# Patient Record
Sex: Male | Born: 1945 | Race: White | Hispanic: No | Marital: Married | Smoking: Former smoker
Health system: Southern US, Community
[De-identification: ages and names within clinical notes are randomized; demographics above are authoritative.]

## PROBLEM LIST (undated history)

## (undated) DIAGNOSIS — H25013 Cortical age-related cataract, bilateral: Secondary | ICD-10-CM

## (undated) DIAGNOSIS — J189 Pneumonia, unspecified organism: Secondary | ICD-10-CM

## (undated) DIAGNOSIS — M1990 Unspecified osteoarthritis, unspecified site: Secondary | ICD-10-CM

## (undated) DIAGNOSIS — J9811 Atelectasis: Secondary | ICD-10-CM

## (undated) DIAGNOSIS — E119 Type 2 diabetes mellitus without complications: Secondary | ICD-10-CM

## (undated) DIAGNOSIS — N189 Chronic kidney disease, unspecified: Secondary | ICD-10-CM

## (undated) DIAGNOSIS — E785 Hyperlipidemia, unspecified: Secondary | ICD-10-CM

## (undated) DIAGNOSIS — K5790 Diverticulosis of intestine, part unspecified, without perforation or abscess without bleeding: Secondary | ICD-10-CM

## (undated) DIAGNOSIS — B353 Tinea pedis: Secondary | ICD-10-CM

## (undated) DIAGNOSIS — N184 Chronic kidney disease, stage 4 (severe): Secondary | ICD-10-CM

## (undated) DIAGNOSIS — Z87442 Personal history of urinary calculi: Secondary | ICD-10-CM

## (undated) DIAGNOSIS — I10 Essential (primary) hypertension: Secondary | ICD-10-CM

## (undated) DIAGNOSIS — C44311 Basal cell carcinoma of skin of nose: Secondary | ICD-10-CM

## (undated) DIAGNOSIS — K219 Gastro-esophageal reflux disease without esophagitis: Secondary | ICD-10-CM

## (undated) DIAGNOSIS — L02212 Cutaneous abscess of back [any part, except buttock]: Secondary | ICD-10-CM

## (undated) HISTORY — PX: EYE SURGERY: SHX253

## (undated) HISTORY — PX: COLONOSCOPY: SHX174

## (undated) HISTORY — PX: VASECTOMY: SHX75

## (undated) HISTORY — PX: COLON SURGERY: SHX602

---

## 1968-01-31 HISTORY — PX: PILONIDAL CYST EXCISION: SHX744

## 2006-01-30 HISTORY — PX: PARTIAL COLECTOMY: SHX5273

## 2006-01-30 HISTORY — PX: APPENDECTOMY: SHX54

## 2006-06-07 ENCOUNTER — Ambulatory Visit: Payer: Self-pay | Admitting: Gastroenterology

## 2006-07-10 ENCOUNTER — Ambulatory Visit: Payer: Self-pay | Admitting: Surgery

## 2006-07-10 ENCOUNTER — Other Ambulatory Visit: Payer: Self-pay

## 2006-07-16 ENCOUNTER — Inpatient Hospital Stay: Payer: Self-pay | Admitting: Surgery

## 2007-08-26 ENCOUNTER — Ambulatory Visit: Payer: Self-pay | Admitting: Gastroenterology

## 2009-02-25 ENCOUNTER — Ambulatory Visit: Payer: Self-pay | Admitting: Gastroenterology

## 2009-09-28 ENCOUNTER — Ambulatory Visit: Payer: Self-pay | Admitting: Ophthalmology

## 2009-09-30 HISTORY — PX: CATARACT EXTRACTION: SUR2

## 2009-10-11 ENCOUNTER — Ambulatory Visit: Payer: Self-pay | Admitting: Ophthalmology

## 2010-05-01 HISTORY — PX: CATARACT EXTRACTION: SUR2

## 2010-05-09 ENCOUNTER — Ambulatory Visit: Payer: Self-pay | Admitting: Ophthalmology

## 2011-01-05 ENCOUNTER — Emergency Department: Payer: Self-pay | Admitting: Emergency Medicine

## 2011-04-17 ENCOUNTER — Ambulatory Visit: Payer: Self-pay | Admitting: Gastroenterology

## 2011-04-18 LAB — PATHOLOGY REPORT

## 2013-01-31 DIAGNOSIS — I10 Essential (primary) hypertension: Secondary | ICD-10-CM | POA: Diagnosis not present

## 2013-01-31 DIAGNOSIS — E119 Type 2 diabetes mellitus without complications: Secondary | ICD-10-CM | POA: Diagnosis not present

## 2013-02-07 DIAGNOSIS — I10 Essential (primary) hypertension: Secondary | ICD-10-CM | POA: Diagnosis not present

## 2013-02-08 ENCOUNTER — Emergency Department: Payer: Self-pay | Admitting: Emergency Medicine

## 2013-02-08 DIAGNOSIS — Z79899 Other long term (current) drug therapy: Secondary | ICD-10-CM | POA: Diagnosis not present

## 2013-02-08 DIAGNOSIS — Z7982 Long term (current) use of aspirin: Secondary | ICD-10-CM | POA: Diagnosis not present

## 2013-02-08 DIAGNOSIS — E119 Type 2 diabetes mellitus without complications: Secondary | ICD-10-CM | POA: Diagnosis not present

## 2013-02-08 DIAGNOSIS — Z888 Allergy status to other drugs, medicaments and biological substances status: Secondary | ICD-10-CM | POA: Diagnosis not present

## 2013-02-08 DIAGNOSIS — I10 Essential (primary) hypertension: Secondary | ICD-10-CM | POA: Diagnosis not present

## 2013-02-08 LAB — COMPREHENSIVE METABOLIC PANEL
ALK PHOS: 72 U/L
ANION GAP: 5 — AB (ref 7–16)
Albumin: 3.7 g/dL (ref 3.4–5.0)
BUN: 16 mg/dL (ref 7–18)
Bilirubin,Total: 0.3 mg/dL (ref 0.2–1.0)
CALCIUM: 8.9 mg/dL (ref 8.5–10.1)
CO2: 24 mmol/L (ref 21–32)
CREATININE: 1.04 mg/dL (ref 0.60–1.30)
Chloride: 107 mmol/L (ref 98–107)
Glucose: 179 mg/dL — ABNORMAL HIGH (ref 65–99)
Osmolality: 278 (ref 275–301)
Potassium: 4 mmol/L (ref 3.5–5.1)
SGOT(AST): 24 U/L (ref 15–37)
SGPT (ALT): 27 U/L (ref 12–78)
SODIUM: 136 mmol/L (ref 136–145)
Total Protein: 7.2 g/dL (ref 6.4–8.2)

## 2013-02-08 LAB — CBC WITH DIFFERENTIAL/PLATELET
Basophil #: 0 10*3/uL (ref 0.0–0.1)
Basophil %: 0.2 %
Eosinophil #: 0.2 10*3/uL (ref 0.0–0.7)
Eosinophil %: 1.8 %
HCT: 46.2 % (ref 40.0–52.0)
HGB: 16 g/dL (ref 13.0–18.0)
Lymphocyte #: 2 10*3/uL (ref 1.0–3.6)
Lymphocyte %: 18.5 %
MCH: 31.3 pg (ref 26.0–34.0)
MCHC: 34.5 g/dL (ref 32.0–36.0)
MCV: 91 fL (ref 80–100)
Monocyte #: 1 x10 3/mm (ref 0.2–1.0)
Monocyte %: 8.7 %
NEUTROS ABS: 7.7 10*3/uL — AB (ref 1.4–6.5)
NEUTROS PCT: 70.8 %
Platelet: 269 10*3/uL (ref 150–440)
RBC: 5.1 10*6/uL (ref 4.40–5.90)
RDW: 13.1 % (ref 11.5–14.5)
WBC: 10.9 10*3/uL — AB (ref 3.8–10.6)

## 2013-02-08 LAB — TROPONIN I

## 2013-02-08 LAB — CK TOTAL AND CKMB (NOT AT ARMC)
CK, TOTAL: 61 U/L (ref 35–232)
CK-MB: 0.9 ng/mL (ref 0.5–3.6)

## 2013-02-12 DIAGNOSIS — E119 Type 2 diabetes mellitus without complications: Secondary | ICD-10-CM | POA: Diagnosis not present

## 2013-02-12 DIAGNOSIS — I10 Essential (primary) hypertension: Secondary | ICD-10-CM | POA: Diagnosis not present

## 2013-02-27 DIAGNOSIS — I10 Essential (primary) hypertension: Secondary | ICD-10-CM | POA: Diagnosis not present

## 2013-02-27 DIAGNOSIS — R002 Palpitations: Secondary | ICD-10-CM | POA: Diagnosis not present

## 2013-03-26 DIAGNOSIS — E119 Type 2 diabetes mellitus without complications: Secondary | ICD-10-CM | POA: Diagnosis not present

## 2013-04-01 DIAGNOSIS — I10 Essential (primary) hypertension: Secondary | ICD-10-CM | POA: Diagnosis not present

## 2013-04-01 DIAGNOSIS — E119 Type 2 diabetes mellitus without complications: Secondary | ICD-10-CM | POA: Diagnosis not present

## 2013-05-14 DIAGNOSIS — I10 Essential (primary) hypertension: Secondary | ICD-10-CM | POA: Diagnosis not present

## 2013-05-14 DIAGNOSIS — Z0000 Encounter for general adult medical examination without abnormal findings: Secondary | ICD-10-CM | POA: Diagnosis not present

## 2013-05-14 DIAGNOSIS — E119 Type 2 diabetes mellitus without complications: Secondary | ICD-10-CM | POA: Diagnosis not present

## 2013-05-16 DIAGNOSIS — E119 Type 2 diabetes mellitus without complications: Secondary | ICD-10-CM | POA: Diagnosis not present

## 2013-05-21 DIAGNOSIS — Z0000 Encounter for general adult medical examination without abnormal findings: Secondary | ICD-10-CM | POA: Diagnosis not present

## 2013-05-21 DIAGNOSIS — I10 Essential (primary) hypertension: Secondary | ICD-10-CM | POA: Diagnosis not present

## 2013-05-21 DIAGNOSIS — R12 Heartburn: Secondary | ICD-10-CM | POA: Diagnosis not present

## 2013-05-21 DIAGNOSIS — E119 Type 2 diabetes mellitus without complications: Secondary | ICD-10-CM | POA: Diagnosis not present

## 2013-07-02 DIAGNOSIS — E119 Type 2 diabetes mellitus without complications: Secondary | ICD-10-CM | POA: Diagnosis not present

## 2013-07-09 DIAGNOSIS — R110 Nausea: Secondary | ICD-10-CM | POA: Diagnosis not present

## 2013-07-09 DIAGNOSIS — R109 Unspecified abdominal pain: Secondary | ICD-10-CM | POA: Diagnosis not present

## 2013-07-09 DIAGNOSIS — E119 Type 2 diabetes mellitus without complications: Secondary | ICD-10-CM | POA: Diagnosis not present

## 2013-08-06 DIAGNOSIS — I10 Essential (primary) hypertension: Secondary | ICD-10-CM | POA: Diagnosis not present

## 2013-08-06 DIAGNOSIS — R21 Rash and other nonspecific skin eruption: Secondary | ICD-10-CM | POA: Diagnosis not present

## 2013-08-06 DIAGNOSIS — E119 Type 2 diabetes mellitus without complications: Secondary | ICD-10-CM | POA: Diagnosis not present

## 2013-10-03 DIAGNOSIS — E119 Type 2 diabetes mellitus without complications: Secondary | ICD-10-CM | POA: Diagnosis not present

## 2013-10-10 DIAGNOSIS — IMO0001 Reserved for inherently not codable concepts without codable children: Secondary | ICD-10-CM | POA: Diagnosis not present

## 2013-12-08 DIAGNOSIS — E119 Type 2 diabetes mellitus without complications: Secondary | ICD-10-CM | POA: Diagnosis not present

## 2013-12-08 DIAGNOSIS — I10 Essential (primary) hypertension: Secondary | ICD-10-CM | POA: Diagnosis not present

## 2013-12-15 DIAGNOSIS — Z23 Encounter for immunization: Secondary | ICD-10-CM | POA: Diagnosis not present

## 2013-12-30 DIAGNOSIS — E119 Type 2 diabetes mellitus without complications: Secondary | ICD-10-CM | POA: Diagnosis not present

## 2013-12-30 DIAGNOSIS — I10 Essential (primary) hypertension: Secondary | ICD-10-CM | POA: Diagnosis not present

## 2014-01-12 DIAGNOSIS — N183 Chronic kidney disease, stage 3 (moderate): Secondary | ICD-10-CM | POA: Diagnosis not present

## 2014-01-12 DIAGNOSIS — E119 Type 2 diabetes mellitus without complications: Secondary | ICD-10-CM | POA: Diagnosis not present

## 2014-01-12 DIAGNOSIS — I10 Essential (primary) hypertension: Secondary | ICD-10-CM | POA: Diagnosis not present

## 2014-01-12 DIAGNOSIS — Z794 Long term (current) use of insulin: Secondary | ICD-10-CM | POA: Diagnosis not present

## 2014-01-12 DIAGNOSIS — E1165 Type 2 diabetes mellitus with hyperglycemia: Secondary | ICD-10-CM | POA: Diagnosis not present

## 2014-04-10 DIAGNOSIS — E1165 Type 2 diabetes mellitus with hyperglycemia: Secondary | ICD-10-CM | POA: Diagnosis not present

## 2014-04-17 DIAGNOSIS — E119 Type 2 diabetes mellitus without complications: Secondary | ICD-10-CM | POA: Diagnosis not present

## 2014-06-25 DIAGNOSIS — E119 Type 2 diabetes mellitus without complications: Secondary | ICD-10-CM | POA: Diagnosis not present

## 2014-07-10 DIAGNOSIS — E119 Type 2 diabetes mellitus without complications: Secondary | ICD-10-CM | POA: Diagnosis not present

## 2014-07-10 DIAGNOSIS — I10 Essential (primary) hypertension: Secondary | ICD-10-CM | POA: Diagnosis not present

## 2014-07-15 DIAGNOSIS — I10 Essential (primary) hypertension: Secondary | ICD-10-CM | POA: Diagnosis not present

## 2014-07-15 DIAGNOSIS — R635 Abnormal weight gain: Secondary | ICD-10-CM | POA: Diagnosis not present

## 2014-07-15 DIAGNOSIS — E119 Type 2 diabetes mellitus without complications: Secondary | ICD-10-CM | POA: Diagnosis not present

## 2014-07-15 DIAGNOSIS — Z794 Long term (current) use of insulin: Secondary | ICD-10-CM | POA: Diagnosis not present

## 2014-07-17 DIAGNOSIS — E119 Type 2 diabetes mellitus without complications: Secondary | ICD-10-CM | POA: Diagnosis not present

## 2014-07-17 DIAGNOSIS — I10 Essential (primary) hypertension: Secondary | ICD-10-CM | POA: Diagnosis not present

## 2014-07-17 DIAGNOSIS — Z0000 Encounter for general adult medical examination without abnormal findings: Secondary | ICD-10-CM | POA: Diagnosis not present

## 2014-07-17 DIAGNOSIS — E780 Pure hypercholesterolemia: Secondary | ICD-10-CM | POA: Diagnosis not present

## 2014-07-17 DIAGNOSIS — Z23 Encounter for immunization: Secondary | ICD-10-CM | POA: Diagnosis not present

## 2014-07-17 DIAGNOSIS — N183 Chronic kidney disease, stage 3 (moderate): Secondary | ICD-10-CM | POA: Diagnosis not present

## 2014-10-08 DIAGNOSIS — H43812 Vitreous degeneration, left eye: Secondary | ICD-10-CM | POA: Diagnosis not present

## 2014-11-12 DIAGNOSIS — E119 Type 2 diabetes mellitus without complications: Secondary | ICD-10-CM | POA: Diagnosis not present

## 2014-11-20 DIAGNOSIS — E119 Type 2 diabetes mellitus without complications: Secondary | ICD-10-CM | POA: Diagnosis not present

## 2014-11-20 DIAGNOSIS — Z794 Long term (current) use of insulin: Secondary | ICD-10-CM | POA: Diagnosis not present

## 2014-11-20 DIAGNOSIS — Z23 Encounter for immunization: Secondary | ICD-10-CM | POA: Diagnosis not present

## 2014-11-20 DIAGNOSIS — I10 Essential (primary) hypertension: Secondary | ICD-10-CM | POA: Diagnosis not present

## 2014-12-11 DIAGNOSIS — H43812 Vitreous degeneration, left eye: Secondary | ICD-10-CM | POA: Diagnosis not present

## 2015-01-01 DIAGNOSIS — D1801 Hemangioma of skin and subcutaneous tissue: Secondary | ICD-10-CM | POA: Diagnosis not present

## 2015-01-01 DIAGNOSIS — Q828 Other specified congenital malformations of skin: Secondary | ICD-10-CM | POA: Diagnosis not present

## 2015-01-01 DIAGNOSIS — L821 Other seborrheic keratosis: Secondary | ICD-10-CM | POA: Diagnosis not present

## 2015-01-01 DIAGNOSIS — D485 Neoplasm of uncertain behavior of skin: Secondary | ICD-10-CM | POA: Diagnosis not present

## 2015-01-01 DIAGNOSIS — L570 Actinic keratosis: Secondary | ICD-10-CM | POA: Diagnosis not present

## 2015-01-08 DIAGNOSIS — I10 Essential (primary) hypertension: Secondary | ICD-10-CM | POA: Diagnosis not present

## 2015-01-08 DIAGNOSIS — E119 Type 2 diabetes mellitus without complications: Secondary | ICD-10-CM | POA: Diagnosis not present

## 2015-01-08 DIAGNOSIS — N183 Chronic kidney disease, stage 3 (moderate): Secondary | ICD-10-CM | POA: Diagnosis not present

## 2015-01-15 DIAGNOSIS — M7071 Other bursitis of hip, right hip: Secondary | ICD-10-CM | POA: Diagnosis not present

## 2015-01-15 DIAGNOSIS — E1122 Type 2 diabetes mellitus with diabetic chronic kidney disease: Secondary | ICD-10-CM | POA: Diagnosis not present

## 2015-01-15 DIAGNOSIS — E7800 Pure hypercholesterolemia, unspecified: Secondary | ICD-10-CM | POA: Diagnosis not present

## 2015-01-15 DIAGNOSIS — N183 Chronic kidney disease, stage 3 (moderate): Secondary | ICD-10-CM | POA: Diagnosis not present

## 2015-01-15 DIAGNOSIS — I10 Essential (primary) hypertension: Secondary | ICD-10-CM | POA: Diagnosis not present

## 2015-01-15 DIAGNOSIS — Z794 Long term (current) use of insulin: Secondary | ICD-10-CM | POA: Diagnosis not present

## 2015-01-27 DIAGNOSIS — M25552 Pain in left hip: Secondary | ICD-10-CM | POA: Diagnosis not present

## 2015-01-27 DIAGNOSIS — M25512 Pain in left shoulder: Secondary | ICD-10-CM | POA: Diagnosis not present

## 2015-02-12 DIAGNOSIS — N183 Chronic kidney disease, stage 3 (moderate): Secondary | ICD-10-CM | POA: Diagnosis not present

## 2015-02-12 DIAGNOSIS — R809 Proteinuria, unspecified: Secondary | ICD-10-CM | POA: Diagnosis not present

## 2015-02-12 DIAGNOSIS — I10 Essential (primary) hypertension: Secondary | ICD-10-CM | POA: Diagnosis not present

## 2015-02-12 DIAGNOSIS — E1122 Type 2 diabetes mellitus with diabetic chronic kidney disease: Secondary | ICD-10-CM | POA: Diagnosis not present

## 2015-02-24 DIAGNOSIS — N183 Chronic kidney disease, stage 3 (moderate): Secondary | ICD-10-CM | POA: Diagnosis not present

## 2015-03-19 DIAGNOSIS — E119 Type 2 diabetes mellitus without complications: Secondary | ICD-10-CM | POA: Diagnosis not present

## 2015-03-19 DIAGNOSIS — Z794 Long term (current) use of insulin: Secondary | ICD-10-CM | POA: Diagnosis not present

## 2015-03-19 DIAGNOSIS — I10 Essential (primary) hypertension: Secondary | ICD-10-CM | POA: Diagnosis not present

## 2015-03-19 DIAGNOSIS — R809 Proteinuria, unspecified: Secondary | ICD-10-CM | POA: Diagnosis not present

## 2015-03-19 DIAGNOSIS — N183 Chronic kidney disease, stage 3 (moderate): Secondary | ICD-10-CM | POA: Diagnosis not present

## 2015-03-19 DIAGNOSIS — E1122 Type 2 diabetes mellitus with diabetic chronic kidney disease: Secondary | ICD-10-CM | POA: Diagnosis not present

## 2015-03-26 DIAGNOSIS — Z794 Long term (current) use of insulin: Secondary | ICD-10-CM | POA: Diagnosis not present

## 2015-03-26 DIAGNOSIS — E1122 Type 2 diabetes mellitus with diabetic chronic kidney disease: Secondary | ICD-10-CM | POA: Diagnosis not present

## 2015-03-26 DIAGNOSIS — I10 Essential (primary) hypertension: Secondary | ICD-10-CM | POA: Diagnosis not present

## 2015-03-26 DIAGNOSIS — N183 Chronic kidney disease, stage 3 (moderate): Secondary | ICD-10-CM | POA: Diagnosis not present

## 2015-04-02 DIAGNOSIS — N183 Chronic kidney disease, stage 3 (moderate): Secondary | ICD-10-CM | POA: Diagnosis not present

## 2015-04-12 DIAGNOSIS — L565 Disseminated superficial actinic porokeratosis (DSAP): Secondary | ICD-10-CM | POA: Diagnosis not present

## 2015-07-06 DIAGNOSIS — I10 Essential (primary) hypertension: Secondary | ICD-10-CM | POA: Diagnosis not present

## 2015-07-06 DIAGNOSIS — N183 Chronic kidney disease, stage 3 (moderate): Secondary | ICD-10-CM | POA: Diagnosis not present

## 2015-07-06 DIAGNOSIS — K529 Noninfective gastroenteritis and colitis, unspecified: Secondary | ICD-10-CM | POA: Diagnosis not present

## 2015-07-06 DIAGNOSIS — M7061 Trochanteric bursitis, right hip: Secondary | ICD-10-CM | POA: Diagnosis not present

## 2015-07-06 DIAGNOSIS — M1611 Unilateral primary osteoarthritis, right hip: Secondary | ICD-10-CM | POA: Diagnosis not present

## 2015-07-06 DIAGNOSIS — R252 Cramp and spasm: Secondary | ICD-10-CM | POA: Diagnosis not present

## 2015-07-09 DIAGNOSIS — E1122 Type 2 diabetes mellitus with diabetic chronic kidney disease: Secondary | ICD-10-CM | POA: Diagnosis not present

## 2015-07-09 DIAGNOSIS — E559 Vitamin D deficiency, unspecified: Secondary | ICD-10-CM | POA: Diagnosis not present

## 2015-07-09 DIAGNOSIS — R809 Proteinuria, unspecified: Secondary | ICD-10-CM | POA: Diagnosis not present

## 2015-07-09 DIAGNOSIS — N183 Chronic kidney disease, stage 3 (moderate): Secondary | ICD-10-CM | POA: Diagnosis not present

## 2015-07-16 DIAGNOSIS — E7800 Pure hypercholesterolemia, unspecified: Secondary | ICD-10-CM | POA: Diagnosis not present

## 2015-07-16 DIAGNOSIS — E1122 Type 2 diabetes mellitus with diabetic chronic kidney disease: Secondary | ICD-10-CM | POA: Diagnosis not present

## 2015-07-16 DIAGNOSIS — N183 Chronic kidney disease, stage 3 (moderate): Secondary | ICD-10-CM | POA: Diagnosis not present

## 2015-07-16 DIAGNOSIS — Z794 Long term (current) use of insulin: Secondary | ICD-10-CM | POA: Diagnosis not present

## 2015-07-16 DIAGNOSIS — I10 Essential (primary) hypertension: Secondary | ICD-10-CM | POA: Diagnosis not present

## 2015-07-23 DIAGNOSIS — Z794 Long term (current) use of insulin: Secondary | ICD-10-CM | POA: Diagnosis not present

## 2015-07-23 DIAGNOSIS — Z0000 Encounter for general adult medical examination without abnormal findings: Secondary | ICD-10-CM | POA: Diagnosis not present

## 2015-07-23 DIAGNOSIS — N183 Chronic kidney disease, stage 3 (moderate): Secondary | ICD-10-CM | POA: Diagnosis not present

## 2015-07-23 DIAGNOSIS — M7061 Trochanteric bursitis, right hip: Secondary | ICD-10-CM | POA: Diagnosis not present

## 2015-07-23 DIAGNOSIS — Q828 Other specified congenital malformations of skin: Secondary | ICD-10-CM | POA: Insufficient documentation

## 2015-07-23 DIAGNOSIS — I10 Essential (primary) hypertension: Secondary | ICD-10-CM | POA: Diagnosis not present

## 2015-07-23 DIAGNOSIS — Z1159 Encounter for screening for other viral diseases: Secondary | ICD-10-CM | POA: Diagnosis not present

## 2015-07-23 DIAGNOSIS — K529 Noninfective gastroenteritis and colitis, unspecified: Secondary | ICD-10-CM | POA: Diagnosis not present

## 2015-07-23 DIAGNOSIS — E1122 Type 2 diabetes mellitus with diabetic chronic kidney disease: Secondary | ICD-10-CM | POA: Diagnosis not present

## 2015-07-23 DIAGNOSIS — E7800 Pure hypercholesterolemia, unspecified: Secondary | ICD-10-CM | POA: Diagnosis not present

## 2015-09-17 DIAGNOSIS — E1122 Type 2 diabetes mellitus with diabetic chronic kidney disease: Secondary | ICD-10-CM | POA: Diagnosis not present

## 2015-09-17 DIAGNOSIS — N183 Chronic kidney disease, stage 3 (moderate): Secondary | ICD-10-CM | POA: Diagnosis not present

## 2015-09-17 DIAGNOSIS — Z794 Long term (current) use of insulin: Secondary | ICD-10-CM | POA: Diagnosis not present

## 2015-10-01 DIAGNOSIS — Z794 Long term (current) use of insulin: Secondary | ICD-10-CM | POA: Diagnosis not present

## 2015-10-01 DIAGNOSIS — E1122 Type 2 diabetes mellitus with diabetic chronic kidney disease: Secondary | ICD-10-CM | POA: Diagnosis not present

## 2015-10-01 DIAGNOSIS — I10 Essential (primary) hypertension: Secondary | ICD-10-CM | POA: Diagnosis not present

## 2015-10-01 DIAGNOSIS — N183 Chronic kidney disease, stage 3 (moderate): Secondary | ICD-10-CM | POA: Diagnosis not present

## 2015-10-13 DIAGNOSIS — Q828 Other specified congenital malformations of skin: Secondary | ICD-10-CM | POA: Diagnosis not present

## 2015-10-13 DIAGNOSIS — D485 Neoplasm of uncertain behavior of skin: Secondary | ICD-10-CM | POA: Diagnosis not present

## 2015-10-13 DIAGNOSIS — L565 Disseminated superficial actinic porokeratosis (DSAP): Secondary | ICD-10-CM | POA: Diagnosis not present

## 2015-10-22 DIAGNOSIS — E1122 Type 2 diabetes mellitus with diabetic chronic kidney disease: Secondary | ICD-10-CM | POA: Diagnosis not present

## 2015-10-22 DIAGNOSIS — N183 Chronic kidney disease, stage 3 (moderate): Secondary | ICD-10-CM | POA: Diagnosis not present

## 2015-10-22 DIAGNOSIS — I10 Essential (primary) hypertension: Secondary | ICD-10-CM | POA: Diagnosis not present

## 2015-10-22 DIAGNOSIS — E559 Vitamin D deficiency, unspecified: Secondary | ICD-10-CM | POA: Diagnosis not present

## 2016-01-21 DIAGNOSIS — Z794 Long term (current) use of insulin: Secondary | ICD-10-CM | POA: Diagnosis not present

## 2016-01-21 DIAGNOSIS — E1122 Type 2 diabetes mellitus with diabetic chronic kidney disease: Secondary | ICD-10-CM | POA: Diagnosis not present

## 2016-01-21 DIAGNOSIS — Z1159 Encounter for screening for other viral diseases: Secondary | ICD-10-CM | POA: Diagnosis not present

## 2016-01-21 DIAGNOSIS — N183 Chronic kidney disease, stage 3 (moderate): Secondary | ICD-10-CM | POA: Diagnosis not present

## 2016-01-21 DIAGNOSIS — I10 Essential (primary) hypertension: Secondary | ICD-10-CM | POA: Diagnosis not present

## 2016-01-28 DIAGNOSIS — Z794 Long term (current) use of insulin: Secondary | ICD-10-CM | POA: Diagnosis not present

## 2016-01-28 DIAGNOSIS — E1122 Type 2 diabetes mellitus with diabetic chronic kidney disease: Secondary | ICD-10-CM | POA: Diagnosis not present

## 2016-01-28 DIAGNOSIS — N183 Chronic kidney disease, stage 3 (moderate): Secondary | ICD-10-CM | POA: Diagnosis not present

## 2016-01-28 DIAGNOSIS — I10 Essential (primary) hypertension: Secondary | ICD-10-CM | POA: Diagnosis not present

## 2016-01-28 DIAGNOSIS — E7800 Pure hypercholesterolemia, unspecified: Secondary | ICD-10-CM | POA: Diagnosis not present

## 2016-01-28 DIAGNOSIS — L723 Sebaceous cyst: Secondary | ICD-10-CM | POA: Diagnosis not present

## 2016-02-24 DIAGNOSIS — I10 Essential (primary) hypertension: Secondary | ICD-10-CM | POA: Diagnosis not present

## 2016-02-24 DIAGNOSIS — E559 Vitamin D deficiency, unspecified: Secondary | ICD-10-CM | POA: Diagnosis not present

## 2016-02-24 DIAGNOSIS — E1122 Type 2 diabetes mellitus with diabetic chronic kidney disease: Secondary | ICD-10-CM | POA: Diagnosis not present

## 2016-02-24 DIAGNOSIS — N183 Chronic kidney disease, stage 3 (moderate): Secondary | ICD-10-CM | POA: Diagnosis not present

## 2016-03-24 DIAGNOSIS — N183 Chronic kidney disease, stage 3 (moderate): Secondary | ICD-10-CM | POA: Diagnosis not present

## 2016-03-24 DIAGNOSIS — E1122 Type 2 diabetes mellitus with diabetic chronic kidney disease: Secondary | ICD-10-CM | POA: Diagnosis not present

## 2016-03-24 DIAGNOSIS — Z794 Long term (current) use of insulin: Secondary | ICD-10-CM | POA: Diagnosis not present

## 2016-03-31 DIAGNOSIS — I10 Essential (primary) hypertension: Secondary | ICD-10-CM | POA: Diagnosis not present

## 2016-03-31 DIAGNOSIS — E1122 Type 2 diabetes mellitus with diabetic chronic kidney disease: Secondary | ICD-10-CM | POA: Diagnosis not present

## 2016-03-31 DIAGNOSIS — N183 Chronic kidney disease, stage 3 (moderate): Secondary | ICD-10-CM | POA: Diagnosis not present

## 2016-03-31 DIAGNOSIS — E1142 Type 2 diabetes mellitus with diabetic polyneuropathy: Secondary | ICD-10-CM | POA: Diagnosis not present

## 2016-03-31 DIAGNOSIS — Z794 Long term (current) use of insulin: Secondary | ICD-10-CM | POA: Diagnosis not present

## 2016-04-12 DIAGNOSIS — B353 Tinea pedis: Secondary | ICD-10-CM | POA: Diagnosis not present

## 2016-04-12 DIAGNOSIS — L565 Disseminated superficial actinic porokeratosis (DSAP): Secondary | ICD-10-CM | POA: Diagnosis not present

## 2016-04-12 DIAGNOSIS — B351 Tinea unguium: Secondary | ICD-10-CM | POA: Diagnosis not present

## 2016-04-12 DIAGNOSIS — D485 Neoplasm of uncertain behavior of skin: Secondary | ICD-10-CM | POA: Diagnosis not present

## 2016-04-12 DIAGNOSIS — L723 Sebaceous cyst: Secondary | ICD-10-CM | POA: Diagnosis not present

## 2016-04-12 DIAGNOSIS — D0461 Carcinoma in situ of skin of right upper limb, including shoulder: Secondary | ICD-10-CM | POA: Diagnosis not present

## 2016-04-12 DIAGNOSIS — L219 Seborrheic dermatitis, unspecified: Secondary | ICD-10-CM | POA: Diagnosis not present

## 2016-04-12 DIAGNOSIS — L570 Actinic keratosis: Secondary | ICD-10-CM | POA: Diagnosis not present

## 2016-04-21 DIAGNOSIS — D0461 Carcinoma in situ of skin of right upper limb, including shoulder: Secondary | ICD-10-CM | POA: Diagnosis not present

## 2016-05-15 DIAGNOSIS — Z86010 Personal history of colonic polyps: Secondary | ICD-10-CM | POA: Diagnosis not present

## 2016-05-15 DIAGNOSIS — R197 Diarrhea, unspecified: Secondary | ICD-10-CM | POA: Diagnosis not present

## 2016-06-09 DIAGNOSIS — H35372 Puckering of macula, left eye: Secondary | ICD-10-CM | POA: Diagnosis not present

## 2016-06-21 DIAGNOSIS — J0100 Acute maxillary sinusitis, unspecified: Secondary | ICD-10-CM | POA: Diagnosis not present

## 2016-06-21 DIAGNOSIS — N183 Chronic kidney disease, stage 3 (moderate): Secondary | ICD-10-CM | POA: Diagnosis not present

## 2016-06-21 DIAGNOSIS — M7061 Trochanteric bursitis, right hip: Secondary | ICD-10-CM | POA: Diagnosis not present

## 2016-06-23 DIAGNOSIS — I10 Essential (primary) hypertension: Secondary | ICD-10-CM | POA: Diagnosis not present

## 2016-06-23 DIAGNOSIS — E1122 Type 2 diabetes mellitus with diabetic chronic kidney disease: Secondary | ICD-10-CM | POA: Diagnosis not present

## 2016-06-23 DIAGNOSIS — E559 Vitamin D deficiency, unspecified: Secondary | ICD-10-CM | POA: Diagnosis not present

## 2016-06-23 DIAGNOSIS — N183 Chronic kidney disease, stage 3 (moderate): Secondary | ICD-10-CM | POA: Diagnosis not present

## 2016-07-21 DIAGNOSIS — N183 Chronic kidney disease, stage 3 (moderate): Secondary | ICD-10-CM | POA: Diagnosis not present

## 2016-07-21 DIAGNOSIS — E7800 Pure hypercholesterolemia, unspecified: Secondary | ICD-10-CM | POA: Diagnosis not present

## 2016-07-31 DIAGNOSIS — Z794 Long term (current) use of insulin: Secondary | ICD-10-CM | POA: Diagnosis not present

## 2016-07-31 DIAGNOSIS — M79671 Pain in right foot: Secondary | ICD-10-CM | POA: Diagnosis not present

## 2016-07-31 DIAGNOSIS — E1122 Type 2 diabetes mellitus with diabetic chronic kidney disease: Secondary | ICD-10-CM | POA: Diagnosis not present

## 2016-07-31 DIAGNOSIS — Z0000 Encounter for general adult medical examination without abnormal findings: Secondary | ICD-10-CM | POA: Diagnosis not present

## 2016-07-31 DIAGNOSIS — N183 Chronic kidney disease, stage 3 (moderate): Secondary | ICD-10-CM | POA: Diagnosis not present

## 2016-07-31 DIAGNOSIS — I10 Essential (primary) hypertension: Secondary | ICD-10-CM | POA: Diagnosis not present

## 2016-07-31 DIAGNOSIS — I739 Peripheral vascular disease, unspecified: Secondary | ICD-10-CM | POA: Diagnosis not present

## 2016-07-31 DIAGNOSIS — E7800 Pure hypercholesterolemia, unspecified: Secondary | ICD-10-CM | POA: Diagnosis not present

## 2016-07-31 DIAGNOSIS — M79672 Pain in left foot: Secondary | ICD-10-CM | POA: Diagnosis not present

## 2016-09-15 ENCOUNTER — Encounter: Payer: Self-pay | Admitting: *Deleted

## 2016-09-18 ENCOUNTER — Ambulatory Visit
Admission: RE | Admit: 2016-09-18 | Discharge: 2016-09-18 | Disposition: A | Discharge status: Home / Self Care | Payer: Medicare Other | Source: Ambulatory Visit | Attending: Gastroenterology | Admitting: Gastroenterology

## 2016-09-18 ENCOUNTER — Encounter: Payer: Self-pay | Admitting: *Deleted

## 2016-09-18 ENCOUNTER — Encounter
Admission: RE | Disposition: A | Discharge status: Home / Self Care | Payer: Self-pay | Source: Ambulatory Visit | Attending: Gastroenterology

## 2016-09-18 ENCOUNTER — Ambulatory Visit: Payer: Medicare Other | Admitting: Anesthesiology

## 2016-09-18 DIAGNOSIS — K219 Gastro-esophageal reflux disease without esophagitis: Secondary | ICD-10-CM | POA: Diagnosis not present

## 2016-09-18 DIAGNOSIS — Z980 Intestinal bypass and anastomosis status: Secondary | ICD-10-CM | POA: Insufficient documentation

## 2016-09-18 DIAGNOSIS — K648 Other hemorrhoids: Secondary | ICD-10-CM | POA: Diagnosis not present

## 2016-09-18 DIAGNOSIS — K529 Noninfective gastroenteritis and colitis, unspecified: Secondary | ICD-10-CM | POA: Diagnosis not present

## 2016-09-18 DIAGNOSIS — B353 Tinea pedis: Secondary | ICD-10-CM | POA: Insufficient documentation

## 2016-09-18 DIAGNOSIS — Z87442 Personal history of urinary calculi: Secondary | ICD-10-CM | POA: Diagnosis not present

## 2016-09-18 DIAGNOSIS — E785 Hyperlipidemia, unspecified: Secondary | ICD-10-CM | POA: Diagnosis not present

## 2016-09-18 DIAGNOSIS — Z794 Long term (current) use of insulin: Secondary | ICD-10-CM | POA: Diagnosis not present

## 2016-09-18 DIAGNOSIS — K5730 Diverticulosis of large intestine without perforation or abscess without bleeding: Secondary | ICD-10-CM | POA: Diagnosis not present

## 2016-09-18 DIAGNOSIS — Z87891 Personal history of nicotine dependence: Secondary | ICD-10-CM | POA: Insufficient documentation

## 2016-09-18 DIAGNOSIS — K641 Second degree hemorrhoids: Secondary | ICD-10-CM | POA: Insufficient documentation

## 2016-09-18 DIAGNOSIS — E119 Type 2 diabetes mellitus without complications: Secondary | ICD-10-CM | POA: Insufficient documentation

## 2016-09-18 DIAGNOSIS — Z86010 Personal history of colonic polyps: Secondary | ICD-10-CM | POA: Diagnosis not present

## 2016-09-18 DIAGNOSIS — I10 Essential (primary) hypertension: Secondary | ICD-10-CM | POA: Insufficient documentation

## 2016-09-18 DIAGNOSIS — R197 Diarrhea, unspecified: Secondary | ICD-10-CM | POA: Diagnosis not present

## 2016-09-18 HISTORY — DX: Hyperlipidemia, unspecified: E78.5

## 2016-09-18 HISTORY — DX: Chronic kidney disease, unspecified: N18.9

## 2016-09-18 HISTORY — PX: COLONOSCOPY WITH PROPOFOL: SHX5780

## 2016-09-18 HISTORY — DX: Essential (primary) hypertension: I10

## 2016-09-18 HISTORY — DX: Tinea pedis: B35.3

## 2016-09-18 HISTORY — DX: Type 2 diabetes mellitus without complications: E11.9

## 2016-09-18 LAB — GLUCOSE, CAPILLARY: Glucose-Capillary: 192 mg/dL — ABNORMAL HIGH (ref 65–99)

## 2016-09-18 SURGERY — COLONOSCOPY WITH PROPOFOL
Anesthesia: General

## 2016-09-18 MED ORDER — SODIUM CHLORIDE 0.9 % IV SOLN
INTRAVENOUS | Status: DC
  Administered 2016-09-18: 14:00:00 via INTRAVENOUS

## 2016-09-18 MED ORDER — PHENYLEPHRINE HCL 10 MG/ML IJ SOLN
INTRAMUSCULAR | Status: DC | PRN
  Administered 2016-09-18: 100 ug via INTRAVENOUS

## 2016-09-18 MED ORDER — PROPOFOL 10 MG/ML IV BOLUS
INTRAVENOUS | Status: DC | PRN
  Administered 2016-09-18: 60 mg via INTRAVENOUS

## 2016-09-18 MED ORDER — PROPOFOL 500 MG/50ML IV EMUL
INTRAVENOUS | Status: DC | PRN
  Administered 2016-09-18: 150 ug/kg/min via INTRAVENOUS

## 2016-09-18 MED ORDER — PROPOFOL 500 MG/50ML IV EMUL
INTRAVENOUS | Status: AC
  Filled 2016-09-18: qty 50

## 2016-09-18 MED ORDER — SODIUM CHLORIDE 0.9 % IV SOLN: INTRAVENOUS | Status: DC

## 2016-09-18 NOTE — Anesthesia Preprocedure Evaluation (Signed)
Anesthesia Evaluation  Patient identified by MRN, date of birth, ID band Patient awake    Reviewed: Allergy & Precautions, H&P , NPO status , Patient's Chart, lab work & pertinent test results, reviewed documented beta blocker date and time   History of Anesthesia Complications Negative for: history of anesthetic complications  Airway Mallampati: I  TM Distance: >3 FB Neck ROM: full    Dental  (+) Dental Advidsory Given, Teeth Intact, Missing   Pulmonary neg pulmonary ROS, former smoker,           Cardiovascular Exercise Tolerance: Good hypertension, (-) angina(-) CAD, (-) Past MI, (-) Cardiac Stents and (-) CABG (-) dysrhythmias (-) Valvular Problems/Murmurs     Neuro/Psych negative neurological ROS  negative psych ROS   GI/Hepatic Neg liver ROS, GERD  ,  Endo/Other  diabetes, Well Controlled, Insulin Dependent, Oral Hypoglycemic Agents  Renal/GU Renal disease (kidney stones)  negative genitourinary   Musculoskeletal   Abdominal   Peds  Hematology negative hematology ROS (+)   Anesthesia Other Findings Past Medical History: No date: Chronic kidney disease     Comment:  kidney stones No date: Diabetes mellitus without complication (HCC) No date: Hyperlipidemia No date: Hypertension No date: Tinea pedis   Reproductive/Obstetrics negative OB ROS                             Anesthesia Physical Anesthesia Plan  ASA: III  Anesthesia Plan: General   Post-op Pain Management:    Induction: Intravenous  PONV Risk Score and Plan: 2 and Propofol infusion  Airway Management Planned: Natural Airway and Nasal Cannula  Additional Equipment:   Intra-op Plan:   Post-operative Plan:   Informed Consent: I have reviewed the patients History and Physical, chart, labs and discussed the procedure including the risks, benefits and alternatives for the proposed anesthesia with the patient or  authorized representative who has indicated his/her understanding and acceptance.   Dental Advisory Given  Plan Discussed with: Anesthesiologist, CRNA and Surgeon  Anesthesia Plan Comments:         Anesthesia Quick Evaluation

## 2016-09-18 NOTE — H&P (Signed)
Outpatient short stay form Pre-procedure 09/18/2016 2:11 PM Lollie Sails MD  Primary Physician: Dr. Adrian Prows  Reason for visit:  Colonoscopy  History of present illness:  Patient is a 71 year old male with a personal history of adenomatous colon polyps. He had a large polyp that could not be resected endoscopically removed by a right hemicolectomy several years ago. He continues to have some issues with loose stool although he does take some Questran. He tolerated his prep well. He takes no aspirin or blood thinning agents.    Current Facility-Administered Medications:  .  0.9 %  sodium chloride infusion, , Intravenous, Continuous, Lollie Sails, MD, Last Rate: 20 mL/hr at 09/18/16 1400 .  0.9 %  sodium chloride infusion, , Intravenous, Continuous, Lollie Sails, MD  Prescriptions Prior to Admission  Medication Sig Dispense Refill Last Dose  . amLODipine (NORVASC) 2.5 MG tablet Take 2.5 mg by mouth daily.   09/18/2016 at 0530  . Cholecalciferol 2000 units CAPS Take 2,000 Units by mouth daily.   Past Week at Unknown time  . cholestyramine (QUESTRAN) 4 GM/DOSE powder Take by mouth 2 (two) times daily with a meal.   Past Week at Unknown time  . glipiZIDE (GLUCOTROL XL) 10 MG 24 hr tablet Take 10 mg by mouth at bedtime.   Past Week at Unknown time  . hydrochlorothiazide (HYDRODIURIL) 12.5 MG tablet Take 12.5 mg by mouth daily.   09/18/2016 at 0530  . insulin aspart (NOVOLOG) 100 UNIT/ML injection Inject 26 Units into the skin AC breakfast. take 26 units before breakfast and 22 units before supper   09/17/2016 at Unknown time  . lisinopril (PRINIVIL,ZESTRIL) 40 MG tablet Take 40 mg by mouth daily. Take 20 mg twice a day   09/18/2016 at 0530  . metFORMIN (GLUCOPHAGE-XR) 500 MG 24 hr tablet Take 1,000 mg by mouth 2 (two) times daily.   Past Week at Unknown time  . metoprolol succinate (TOPROL-XL) 100 MG 24 hr tablet Take 100 mg by mouth daily. Take with or immediately following a  meal.   09/18/2016 at 0530  . pantoprazole (PROTONIX) 40 MG tablet Take 40 mg by mouth daily.   Past Week at Unknown time     Allergies  Allergen Reactions  . Demerol [Meperidine]      Past Medical History:  Diagnosis Date  . Chronic kidney disease    kidney stones  . Diabetes mellitus without complication ([place])   . Hyperlipidemia   . Hypertension   . Tinea pedis     Review of systems:      Physical Exam    Heart and lungs: Regular rate and rhythm without rub or gallop, lungs are bilaterally clear.    HEENT: Normocephalic atraumatic eyes are anicteric    Other:     Pertinant exam for procedure: Soft nontender nondistended bowel sounds positive normoactive.    Planned proceedures: Colonoscopy and indicated procedures. I have discussed the risks benefits and complications of procedures to include not limited to bleeding, infection, perforation and the risk of sedation and the patient wishes to proceed.    Lollie Sails, MD Gastroenterology 09/18/2016  2:11 PM

## 2016-09-18 NOTE — Op Note (Addendum)
[facility] Gastroenterology Patient Name: Thomas Sampson Procedure Date: 09/18/2016 2:10 PM MRN: [id number] Account #: [id number] Date of Birth: [birth year] Admit Type: Outpatient Age: 71 Room: [facility] ENDO ROOM 1 Gender: Male Note Status: Supervisor Override Procedure:            Colonoscopy Indications:          Chronic diarrhea, Personal history of colonic polyps Providers:            Lollie Sails, MD Referring MD:         Adrian Prows (Referring MD) Medicines:            Monitored Anesthesia Care Complications:        No immediate complications. Procedure:            Pre-Anesthesia Assessment:                       - ASA Grade Assessment: II - A patient with mild                        systemic disease.                       After obtaining informed consent, the colonoscope was                        passed under direct vision. Throughout the procedure,                        the patient's blood pressure, pulse, and oxygen                        saturations were monitored continuously. The Olympus                        PCF-H180AL colonoscope ( S#: [id number] ) was introduced                        through the anus and advanced to the the ileocolonic                        anastomosis. The colonoscopy was performed without                        difficulty. The patient tolerated the procedure well.                        The quality of the bowel preparation was fair. Findings:      There was evidence of a prior functional end-to-end ileo-colonic       anastomosis in the proximal transverse colon. This was patent and was       characterized by healthy appearing mucosa.      A few small-mouthed diverticula were found in the sigmoid colon and       distal descending colon.      Non-bleeding internal hemorrhoids were found during retroflexion and       during anoscopy. The hemorrhoids were medium-sized and Grade II       (internal hemorrhoids that prolapse  but reduce spontaneously).      The exam was otherwise without abnormality.      The digital rectal exam was normal.  Biopsies for histology were taken with a cold forceps from the right       colon and left colon for evaluation of microscopic colitis. Impression:           - Preparation of the colon was fair.                       - Patent functional end-to-end ileo-colonic                        anastomosis, characterized by healthy appearing mucosa.                       - Diverticulosis in the sigmoid colon and in the distal                        descending colon.                       - Non-bleeding internal hemorrhoids.                       - The examination was otherwise normal. Recommendation:       - Discharge patient to home.                       - Use Analpram HC Cream 2.5%: Apply externally TID for                        10 days. Procedure Code(s):    --- Professional ---                       [phone], Colonoscopy, flexible; with biopsy, single or                        multiple Diagnosis Code(s):    --- Professional ---                       K64.1, Second degree hemorrhoids                       Z98.0, Intestinal bypass and anastomosis status                       K52.9, Noninfective gastroenteritis and colitis,                        unspecified                       Z86.010, Personal history of colonic polyps                       K57.30, Diverticulosis of large intestine without                        perforation or abscess without bleeding CPT copyright 2016 American Medical Association. All rights reserved. The codes documented in this report are preliminary and upon coder review may  be revised to meet current compliance requirements. Lollie Sails, MD 09/18/2016 2:44:53 PM This report has been signed electronically. Number of Addenda: 0 Note Initiated On: 09/18/2016 2:10 PM Scope Withdrawal Time: 0 hours 13 minutes 1  second  Total Procedure Duration: 0  hours 16 minutes 57 seconds       [facility]

## 2016-09-18 NOTE — Transfer of Care (Signed)
Immediate Anesthesia Transfer of Care Note  Patient: Thomas Sampson  Procedure(s) Performed: Procedure(s): COLONOSCOPY WITH PROPOFOL (N/A)  Patient Location: PACU  Anesthesia Type:General  Level of Consciousness: sedated  Airway & Oxygen Therapy: Patient Spontanous Breathing and Patient connected to nasal cannula oxygen  Post-op Assessment: Report given to RN and Post -op Vital signs reviewed and stable  Post vital signs: Reviewed and stable  Last Vitals:  Vitals:   09/18/16 1339 09/18/16 1443  BP: (!) 146/99 90/61  Pulse: 77 63  Resp: 18 12  Temp: 37.6 C (!) 36.2 C  SpO2: 96% 97%    Last Pain:  Vitals:   09/18/16 1443  TempSrc: Tympanic  PainSc: Asleep         Complications: No apparent anesthesia complications

## 2016-09-18 NOTE — Anesthesia Post-op Follow-up Note (Signed)
Anesthesia QCDR form completed.        

## 2016-09-19 ENCOUNTER — Encounter: Payer: Self-pay | Admitting: Gastroenterology

## 2016-09-19 NOTE — Anesthesia Postprocedure Evaluation (Signed)
Anesthesia Post Note  Patient: Thomas Sampson  Procedure(s) Performed: Procedure(s) (LRB): COLONOSCOPY WITH PROPOFOL (N/A)  Patient location during evaluation: Endoscopy Anesthesia Type: General Level of consciousness: awake and alert Pain management: pain level controlled Vital Signs Assessment: post-procedure vital signs reviewed and stable Respiratory status: spontaneous breathing, nonlabored ventilation, respiratory function stable and patient connected to nasal cannula oxygen Cardiovascular status: blood pressure returned to baseline and stable Postop Assessment: no signs of nausea or vomiting Anesthetic complications: no     Last Vitals:  Vitals:   09/18/16 1503 09/18/16 1513  BP: 125/80 131/74  Pulse: 65 (!) 59  Resp: 16 (!) 23  Temp:    SpO2: 97% 96%    Last Pain:  Vitals:   09/19/16 0739  TempSrc:   PainSc: 0-No pain                 Martha Clan

## 2016-09-21 LAB — SURGICAL PATHOLOGY

## 2016-09-26 DIAGNOSIS — N183 Chronic kidney disease, stage 3 (moderate): Secondary | ICD-10-CM | POA: Diagnosis not present

## 2016-09-26 DIAGNOSIS — Z794 Long term (current) use of insulin: Secondary | ICD-10-CM | POA: Diagnosis not present

## 2016-09-26 DIAGNOSIS — E1122 Type 2 diabetes mellitus with diabetic chronic kidney disease: Secondary | ICD-10-CM | POA: Diagnosis not present

## 2016-10-03 DIAGNOSIS — E1142 Type 2 diabetes mellitus with diabetic polyneuropathy: Secondary | ICD-10-CM | POA: Diagnosis not present

## 2016-10-03 DIAGNOSIS — Z794 Long term (current) use of insulin: Secondary | ICD-10-CM | POA: Diagnosis not present

## 2016-10-03 DIAGNOSIS — N183 Chronic kidney disease, stage 3 (moderate): Secondary | ICD-10-CM | POA: Diagnosis not present

## 2016-10-03 DIAGNOSIS — E1122 Type 2 diabetes mellitus with diabetic chronic kidney disease: Secondary | ICD-10-CM | POA: Diagnosis not present

## 2016-10-03 DIAGNOSIS — E119 Type 2 diabetes mellitus without complications: Secondary | ICD-10-CM | POA: Diagnosis not present

## 2016-10-19 DIAGNOSIS — N183 Chronic kidney disease, stage 3 (moderate): Secondary | ICD-10-CM | POA: Diagnosis not present

## 2016-10-19 DIAGNOSIS — I10 Essential (primary) hypertension: Secondary | ICD-10-CM | POA: Diagnosis not present

## 2016-10-19 DIAGNOSIS — E1122 Type 2 diabetes mellitus with diabetic chronic kidney disease: Secondary | ICD-10-CM | POA: Diagnosis not present

## 2016-10-19 DIAGNOSIS — E559 Vitamin D deficiency, unspecified: Secondary | ICD-10-CM | POA: Diagnosis not present

## 2016-10-23 DIAGNOSIS — M47816 Spondylosis without myelopathy or radiculopathy, lumbar region: Secondary | ICD-10-CM | POA: Diagnosis not present

## 2016-10-23 DIAGNOSIS — M545 Low back pain: Secondary | ICD-10-CM | POA: Diagnosis not present

## 2016-10-30 DIAGNOSIS — H26492 Other secondary cataract, left eye: Secondary | ICD-10-CM | POA: Diagnosis not present

## 2016-11-06 DIAGNOSIS — R109 Unspecified abdominal pain: Secondary | ICD-10-CM | POA: Diagnosis not present

## 2016-11-08 DIAGNOSIS — R109 Unspecified abdominal pain: Secondary | ICD-10-CM | POA: Diagnosis not present

## 2016-11-08 DIAGNOSIS — Z23 Encounter for immunization: Secondary | ICD-10-CM | POA: Diagnosis not present

## 2017-01-10 DIAGNOSIS — L723 Sebaceous cyst: Secondary | ICD-10-CM | POA: Diagnosis not present

## 2017-01-10 DIAGNOSIS — D1801 Hemangioma of skin and subcutaneous tissue: Secondary | ICD-10-CM | POA: Diagnosis not present

## 2017-01-10 DIAGNOSIS — Z85828 Personal history of other malignant neoplasm of skin: Secondary | ICD-10-CM | POA: Diagnosis not present

## 2017-01-10 DIAGNOSIS — L821 Other seborrheic keratosis: Secondary | ICD-10-CM | POA: Diagnosis not present

## 2017-01-10 DIAGNOSIS — L565 Disseminated superficial actinic porokeratosis (DSAP): Secondary | ICD-10-CM | POA: Diagnosis not present

## 2017-01-26 DIAGNOSIS — N183 Chronic kidney disease, stage 3 (moderate): Secondary | ICD-10-CM | POA: Diagnosis not present

## 2017-01-26 DIAGNOSIS — E7800 Pure hypercholesterolemia, unspecified: Secondary | ICD-10-CM | POA: Diagnosis not present

## 2017-02-02 DIAGNOSIS — E7800 Pure hypercholesterolemia, unspecified: Secondary | ICD-10-CM | POA: Diagnosis not present

## 2017-02-02 DIAGNOSIS — I10 Essential (primary) hypertension: Secondary | ICD-10-CM | POA: Diagnosis not present

## 2017-02-02 DIAGNOSIS — E1122 Type 2 diabetes mellitus with diabetic chronic kidney disease: Secondary | ICD-10-CM | POA: Diagnosis not present

## 2017-02-02 DIAGNOSIS — N183 Chronic kidney disease, stage 3 (moderate): Secondary | ICD-10-CM | POA: Diagnosis not present

## 2017-02-02 DIAGNOSIS — Z794 Long term (current) use of insulin: Secondary | ICD-10-CM | POA: Diagnosis not present

## 2017-03-01 DIAGNOSIS — N183 Chronic kidney disease, stage 3 (moderate): Secondary | ICD-10-CM | POA: Diagnosis not present

## 2017-03-01 DIAGNOSIS — E1122 Type 2 diabetes mellitus with diabetic chronic kidney disease: Secondary | ICD-10-CM | POA: Diagnosis not present

## 2017-03-01 DIAGNOSIS — I10 Essential (primary) hypertension: Secondary | ICD-10-CM | POA: Diagnosis not present

## 2017-03-01 DIAGNOSIS — E559 Vitamin D deficiency, unspecified: Secondary | ICD-10-CM | POA: Diagnosis not present

## 2017-03-15 DIAGNOSIS — J069 Acute upper respiratory infection, unspecified: Secondary | ICD-10-CM | POA: Diagnosis not present

## 2017-03-15 DIAGNOSIS — Z794 Long term (current) use of insulin: Secondary | ICD-10-CM | POA: Diagnosis not present

## 2017-03-15 DIAGNOSIS — N183 Chronic kidney disease, stage 3 (moderate): Secondary | ICD-10-CM | POA: Diagnosis not present

## 2017-03-15 DIAGNOSIS — E1122 Type 2 diabetes mellitus with diabetic chronic kidney disease: Secondary | ICD-10-CM | POA: Diagnosis not present

## 2017-04-10 DIAGNOSIS — E1122 Type 2 diabetes mellitus with diabetic chronic kidney disease: Secondary | ICD-10-CM | POA: Diagnosis not present

## 2017-04-10 DIAGNOSIS — N183 Chronic kidney disease, stage 3 (moderate): Secondary | ICD-10-CM | POA: Diagnosis not present

## 2017-04-10 DIAGNOSIS — Z794 Long term (current) use of insulin: Secondary | ICD-10-CM | POA: Diagnosis not present

## 2017-04-17 DIAGNOSIS — Z794 Long term (current) use of insulin: Secondary | ICD-10-CM | POA: Diagnosis not present

## 2017-04-17 DIAGNOSIS — E1122 Type 2 diabetes mellitus with diabetic chronic kidney disease: Secondary | ICD-10-CM | POA: Diagnosis not present

## 2017-04-17 DIAGNOSIS — E1142 Type 2 diabetes mellitus with diabetic polyneuropathy: Secondary | ICD-10-CM | POA: Diagnosis not present

## 2017-04-17 DIAGNOSIS — N183 Chronic kidney disease, stage 3 (moderate): Secondary | ICD-10-CM | POA: Diagnosis not present

## 2017-04-17 DIAGNOSIS — I10 Essential (primary) hypertension: Secondary | ICD-10-CM | POA: Diagnosis not present

## 2017-05-14 DIAGNOSIS — E119 Type 2 diabetes mellitus without complications: Secondary | ICD-10-CM | POA: Diagnosis not present

## 2017-05-21 DIAGNOSIS — M25511 Pain in right shoulder: Secondary | ICD-10-CM | POA: Diagnosis not present

## 2017-05-21 DIAGNOSIS — M7061 Trochanteric bursitis, right hip: Secondary | ICD-10-CM | POA: Diagnosis not present

## 2017-07-06 DIAGNOSIS — I10 Essential (primary) hypertension: Secondary | ICD-10-CM | POA: Diagnosis not present

## 2017-07-06 DIAGNOSIS — E559 Vitamin D deficiency, unspecified: Secondary | ICD-10-CM | POA: Diagnosis not present

## 2017-07-06 DIAGNOSIS — E1122 Type 2 diabetes mellitus with diabetic chronic kidney disease: Secondary | ICD-10-CM | POA: Diagnosis not present

## 2017-07-06 DIAGNOSIS — N183 Chronic kidney disease, stage 3 (moderate): Secondary | ICD-10-CM | POA: Diagnosis not present

## 2017-07-13 DIAGNOSIS — N183 Chronic kidney disease, stage 3 (moderate): Secondary | ICD-10-CM | POA: Diagnosis not present

## 2017-07-13 DIAGNOSIS — Z794 Long term (current) use of insulin: Secondary | ICD-10-CM | POA: Diagnosis not present

## 2017-07-13 DIAGNOSIS — E1122 Type 2 diabetes mellitus with diabetic chronic kidney disease: Secondary | ICD-10-CM | POA: Diagnosis not present

## 2017-07-20 DIAGNOSIS — I10 Essential (primary) hypertension: Secondary | ICD-10-CM | POA: Diagnosis not present

## 2017-07-20 DIAGNOSIS — N183 Chronic kidney disease, stage 3 (moderate): Secondary | ICD-10-CM | POA: Diagnosis not present

## 2017-07-20 DIAGNOSIS — Z794 Long term (current) use of insulin: Secondary | ICD-10-CM | POA: Diagnosis not present

## 2017-07-20 DIAGNOSIS — E1142 Type 2 diabetes mellitus with diabetic polyneuropathy: Secondary | ICD-10-CM | POA: Diagnosis not present

## 2017-07-20 DIAGNOSIS — E1122 Type 2 diabetes mellitus with diabetic chronic kidney disease: Secondary | ICD-10-CM | POA: Diagnosis not present

## 2017-08-03 DIAGNOSIS — G629 Polyneuropathy, unspecified: Secondary | ICD-10-CM | POA: Diagnosis not present

## 2017-08-03 DIAGNOSIS — M25511 Pain in right shoulder: Secondary | ICD-10-CM | POA: Diagnosis not present

## 2017-08-03 DIAGNOSIS — N183 Chronic kidney disease, stage 3 (moderate): Secondary | ICD-10-CM | POA: Diagnosis not present

## 2017-08-03 DIAGNOSIS — Z794 Long term (current) use of insulin: Secondary | ICD-10-CM | POA: Diagnosis not present

## 2017-08-03 DIAGNOSIS — E7800 Pure hypercholesterolemia, unspecified: Secondary | ICD-10-CM | POA: Diagnosis not present

## 2017-08-03 DIAGNOSIS — I10 Essential (primary) hypertension: Secondary | ICD-10-CM | POA: Diagnosis not present

## 2017-08-03 DIAGNOSIS — Z0000 Encounter for general adult medical examination without abnormal findings: Secondary | ICD-10-CM | POA: Diagnosis not present

## 2017-08-03 DIAGNOSIS — E1122 Type 2 diabetes mellitus with diabetic chronic kidney disease: Secondary | ICD-10-CM | POA: Diagnosis not present

## 2017-08-13 DIAGNOSIS — D1801 Hemangioma of skin and subcutaneous tissue: Secondary | ICD-10-CM | POA: Diagnosis not present

## 2017-08-13 DIAGNOSIS — L565 Disseminated superficial actinic porokeratosis (DSAP): Secondary | ICD-10-CM | POA: Diagnosis not present

## 2017-08-13 DIAGNOSIS — L723 Sebaceous cyst: Secondary | ICD-10-CM | POA: Diagnosis not present

## 2017-08-13 DIAGNOSIS — B351 Tinea unguium: Secondary | ICD-10-CM | POA: Diagnosis not present

## 2017-08-13 DIAGNOSIS — L814 Other melanin hyperpigmentation: Secondary | ICD-10-CM | POA: Diagnosis not present

## 2017-08-13 DIAGNOSIS — I788 Other diseases of capillaries: Secondary | ICD-10-CM | POA: Diagnosis not present

## 2017-08-13 DIAGNOSIS — B353 Tinea pedis: Secondary | ICD-10-CM | POA: Diagnosis not present

## 2017-08-29 DIAGNOSIS — I739 Peripheral vascular disease, unspecified: Secondary | ICD-10-CM | POA: Diagnosis not present

## 2017-09-21 ENCOUNTER — Encounter

## 2017-09-21 ENCOUNTER — Encounter (INDEPENDENT_AMBULATORY_CARE_PROVIDER_SITE_OTHER): Payer: Self-pay | Admitting: Vascular Surgery

## 2017-09-21 ENCOUNTER — Ambulatory Visit (INDEPENDENT_AMBULATORY_CARE_PROVIDER_SITE_OTHER): Payer: Medicare Other | Admitting: Vascular Surgery

## 2017-09-21 VITALS — BP 135/61 | HR 65 | Resp 16 | Ht 69.0 in | Wt 212.0 lb

## 2017-09-21 DIAGNOSIS — I739 Peripheral vascular disease, unspecified: Secondary | ICD-10-CM | POA: Diagnosis not present

## 2017-09-21 DIAGNOSIS — N183 Chronic kidney disease, stage 3 unspecified: Secondary | ICD-10-CM | POA: Insufficient documentation

## 2017-09-21 DIAGNOSIS — E119 Type 2 diabetes mellitus without complications: Secondary | ICD-10-CM | POA: Diagnosis not present

## 2017-09-21 DIAGNOSIS — I10 Essential (primary) hypertension: Secondary | ICD-10-CM | POA: Insufficient documentation

## 2017-09-21 DIAGNOSIS — E1129 Type 2 diabetes mellitus with other diabetic kidney complication: Secondary | ICD-10-CM | POA: Insufficient documentation

## 2017-09-21 NOTE — Patient Instructions (Signed)

## 2017-09-21 NOTE — Assessment & Plan Note (Signed)
Patient describes claudication symptoms of the right leg.  The fact that it has gotten better with rest and is not pain every time with walking would lean against arterial claudication as would his hip bursitis being a causative factor for neurogenic claudication, but he certainly has multiple atherosclerotic risk factors.  I would recommend noninvasive studies including arterial duplex and ABIs for full evaluation of his arterial circulation.  These will be done at his convenience in the near future.  I will see him back following the studies.

## 2017-09-21 NOTE — Progress Notes (Signed)
Patient ID: Thomas Sampson, male   DOB: 03-10-1945, 72 y.o.   MRN: [id number]  Chief Complaint  Patient presents with  . New Patient (Initial Visit)    for claudication    HPI Thomas Sampson is a 72 y.o. male.  I am asked to see the patient by DR. Johnston for evaluation of right leg claudication.  The patient reports this has become very bothersome to him over several weeks time the summer.  He does report since he retired from work about 3 weeks ago the pain is gotten significantly better.  He has had trouble with hip bursitis on that right side.  No left leg symptoms.  He says he can now walk unencumbered for much better distances than he could a few weeks ago.  No ulceration or infection.  No ischemic rest pain.  He was also getting calf cramps at rest but have gotten better with Gatorade drinking.   Past Medical History:  Diagnosis Date  . Chronic kidney disease    kidney stones  . Diabetes mellitus without complication ([place])   . Hyperlipidemia   . Hypertension   . Tinea pedis     Past Surgical History:  Procedure Laterality Date  . COLON SURGERY    . COLONOSCOPY WITH PROPOFOL N/A 09/18/2016   Procedure: COLONOSCOPY WITH PROPOFOL;  Surgeon: Lollie Sails, MD;  Location[facility] ENDOSCOPY;  Service: Endoscopy;  Laterality: N/A;  . EYE SURGERY     cataract extraction    Family History  Problem Relation Age of Onset  . Cancer Mother   . Heart attack Father   . Heart attack Sister   . Heart disease Sister   . Diabetes Brother      Social History Social History   Tobacco Use  . Smoking status: Former Research scientist (life sciences)  . Smokeless tobacco: Never Used  Substance Use Topics  . Alcohol use: No  . Drug use: No     Allergies  Allergen Reactions  . Demerol [Meperidine]     Current Outpatient Medications  Medication Sig Dispense Refill  . amLODipine (NORVASC) 2.5 MG tablet Take 2.5 mg by mouth daily.    Marland Kitchen aspirin EC 81 MG tablet Take by mouth.    . Cholecalciferol  2000 units CAPS Take 2,000 Units by mouth daily.    . cholestyramine (QUESTRAN) 4 GM/DOSE powder Take by mouth 2 (two) times daily with a meal.    . gabapentin (NEURONTIN) 100 MG capsule Take by mouth.    Marland Kitchen glipiZIDE (GLUCOTROL XL) 10 MG 24 hr tablet Take 10 mg by mouth at bedtime.    . hydrochlorothiazide (HYDRODIURIL) 12.5 MG tablet Take 12.5 mg by mouth daily.    . insulin aspart protamine - aspart (NOVOLOG 70/30 MIX) (70-30) 100 UNIT/ML FlexPen Inject 24 units subcutaneously before breakfast and 28 units before supper    . lisinopril (PRINIVIL,ZESTRIL) 40 MG tablet Take 40 mg by mouth daily. Take 20 mg twice a day    . metFORMIN (GLUCOPHAGE-XR) 500 MG 24 hr tablet Take 1,000 mg by mouth 2 (two) times daily.    . metoprolol succinate (TOPROL-XL) 100 MG 24 hr tablet Take 100 mg by mouth daily. Take with or immediately following a meal.    . pantoprazole (PROTONIX) 40 MG tablet Take 40 mg by mouth daily.    . insulin aspart (NOVOLOG) 100 UNIT/ML injection Inject 26 Units into the skin AC breakfast. take 26 units before breakfast and 22 units before supper  No current facility-administered medications for this visit.       REVIEW OF SYSTEMS (Negative unless checked)  Constitutional: [] Weight loss  [] Fever  [] Chills Cardiac: [] Chest pain   [] Chest pressure   [] Palpitations   [] Shortness of breath when laying flat   [] Shortness of breath at rest   [] Shortness of breath with exertion. Vascular:  [x] Pain in legs with walking   [] Pain in legs at rest   [] Pain in legs when laying flat   [x] Claudication   [] Pain in feet when walking  [] Pain in feet at rest  [] Pain in feet when laying flat   [] History of DVT   [] Phlebitis   [] Swelling in legs   [] Varicose veins   [] Non-healing ulcers Pulmonary:   [] Uses home oxygen   [] Productive cough   [] Hemoptysis   [] Wheeze  [] COPD   [] Asthma Neurologic:  [] Dizziness  [] Blackouts   [] Seizures   [] History of stroke   [] History of TIA  [] Aphasia   [] Temporary  blindness   [] Dysphagia   [] Weakness or numbness in arms   [] Weakness or numbness in legs Musculoskeletal:  [x] Arthritis   [] Joint swelling   [] Joint pain   [] Low back pain Hematologic:  [] Easy bruising  [] Easy bleeding   [] Hypercoagulable state   [] Anemic  [] Hepatitis Gastrointestinal:  [] Blood in stool   [] Vomiting blood  [] Gastroesophageal reflux/heartburn   [] Abdominal pain Genitourinary:  [x] Chronic kidney disease   [] Difficult urination  [] Frequent urination  [] Burning with urination   [] Hematuria Skin:  [] Rashes   [] Ulcers   [] Wounds Psychological:  [] History of anxiety   []  History of major depression.    Physical Exam BP 135/61 (BP Location: Right Arm)   Pulse 65   Resp 16   Ht 5\' 9"  (1.753 m)   Wt 212 lb (96.2 kg)   BMI 31.31 kg/m  Gen:  WD/WN, NAD Head: [place]/AT, No temporalis wasting. Ear/Nose/Throat: Hearing grossly intact, nares w/o erythema or drainage, oropharynx w/o Erythema/Exudate Eyes: Conjunctiva clear, sclera non-icteric  Neck: trachea midline.  Pulmonary:  Good air movement, respirations not labored, no use of accessory muscles Cardiac: RRR, no JVD Vascular:  Vessel Right Left  Radial Palpable Palpable                          PT  1+ palpable  1+ palpable  DP Palpable Palpable   Gastrointestinal: soft, non-tender/non-distended. Musculoskeletal: M/S 5/5 throughout.  Extremities without ischemic changes.  No deformity or atrophy.  No edema. Neurologic: Sensation grossly intact in extremities.  Symmetrical.  Speech is fluent. Motor exam as listed above. Psychiatric: Judgment intact, Mood & affect appropriate for pt's clinical situation. Dermatologic: No rashes or ulcers noted.  No cellulitis or open wounds.    Radiology No results found.  Labs No results found for this or any previous visit (from the past 2160 hour(s)).  Assessment/Plan:  Diabetes mellitus without complication (HCC) blood glucose control important in reducing the progression of  atherosclerotic disease. Also, involved in wound healing. On appropriate medications.   Hypertension blood pressure control important in reducing the progression of atherosclerotic disease. On appropriate oral medications.   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  The fact that it has gotten better with rest and is not pain every time with walking would lean against arterial  claudication as would his hip bursitis being a causative factor for neurogenic claudication, but he certainly has multiple atherosclerotic risk factors.  I would recommend noninvasive studies including arterial duplex and ABIs for full evaluation of his arterial circulation.  These will be done at his convenience in the near future.  I will see him back following the studies.      Leotis Pain 09/21/2017, 10:51 AM   This note was created with Dragon medical transcription system.  Any errors from dictation are unintentional.

## 2017-09-21 NOTE — Assessment & Plan Note (Signed)
blood pressure control important in reducing the progression of atherosclerotic disease. On appropriate oral medications.  

## 2017-09-21 NOTE — Assessment & Plan Note (Signed)
We will try to avoid any contrast and perform noninvasive studies unless he develops debilitating symptoms

## 2017-09-21 NOTE — Assessment & Plan Note (Signed)
blood glucose control important in reducing the progression of atherosclerotic disease. Also, involved in wound healing. On appropriate medications.  

## 2017-10-17 ENCOUNTER — Encounter (INDEPENDENT_AMBULATORY_CARE_PROVIDER_SITE_OTHER): Payer: Self-pay | Admitting: Vascular Surgery

## 2017-10-17 ENCOUNTER — Ambulatory Visit (INDEPENDENT_AMBULATORY_CARE_PROVIDER_SITE_OTHER): Payer: Medicare Other

## 2017-10-17 ENCOUNTER — Ambulatory Visit (INDEPENDENT_AMBULATORY_CARE_PROVIDER_SITE_OTHER): Payer: Medicare Other | Admitting: Vascular Surgery

## 2017-10-17 VITALS — BP 136/83 | HR 67 | Resp 16 | Ht 69.0 in | Wt 210.0 lb

## 2017-10-17 DIAGNOSIS — I739 Peripheral vascular disease, unspecified: Secondary | ICD-10-CM

## 2017-10-17 DIAGNOSIS — I10 Essential (primary) hypertension: Secondary | ICD-10-CM

## 2017-10-17 DIAGNOSIS — E119 Type 2 diabetes mellitus without complications: Secondary | ICD-10-CM | POA: Diagnosis not present

## 2017-10-17 NOTE — Progress Notes (Signed)
Subjective:    Patient ID: Thomas Sampson, male    DOB: 1945-03-21, 72 y.o.   MRN: [id number] Chief Complaint  Patient presents with  . Follow-up    pt conv abi,right le arterial ultrasound    Patient was last seen on September 21, 2017 for evaluation of bilateral lower extremity claudication especially to the right leg.  The patient presents today to review the vascular studies.  The patient's symptoms remain stable and occur on a intermittent basis.  No rest pain or ulcer formation to the bilateral legs.  The patient underwent a bilateral ABI which was notable for right: 1.23, triphasic tibial waveforms. Left: 1.21, triphasic tibial waveforms.  No evidence of significant bilateral lower extremity arterial disease.  The bilateral toe brachial indices were normal.  Right lower extremity arterial duplex was notable for triphasic waveforms distally. Patient denies any fever, nausea or vomiting.  Review of Systems  Constitutional: Negative.   HENT: Negative.   Eyes: Negative.   Respiratory: Negative.   Cardiovascular:       Claudication  Gastrointestinal: Negative.   Endocrine: Negative.   Genitourinary: Negative.   Musculoskeletal: Negative.   Skin: Negative.   Allergic/Immunologic: Negative.   Neurological: Negative.   Hematological: Negative.   Psychiatric/Behavioral: Negative.       Objective:   Physical Exam  Constitutional: He is oriented to person, place, and time. He appears well-developed and well-nourished. No distress.  HENT:  Head: Normocephalic and atraumatic.  Right Ear: External ear normal.  Left Ear: External ear normal.  Eyes: Pupils are equal, round, and reactive to light. Conjunctivae and EOM are normal.  Neck: Normal range of motion.  Cardiovascular: Normal rate, regular rhythm, normal heart sounds and intact distal pulses.  Pulses:      Radial pulses are 2+ on the right side, and 2+ on the left side.       Dorsalis pedis pulses are 1+ on the right side, and  1+ on the left side.       Posterior tibial pulses are 1+ on the right side, and 1+ on the left side.  Pulmonary/Chest: Effort normal and breath sounds normal.  Musculoskeletal: Normal range of motion. He exhibits no edema.  Neurological: He is alert and oriented to person, place, and time.  Skin: Skin is warm and dry. He is not diaphoretic.  Psychiatric: He has a normal mood and affect. His behavior is normal. Judgment and thought content normal.  Vitals reviewed.  BP 136/83 (BP Location: Left Arm)   Pulse 67   Resp 16   Ht 5\' 9"  (1.753 m)   Wt 210 lb (95.3 kg)   BMI 31.01 kg/m   Past Medical History:  Diagnosis Date  . Chronic kidney disease    kidney stones  . Diabetes mellitus without complication ([place])   . Hyperlipidemia   . Hypertension   . Tinea pedis    Social History   Socioeconomic History  . Marital status: Married    Spouse name: Not on file  . Number of children: Not on file  . Years of education: Not on file  . Highest education level: Not on file  Occupational History  . Not on file  Social Needs  . Financial resource strain: Not on file  . Food insecurity:    Worry: Not on file    Inability: Not on file  . Transportation needs:    Medical: Not on file    Non-medical: Not on file  Tobacco  Use  . Smoking status: Former Smoker  . Smokeless tobacco: Never Used  Substance and Sexual Activity  . Alcohol use: No  . Drug use: No  . Sexual activity: Not on file  Lifestyle  . Physical activity:    Days per week: Not on file    Minutes per session: Not on file  . Stress: Not on file  Relationships  . Social connections:    Talks on phone: Not on file    Gets together: Not on file    Attends religious service: Not on file    Active member of club or organization: Not on file    Attends meetings of clubs or organizations: Not on file    Relationship status: Not on file  . Intimate partner violence:    Fear of current or ex partner: Not on file     Emotionally abused: Not on file    Physically abused: Not on file    Forced sexual activity: Not on file  Other Topics Concern  . Not on file  Social History Narrative  . Not on file   Past Surgical History:  Procedure Laterality Date  . COLON SURGERY    . COLONOSCOPY WITH PROPOFOL N/A 09/18/2016   Procedure: COLONOSCOPY WITH PROPOFOL;  Surgeon: Lollie Sails, MD;  Location: [facility] ENDOSCOPY;  Service: Endoscopy;  Laterality: N/A;  . EYE SURGERY     cataract extraction   Family History  Problem Relation Age of Onset  . Cancer Mother   . Heart attack Father   . Heart attack Sister   . Heart disease Sister   . Diabetes Brother    Allergies  Allergen Reactions  . Demerol [Meperidine]       Assessment & Plan:  Patient was last seen on September 21, 2017 for evaluation of bilateral lower extremity claudication especially to the right leg.  The patient presents today to review the vascular studies.  The patient's symptoms remain stable and occur on a intermittent basis.  No rest pain or ulcer formation to the bilateral legs.  The patient underwent a bilateral ABI which was notable for right: 1.23, triphasic tibial waveforms. Left: 1.21, triphasic tibial waveforms.  No evidence of significant bilateral lower extremity arterial disease.  The bilateral toe brachial indices were normal.  Right lower extremity arterial duplex was notable for triphasic waveforms distally. Patient denies any fever, nausea or vomiting.  1. Claudication ([place]) - Stable I do not feel the patient's claudication-like symptoms to his lower extremity are arterial insufficiency. The patient's ABI and right lower extremity arterial duplex were normal and had triphasic waveforms. Encourage the patient to follow-up with his primary care physician to possibly rule out osteoarthritis of the native joint disease. Patient seen with his wife. Patient and wife expressed their understanding I have discussed with the patient at  length the risk factors for and pathogenesis of atherosclerotic disease and encouraged a healthy diet, regular exercise regimen and blood pressure / glucose control.  The patient was encouraged to call the office in the interim if he experiences any claudication like symptoms, rest pain or ulcers to his feet / toes.  2. Diabetes mellitus without complication ([place]) - Stable Encouraged good control as its slows the progression of atherosclerotic disease  3. Essential hypertension - Stable Encouraged good control as its slows the progression of atherosclerotic disease  Current Outpatient Medications on File Prior to Visit  Medication Sig Dispense Refill  . amLODipine (NORVASC) 2.5 MG tablet Take 2.5  mg by mouth daily.    Marland Kitchen aspirin EC 81 MG tablet Take by mouth.    . Cholecalciferol 2000 units CAPS Take 2,000 Units by mouth daily.    . cholestyramine (QUESTRAN) 4 GM/DOSE powder Take by mouth 2 (two) times daily with a meal.    . gabapentin (NEURONTIN) 100 MG capsule Take by mouth.    Marland Kitchen glipiZIDE (GLUCOTROL XL) 10 MG 24 hr tablet Take 10 mg by mouth at bedtime.    . hydrochlorothiazide (HYDRODIURIL) 12.5 MG tablet Take 12.5 mg by mouth daily.    . insulin aspart (NOVOLOG) 100 UNIT/ML injection Inject 26 Units into the skin AC breakfast. take 26 units before breakfast and 22 units before supper    . insulin aspart protamine - aspart (NOVOLOG 70/30 MIX) (70-30) 100 UNIT/ML FlexPen Inject 24 units subcutaneously before breakfast and 28 units before supper    . lisinopril (PRINIVIL,ZESTRIL) 40 MG tablet Take 40 mg by mouth daily. Take 20 mg twice a day    . metFORMIN (GLUCOPHAGE-XR) 500 MG 24 hr tablet Take 1,000 mg by mouth 2 (two) times daily.    . metoprolol succinate (TOPROL-XL) 100 MG 24 hr tablet Take 100 mg by mouth daily. Take with or immediately following a meal.    . pantoprazole (PROTONIX) 40 MG tablet Take 40 mg by mouth daily.     No current facility-administered medications on file  prior to visit.    There are no Patient Instructions on file for this visit. No follow-ups on file.  Delesha Pohlman A Mallory Schaad, PA-C

## 2017-10-26 DIAGNOSIS — N183 Chronic kidney disease, stage 3 (moderate): Secondary | ICD-10-CM | POA: Diagnosis not present

## 2017-10-26 DIAGNOSIS — E1122 Type 2 diabetes mellitus with diabetic chronic kidney disease: Secondary | ICD-10-CM | POA: Diagnosis not present

## 2017-10-26 DIAGNOSIS — I10 Essential (primary) hypertension: Secondary | ICD-10-CM | POA: Diagnosis not present

## 2017-10-26 DIAGNOSIS — E559 Vitamin D deficiency, unspecified: Secondary | ICD-10-CM | POA: Diagnosis not present

## 2017-10-31 DIAGNOSIS — M545 Low back pain: Secondary | ICD-10-CM | POA: Diagnosis not present

## 2017-10-31 DIAGNOSIS — M47817 Spondylosis without myelopathy or radiculopathy, lumbosacral region: Secondary | ICD-10-CM | POA: Diagnosis not present

## 2017-11-09 DIAGNOSIS — Z1211 Encounter for screening for malignant neoplasm of colon: Secondary | ICD-10-CM | POA: Diagnosis not present

## 2017-11-09 DIAGNOSIS — K921 Melena: Secondary | ICD-10-CM | POA: Diagnosis not present

## 2017-11-14 ENCOUNTER — Ambulatory Visit (INDEPENDENT_AMBULATORY_CARE_PROVIDER_SITE_OTHER): Payer: Medicare Other | Admitting: Vascular Surgery

## 2017-11-14 ENCOUNTER — Encounter (INDEPENDENT_AMBULATORY_CARE_PROVIDER_SITE_OTHER): Payer: Medicare Other

## 2018-01-14 DIAGNOSIS — Z794 Long term (current) use of insulin: Secondary | ICD-10-CM | POA: Diagnosis not present

## 2018-01-14 DIAGNOSIS — E1122 Type 2 diabetes mellitus with diabetic chronic kidney disease: Secondary | ICD-10-CM | POA: Diagnosis not present

## 2018-01-14 DIAGNOSIS — N183 Chronic kidney disease, stage 3 (moderate): Secondary | ICD-10-CM | POA: Diagnosis not present

## 2018-01-16 DIAGNOSIS — L565 Disseminated superficial actinic porokeratosis (DSAP): Secondary | ICD-10-CM | POA: Diagnosis not present

## 2018-01-21 DIAGNOSIS — Z794 Long term (current) use of insulin: Secondary | ICD-10-CM | POA: Diagnosis not present

## 2018-01-21 DIAGNOSIS — N183 Chronic kidney disease, stage 3 (moderate): Secondary | ICD-10-CM | POA: Diagnosis not present

## 2018-01-21 DIAGNOSIS — I10 Essential (primary) hypertension: Secondary | ICD-10-CM | POA: Diagnosis not present

## 2018-01-21 DIAGNOSIS — E1142 Type 2 diabetes mellitus with diabetic polyneuropathy: Secondary | ICD-10-CM | POA: Diagnosis not present

## 2018-01-21 DIAGNOSIS — E1122 Type 2 diabetes mellitus with diabetic chronic kidney disease: Secondary | ICD-10-CM | POA: Diagnosis not present

## 2018-10-15 ENCOUNTER — Other Ambulatory Visit: Payer: Self-pay

## 2018-10-15 ENCOUNTER — Encounter (INDEPENDENT_AMBULATORY_CARE_PROVIDER_SITE_OTHER): Payer: Self-pay

## 2018-10-15 ENCOUNTER — Encounter (INDEPENDENT_AMBULATORY_CARE_PROVIDER_SITE_OTHER): Payer: Self-pay | Admitting: Vascular Surgery

## 2018-10-15 ENCOUNTER — Ambulatory Visit (INDEPENDENT_AMBULATORY_CARE_PROVIDER_SITE_OTHER): Payer: Medicare Other | Admitting: Vascular Surgery

## 2018-10-15 VITALS — BP 126/80 | HR 62 | Resp 16 | Ht 69.0 in | Wt 215.0 lb

## 2018-10-15 DIAGNOSIS — I739 Peripheral vascular disease, unspecified: Secondary | ICD-10-CM

## 2018-10-15 DIAGNOSIS — E119 Type 2 diabetes mellitus without complications: Secondary | ICD-10-CM | POA: Diagnosis not present

## 2018-10-15 DIAGNOSIS — N183 Chronic kidney disease, stage 3 unspecified: Secondary | ICD-10-CM

## 2018-10-15 DIAGNOSIS — E785 Hyperlipidemia, unspecified: Secondary | ICD-10-CM | POA: Insufficient documentation

## 2018-10-15 DIAGNOSIS — I10 Essential (primary) hypertension: Secondary | ICD-10-CM

## 2018-10-15 NOTE — Assessment & Plan Note (Signed)
It is encouraging that his symptoms are gotten better with some medication changes, but I think reassessment with ABIs is certainly a reasonable option to ensure that significant peripheral arterial disease has not developed in the interim.  We will see him back with noninvasive studies in the near future.

## 2018-10-15 NOTE — Progress Notes (Signed)
MRN : [id number]  Thomas Sampson is a 73 y.o. (01-04-1946) male who presents with chief complaint of  Chief Complaint  Patient presents with  . Follow-up    est patient for PAD  .  History of Present Illness: Patient returns today in follow up of his leg pain to reassess for PAD.  He was checked last year and at that time his arterial perfusion was intact.  Over the past several months, he has been having debilitating cramping in his calves with activity.  The right leg is the more severely affected of the 2 lobes.  He has no pain at rest.  He denies any ulceration or infection.  He was started on alpha lipoic acid by his primary care physician and that has markedly improved his leg pain symptoms.  Current Outpatient Medications  Medication Sig Dispense Refill  . Alpha-Lipoic Acid 600 MG TABS Take by mouth.    Marland Kitchen amLODipine (NORVASC) 2.5 MG tablet Take 2.5 mg by mouth daily.    Marland Kitchen aspirin EC 81 MG tablet Take by mouth.    . Cholecalciferol 2000 units CAPS Take 2,000 Units by mouth daily.    . cholestyramine (QUESTRAN) 4 GM/DOSE powder Take by mouth 2 (two) times daily with a meal.    . cyanocobalamin (,VITAMIN B-12,) 1000 MCG/ML injection Inject 15ml IM once very 4 weeks then once a month for 4 months.    . cyanocobalamin 1000 MCG tablet Take 1,000 mcg by mouth daily.    Marland Kitchen gabapentin (NEURONTIN) 300 MG capsule Take by mouth 3 (three) times daily.     Marland Kitchen glipiZIDE (GLUCOTROL XL) 10 MG 24 hr tablet Take 10 mg by mouth at bedtime.    Marland Kitchen glucose blood (ONETOUCH ULTRA) test strip Use 2 (two) times daily Dx E11.9    . hydrochlorothiazide (HYDRODIURIL) 12.5 MG tablet Take 12.5 mg by mouth daily.    . insulin aspart protamine - aspart (NOVOLOG 70/30 MIX) (70-30) 100 UNIT/ML FlexPen Inject 24 units subcutaneously before breakfast and 28 units before supper    . Insulin Pen Needle (BD PEN NEEDLE NANO U/F) 32G X 4 MM MISC USE WITH INJECTIONS 2 TIMES DAILY    . lisinopril (PRINIVIL,ZESTRIL) 40 MG  tablet Take 40 mg by mouth daily. Take 20 mg twice a day    . metFORMIN (GLUCOPHAGE-XR) 500 MG 24 hr tablet Take 1,000 mg by mouth 2 (two) times daily.    . metoprolol succinate (TOPROL-XL) 100 MG 24 hr tablet Take 100 mg by mouth daily. Take with or immediately following a meal.    . pantoprazole (PROTONIX) 40 MG tablet Take 40 mg by mouth daily.    . insulin aspart (NOVOLOG) 100 UNIT/ML injection Inject 26 Units into the skin AC breakfast. take 26 units before breakfast and 22 units before supper     No current facility-administered medications for this visit.     Past Medical History:  Diagnosis Date  . Chronic kidney disease    kidney stones  . Diabetes mellitus without complication ([place])   . Hyperlipidemia   . Hypertension   . Tinea pedis     Past Surgical History:  Procedure Laterality Date  . COLON SURGERY    . COLONOSCOPY WITH PROPOFOL N/A 09/18/2016   Procedure: COLONOSCOPY WITH PROPOFOL;  Surgeon: Lollie Sails, MD;  Location: [facility] ENDOSCOPY;  Service: Endoscopy;  Laterality: N/A;  . EYE SURGERY     cataract extraction    Social History Social History  Tobacco Use  . Smoking status: Former Research scientist (life sciences)  . Smokeless tobacco: Never Used  Substance Use Topics  . Alcohol use: No  . Drug use: No     Family History Family History  Problem Relation Age of Onset  . Cancer Mother   . Heart attack Father   . Heart attack Sister   . Heart disease Sister   . Diabetes Brother     Allergies  Allergen Reactions  . Demerol [Meperidine]     REVIEW OF SYSTEMS (Negative unless checked)  Constitutional: [] ?Weight loss  [] ?Fever  [] ?Chills Cardiac: [] ?Chest pain   [] ?Chest pressure   [] ?Palpitations   [] ?Shortness of breath when laying flat   [] ?Shortness of breath at rest   [] ?Shortness of breath with exertion. Vascular:  [x] ?Pain in legs with walking   [] ?Pain in legs at rest   [] ?Pain in legs when laying flat   [x] ?Claudication   [] ?Pain in feet when walking   [] ?Pain in feet at rest  [] ?Pain in feet when laying flat   [] ?History of DVT   [] ?Phlebitis   [] ?Swelling in legs   [] ?Varicose veins   [] ?Non-healing ulcers Pulmonary:   [] ?Uses home oxygen   [] ?Productive cough   [] ?Hemoptysis   [] ?Wheeze  [] ?COPD   [] ?Asthma Neurologic:  [] ?Dizziness  [] ?Blackouts   [] ?Seizures   [] ?History of stroke   [] ?History of TIA  [] ?Aphasia   [] ?Temporary blindness   [] ?Dysphagia   [] ?Weakness or numbness in arms   [] ?Weakness or numbness in legs Musculoskeletal:  [x] ?Arthritis   [] ?Joint swelling   [] ?Joint pain   [] ?Low back pain Hematologic:  [] ?Easy bruising  [] ?Easy bleeding   [] ?Hypercoagulable state   [] ?Anemic  [] ?Hepatitis Gastrointestinal:  [] ?Blood in stool   [] ?Vomiting blood  [] ?Gastroesophageal reflux/heartburn   [] ?Abdominal pain Genitourinary:  [x] ?Chronic kidney disease   [] ?Difficult urination  [] ?Frequent urination  [] ?Burning with urination   [] ?Hematuria Skin:  [] ?Rashes   [] ?Ulcers   [] ?Wounds Psychological:  [] ?History of anxiety   [] ? History of major depression.  Physical Examination  BP 126/80 (BP Location: Left Arm)   Pulse 62   Resp 16   Ht 5\' 9"  (1.753 m)   Wt 215 lb (97.5 kg)   BMI 31.75 kg/m  Gen:  WD/WN, NAD Head: [place]/AT, No temporalis wasting. Ear/Nose/Throat: Hearing grossly intact, nares w/o erythema or drainage Eyes: Conjunctiva clear. Sclera non-icteric Neck: Supple.  Trachea midline Pulmonary:  Good air movement, no use of accessory muscles.  Cardiac: RRR, no JVD Vascular:  Vessel Right Left  Radial Palpable Palpable                          PT  1+ palpable Palpable  DP Palpable  1+ palpable   Gastrointestinal: soft, non-tender/non-distended. No guarding/reflex.  Musculoskeletal: M/S 5/5 throughout.  No deformity or atrophy.  No significant lower extremity edema. Neurologic: Sensation grossly intact in extremities.  Symmetrical.  Speech is fluent.  Psychiatric: Judgment intact, Mood & affect appropriate for  pt's clinical situation. Dermatologic: No rashes or ulcers noted.  No cellulitis or open wounds.       Labs No results found for this or any previous visit (from the past 2160 hour(s)).  Radiology No results found.  Assessment/Plan Diabetes mellitus without complication (HCC) blood glucose control important in reducing the progression of atherosclerotic disease. Also, involved in wound healing. On appropriate medications.   Hypertension blood pressure control important in reducing the progression of atherosclerotic disease.  On appropriate oral medications.   CKD (chronic kidney disease) stage 3, GFR 30-59 ml/min (HCC) We will try to avoid any contrast and perform noninvasive studies unless he develops debilitating symptoms  Claudication [facility]) It is encouraging that his symptoms are gotten better with some medication changes, but I think reassessment with ABIs is certainly a reasonable option to ensure that significant peripheral arterial disease has not developed in the interim.  We will see him back with noninvasive studies in the near future.    Leotis Pain, MD  10/15/2018 4:35 PM    This note was created with Dragon medical transcription system.  Any errors from dictation are purely unintentional

## 2018-10-15 NOTE — Patient Instructions (Signed)
Peripheral Vascular Disease  Peripheral vascular disease (PVD) is a disease of the blood vessels that are not part of your heart and brain. A simple term for PVD is poor circulation. In most cases, PVD narrows the blood vessels that carry blood from your heart to the rest of your body. This can reduce the supply of blood to your arms, legs, and internal organs, like your stomach or kidneys. However, PVD most often affects a person's lower legs and feet. Without treatment, PVD tends to get worse. PVD can also lead to acute ischemic limb. This is when an arm or leg suddenly cannot get enough blood. This is a medical emergency. Follow these instructions at home: Lifestyle  Do not use any products that contain nicotine or tobacco, such as cigarettes and e-cigarettes. If you need help quitting, ask your doctor.  Lose weight if you are overweight. Or, stay at a healthy weight as told by your doctor.  Eat a diet that is low in fat and cholesterol. If you need help, ask your doctor.  Exercise regularly. Ask your doctor for activities that are right for you. General instructions  Take over-the-counter and prescription medicines only as told by your doctor.  Take good care of your feet: ? Wear comfortable shoes that fit well. ? Check your feet often for any cuts or sores.  Keep all follow-up visits as told by your doctor This is important. Contact a doctor if:  You have cramps in your legs when you walk.  You have leg pain when you are at rest.  You have coldness in a leg or foot.  Your skin changes.  You are unable to get or have an erection (erectile dysfunction).  You have cuts or sores on your feet that do not heal. Get help right away if:  Your arm or leg turns cold, numb, and blue.  Your arms or legs become red, warm, swollen, painful, or numb.  You have chest pain.  You have trouble breathing.  You suddenly have weakness in your face, arm, or leg.  You become very  confused or you cannot speak.  You suddenly have a very bad headache.  You suddenly cannot see. Summary  Peripheral vascular disease (PVD) is a disease of the blood vessels.  A simple term for PVD is poor circulation. Without treatment, PVD tends to get worse.  Treatment may include exercise, low fat and low cholesterol diet, and quitting smoking. This information is not intended to replace advice given to you by your health care provider. Make sure you discuss any questions you have with your health care provider. Document Released: 04/12/2009 Document Revised: 12/29/2016 Document Reviewed: 02/24/2016 Elsevier Patient Education  2020 [employer]  

## 2018-10-25 ENCOUNTER — Ambulatory Visit (INDEPENDENT_AMBULATORY_CARE_PROVIDER_SITE_OTHER): Payer: Medicare Other | Admitting: Vascular Surgery

## 2018-10-29 ENCOUNTER — Ambulatory Visit (INDEPENDENT_AMBULATORY_CARE_PROVIDER_SITE_OTHER): Payer: Medicare Other | Admitting: Vascular Surgery

## 2018-11-11 ENCOUNTER — Other Ambulatory Visit (INDEPENDENT_AMBULATORY_CARE_PROVIDER_SITE_OTHER): Payer: Self-pay | Admitting: Vascular Surgery

## 2018-11-11 DIAGNOSIS — I739 Peripheral vascular disease, unspecified: Secondary | ICD-10-CM

## 2018-11-12 ENCOUNTER — Other Ambulatory Visit: Payer: Self-pay

## 2018-11-12 ENCOUNTER — Ambulatory Visit (INDEPENDENT_AMBULATORY_CARE_PROVIDER_SITE_OTHER): Payer: Medicare Other

## 2018-11-12 ENCOUNTER — Ambulatory Visit (INDEPENDENT_AMBULATORY_CARE_PROVIDER_SITE_OTHER): Payer: Medicare Other | Admitting: Vascular Surgery

## 2018-11-12 DIAGNOSIS — I739 Peripheral vascular disease, unspecified: Secondary | ICD-10-CM

## 2018-11-26 ENCOUNTER — Encounter (INDEPENDENT_AMBULATORY_CARE_PROVIDER_SITE_OTHER): Payer: Self-pay | Admitting: Vascular Surgery

## 2018-11-26 ENCOUNTER — Other Ambulatory Visit: Payer: Self-pay

## 2018-11-26 ENCOUNTER — Ambulatory Visit (INDEPENDENT_AMBULATORY_CARE_PROVIDER_SITE_OTHER): Payer: Medicare Other | Admitting: Vascular Surgery

## 2018-11-26 VITALS — BP 145/79 | HR 61 | Resp 16 | Ht 69.0 in | Wt 216.0 lb

## 2018-11-26 DIAGNOSIS — N183 Chronic kidney disease, stage 3 unspecified: Secondary | ICD-10-CM | POA: Diagnosis not present

## 2018-11-26 DIAGNOSIS — I10 Essential (primary) hypertension: Secondary | ICD-10-CM

## 2018-11-26 DIAGNOSIS — E785 Hyperlipidemia, unspecified: Secondary | ICD-10-CM | POA: Diagnosis not present

## 2018-11-26 DIAGNOSIS — E119 Type 2 diabetes mellitus without complications: Secondary | ICD-10-CM

## 2018-11-26 DIAGNOSIS — I739 Peripheral vascular disease, unspecified: Secondary | ICD-10-CM

## 2018-11-26 NOTE — Assessment & Plan Note (Signed)
His noninvasive studies today demonstrate normal triphasic waveforms and normal ABIs of greater than 1.1 bilaterally with no evidence of arterial insufficiency.  His symptoms are likely neuropathic in nature.  No role for invasive vascular evaluation at this time.  Return to clinic as needed.

## 2018-11-26 NOTE — Progress Notes (Signed)
MRN : [id number]  Thomas Sampson is a 73 y.o. (02-17-1945) male who presents with chief complaint of  Chief Complaint  Patient presents with   Follow-up     ultrasound  .  History of Present Illness: Patient returns today in follow up of his leg pain after non-invasive studies earlier this month.  His pain remains intermittent and not necessarily related activities.  There is no clear inciting event or causative factor for his pain.  His noninvasive studies today demonstrate normal triphasic waveforms and normal ABIs of greater than 1.1 bilaterally with no evidence of arterial insufficiency.  Current Outpatient Medications  Medication Sig Dispense Refill   Alpha-Lipoic Acid 600 MG TABS Take by mouth.     amLODipine (NORVASC) 2.5 MG tablet Take 2.5 mg by mouth daily.     aspirin EC 81 MG tablet Take by mouth.     Cholecalciferol 2000 units CAPS Take 2,000 Units by mouth daily.     cholestyramine (QUESTRAN) 4 GM/DOSE powder Take by mouth 2 (two) times daily with a meal.     cyanocobalamin (,VITAMIN B-12,) 1000 MCG/ML injection Inject 73ml IM once very 4 weeks then once a month for 4 months.     cyanocobalamin 1000 MCG tablet Take 1,000 mcg by mouth daily.     glipiZIDE (GLUCOTROL XL) 10 MG 24 hr tablet Take 10 mg by mouth at bedtime.     glucose blood (ONETOUCH ULTRA) test strip Use 2 (two) times daily Dx E11.9     hydrochlorothiazide (HYDRODIURIL) 12.5 MG tablet Take 12.5 mg by mouth daily.     insulin aspart (NOVOLOG) 100 UNIT/ML injection Inject 26 Units into the skin AC breakfast. take 26 units before breakfast and 22 units before supper     insulin aspart protamine - aspart (NOVOLOG 70/30 MIX) (70-30) 100 UNIT/ML FlexPen Inject 24 units subcutaneously before breakfast and 28 units before supper     Insulin Pen Needle (BD PEN NEEDLE NANO U/F) 32G X 4 MM MISC USE WITH INJECTIONS 2 TIMES DAILY     lisinopril (PRINIVIL,ZESTRIL) 40 MG tablet Take 40 mg by mouth daily. Take  20 mg twice a day     metFORMIN (GLUCOPHAGE-XR) 500 MG 24 hr tablet Take 1,000 mg by mouth 2 (two) times daily.     metoprolol succinate (TOPROL-XL) 100 MG 24 hr tablet Take 100 mg by mouth daily. Take with or immediately following a meal.     pantoprazole (PROTONIX) 40 MG tablet Take 40 mg by mouth daily.     gabapentin (NEURONTIN) 300 MG capsule Take by mouth 3 (three) times daily.      No current facility-administered medications for this visit.     Past Medical History:  Diagnosis Date   Chronic kidney disease    kidney stones   Diabetes mellitus without complication ([place])    Hyperlipidemia    Hypertension    Tinea pedis     Past Surgical History:  Procedure Laterality Date   COLON SURGERY     COLONOSCOPY WITH PROPOFOL N/A 09/18/2016   Procedure: COLONOSCOPY WITH PROPOFOL;  Surgeon: Lollie Sails, MD;  Location: [facility] ENDOSCOPY;  Service: Endoscopy;  Laterality: N/A;   EYE SURGERY     cataract extraction    Social History       Tobacco Use   Smoking status: Former Smoker   Smokeless tobacco: Never Used  Substance Use Topics   Alcohol use: No   Drug use: No  Family History      Family History  Problem Relation Age of Onset   Cancer Mother    Heart attack Father    Heart attack Sister    Heart disease Sister    Diabetes Brother         Allergies  Allergen Reactions   Demerol [Meperidine]     REVIEW OF SYSTEMS(Negative unless checked)  Constitutional: [] ??Weight loss[] ??Fever[] ??Chills Cardiac:[] ??Chest pain[] ??Chest pressure[] ??Palpitations [] ??Shortness of breath when laying flat [] ??Shortness of breath at rest [] ??Shortness of breath with exertion. Vascular: [x] ??Pain in legs with walking[] ??Pain in legsat rest[] ??Pain in legs when laying flat [x] ??Claudication [] ??Pain in feet when walking [] ??Pain in feet at rest [] ??Pain in feet when laying flat [] ??History of DVT  [] ??Phlebitis [] ??Swelling in legs [] ??Varicose veins [] ??Non-healing ulcers Pulmonary: [] ??Uses home oxygen [] ??Productive cough[] ??Hemoptysis [] ??Wheeze [] ??COPD [] ??Asthma Neurologic: [] ??Dizziness [] ??Blackouts [] ??Seizures [] ??History of stroke [] ??History of TIA[] ??Aphasia [] ??Temporary blindness[] ??Dysphagia [] ??Weaknessor numbness in arms [] ??Weakness or numbnessin legs Musculoskeletal: [x] ??Arthritis [] ??Joint swelling [] ??Joint pain [] ??Low back pain Hematologic:[] ??Easy bruising[] ??Easy bleeding [] ??Hypercoagulable state [] ??Anemic [] ??Hepatitis Gastrointestinal:[] ??Blood in stool[] ??Vomiting blood[] ??Gastroesophageal reflux/heartburn[] ??Abdominal pain Genitourinary: [x] ??Chronic kidney disease [] ??Difficulturination [] ??Frequenturination [] ??Burning with urination[] ??Hematuria Skin: [] ??Rashes [] ??Ulcers [] ??Wounds Psychological: [] ??History of anxiety[] ??History of major depression.    Physical Examination  BP (!) 145/79 (BP Location: Left Arm)    Pulse 61    Resp 16    Ht 5\' 9"  (1.753 m)    Wt 216 lb (98 kg)    BMI 31.90 kg/m  Gen:  WD/WN, NAD Head: [place]/AT, No temporalis wasting. Ear/Nose/Throat: Hearing grossly intact, nares w/o erythema or drainage Eyes: Conjunctiva clear. Sclera non-icteric Neck: Supple.  Trachea midline Pulmonary:  Good air movement, no use of accessory muscles.  Cardiac: RRR, no JVD Vascular:  Vessel Right Left  Radial Palpable Palpable                          PT Palpable Palpable  DP Palpable Palpable   Gastrointestinal: soft, non-tender/non-distended. No guarding/reflex.  Musculoskeletal: M/S 5/5 throughout.  No deformity or atrophy.  No significant lower extremity edema. Neurologic: Sensation grossly intact in extremities.  Symmetrical.  Speech is fluent.  Psychiatric: Judgment intact, Mood & affect appropriate for pt's clinical situation. Dermatologic:  No rashes or ulcers noted.  No cellulitis or open wounds.       Labs No results found for this or any previous visit (from the past 2160 hour(s)).  Radiology Burnard Bunting With/wo Tbi  Result Date: 11/12/2018 LOWER EXTREMITY DOPPLER STUDY Indications: Claudication, and rest pain.  Comparison Study: 10/17/2017 Performing Technologist: Charlane Ferretti RT (R)(VS)  Examination Guidelines: A complete evaluation includes at minimum, Doppler waveform signals and systolic blood pressure reading at the level of bilateral brachial, anterior tibial, and posterior tibial arteries, when vessel segments are accessible. Bilateral testing is considered an integral part of a complete examination. Photoelectric Plethysmograph (PPG) waveforms and toe systolic pressure readings are included as required and additional duplex testing as needed. Limited examinations for reoccurring indications may be performed as noted.  ABI Findings: +---------+------------------+-----+---------+--------+  Right     Rt Pressure (mmHg) Index Waveform  Comment   +---------+------------------+-----+---------+--------+  Brachial  149                                          +---------+------------------+-----+---------+--------+  ATA       180  1.21  triphasic           +---------+------------------+-----+---------+--------+  PTA       186                1.25  triphasic           +---------+------------------+-----+---------+--------+  Great Toe 138                0.93  Normal              +---------+------------------+-----+---------+--------+ +---------+------------------+-----+---------+-------+  Left      Lt Pressure (mmHg) Index Waveform  Comment  +---------+------------------+-----+---------+-------+  Brachial  145                                         +---------+------------------+-----+---------+-------+  ATA       166                1.11  triphasic          +---------+------------------+-----+---------+-------+  PTA       180                 1.21  triphasic          +---------+------------------+-----+---------+-------+  Great Toe 148                0.99  Normal             +---------+------------------+-----+---------+-------+ +-------+-----------+-----------+------------+------------+  ABI/TBI Today's ABI Today's TBI Previous ABI Previous TBI  +-------+-----------+-----------+------------+------------+  Right   1.25        .93         1.23         .83           +-------+-----------+-----------+------------+------------+  Left    1.21        .99         1.21         .89           +-------+-----------+-----------+------------+------------+ Bilateral ABIs appear essentially unchanged compared to prior study on 10/17/2017. Bilateral TBIs appear essentially unchanged compared to prior study on 10/17/2017.  Summary: Right: Resting right ankle-brachial index is within normal range. No evidence of significant right lower extremity arterial disease. The right toe-brachial index is normal. Left: Resting left ankle-brachial index is within normal range. No evidence of significant left lower extremity arterial disease. The left toe-brachial index is normal.  *See table(s) above for measurements and observations.  Electronically signed by Leotis Pain MD on 11/12/2018 at 3:31:53 PM.   Final     Assessment/Plan Diabetes mellitus without complication ([place]) blood glucose control important in reducing the progression of atherosclerotic disease. Also, involved in wound healing. On appropriate medications.   Hypertension blood pressure control important in reducing the progression of atherosclerotic disease. On appropriate oral medications.   CKD (chronic kidney disease) stage 3, GFR 30-59 ml/min ([facility]) We will try to avoid any contrast and perform noninvasive studies unless he develops debilitating symptoms  Claudication ([place])  His noninvasive studies today demonstrate normal triphasic waveforms and normal ABIs of greater than 1.1 bilaterally  with no evidence of arterial insufficiency.  His symptoms are likely neuropathic in nature.  No role for invasive vascular evaluation at this time.  Return to clinic as needed.    Leotis Pain, MD  11/26/2018 1:41 PM    This note was created with Dragon medical transcription  system.  Any errors from dictation are purely unintentional

## 2020-05-10 ENCOUNTER — Inpatient Hospital Stay (HOSPITAL_COMMUNITY)
Admission: EM | Admit: 2020-05-10 | Discharge: 2020-05-14 | DRG: 023 | Disposition: A | Discharge status: Rehabilitation Facility | Payer: Medicare Other | Attending: Neurology | Admitting: Neurology

## 2020-05-10 ENCOUNTER — Emergency Department (HOSPITAL_COMMUNITY): Payer: Medicare Other | Admitting: Anesthesiology

## 2020-05-10 ENCOUNTER — Observation Stay: Payer: Medicare Other

## 2020-05-10 ENCOUNTER — Inpatient Hospital Stay (HOSPITAL_COMMUNITY): Payer: Medicare Other

## 2020-05-10 ENCOUNTER — Encounter: Payer: Self-pay | Admitting: Emergency Medicine

## 2020-05-10 ENCOUNTER — Observation Stay
Admission: EM | Admit: 2020-05-10 | Discharge: 2020-05-10 | Disposition: A | Discharge status: Home / Self Care | Payer: Medicare Other | Attending: Internal Medicine | Admitting: Internal Medicine

## 2020-05-10 ENCOUNTER — Emergency Department (HOSPITAL_COMMUNITY): Payer: Medicare Other

## 2020-05-10 ENCOUNTER — Inpatient Hospital Stay: Admit: 2020-05-10 | Payer: Medicare Other

## 2020-05-10 ENCOUNTER — Emergency Department: Payer: Medicare Other

## 2020-05-10 ENCOUNTER — Encounter (HOSPITAL_COMMUNITY)
Admission: EM | Disposition: A | Discharge status: Rehabilitation Facility | Payer: Self-pay | Source: Home / Self Care | Attending: Neurology

## 2020-05-10 ENCOUNTER — Other Ambulatory Visit: Payer: Self-pay

## 2020-05-10 DIAGNOSIS — I69354 Hemiplegia and hemiparesis following cerebral infarction affecting left non-dominant side: Secondary | ICD-10-CM | POA: Diagnosis present

## 2020-05-10 DIAGNOSIS — G936 Cerebral edema: Secondary | ICD-10-CM | POA: Diagnosis not present

## 2020-05-10 DIAGNOSIS — E1142 Type 2 diabetes mellitus with diabetic polyneuropathy: Secondary | ICD-10-CM | POA: Diagnosis present

## 2020-05-10 DIAGNOSIS — Z87891 Personal history of nicotine dependence: Secondary | ICD-10-CM | POA: Insufficient documentation

## 2020-05-10 DIAGNOSIS — I69322 Dysarthria following cerebral infarction: Secondary | ICD-10-CM | POA: Diagnosis not present

## 2020-05-10 DIAGNOSIS — H5340 Unspecified visual field defects: Secondary | ICD-10-CM | POA: Diagnosis present

## 2020-05-10 DIAGNOSIS — Z7984 Long term (current) use of oral hypoglycemic drugs: Secondary | ICD-10-CM | POA: Diagnosis not present

## 2020-05-10 DIAGNOSIS — I129 Hypertensive chronic kidney disease with stage 1 through stage 4 chronic kidney disease, or unspecified chronic kidney disease: Secondary | ICD-10-CM | POA: Insufficient documentation

## 2020-05-10 DIAGNOSIS — I69391 Dysphagia following cerebral infarction: Secondary | ICD-10-CM | POA: Diagnosis not present

## 2020-05-10 DIAGNOSIS — R531 Weakness: Secondary | ICD-10-CM | POA: Diagnosis present

## 2020-05-10 DIAGNOSIS — G459 Transient cerebral ischemic attack, unspecified: Secondary | ICD-10-CM

## 2020-05-10 DIAGNOSIS — Z8249 Family history of ischemic heart disease and other diseases of the circulatory system: Secondary | ICD-10-CM

## 2020-05-10 DIAGNOSIS — Z79899 Other long term (current) drug therapy: Secondary | ICD-10-CM | POA: Diagnosis not present

## 2020-05-10 DIAGNOSIS — Z20822 Contact with and (suspected) exposure to covid-19: Secondary | ICD-10-CM | POA: Diagnosis present

## 2020-05-10 DIAGNOSIS — K219 Gastro-esophageal reflux disease without esophagitis: Secondary | ICD-10-CM | POA: Diagnosis present

## 2020-05-10 DIAGNOSIS — L538 Other specified erythematous conditions: Secondary | ICD-10-CM | POA: Diagnosis not present

## 2020-05-10 DIAGNOSIS — R270 Ataxia, unspecified: Secondary | ICD-10-CM | POA: Diagnosis present

## 2020-05-10 DIAGNOSIS — I639 Cerebral infarction, unspecified: Principal | ICD-10-CM | POA: Diagnosis present

## 2020-05-10 DIAGNOSIS — E785 Hyperlipidemia, unspecified: Secondary | ICD-10-CM | POA: Diagnosis present

## 2020-05-10 DIAGNOSIS — I63511 Cerebral infarction due to unspecified occlusion or stenosis of right middle cerebral artery: Principal | ICD-10-CM | POA: Diagnosis present

## 2020-05-10 DIAGNOSIS — M7989 Other specified soft tissue disorders: Secondary | ICD-10-CM | POA: Diagnosis not present

## 2020-05-10 DIAGNOSIS — G8194 Hemiplegia, unspecified affecting left nondominant side: Secondary | ICD-10-CM | POA: Diagnosis present

## 2020-05-10 DIAGNOSIS — E1151 Type 2 diabetes mellitus with diabetic peripheral angiopathy without gangrene: Secondary | ICD-10-CM | POA: Diagnosis present

## 2020-05-10 DIAGNOSIS — E1165 Type 2 diabetes mellitus with hyperglycemia: Secondary | ICD-10-CM | POA: Diagnosis present

## 2020-05-10 DIAGNOSIS — E1122 Type 2 diabetes mellitus with diabetic chronic kidney disease: Secondary | ICD-10-CM | POA: Diagnosis present

## 2020-05-10 DIAGNOSIS — R739 Hyperglycemia, unspecified: Secondary | ICD-10-CM | POA: Diagnosis not present

## 2020-05-10 DIAGNOSIS — N184 Chronic kidney disease, stage 4 (severe): Secondary | ICD-10-CM | POA: Diagnosis present

## 2020-05-10 DIAGNOSIS — J9611 Chronic respiratory failure with hypoxia: Secondary | ICD-10-CM

## 2020-05-10 DIAGNOSIS — J9811 Atelectasis: Secondary | ICD-10-CM | POA: Diagnosis not present

## 2020-05-10 DIAGNOSIS — Z833 Family history of diabetes mellitus: Secondary | ICD-10-CM | POA: Diagnosis not present

## 2020-05-10 DIAGNOSIS — Z794 Long term (current) use of insulin: Secondary | ICD-10-CM | POA: Insufficient documentation

## 2020-05-10 DIAGNOSIS — I609 Nontraumatic subarachnoid hemorrhage, unspecified: Secondary | ICD-10-CM | POA: Diagnosis not present

## 2020-05-10 DIAGNOSIS — E669 Obesity, unspecified: Secondary | ICD-10-CM | POA: Diagnosis present

## 2020-05-10 DIAGNOSIS — T17908A Unspecified foreign body in respiratory tract, part unspecified causing other injury, initial encounter: Secondary | ICD-10-CM

## 2020-05-10 DIAGNOSIS — Z9889 Other specified postprocedural states: Secondary | ICD-10-CM | POA: Diagnosis not present

## 2020-05-10 DIAGNOSIS — E1169 Type 2 diabetes mellitus with other specified complication: Secondary | ICD-10-CM | POA: Diagnosis not present

## 2020-05-10 DIAGNOSIS — E7800 Pure hypercholesterolemia, unspecified: Secondary | ICD-10-CM | POA: Diagnosis not present

## 2020-05-10 DIAGNOSIS — R1310 Dysphagia, unspecified: Secondary | ICD-10-CM | POA: Diagnosis present

## 2020-05-10 DIAGNOSIS — I63411 Cerebral infarction due to embolism of right middle cerebral artery: Secondary | ICD-10-CM | POA: Diagnosis not present

## 2020-05-10 DIAGNOSIS — K912 Postsurgical malabsorption, not elsewhere classified: Secondary | ICD-10-CM | POA: Diagnosis present

## 2020-05-10 DIAGNOSIS — N179 Acute kidney failure, unspecified: Secondary | ICD-10-CM | POA: Diagnosis present

## 2020-05-10 DIAGNOSIS — R29710 NIHSS score 10: Secondary | ICD-10-CM | POA: Diagnosis present

## 2020-05-10 DIAGNOSIS — G8929 Other chronic pain: Secondary | ICD-10-CM | POA: Diagnosis present

## 2020-05-10 DIAGNOSIS — I6601 Occlusion and stenosis of right middle cerebral artery: Secondary | ICD-10-CM | POA: Diagnosis present

## 2020-05-10 DIAGNOSIS — Z6832 Body mass index (BMI) 32.0-32.9, adult: Secondary | ICD-10-CM

## 2020-05-10 DIAGNOSIS — E876 Hypokalemia: Secondary | ICD-10-CM | POA: Diagnosis not present

## 2020-05-10 DIAGNOSIS — Z7982 Long term (current) use of aspirin: Secondary | ICD-10-CM | POA: Diagnosis not present

## 2020-05-10 DIAGNOSIS — Z978 Presence of other specified devices: Secondary | ICD-10-CM

## 2020-05-10 DIAGNOSIS — Z885 Allergy status to narcotic agent status: Secondary | ICD-10-CM | POA: Diagnosis not present

## 2020-05-10 DIAGNOSIS — I63311 Cerebral infarction due to thrombosis of right middle cerebral artery: Secondary | ICD-10-CM | POA: Diagnosis not present

## 2020-05-10 DIAGNOSIS — J9601 Acute respiratory failure with hypoxia: Secondary | ICD-10-CM | POA: Diagnosis not present

## 2020-05-10 DIAGNOSIS — I6389 Other cerebral infarction: Secondary | ICD-10-CM | POA: Diagnosis not present

## 2020-05-10 DIAGNOSIS — W19XXXA Unspecified fall, initial encounter: Secondary | ICD-10-CM | POA: Diagnosis present

## 2020-05-10 DIAGNOSIS — I10 Essential (primary) hypertension: Secondary | ICD-10-CM | POA: Diagnosis present

## 2020-05-10 DIAGNOSIS — F482 Pseudobulbar affect: Secondary | ICD-10-CM | POA: Diagnosis present

## 2020-05-10 DIAGNOSIS — R4701 Aphasia: Secondary | ICD-10-CM | POA: Diagnosis present

## 2020-05-10 DIAGNOSIS — E1129 Type 2 diabetes mellitus with other diabetic kidney complication: Secondary | ICD-10-CM | POA: Diagnosis present

## 2020-05-10 DIAGNOSIS — M940 Chondrocostal junction syndrome [Tietze]: Secondary | ICD-10-CM | POA: Diagnosis not present

## 2020-05-10 DIAGNOSIS — G4700 Insomnia, unspecified: Secondary | ICD-10-CM | POA: Diagnosis not present

## 2020-05-10 DIAGNOSIS — R471 Dysarthria and anarthria: Secondary | ICD-10-CM | POA: Diagnosis present

## 2020-05-10 DIAGNOSIS — Z4659 Encounter for fitting and adjustment of other gastrointestinal appliance and device: Secondary | ICD-10-CM

## 2020-05-10 DIAGNOSIS — M19011 Primary osteoarthritis, right shoulder: Secondary | ICD-10-CM | POA: Diagnosis present

## 2020-05-10 DIAGNOSIS — B370 Candidal stomatitis: Secondary | ICD-10-CM | POA: Diagnosis present

## 2020-05-10 DIAGNOSIS — I952 Hypotension due to drugs: Secondary | ICD-10-CM | POA: Diagnosis not present

## 2020-05-10 HISTORY — PX: IR CT HEAD LTD: IMG2386

## 2020-05-10 HISTORY — DX: Cerebral infarction due to unspecified occlusion or stenosis of right middle cerebral artery: I63.511

## 2020-05-10 HISTORY — PX: IR PERCUTANEOUS ART THROMBECTOMY/INFUSION INTRACRANIAL INC DIAG ANGIO: IMG6087

## 2020-05-10 HISTORY — PX: RADIOLOGY WITH ANESTHESIA: SHX6223

## 2020-05-10 LAB — CBC
HCT: 42.6 % (ref 39.0–52.0)
Hemoglobin: 14.6 g/dL (ref 13.0–17.0)
MCH: 31.6 pg (ref 26.0–34.0)
MCHC: 34.3 g/dL (ref 30.0–36.0)
MCV: 92.2 fL (ref 80.0–100.0)
Platelets: 270 10*3/uL (ref 150–400)
RBC: 4.62 MIL/uL (ref 4.22–5.81)
RDW: 13 % (ref 11.5–15.5)
WBC: 9 10*3/uL (ref 4.0–10.5)
nRBC: 0 % (ref 0.0–0.2)

## 2020-05-10 LAB — DIFFERENTIAL
Abs Immature Granulocytes: 0.14 10*3/uL — ABNORMAL HIGH (ref 0.00–0.07)
Basophils Absolute: 0.1 10*3/uL (ref 0.0–0.1)
Basophils Relative: 1 %
Eosinophils Absolute: 0.2 10*3/uL (ref 0.0–0.5)
Eosinophils Relative: 2 %
Immature Granulocytes: 2 %
Lymphocytes Relative: 17 %
Lymphs Abs: 1.5 10*3/uL (ref 0.7–4.0)
Monocytes Absolute: 0.7 10*3/uL (ref 0.1–1.0)
Monocytes Relative: 8 %
Neutro Abs: 6.5 10*3/uL (ref 1.7–7.7)
Neutrophils Relative %: 70 %

## 2020-05-10 LAB — POCT I-STAT 7, (LYTES, BLD GAS, ICA,H+H)
Acid-base deficit: 2 mmol/L (ref 0.0–2.0)
Bicarbonate: 24.4 mmol/L (ref 20.0–28.0)
Calcium, Ion: 1.21 mmol/L (ref 1.15–1.40)
HCT: 40 % (ref 39.0–52.0)
Hemoglobin: 13.6 g/dL (ref 13.0–17.0)
O2 Saturation: 93 %
Patient temperature: 98.6
Potassium: 4 mmol/L (ref 3.5–5.1)
Sodium: 140 mmol/L (ref 135–145)
TCO2: 26 mmol/L (ref 22–32)
pCO2 arterial: 48.4 mmHg — ABNORMAL HIGH (ref 32.0–48.0)
pH, Arterial: 7.31 — ABNORMAL LOW (ref 7.350–7.450)
pO2, Arterial: 75 mmHg — ABNORMAL LOW (ref 83.0–108.0)

## 2020-05-10 LAB — LIPID PANEL
Cholesterol: 181 mg/dL (ref 0–200)
HDL: 34 mg/dL — ABNORMAL LOW (ref >40)
LDL Cholesterol: 99 mg/dL (ref 0–99)
Total CHOL/HDL Ratio: 5.3 RATIO
Triglycerides: 238 mg/dL — ABNORMAL HIGH (ref <150)
VLDL: 48 mg/dL — ABNORMAL HIGH (ref 0–40)

## 2020-05-10 LAB — HEMOGLOBIN A1C
Hgb A1c MFr Bld: 8.1 % — ABNORMAL HIGH (ref 4.8–5.6)
Hgb A1c MFr Bld: 7.8 % — ABNORMAL HIGH (ref 4.8–5.6)
Mean Plasma Glucose: 185.77 mg/dL
Mean Plasma Glucose: 177.16 mg/dL

## 2020-05-10 LAB — GLUCOSE, CAPILLARY
Glucose-Capillary: 291 mg/dL — ABNORMAL HIGH (ref 70–99)
Glucose-Capillary: 292 mg/dL — ABNORMAL HIGH (ref 70–99)
Glucose-Capillary: 386 mg/dL — ABNORMAL HIGH (ref 70–99)

## 2020-05-10 LAB — COMPREHENSIVE METABOLIC PANEL
ALT: 16 U/L (ref 0–44)
AST: 20 U/L (ref 15–41)
Albumin: 4.1 g/dL (ref 3.5–5.0)
Alkaline Phosphatase: 45 U/L (ref 38–126)
Anion gap: 10 (ref 5–15)
BUN: 31 mg/dL — ABNORMAL HIGH (ref 8–23)
CO2: 26 mmol/L (ref 22–32)
Calcium: 9.7 mg/dL (ref 8.9–10.3)
Chloride: 103 mmol/L (ref 98–111)
Creatinine, Ser: 2.26 mg/dL — ABNORMAL HIGH (ref 0.61–1.24)
GFR, Estimated: 30 mL/min — ABNORMAL LOW (ref >60)
Glucose, Bld: 207 mg/dL — ABNORMAL HIGH (ref 70–99)
Potassium: 3.7 mmol/L (ref 3.5–5.1)
Sodium: 139 mmol/L (ref 135–145)
Total Bilirubin: 0.8 mg/dL (ref 0.3–1.2)
Total Protein: 7.2 g/dL (ref 6.5–8.1)

## 2020-05-10 LAB — PROTIME-INR
INR: 1 (ref 0.8–1.2)
Prothrombin Time: 12.3 seconds (ref 11.4–15.2)

## 2020-05-10 LAB — RESP PANEL BY RT-PCR (FLU A&B, COVID) ARPGX2
Influenza A by PCR: NEGATIVE
Influenza B by PCR: NEGATIVE
SARS Coronavirus 2 by RT PCR: NEGATIVE

## 2020-05-10 LAB — APTT: aPTT: 24 seconds (ref 24–36)

## 2020-05-10 IMAGING — MR MR MRA HEAD W/O CM
1 series · 19 of 48 positions shown · non-contrast
Comparison: CT head [DATE]

CLINICAL DATA: TIA. Dysarthria and slurred speech. Mildly weak on
the left.

EXAM:
MRI HEAD WITHOUT CONTRAST
MRA HEAD WITHOUT CONTRAST
TECHNIQUE: Multiplanar, multiecho pulse sequences of the brain and surrounding
structures were obtained without intravenous contrast. Angiographic
images of the head were obtained using MRA technique without
contrast.

[Series 5: TOF · axial · 0.5mm · 0.41mm/px · z∈[-91,+5]mm · 19 of 205 slices shown]
[im 1/205]
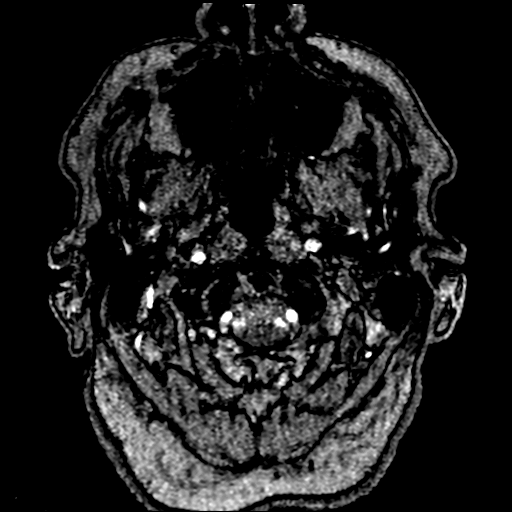
[im 5/205]
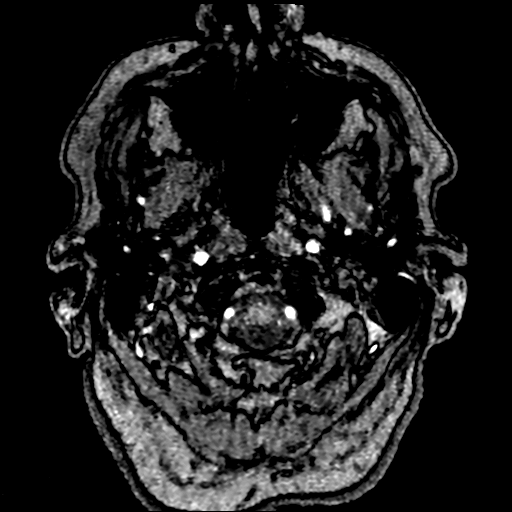
[im 9/205]
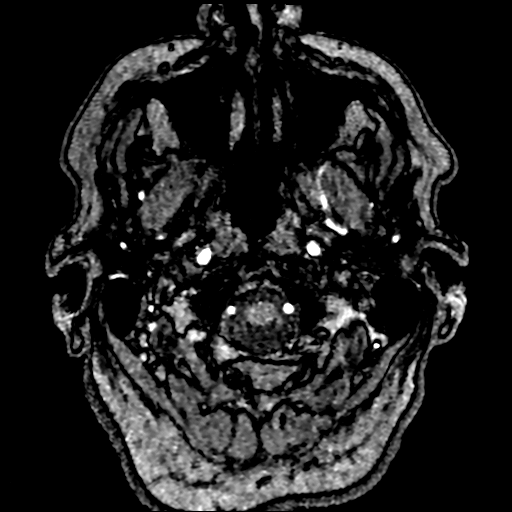
[im 14/205]
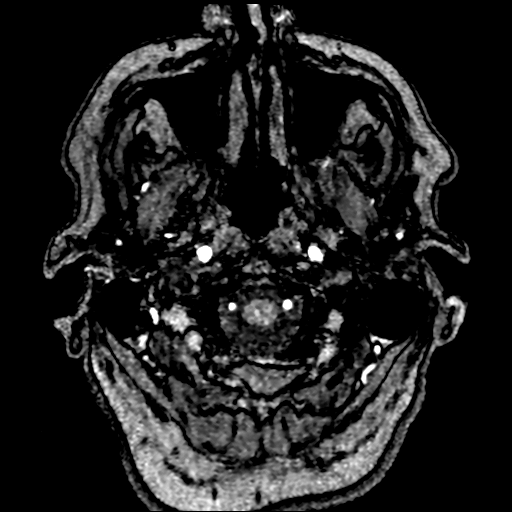
[im 18/205]
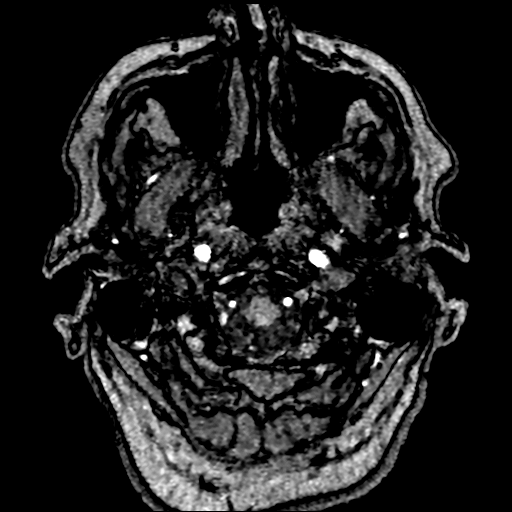
[im 22/205]
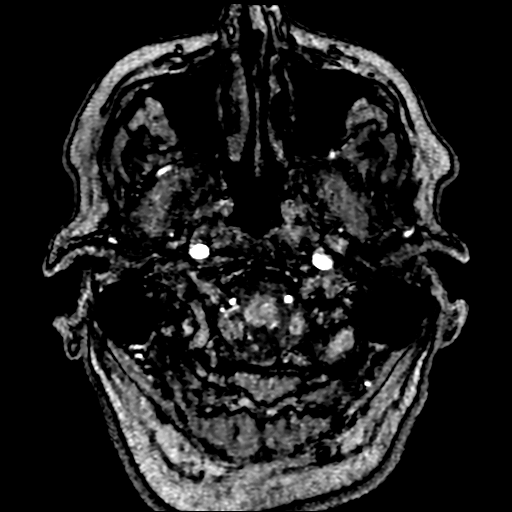
[im 27/205]
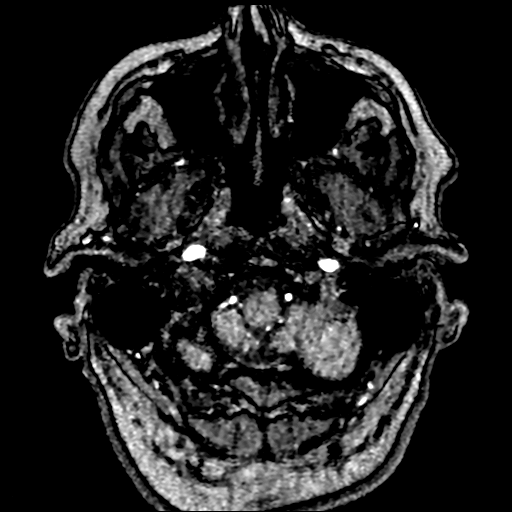
[im 31/205]
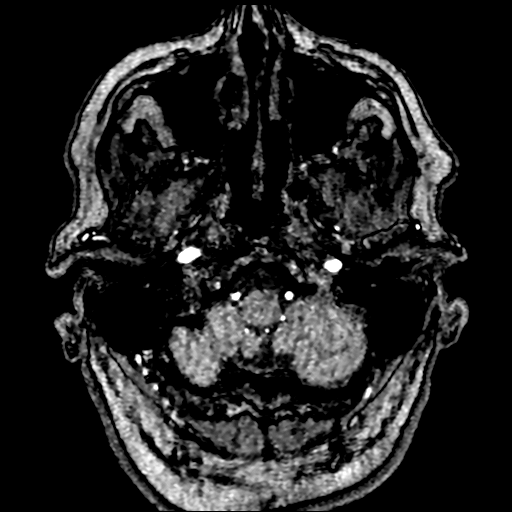
[im 35/205]
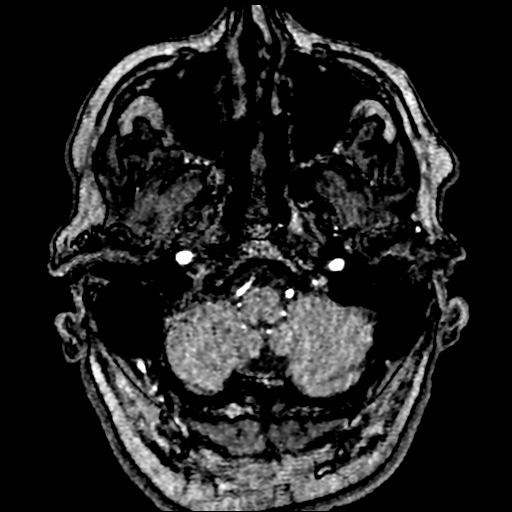
[im 40/205]
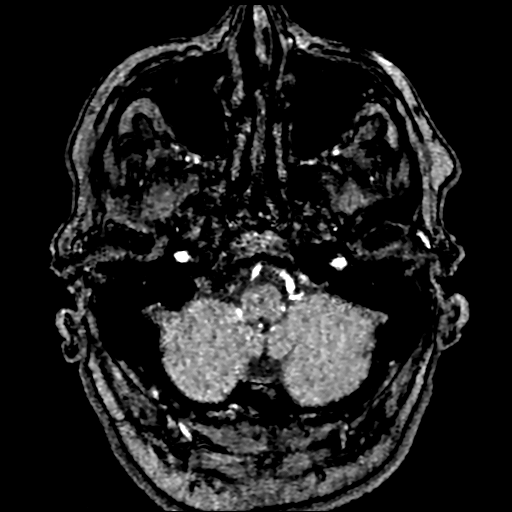
[im 44/205]
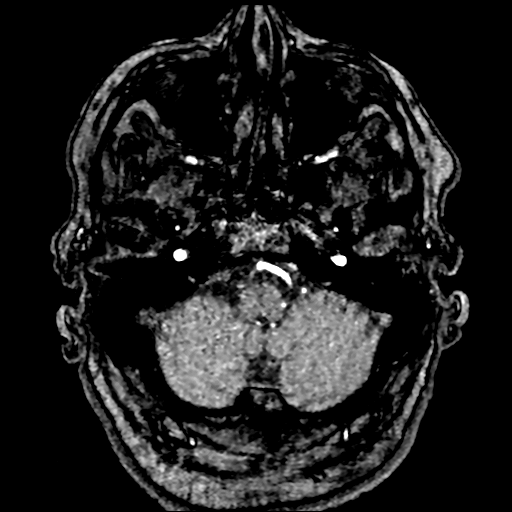
[im 66/205]
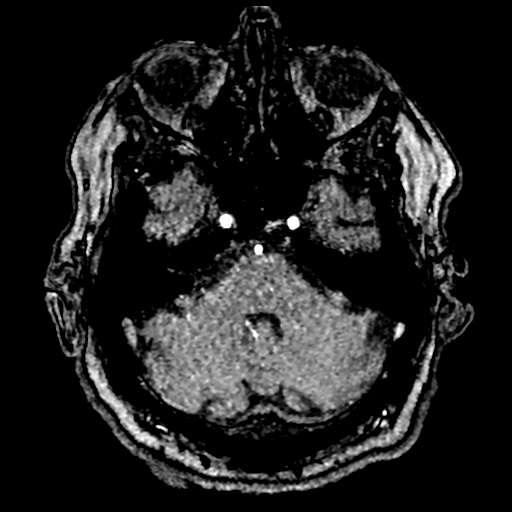
[im 92/205]
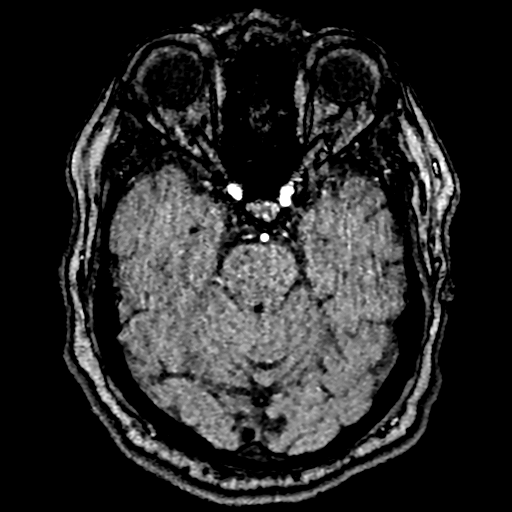
[im 105/205]
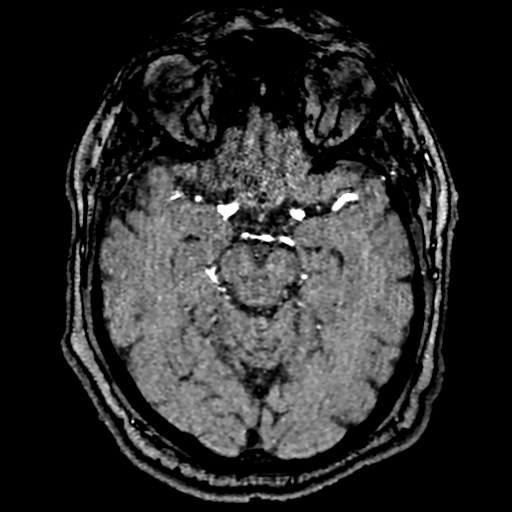
[im 118/205]
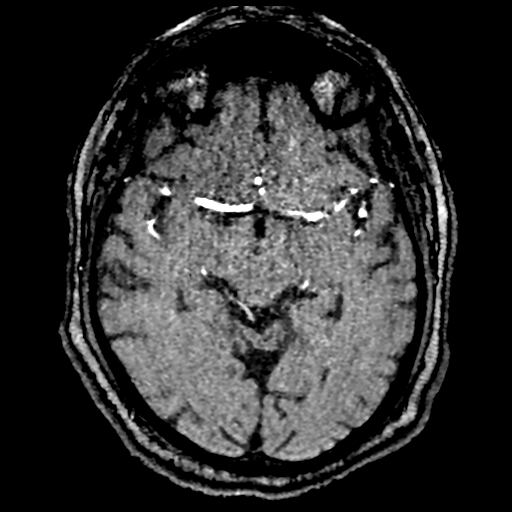
[im 144/205]
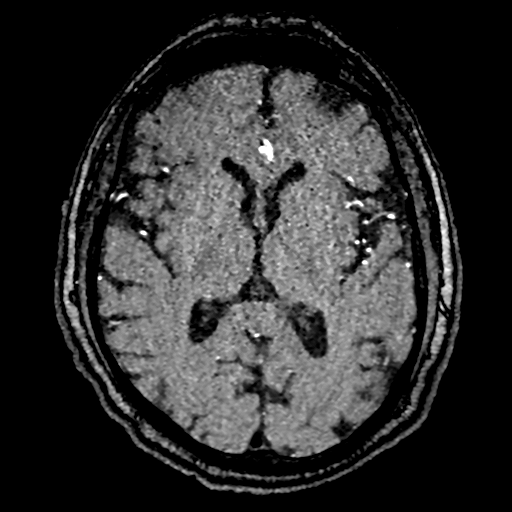
[im 170/205]
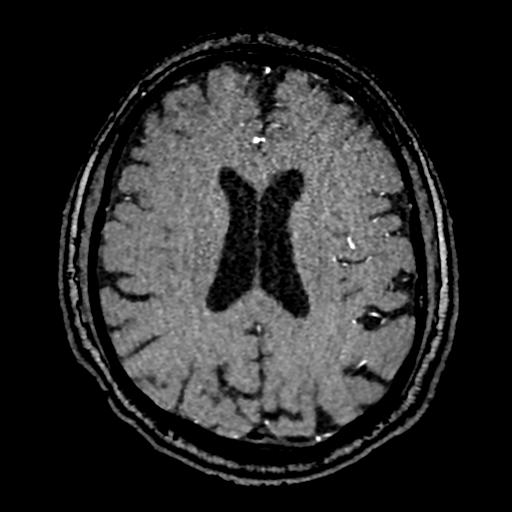
[im 174/205]
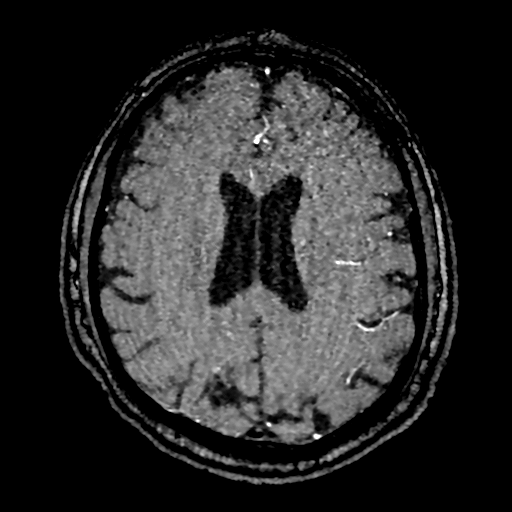
[im 196/205]
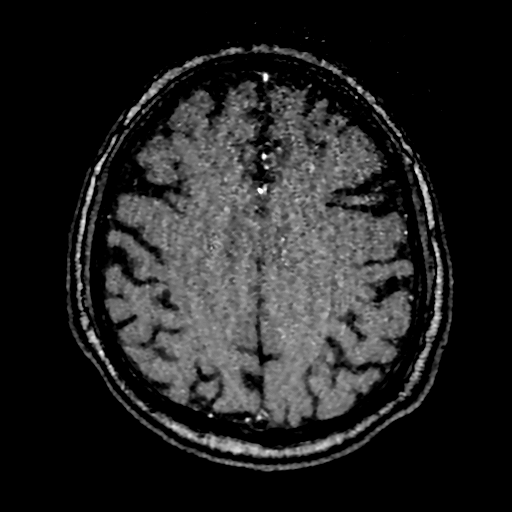

[19 of 48 positions shown; findings below may reference images not displayed]

FINDINGS: MRI HEAD FINDINGS

Brain: Acute infarct in the right insular cortex and right parietal
operculum. There is FLAIR hyperintensity in the right MCA branches
in this area due to slow flow or thrombosis. There is susceptibility
in the right sylvian fissure compatible with thrombus in the right
MCA branch.

Mild atrophy. Mild chronic microvascular ischemic change in the
white matter. Small chronic infarct in the right cerebellum.
Negative for mass lesion.

Vascular: Normal arterial flow voids at the base of brain.
Susceptibility in the right sylvian fissure with FLAIR
hyperintensity in the right MCA vessels compatible with slow flow or
occlusion

Skull and upper cervical spine: Negative

Sinuses/Orbits: Mild mucosal edema paranasal sinuses. Bilateral
cataract extraction

Other: None

MRA HEAD FINDINGS

Internal carotid artery widely patent bilaterally without stenosis.
Anterior cerebral arteries widely patent with mild irregularity.
Left middle cerebral artery widely patent with mild irregularity.

Right M1 segment patent. Occlusion of the superior division of the
right M3 vessel. Inferior division right M2 widely patent.

Posterior circulation normal without significant stenosis. Fetal
origin right posterior cerebral artery.
IMPRESSION: 1. Acute infarct right insula and parietal operculum.
2. Mild chronic microvascular ischemic change in the white matter
3. Occlusion of the right M3 branch superior division

## 2020-05-10 IMAGING — DX DG CHEST 1V PORT
1 series · 1 of 1 positions shown · non-contrast
Comparison: None.

CLINICAL DATA: Intubated, stroke

EXAM:
PORTABLE CHEST 1 VIEW

[chest ap]
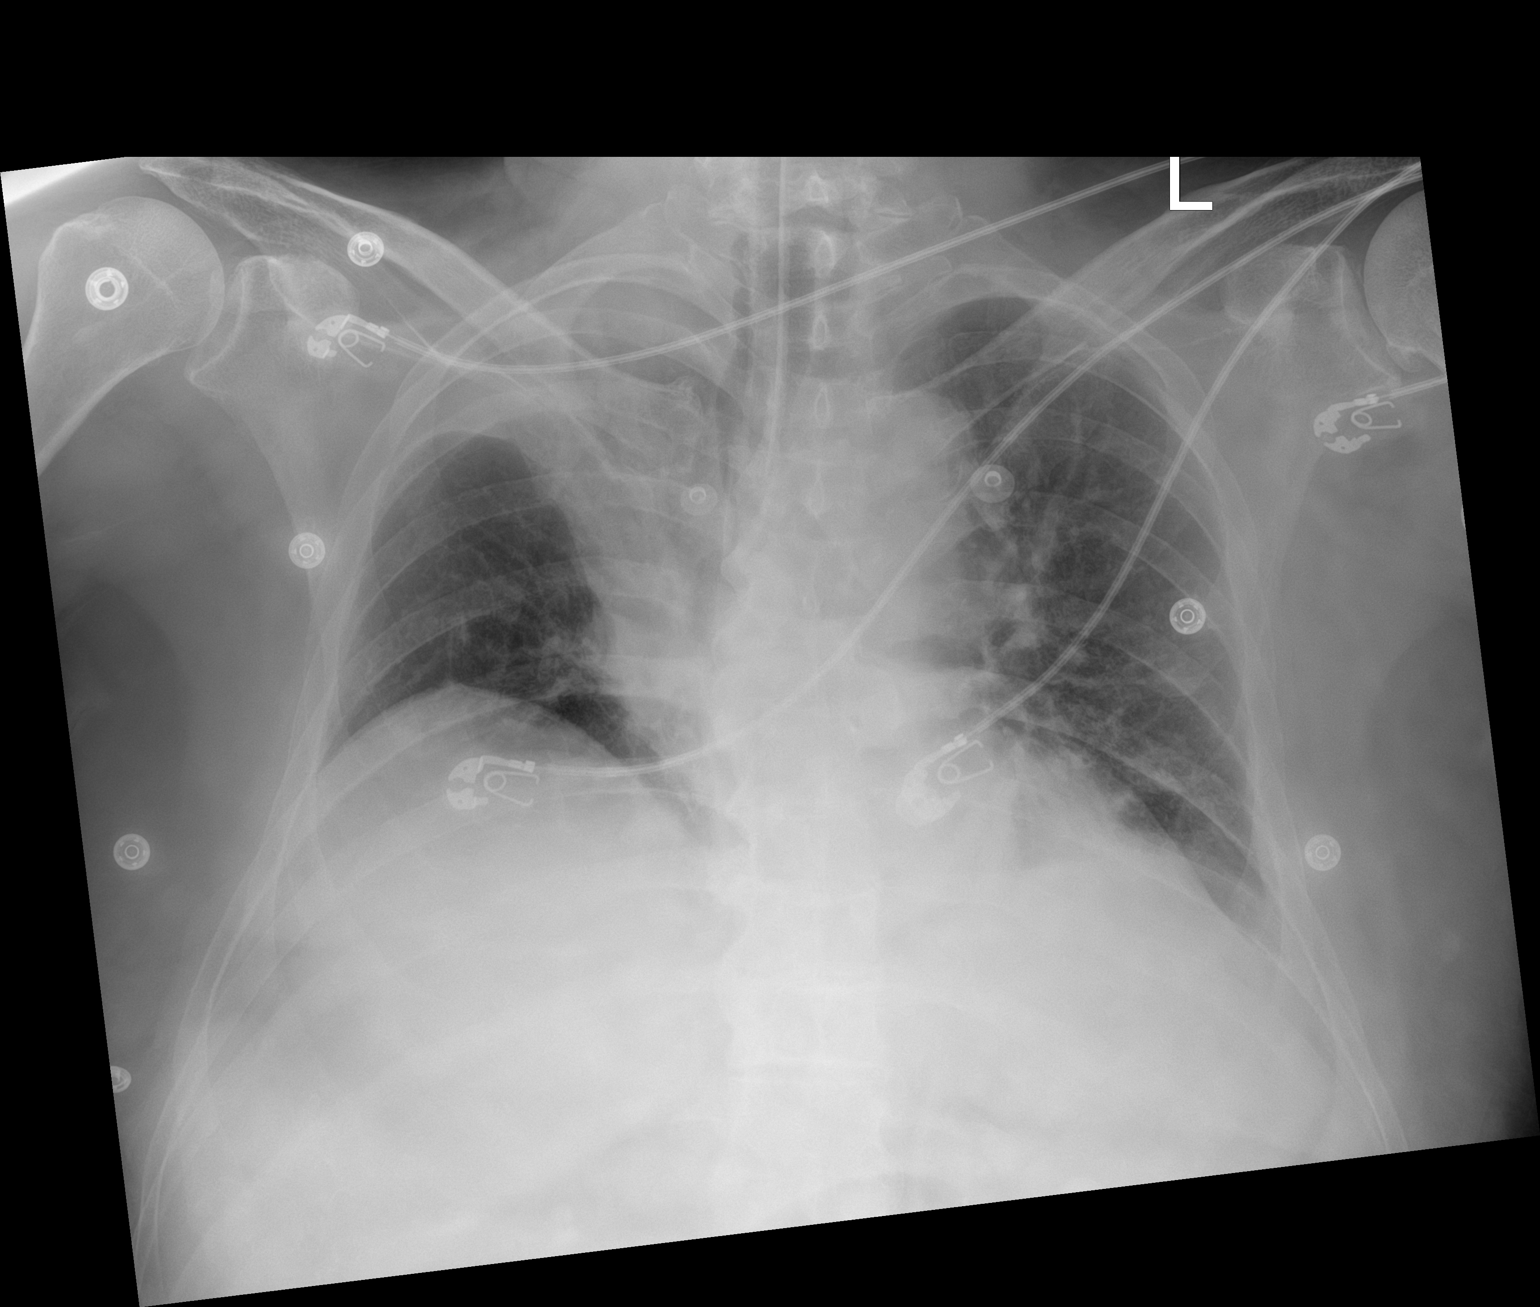

[1 of 1 positions shown; findings below may reference images not displayed]

FINDINGS: Single frontal view of the chest demonstrates endotracheal tube
overlying tracheal air column, tip approximately 1.5 cm above
carina. There is dense right upper lobe consolidation with volume
loss consistent with atelectasis. This could be due to mucous
plugging. Hypoventilatory changes are seen at the left lung base. No
effusion or pneumothorax. No acute bony abnormalities.
IMPRESSION: 1. Endotracheal tube approximately 1.5 cm above carina.
2. Dense right upper lobe consolidation and volume loss consistent
with atelectasis. This could be due to mucous plugging.
3. Hypoventilatory changes at the left lung base.

These results were called by telephone at the time of interpretation
on [DATE] at [DATE] to provider DR FRANSISCO , who verbally
acknowledged these results.

## 2020-05-10 IMAGING — DX DG CHEST 1V PORT
1 series · 1 of 1 positions shown · non-contrast
Comparison: Earlier this day.

CLINICAL DATA: Endotracheal tube present.

EXAM:
PORTABLE CHEST 1 VIEW

[chest]
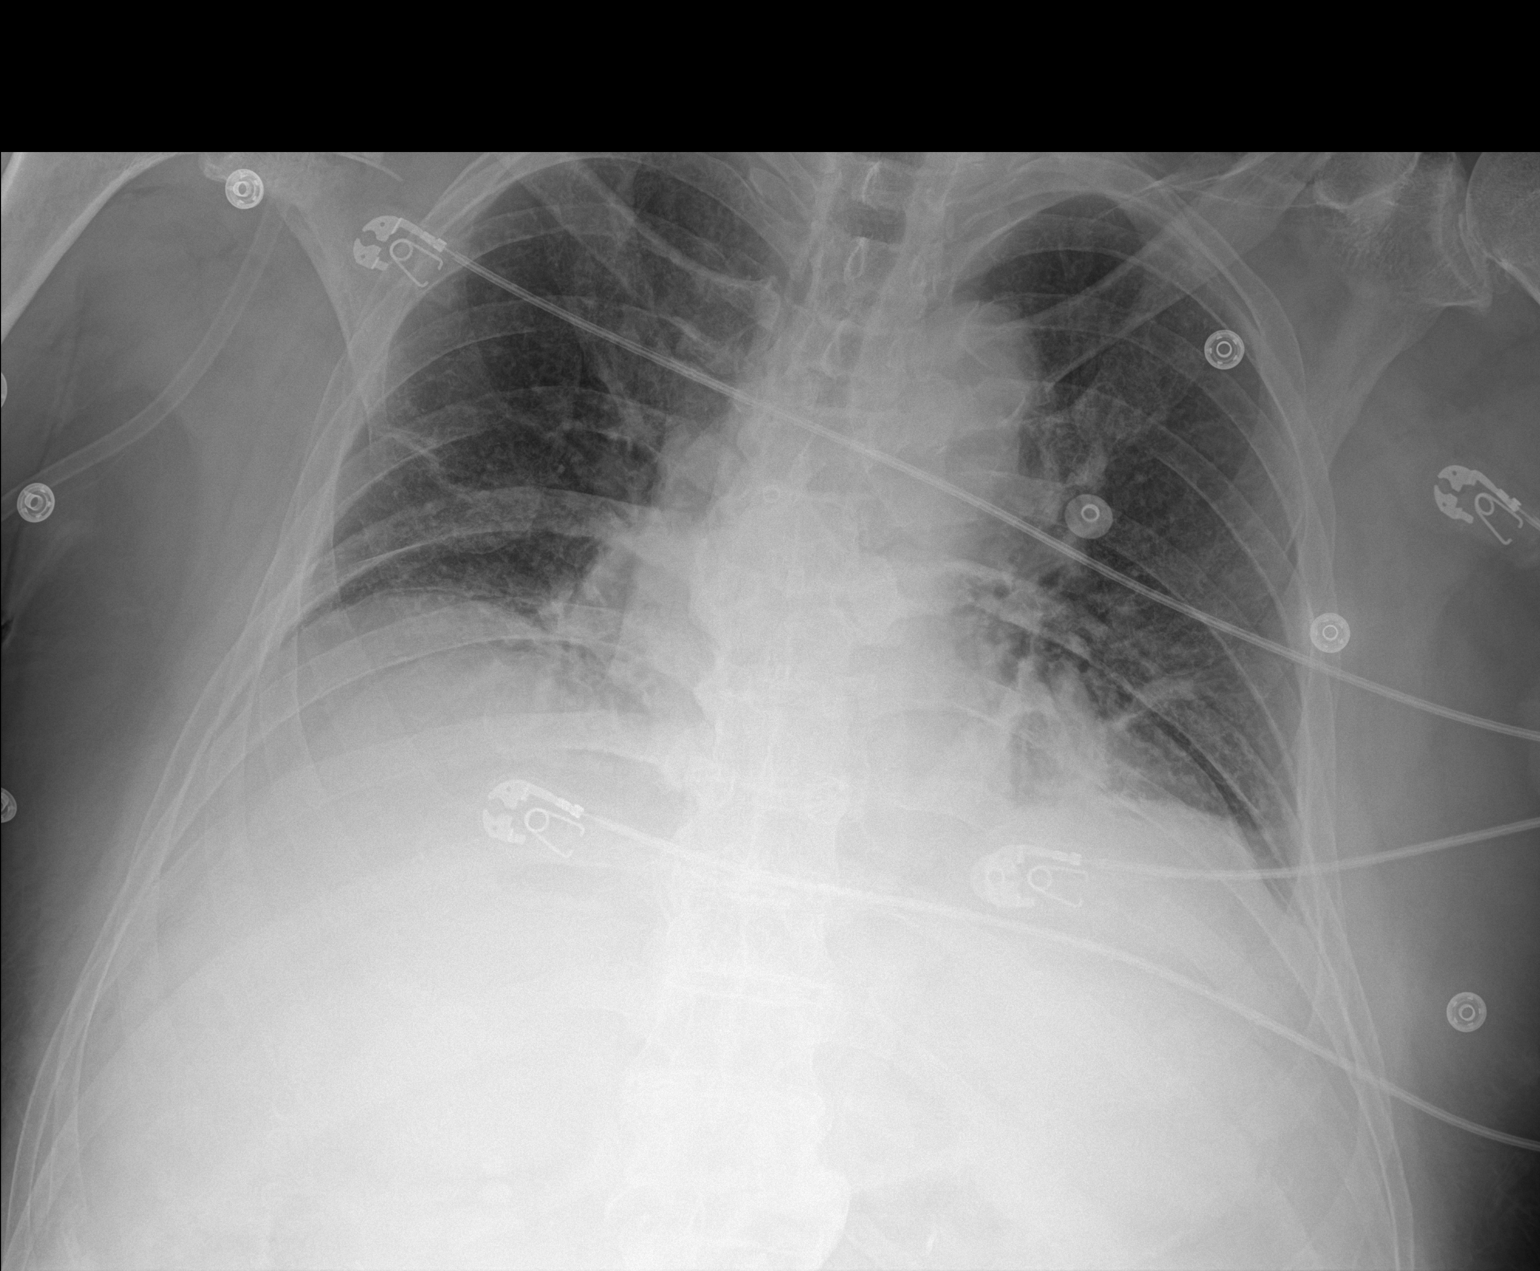

[1 of 1 positions shown; findings below may reference images not displayed]

FINDINGS: The endotracheal tube is been retracted, the tip is now 9.7 cm above
the carina. Re-expansion of the right upper lobe in the interim. Low
lung volumes with patchy bibasilar opacities, left greater than
right. No visualized pneumothorax. Stable heart size and mediastinal
contours.
IMPRESSION: 1. Endotracheal tube retracted, tip now 9.7 cm above the carina.
Advancement of 5-6 cm would place the endotracheal tube tip at the
level of the clavicular heads.
2. Re-expansion of the right upper lobe.
3. Low lung volumes with patchy bibasilar opacities, left greater
than right, likely atelectasis.

## 2020-05-10 IMAGING — XA IR PERCUTANEOUS ART THORMBECTOMY/INFUSION INTRACRANIAL INCLUDE D
1 series · 11 of 24 positions shown · IV contrast (IODINE)
Comparison: CT angiogram of head and neck [DATE].

INDICATION: New onset of right gaze deviation and left sided weakness arm
greater than leg. CT angiogram revealing occlusion of the right
middle cerebral artery superior division.

EXAM:
1. EMERGENT LARGE VESSEL OCCLUSION THROMBOLYSIS (anterior
CIRCULATION)
TECHNIQUE: Following a full explanation of the procedure along with the
potential associated complications, an informed witnessed consent
was obtained. The risks of intracranial hemorrhage of 10%, worsening
neurological deficit, ventilator dependency, death and inability to
revascularize were all reviewed in detail with the patient's spouse.

[Series 300: ir percutaneous art thrombectomy/infusio · 11 of 207 slices shown]
[im 9/207]
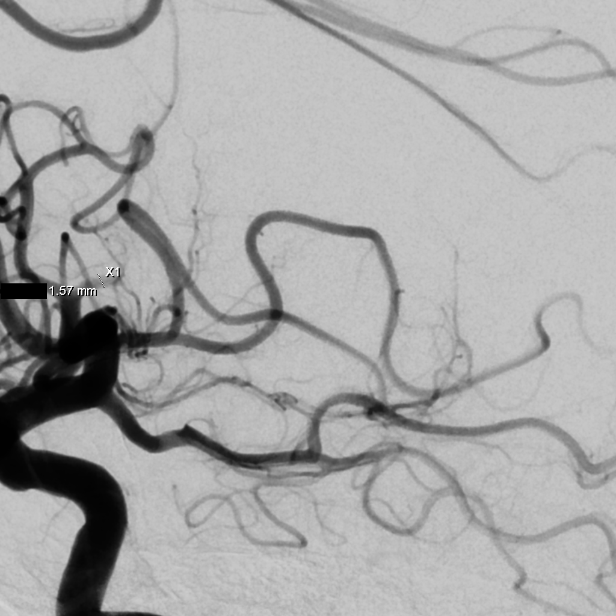
[im 27/207]
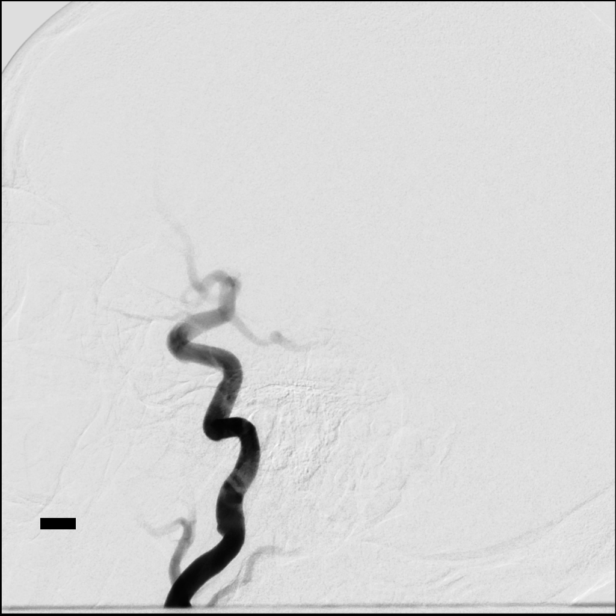
[im 45/207]
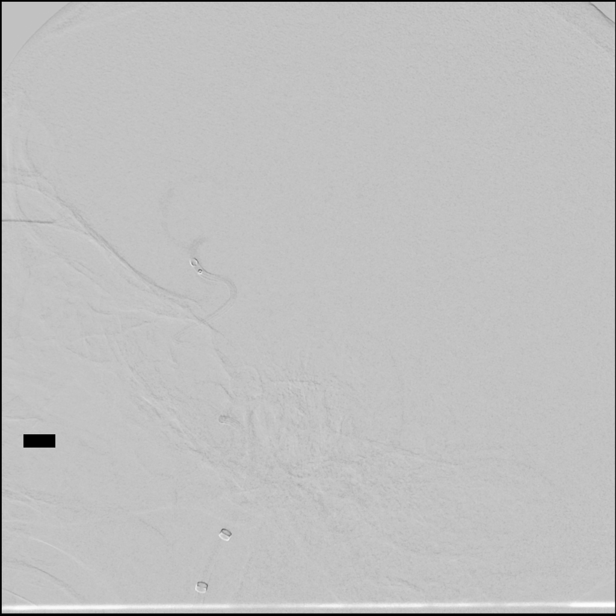
[im 63/207]
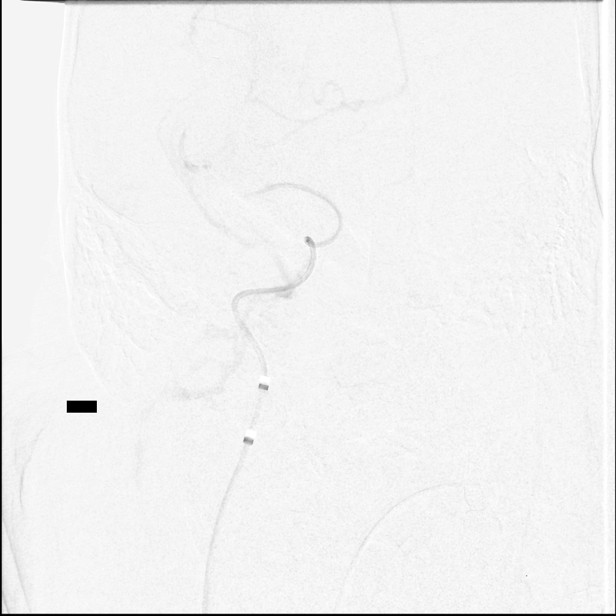
[im 81/207]
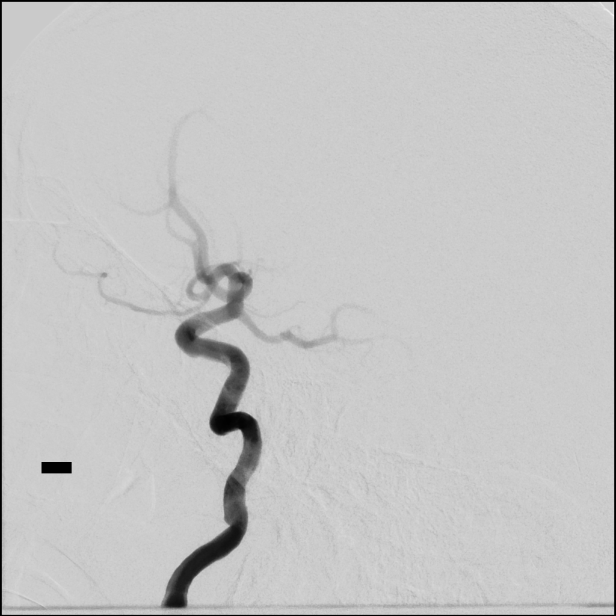
[im 108/207]
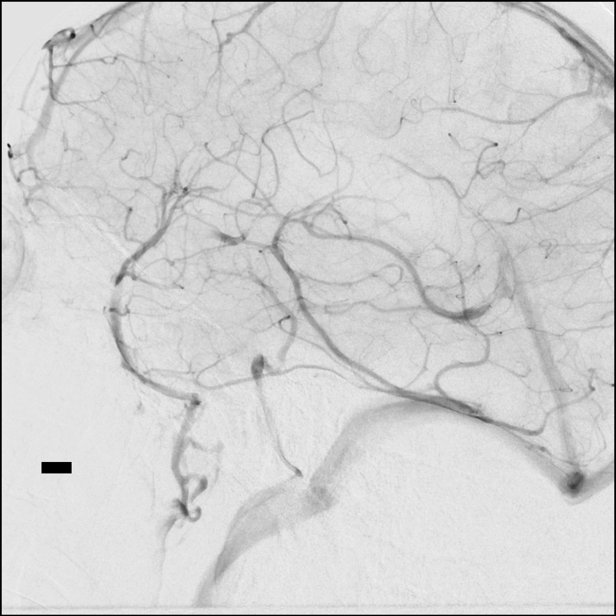
[im 126/207]
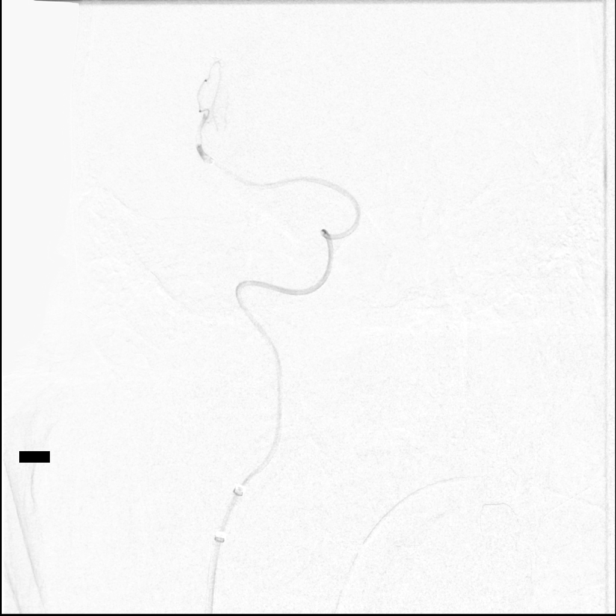
[im 144/207]
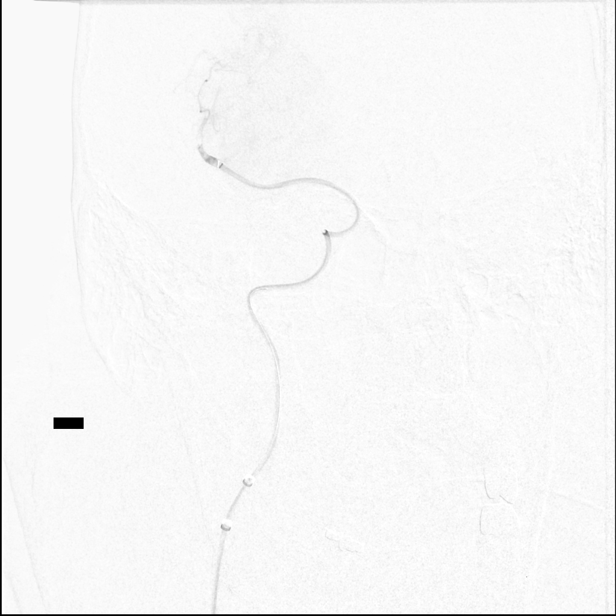
[im 162/207]
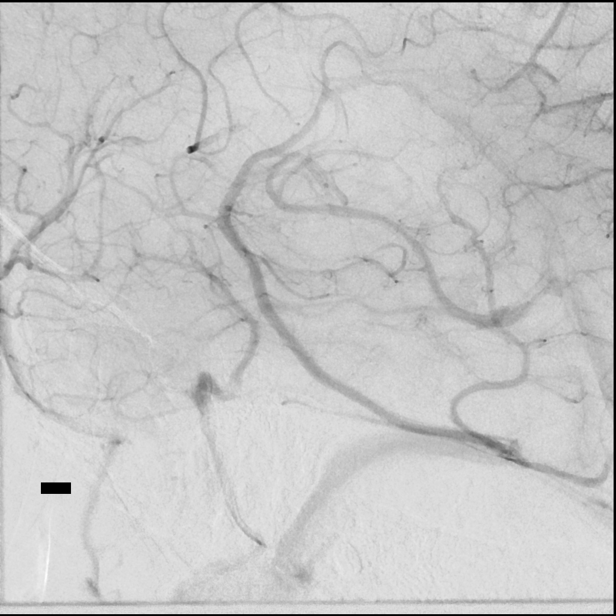
[im 180/207]
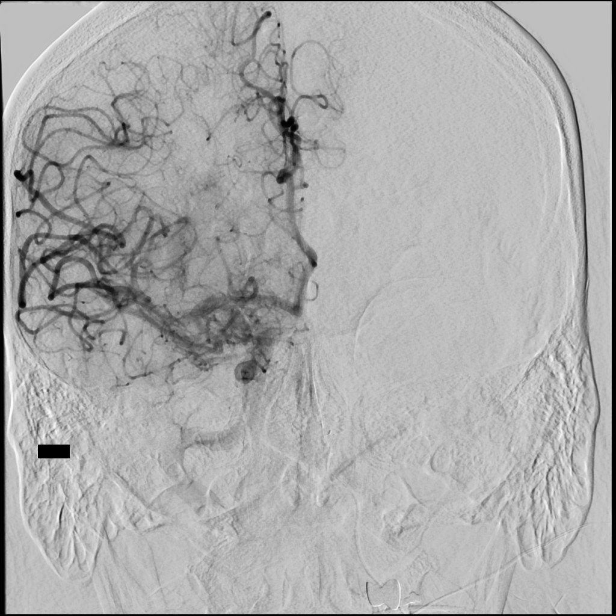
[im 198/207]
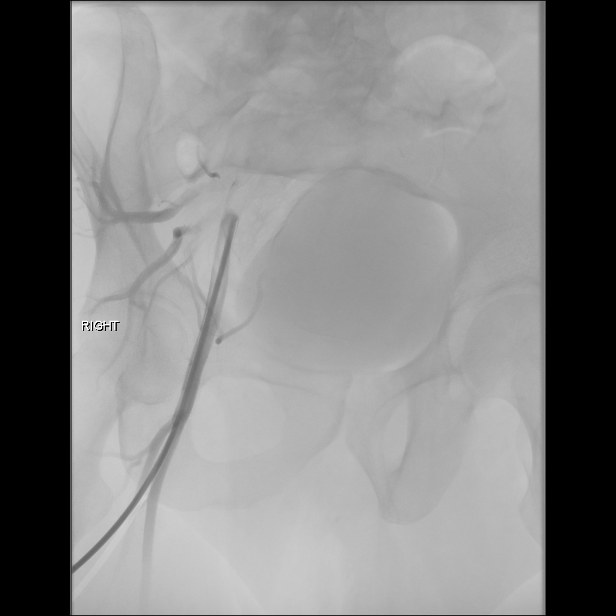

[11 of 24 positions shown; findings below may reference images not displayed]

MEDICATIONS:
Ancef 2 g IV antibiotic was administered within 1 hour of the
procedure.

ANESTHESIA/SEDATION:
General anesthesia

CONTRAST:  Isovue 300 approximately 100 mL.

FLUOROSCOPY TIME:  Fluoroscopy Time: 79 minutes 24 seconds ([0G]
mGy).

COMPLICATIONS:
None immediate.
The patient was then put under general anesthesia by the [REDACTED] at [HOSPITAL].

The right groin was prepped and draped in the usual sterile fashion.
Thereafter using modified Seldinger technique, transfemoral access
into the right common femoral artery was obtained without
difficulty. Over a 0.035 inch guidewire an 8 French Pinnacle 25 cm
sheath was inserted. Through this, and also over a 0.035 inch
guidewire a combination of a 5.5 [facility]IVERS 2 catheter inside of
the 087 balloon guide catheter was advanced over a 0.035 inch
Roadrunner guidewire and positioned without difficulty into the
right common carotid artery. The guidewire was removed. Good
aspiration was obtained from the hub of the balloon guide catheter.
FINDINGS: A gentle control arteriogram performed through the balloon guide
catheter demonstrated no evidence of spasms, dissections or of
intraluminal filling defects.

The right common carotid arteriogram demonstrates wide patency of
the right external carotid artery and its major branches.

The right internal carotid artery at the bulb to the cranial skull
base demonstrates wide patency.

The petrous, the cavernous and supraclinoid segments are widely
patent.

The right middle cerebral artery and the right anterior cerebral
artery are seen to opacify into the capillary and venous phases.

Also demonstrated is the presence of occlusion of the superior
division of the right middle cerebral artery in the M2 segment.

Right posterior communicating artery is seen opacifying the right
posterior cerebral artery distribution.

PROCEDURE:
Through the 087 balloon guide catheter in the right common carotid
artery, a combination of an 055 136 Zoom aspiration catheter with an
035 Zoom aspiration catheter was advanced over a 0.014 inch standard
Synchro micro guidewire with a J configuration to the right M1
segment.

Using a torque device, access was obtained to just proximal to the
occluded superior division M2 segment.

Access to the distal M3 M4 inferior division branch was obtained
just distal to the occluded site.

However, advancement of the 035 Zoom aspiration catheter was met
with significant resistance with change in configuration distally.
The micro guidewire, and the 035 Zoom aspiration catheter were
removed. The 055 Zoom aspiration was advanced to be just distal to
the occluded site of the superior division.

Constant aspiration was then performed via a Penumbra aspiration
device for approximately 2 minutes. Thereafter, the 055 Zoom
aspiration was retrieved and removed. A control arteriogram
performed through the balloon guide in the distal right ICA
continued to demonstrate no change in the occluded superior division
prominent branch.

No clot was noted in the aspirate or within the 055 aspiration
catheter.

Another pass was then made again using the 055 aspiration catheter
with a 162 021 microcatheter over a 0.014 inch standard Synchro
micro guidewire.

This combination was again advanced as described earlier and
positioned at the site of the occlusion. The micro guidewire was
replaced with an 016 Fathom micro guidewire. This advanced easily
through the occluded superior division occluded branch into the M4
region. This was then followed by the microcatheter. The guidewire
was removed. Good aspiration obtained from the hub of the
microcatheter. A gentle control arteriogram performed through the
microcatheter demonstrated safe position of the tip of the
microcatheter which was then connected to continuous heparinized
saline infusion.

A 3 mm x 40 mm Solitaire X retrieval device was then advanced to the
distal end of the microcatheter and deployed in the usual manner
without difficulty. The microcatheter was retrieved more proximally
into the right internal carotid artery.

The 055 Zoom aspiration was then advanced abutting against the
occluded superior division M2 segment.

With proximal flow arrest in the right internal carotid artery, and
continuous aspiration with a 20 mL syringe at the hub of the balloon
guide catheter, and constant aspiration applied at the hub of the
055 Zoom aspiration catheter for approximately 2-1/2 minutes, the
combination of the retrieval device, the microcatheter and the Zoom
055 aspiration catheter were retrieved and removed. Thick sticky
clot was seen entangled within the retrieval device, and also in the
hub of the Tuohy Borst.

A control arteriogram performed following reversal of flow arrest in
the right internal carotid artery demonstrated now complete
revascularization of 2 of the 3 main branches emanating from the
superior division.

However, there continued be a distal M3 occlusion of an anterior
perisylvian branch leading on up to the hyper perfused subcortical
frontal perisylvian region seen on the CT perfusion mapping studies.

A TICI 2C revascularization was achieved.

200 mcg of nitroglycerin was then infused through the balloon guide
catheter in the right internal carotid artery to obviate mild to
moderate spasm at the proximal superior division of the right MCA.

A final control arteriogram performed through the balloon guide in
the right common carotid artery continued to demonstrate patency of
the extracranial, and intracranial right ICA, a TICI 2C
revascularization of the right middle cerebral artery distribution.
The right anterior cerebral artery and right posterior communicating
artery demonstrate wide patency unchanged.

The balloon guide was removed. An 8 French Angio-Seal closure device
was applied for hemostasis at the right groin puncture site. Distal
pulses were Dopplerable in both feet unchanged.

A CT of the brain demonstrated focal area of hyperattenuation in the
putamen on the right side associated with moderate hyperattenuation
in the posterior right perisylvian subarachnoid space. No evidence
of mass or midline shift was noted.

The patient was left intubated for airway protection.

He was then transferred to the neuro ICU for post embolization
management.
IMPRESSION: Status post endovascular near complete revascularization of occluded
superior division of the right middle cerebral artery with 1 pass
with contact aspiration, and 1 pass with contact aspiration with a 3
mm x 40 mm Solitaire retrieval device achieving a TICI 2C
revascularization.

PLAN:
Follow-up with the referring [facility].

## 2020-05-10 IMAGING — MR MR HEAD W/O CM
11 series · 48 of 48 positions shown · non-contrast
Comparison: CT head [DATE]

CLINICAL DATA: TIA. Dysarthria and slurred speech. Mildly weak on
the left.

EXAM:
MRI HEAD WITHOUT CONTRAST
MRA HEAD WITHOUT CONTRAST
TECHNIQUE: Multiplanar, multiecho pulse sequences of the brain and surrounding
structures were obtained without intravenous contrast. Angiographic
images of the head were obtained using MRA technique without
contrast.

[Series 5: ax dwi_tracew · axial · 3.0mm · 0.65mm/px · z∈[-100,+52]mm · 5 of 48 slices shown]
[im 1/48]
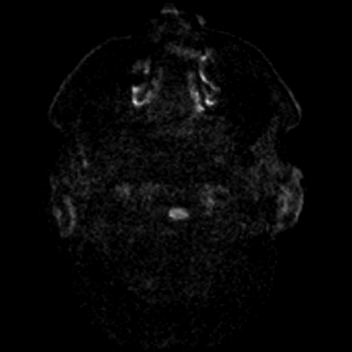
[im 12/48]
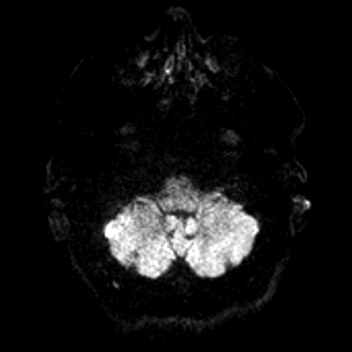
[im 24/48]
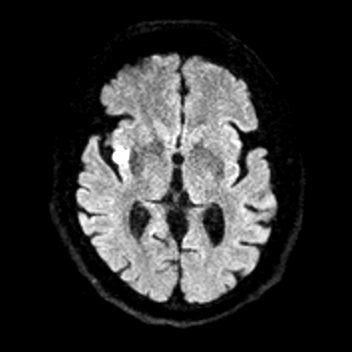
[im 36/48]
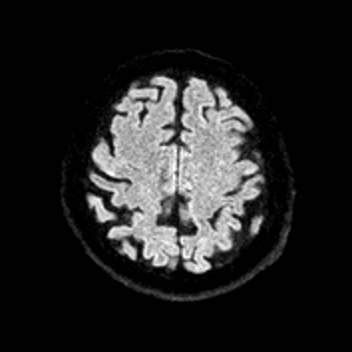
[im 48/48]
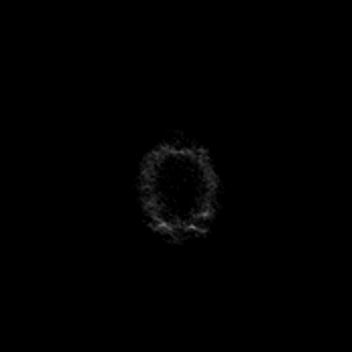

[Series 6: ax dwi_adc · axial · 3.0mm · 0.65mm/px · z∈[-100,+52]mm · 5 of 48 slices shown]
[im 1/48]
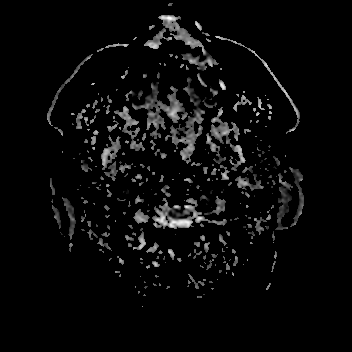
[im 12/48]
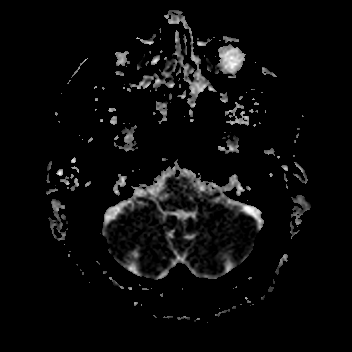
[im 24/48]
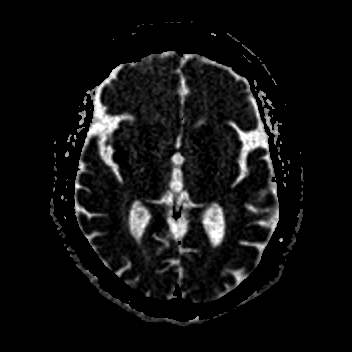
[im 36/48]
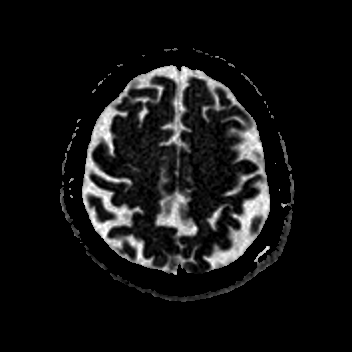
[im 48/48]
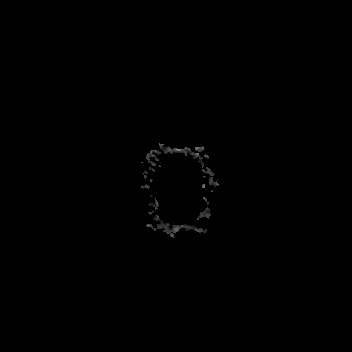

[Series 7: cor dwi_tracew · coronal · 5.0mm · 0.68mm/px · 3 of 40 slices shown]
[im 1/40]
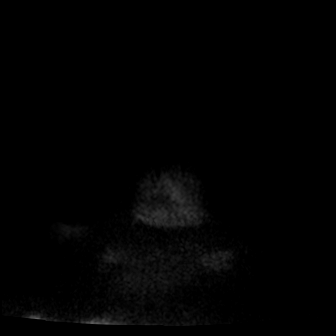
[im 20/40]
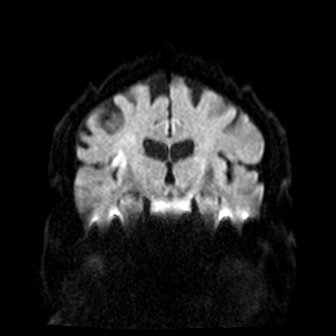
[im 40/40]
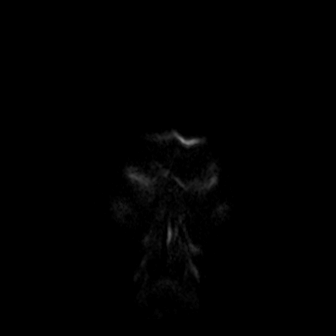

[Series 8: cor dwi_adc · coronal · 5.0mm · 0.68mm/px · 3 of 40 slices shown]
[im 1/40]
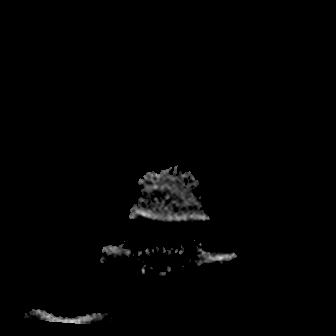
[im 20/40]
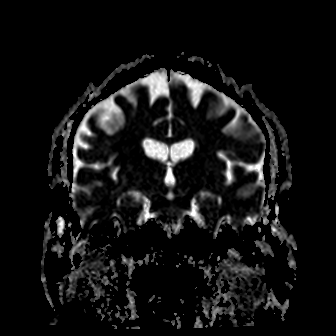
[im 40/40]
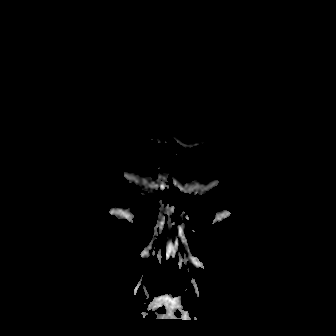

[Series 9: T1 · sagittal · 5.0mm · 0.62mm/px · 2 of 23 slices shown (1 of 2)]
[im 1/23]
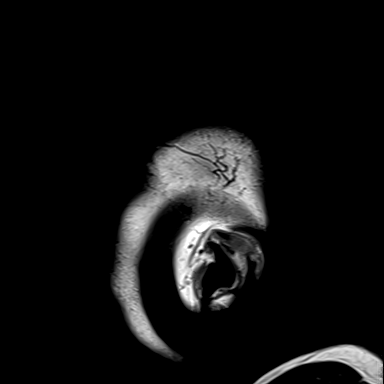
[im 23/23]
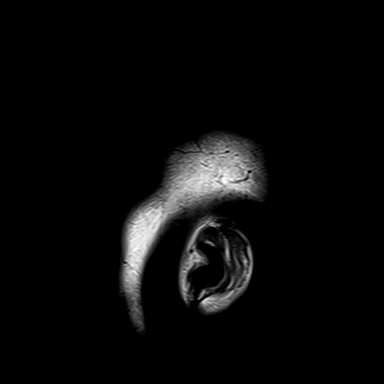

[Series 10: T2 · axial · 5.0mm · 0.53mm/px · z∈[-93,+48]mm · 2 of 25 slices shown (1 of 2)]
[im 1/25]
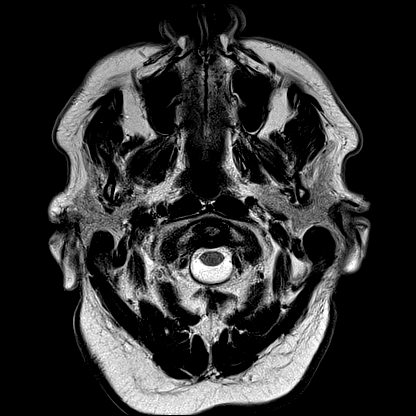
[im 25/25]
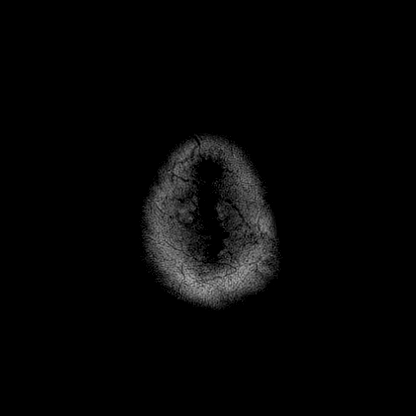

[Series 12: pha_images · axial · 3.0mm · 0.90mm/px · z∈[-95,+55]mm · 4 of 52 slices shown]
[im 1/52]
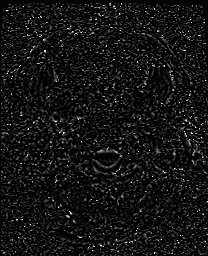
[im 18/52]
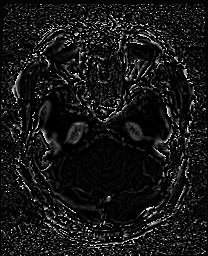
[im 35/52]
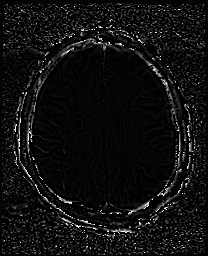
[im 52/52]
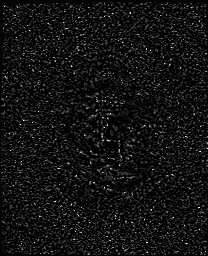

[Series 13: swi_images · axial · 3.0mm · 0.90mm/px · z∈[-95,+55]mm · 4 of 52 slices shown]
[im 1/52]
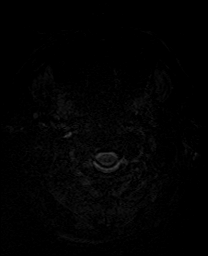
[im 18/52]
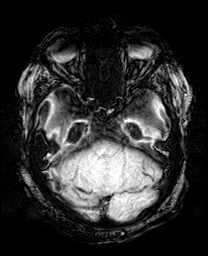
[im 35/52]
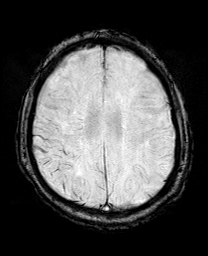
[im 52/52]
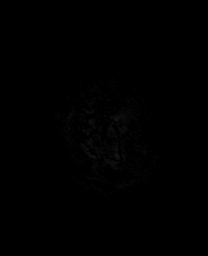

[Series 15: FLAIR · axial · 3.0mm · 0.53mm/px · z∈[-99,+60]mm · 5 of 55 slices shown]
[im 1/55]
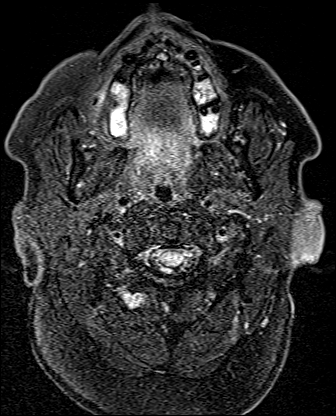
[im 14/55]
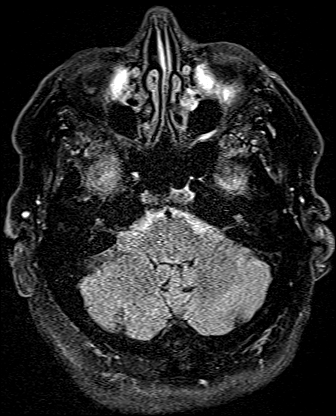
[im 28/55]
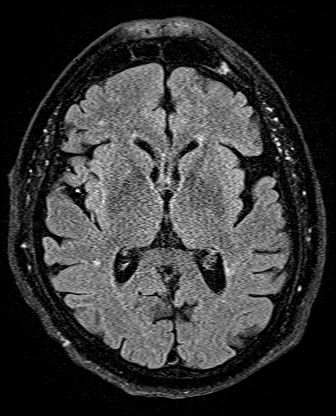
[im 41/55]
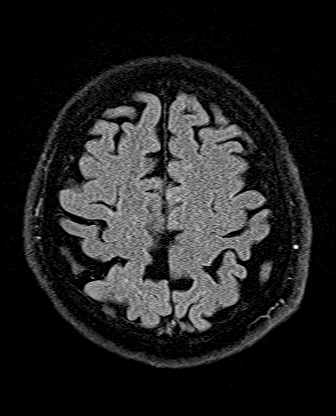
[im 55/55]
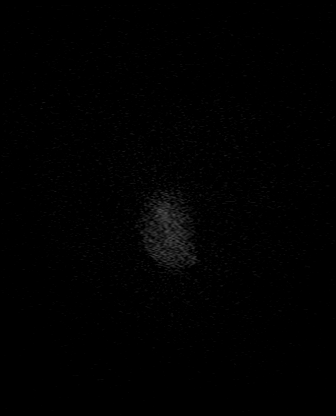

[Series 16: T1 · axial · 1.0mm · 0.98mm/px · z∈[-92,+64]mm · 13 of 160 slices shown (2 of 2)]
[im 1/160]
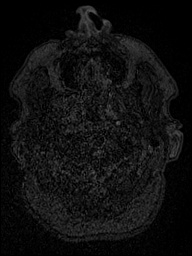
[im 14/160]
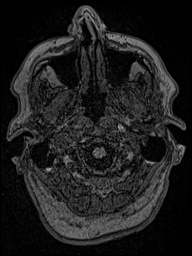
[im 27/160]
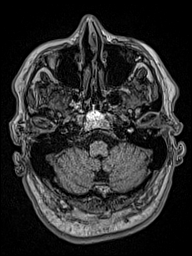
[im 40/160]
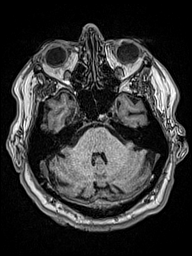
[im 54/160]
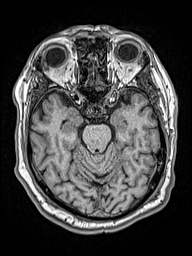
[im 67/160]
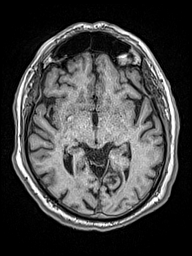
[im 80/160]
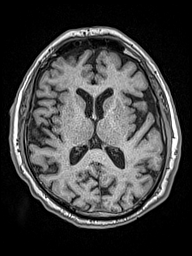
[im 93/160]
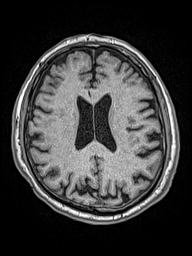
[im 107/160]
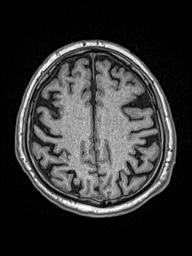
[im 120/160]
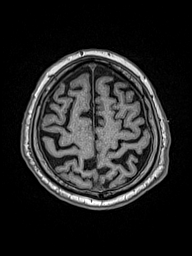
[im 133/160]
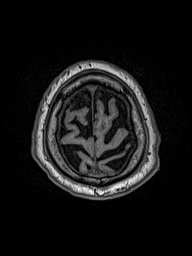
[im 146/160]
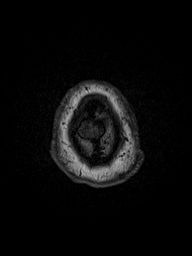
[im 160/160]
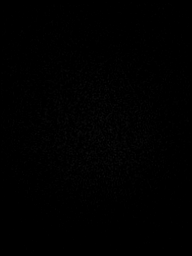

[Series 17: T2 · coronal · 5.0mm · 0.57mm/px · 2 of 29 slices shown (2 of 2)]
[im 1/29]
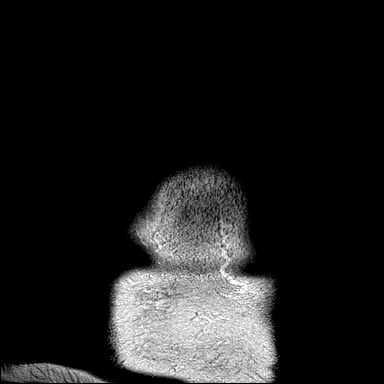
[im 29/29]
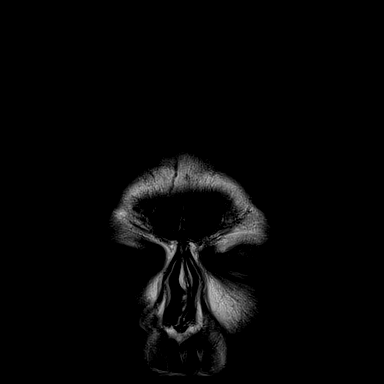

[48 of 48 positions shown; findings below may reference images not displayed]

FINDINGS: MRI HEAD FINDINGS

Brain: Acute infarct in the right insular cortex and right parietal
operculum. There is FLAIR hyperintensity in the right MCA branches
in this area due to slow flow or thrombosis. There is susceptibility
in the right sylvian fissure compatible with thrombus in the right
MCA branch.

Mild atrophy. Mild chronic microvascular ischemic change in the
white matter. Small chronic infarct in the right cerebellum.
Negative for mass lesion.

Vascular: Normal arterial flow voids at the base of brain.
Susceptibility in the right sylvian fissure with FLAIR
hyperintensity in the right MCA vessels compatible with slow flow or
occlusion

Skull and upper cervical spine: Negative

Sinuses/Orbits: Mild mucosal edema paranasal sinuses. Bilateral
cataract extraction

Other: None

MRA HEAD FINDINGS

Internal carotid artery widely patent bilaterally without stenosis.
Anterior cerebral arteries widely patent with mild irregularity.
Left middle cerebral artery widely patent with mild irregularity.

Right M1 segment patent. Occlusion of the superior division of the
right M3 vessel. Inferior division right M2 widely patent.

Posterior circulation normal without significant stenosis. Fetal
origin right posterior cerebral artery.
IMPRESSION: 1. Acute infarct right insula and parietal operculum.
2. Mild chronic microvascular ischemic change in the white matter
3. Occlusion of the right M3 branch superior division

## 2020-05-10 IMAGING — CT CT HEAD W/O CM
3 series · 15 of 47 positions shown, 18 images · non-contrast
Comparison: None.

CLINICAL DATA: Discoordination and slurred speech with fall

EXAM:
CT HEAD WITHOUT CONTRAST
TECHNIQUE: Contiguous axial images were obtained from the base of the skull
through the vertex without intravenous contrast.

[Series 2: head wo · axial · 0.46mm/px · z∈[-161,-36]mm · 9 of 30 slices shown, 12 images]
[im 3/30  brain]
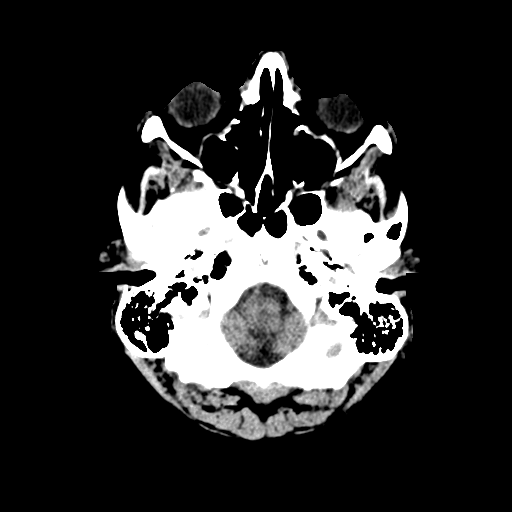
[im 3/30  bone]
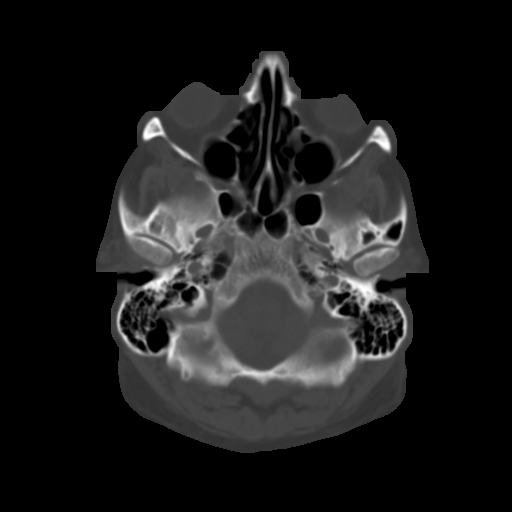
[im 6/30  brain]
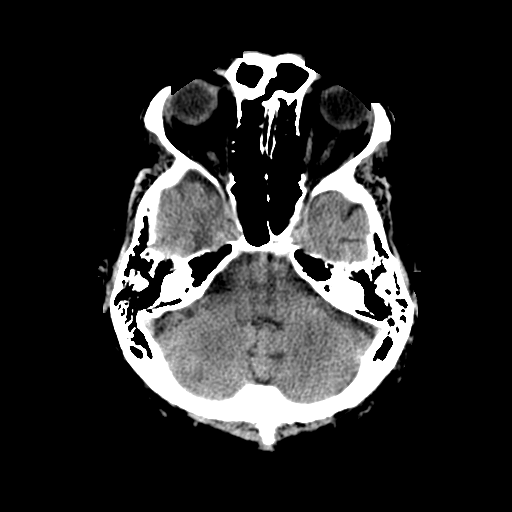
[im 9/30  brain]
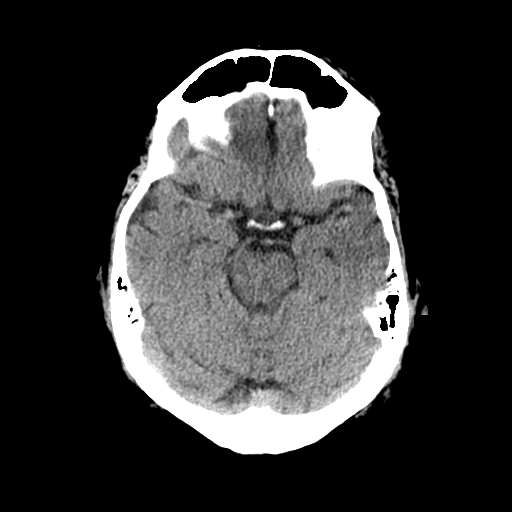
[im 12/30  brain]
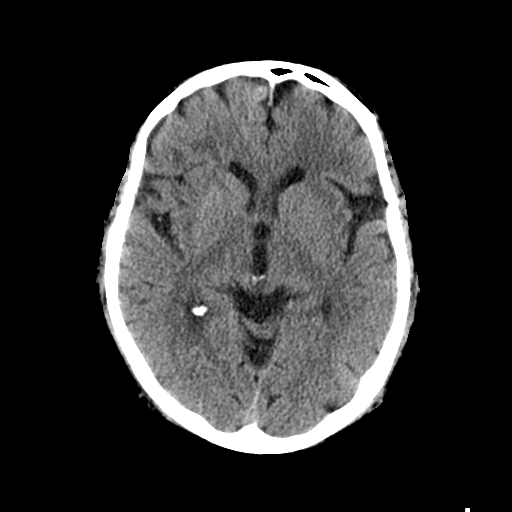
[im 16/30  brain]
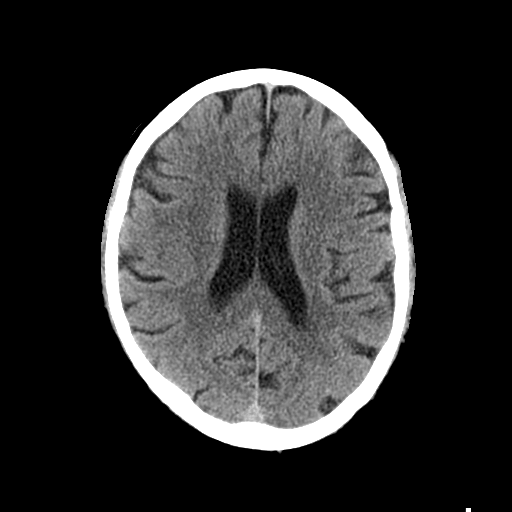
[im 16/30  bone]
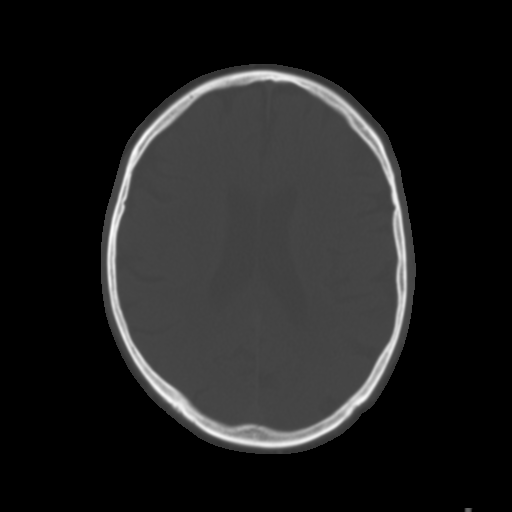
[im 19/30  brain]
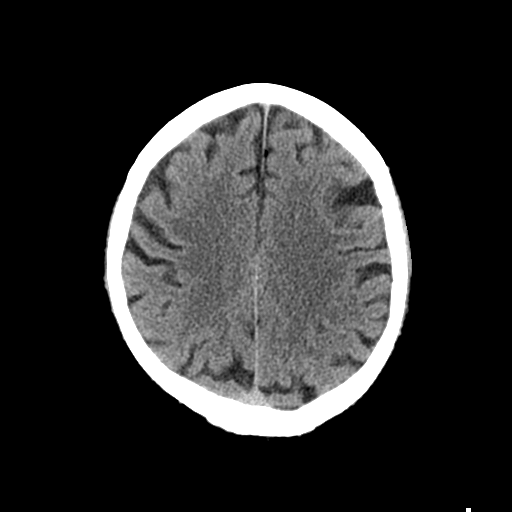
[im 22/30  brain]
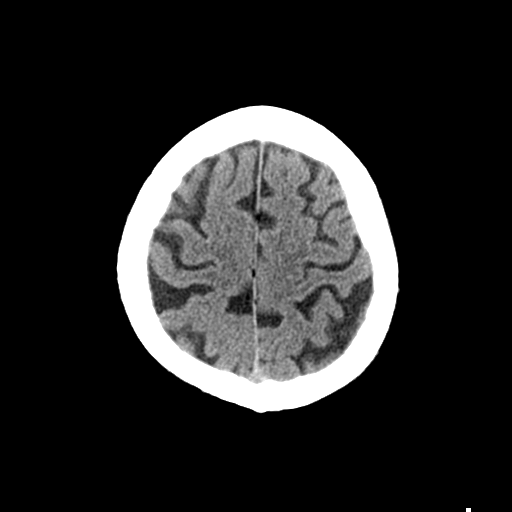
[im 25/30  brain]
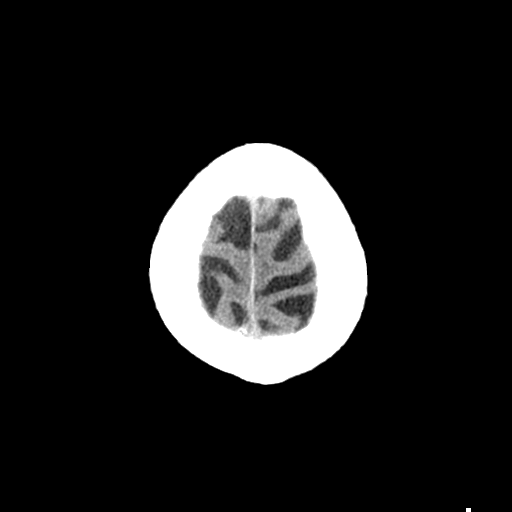
[im 28/30  brain]
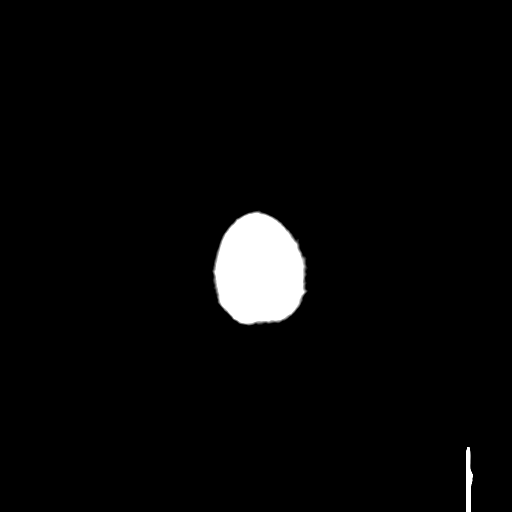
[im 28/30  bone]
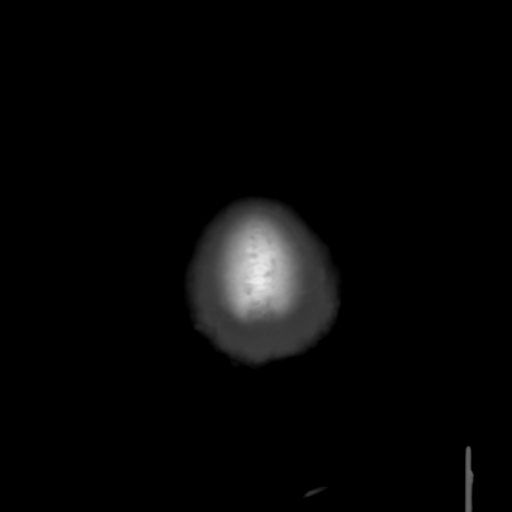

[Series 4: coronal soft tissue · coronal · 0.29mm/px · 3 of 68 slices shown]
[im 23/68  brain]
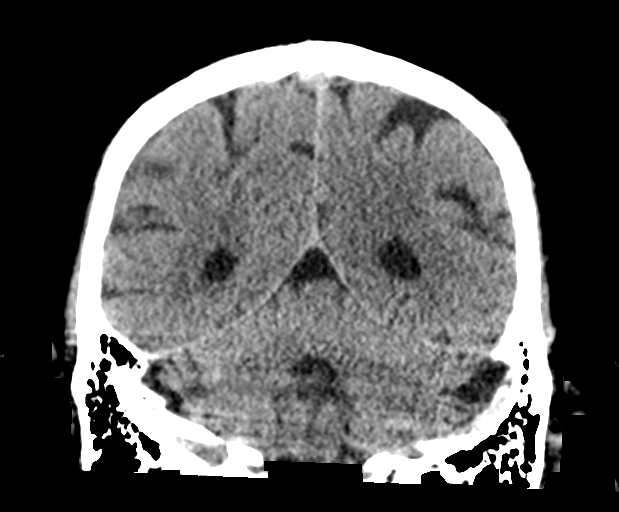
[im 30/68  brain]
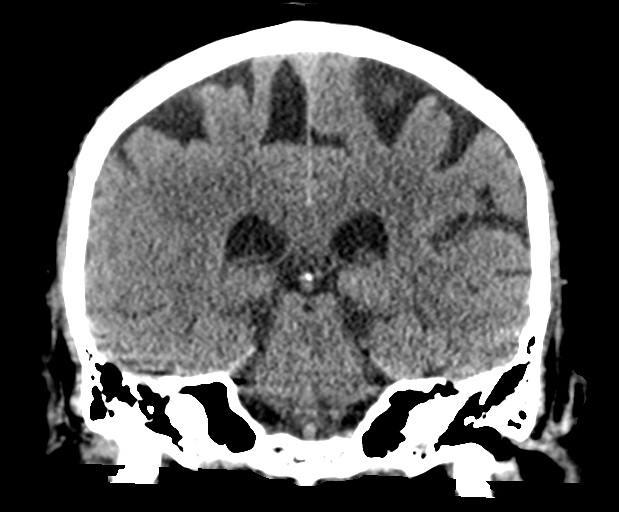
[im 38/68  brain]
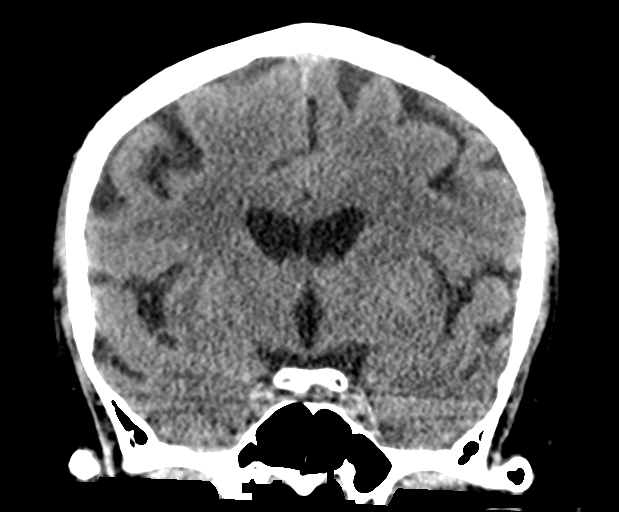

[Series 5: sagittal soft tissue · sagittal · 0.29mm/px · 3 of 61 slices shown]
[im 21/61  brain]
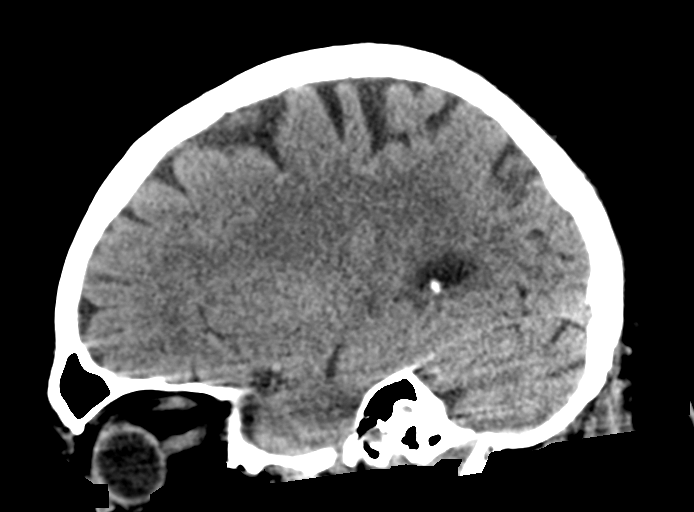
[im 31/61  brain]
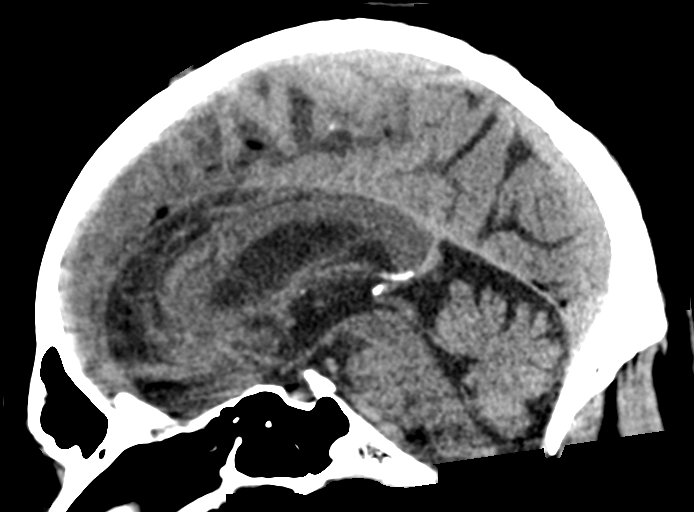
[im 41/61  brain]
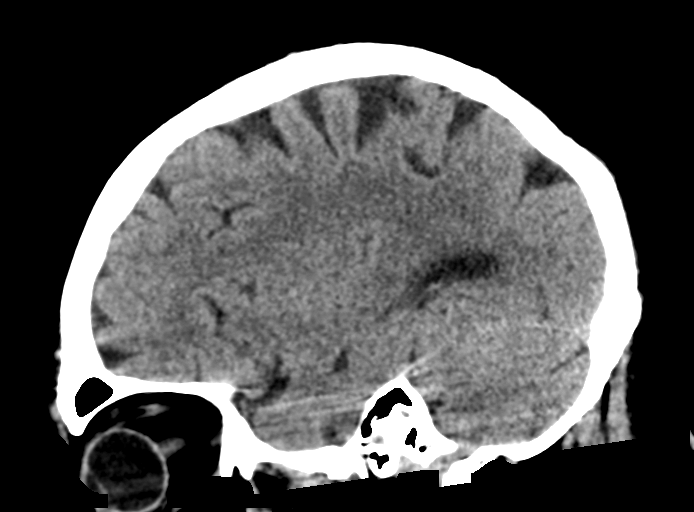

[15 of 47 positions shown; findings below may reference images not displayed]

FINDINGS: Brain: There is mild diffuse atrophy. There is no appreciable
intracranial mass, hemorrhage, extra-axial fluid collection, or
midline shift. The brain parenchyma appears unremarkable. No acute
infarct is demonstrable on this study.

Vascular: There is no hyperdense vessel. There is slight
calcification in the carotid siphon regions bilaterally.

Skull: Bony calvarium appears intact.

Sinuses/Orbits: There is mucosal thickening in the medial superior
left maxillary antrum. There is slight mucosal thickening in several
ethmoid air cells. Orbits appear symmetric bilaterally.

Other: Visualized mastoid air cells clear.
IMPRESSION: Mild atrophy. Normal appearing brain parenchyma. No acute infarct
demonstrated by CT. No mass or hemorrhage.

Slight arterial vascular calcification noted. Areas of paranasal
sinus mucosal thickening noted.

## 2020-05-10 SURGERY — IR WITH ANESTHESIA
Anesthesia: General

## 2020-05-10 MED ORDER — CALCITRIOL 0.25 MCG PO CAPS: 0.2500 ug | ORAL_CAPSULE | Freq: Every day | ORAL | Status: DC

## 2020-05-10 MED ORDER — SODIUM CHLORIDE 0.9 % IV SOLN: INTRAVENOUS | Status: DC

## 2020-05-10 MED ORDER — TIROFIBAN HCL IN NACL 5-0.9 MG/100ML-% IV SOLN
INTRAVENOUS | Status: AC
  Filled 2020-05-10: qty 100

## 2020-05-10 MED ORDER — ASPIRIN 300 MG RE SUPP: 300.0000 mg | Freq: Once | RECTAL | Status: AC

## 2020-05-10 MED ORDER — ACETAMINOPHEN 325 MG PO TABS: 650.0000 mg | ORAL_TABLET | ORAL | Status: DC | PRN

## 2020-05-10 MED ORDER — CEFAZOLIN SODIUM-DEXTROSE 2-3 GM-%(50ML) IV SOLR
INTRAVENOUS | Status: DC | PRN
  Administered 2020-05-10: 2 g via INTRAVENOUS

## 2020-05-10 MED ORDER — PANTOPRAZOLE SODIUM 40 MG PO TBEC: 40.0000 mg | DELAYED_RELEASE_TABLET | Freq: Every day | ORAL | Status: DC

## 2020-05-10 MED ORDER — INSULIN ASPART 100 UNIT/ML ~~LOC~~ SOLN: 0.0000 [IU] | Freq: Every day | SUBCUTANEOUS | Status: DC

## 2020-05-10 MED ORDER — ROCURONIUM BROMIDE 10 MG/ML (PF) SYRINGE
PREFILLED_SYRINGE | INTRAVENOUS | Status: DC | PRN
  Administered 2020-05-10: 50 mg via INTRAVENOUS
  Administered 2020-05-10: 30 mg via INTRAVENOUS
  Administered 2020-05-10: 50 mg via INTRAVENOUS
  Administered 2020-05-10: 30 mg via INTRAVENOUS

## 2020-05-10 MED ORDER — ORAL CARE MOUTH RINSE
15.0000 mL | OROMUCOSAL | Status: DC
  Administered 2020-05-10 – 2020-05-13 (×21): 15 mL via OROMUCOSAL

## 2020-05-10 MED ORDER — CLEVIDIPINE BUTYRATE 0.5 MG/ML IV EMUL
0.0000 mg/h | INTRAVENOUS | Status: AC
  Administered 2020-05-10: 4 mg/h via INTRAVENOUS
  Administered 2020-05-11: 2 mg/h via INTRAVENOUS
  Filled 2020-05-10 (×3): qty 50

## 2020-05-10 MED ORDER — STROKE: EARLY STAGES OF RECOVERY BOOK
Freq: Once | Status: DC
  Filled 2020-05-10: qty 1

## 2020-05-10 MED ORDER — ENOXAPARIN SODIUM 30 MG/0.3ML ~~LOC~~ SOLN: 30.0000 mg | SUBCUTANEOUS | Status: DC

## 2020-05-10 MED ORDER — IOHEXOL 300 MG/ML  SOLN
150.0000 mL | Freq: Once | INTRAMUSCULAR | Status: AC | PRN
  Administered 2020-05-10: 100 mL via INTRA_ARTERIAL

## 2020-05-10 MED ORDER — ENOXAPARIN SODIUM 40 MG/0.4ML ~~LOC~~ SOLN: 40.0000 mg | SUBCUTANEOUS | Status: DC

## 2020-05-10 MED ORDER — SODIUM CHLORIDE 0.9 % IV SOLN: 250.0000 mL | INTRAVENOUS | Status: DC

## 2020-05-10 MED ORDER — IOHEXOL 350 MG/ML SOLN
100.0000 mL | Freq: Once | INTRAVENOUS | Status: AC | PRN
  Administered 2020-05-10: 100 mL via INTRAVENOUS

## 2020-05-10 MED ORDER — SODIUM CHLORIDE 0.9 % IV BOLUS
1000.0000 mL | Freq: Once | INTRAVENOUS | Status: AC
  Administered 2020-05-10: 14:00:00 1000 mL via INTRAVENOUS

## 2020-05-10 MED ORDER — ATORVASTATIN CALCIUM 20 MG PO TABS: 40.0000 mg | ORAL_TABLET | Freq: Every day | ORAL | Status: DC

## 2020-05-10 MED ORDER — SODIUM CHLORIDE 3 % IN NEBU
4.0000 mL | INHALATION_SOLUTION | Freq: Two times a day (BID) | RESPIRATORY_TRACT | Status: AC
  Administered 2020-05-10 – 2020-05-13 (×6): 4 mL via RESPIRATORY_TRACT
  Filled 2020-05-10 (×6): qty 4

## 2020-05-10 MED ORDER — INSULIN ASPART 100 UNIT/ML ~~LOC~~ SOLN
0.0000 [IU] | SUBCUTANEOUS | Status: DC
  Administered 2020-05-10: 8 [IU] via SUBCUTANEOUS
  Administered 2020-05-10: 15 [IU] via SUBCUTANEOUS
  Administered 2020-05-11 (×2): 11 [IU] via SUBCUTANEOUS
  Administered 2020-05-11: 8 [IU] via SUBCUTANEOUS
  Administered 2020-05-11: 5 [IU] via SUBCUTANEOUS
  Administered 2020-05-11: 15 [IU] via SUBCUTANEOUS
  Administered 2020-05-12: 5 [IU] via SUBCUTANEOUS
  Administered 2020-05-12: 3 [IU] via SUBCUTANEOUS
  Administered 2020-05-12: 5 [IU] via SUBCUTANEOUS

## 2020-05-10 MED ORDER — ACETAMINOPHEN 160 MG/5ML PO SOLN
650.0000 mg | ORAL | Status: DC | PRN
  Administered 2020-05-12: 650 mg
  Filled 2020-05-10: qty 20.3

## 2020-05-10 MED ORDER — TICAGRELOR 90 MG PO TABS
ORAL_TABLET | ORAL | Status: AC
  Filled 2020-05-10: qty 2

## 2020-05-10 MED ORDER — SODIUM CHLORIDE 0.9 % IV SOLN: INTRAVENOUS | Status: DC | PRN

## 2020-05-10 MED ORDER — EPTIFIBATIDE 20 MG/10ML IV SOLN
INTRAVENOUS | Status: AC
  Filled 2020-05-10: qty 10

## 2020-05-10 MED ORDER — ACETAMINOPHEN 650 MG RE SUPP: 650.0000 mg | RECTAL | Status: DC | PRN

## 2020-05-10 MED ORDER — ACETAMINOPHEN 160 MG/5ML PO SOLN
650.0000 mg | ORAL | Status: DC | PRN
  Filled 2020-05-10: qty 20.3

## 2020-05-10 MED ORDER — HYDRALAZINE HCL 20 MG/ML IJ SOLN: 5.0000 mg | INTRAMUSCULAR | Status: DC | PRN

## 2020-05-10 MED ORDER — PROPOFOL 10 MG/ML IV BOLUS
INTRAVENOUS | Status: DC | PRN
  Administered 2020-05-10: 100 mg via INTRAVENOUS
  Administered 2020-05-10 (×2): 10 mg via INTRAVENOUS

## 2020-05-10 MED ORDER — ONDANSETRON HCL 4 MG/2ML IJ SOLN
4.0000 mg | Freq: Three times a day (TID) | INTRAMUSCULAR | Status: DC | PRN
  Filled 2020-05-10: qty 2

## 2020-05-10 MED ORDER — ASPIRIN 325 MG PO TABS
325.0000 mg | ORAL_TABLET | Freq: Every day | ORAL | Status: DC
  Filled 2020-05-10: qty 1

## 2020-05-10 MED ORDER — FENTANYL 2500MCG IN NS 250ML (10MCG/ML) PREMIX INFUSION
0.0000 ug/h | INTRAVENOUS | Status: DC
Start: 2020-05-10 — End: 2020-05-11
  Administered 2020-05-10: 25 ug/h via INTRAVENOUS
  Filled 2020-05-10: qty 250

## 2020-05-10 MED ORDER — VITAMIN D3 25 MCG (1000 UNIT) PO TABS
2000.0000 [IU] | ORAL_TABLET | Freq: Every day | ORAL | Status: DC
  Filled 2020-05-10: qty 2

## 2020-05-10 MED ORDER — ALPHA-LIPOIC ACID 600 MG PO TABS: 600.0000 mg | ORAL_TABLET | Freq: Every day | ORAL | Status: DC

## 2020-05-10 MED ORDER — CHLORHEXIDINE GLUCONATE 0.12% ORAL RINSE (MEDLINE KIT)
15.0000 mL | Freq: Two times a day (BID) | OROMUCOSAL | Status: DC
  Administered 2020-05-10 – 2020-05-14 (×8): 15 mL via OROMUCOSAL

## 2020-05-10 MED ORDER — INSULIN ASPART PROT & ASPART (70-30 MIX) 100 UNIT/ML ~~LOC~~ SUSP
45.0000 [IU] | Freq: Every day | SUBCUTANEOUS | Status: DC
  Filled 2020-05-10: qty 10

## 2020-05-10 MED ORDER — ASPIRIN 300 MG RE SUPP
300.0000 mg | Freq: Every day | RECTAL | Status: DC
  Filled 2020-05-10: qty 1

## 2020-05-10 MED ORDER — CANGRELOR TETRASODIUM 50 MG IV SOLR
INTRAVENOUS | Status: AC
  Filled 2020-05-10: qty 50

## 2020-05-10 MED ORDER — IOHEXOL 240 MG/ML SOLN
INTRAMUSCULAR | Status: AC
  Filled 2020-05-10: qty 200

## 2020-05-10 MED ORDER — SENNOSIDES-DOCUSATE SODIUM 8.6-50 MG PO TABS: 1.0000 | ORAL_TABLET | Freq: Every evening | ORAL | Status: DC | PRN

## 2020-05-10 MED ORDER — INSULIN ASPART 100 UNIT/ML ~~LOC~~ SOLN: 0.0000 [IU] | Freq: Three times a day (TID) | SUBCUTANEOUS | Status: DC

## 2020-05-10 MED ORDER — STROKE: EARLY STAGES OF RECOVERY BOOK: Freq: Once | Status: AC

## 2020-05-10 MED ORDER — PHENYLEPHRINE HCL-NACL 10-0.9 MG/250ML-% IV SOLN: 25.0000 ug/min | INTRAVENOUS | Status: DC

## 2020-05-10 MED ORDER — PREGABALIN 75 MG PO CAPS: 75.0000 mg | ORAL_CAPSULE | Freq: Two times a day (BID) | ORAL | Status: DC

## 2020-05-10 MED ORDER — PHENYLEPHRINE HCL-NACL 10-0.9 MG/250ML-% IV SOLN
INTRAVENOUS | Status: DC | PRN
  Administered 2020-05-10: 100 ug/min via INTRAVENOUS

## 2020-05-10 MED ORDER — SUCCINYLCHOLINE CHLORIDE 20 MG/ML IJ SOLN
INTRAMUSCULAR | Status: DC | PRN
  Administered 2020-05-10: 100 mg via INTRAVENOUS

## 2020-05-10 MED ORDER — FENTANYL CITRATE (PF) 100 MCG/2ML IJ SOLN: 25.0000 ug | INTRAMUSCULAR | Status: DC | PRN

## 2020-05-10 MED ORDER — NITROGLYCERIN 1 MG/10 ML FOR IR/CATH LAB
INTRA_ARTERIAL | Status: AC | PRN
  Administered 2020-05-10: 25 ug via INTRA_ARTERIAL

## 2020-05-10 MED ORDER — ACETAMINOPHEN 160 MG/5ML PO SOLN: 650.0000 mg | ORAL | Status: DC | PRN

## 2020-05-10 MED ORDER — LIDOCAINE 2% (20 MG/ML) 5 ML SYRINGE
INTRAMUSCULAR | Status: DC | PRN
  Administered 2020-05-10: 60 mg via INTRAVENOUS

## 2020-05-10 MED ORDER — PROPOFOL 500 MG/50ML IV EMUL
INTRAVENOUS | Status: DC | PRN
  Administered 2020-05-10: 50 ug/kg/min via INTRAVENOUS

## 2020-05-10 MED ORDER — HYDRALAZINE HCL 20 MG/ML IJ SOLN: 10.0000 mg | INTRAMUSCULAR | Status: DC | PRN

## 2020-05-10 MED ORDER — DOCUSATE SODIUM 50 MG/5ML PO LIQD
100.0000 mg | Freq: Two times a day (BID) | ORAL | Status: DC
  Administered 2020-05-11 – 2020-05-12 (×3): 100 mg
  Filled 2020-05-10 (×3): qty 10

## 2020-05-10 MED ORDER — FENTANYL CITRATE (PF) 250 MCG/5ML IJ SOLN
INTRAMUSCULAR | Status: DC | PRN
  Administered 2020-05-10: 100 ug via INTRAVENOUS

## 2020-05-10 MED ORDER — CLOPIDOGREL BISULFATE 300 MG PO TABS
ORAL_TABLET | ORAL | Status: AC
  Filled 2020-05-10: qty 1

## 2020-05-10 MED ORDER — VITAMIN B-12 1000 MCG PO TABS: 1000.0000 ug | ORAL_TABLET | Freq: Every day | ORAL | Status: DC

## 2020-05-10 MED ORDER — POLYETHYLENE GLYCOL 3350 17 G PO PACK
17.0000 g | PACK | Freq: Every day | ORAL | Status: DC
  Administered 2020-05-11 – 2020-05-12 (×2): 17 g
  Filled 2020-05-10 (×2): qty 1

## 2020-05-10 MED ORDER — ASPIRIN 81 MG PO CHEW: 81.0000 mg | CHEWABLE_TABLET | Freq: Every day | ORAL | Status: DC

## 2020-05-10 MED ORDER — FENTANYL CITRATE (PF) 100 MCG/2ML IJ SOLN
INTRAMUSCULAR | Status: AC
  Filled 2020-05-10: qty 2

## 2020-05-10 MED ORDER — VERAPAMIL HCL 2.5 MG/ML IV SOLN
INTRAVENOUS | Status: AC
  Filled 2020-05-10: qty 2

## 2020-05-10 MED ORDER — ACETAMINOPHEN 325 MG PO TABS
650.0000 mg | ORAL_TABLET | ORAL | Status: DC | PRN
  Administered 2020-05-14 (×2): 650 mg via ORAL
  Filled 2020-05-10 (×2): qty 2

## 2020-05-10 MED ORDER — PANTOPRAZOLE SODIUM 40 MG IV SOLR
40.0000 mg | Freq: Every day | INTRAVENOUS | Status: DC
  Administered 2020-05-11 – 2020-05-12 (×2): 40 mg via INTRAVENOUS
  Filled 2020-05-10 (×2): qty 40

## 2020-05-10 MED ORDER — INSULIN ASPART PROT & ASPART (70-30 MIX) 100 UNIT/ML PEN: 30.0000 [IU] | PEN_INJECTOR | Freq: Two times a day (BID) | SUBCUTANEOUS | Status: DC

## 2020-05-10 MED ORDER — FENTANYL CITRATE (PF) 100 MCG/2ML IJ SOLN
25.0000 ug | INTRAMUSCULAR | Status: DC | PRN
  Administered 2020-05-10 (×3): 100 ug via INTRAVENOUS
  Filled 2020-05-10 (×3): qty 2

## 2020-05-10 MED ORDER — NITROGLYCERIN 1 MG/10 ML FOR IR/CATH LAB
INTRA_ARTERIAL | Status: AC
  Filled 2020-05-10: qty 10

## 2020-05-10 MED ORDER — ONDANSETRON HCL 4 MG/2ML IJ SOLN
4.0000 mg | Freq: Once | INTRAMUSCULAR | Status: AC
  Administered 2020-05-10: 4 mg via INTRAVENOUS

## 2020-05-10 MED ORDER — INSULIN ASPART PROT & ASPART (70-30 MIX) 100 UNIT/ML ~~LOC~~ SUSP
30.0000 [IU] | Freq: Every day | SUBCUTANEOUS | Status: DC
  Filled 2020-05-10: qty 10

## 2020-05-10 MED ORDER — PROPOFOL 1000 MG/100ML IV EMUL
0.0000 ug/kg/min | INTRAVENOUS | Status: DC
  Administered 2020-05-10: 10 ug/kg/min via INTRAVENOUS

## 2020-05-10 MED ORDER — CEFAZOLIN SODIUM-DEXTROSE 2-4 GM/100ML-% IV SOLN
INTRAVENOUS | Status: AC
  Filled 2020-05-10: qty 100

## 2020-05-10 MED ORDER — ASPIRIN 300 MG RE SUPP
300.0000 mg | Freq: Every day | RECTAL | Status: DC
  Administered 2020-05-10: 15:00:00 300 mg via RECTAL
  Filled 2020-05-10: qty 1

## 2020-05-10 MED ORDER — COLESTIPOL HCL 1 G PO TABS
1.0000 g | ORAL_TABLET | Freq: Two times a day (BID) | ORAL | Status: DC
  Filled 2020-05-10: qty 1

## 2020-05-10 MED ORDER — EPHEDRINE SULFATE 50 MG/ML IJ SOLN
INTRAMUSCULAR | Status: DC | PRN
  Administered 2020-05-10: 5 mg via INTRAVENOUS
  Administered 2020-05-10 (×2): 7.5 mg via INTRAVENOUS

## 2020-05-10 MED ORDER — ASPIRIN 81 MG PO CHEW
CHEWABLE_TABLET | ORAL | Status: AC
  Filled 2020-05-10: qty 1

## 2020-05-10 NOTE — Progress Notes (Signed)
PHARMACIST - PHYSICIAN COMMUNICATION  CONCERNING:  Enoxaparin (Lovenox) for DVT Prophylaxis    RECOMMENDATION: Patient was prescribed enoxaprin 40mg  q24 hours for VTE prophylaxis.   Filed Weights   05/10/20 0904  Weight: 103 kg (227 lb)    Body mass index is 33.52 kg/m.  Estimated Creatinine Clearance: 33.9 mL/min (A) (by C-G formula based on SCr of 2.26 mg/dL (H)).   Based on [place] patient is candidate for enoxaparin 40mg  SQ every 24 hours based on BMI being >30 and Scr>30  DESCRIPTION: Pharmacy has adjusted enoxaparin dose per [facility] policy.  Patient is now receiving enoxaparin 40 mg every 24 hours    Berta Minor, PharmD Clinical Pharmacist  05/10/2020 2:35 PM

## 2020-05-10 NOTE — Progress Notes (Signed)
eLink Physician-Brief Progress Note Patient Name: [name] DOB: 12/23/1945---------------------------------------------------------------------- MRN: [id number]   Date of Service  05/10/2020  HPI/Events of Note  Radiologist called and indicated that patient's ET tube tip is at the carina and patient has right upper lobe atelectasis.  eICU Interventions  RT instructed to withdraw ETT 3 cm and CXR ordered post-withdrawal.        Jakayden Cancio U Shantae Vantol 05/10/2020, 7:30 PM

## 2020-05-10 NOTE — Code Documentation (Signed)
Stroke Response Nurse Documentation Code Documentation  Thomas Sampson is a 75 y.o. male arriving to [place] ED via [facility] on 05/10/2020 with past medical hx of Diabetes CKD, HTN, HLP. Code IR  was activated by [facility]. Patient from was LKW at 2200 last night when he went to bed. This morning, he woke up and fell after he woke up with some left sided weakness, slurred speech. EMS was called and by the time patient arrived to [facility] ED, his NIHSS 0 with mild left hand weakness. Patient was admitted and continued to worsen throughout the morning. Neurology called and reactivated a Code Stroke. CTA/CTP completed at [facility] and noted to have large vessel occlusion per MD Lorrin Goodell.   Stroke team at the bedside on patient arrival. NIHSS 10, see documentation for details and code stroke times. Patient with right gaze preference , left hemianopia, right facial droop, right leg weakness, right limb ataxia, right decreased sensation, dysarthria  and Sensory  neglect on exam. Patient brought directly to IR Suite. Transferred to table.   Report given to Marylyn Ishihara, Therapist, sports.   Kathrin Greathouse  Stroke Response RN

## 2020-05-10 NOTE — Progress Notes (Addendum)
Brief Neuro Update:  Notified of a change in neuro exam via secure chat by primary team and RN(message sent to me at 1400 on 05/10/20). On my immediate bedside evaluation, patient now has some R gaze preference with L facial droop, dysarthria, ataxia. NIHSS of 7.   NIHSS components Score: Comment  1a Level of Conscious 0[x]  1[]  2[]  3[]      1b LOC Questions 0[x]  1[]  2[]       1c LOC Commands 0[x]  1[]  2[]       2 Best Gaze 0[]  1[x]  2[]       3 Visual 0[x]  1[]  2[]  3[]      4 Facial Palsy 0[]  1[]  2[x]  3[]      5a Motor Arm - left 0[x]  1[]  2[]  3[]  4[]  UN[]  Incoordinated  5b Motor Arm - Right 0[x]  1[]  2[]  3[]  4[]  UN[]    6a Motor Leg - Left 0[x]  1[]  2[]  3[]  4[]  UN[]    6b Motor Leg - Right 0[x]  1[]  2[]  3[]  4[]  UN[]    7 Limb Ataxia 0[]  1[]  2[x]  3[]  UN[]     8 Sensory 0[]  1[x]  2[]  UN[]      9 Best Language 0[x]  1[]  2[]  3[]      10 Dysarthria 0[]  1[x]  2[]  UN[]      11 Extinct. 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   Aspirin 300mg  suppository, fluid bolus given and head of bed flat.  Further imaging with CT angio with relatively large R MCA M3 superior division branch occlusion concerning for an embolus. CT Perfusion with 28ml penumbra, core of 29 and mismatch ratio of 2.1. Althou the emboli is distal and present in M3, given the relatively large caliber of the vessel, decision was made to offer thrombectomy.  Dr. Estanislado Pandy discussed details of procedure along with its risks and benefits with patient's wife, son and his brother over the phone. I was present throughout the conversation. All questions were answered to family's satisfaction. Family opted to go for thrombectomy. Patient transferred to [facility] for evaluation for thrombectomy.  This patient is critically ill and at significant risk of neurological worsening, death and care requires constant monitoring of vital signs, hemodynamics,respiratory and cardiac  monitoring, neurological assessment, discussion with family, other specialists and medical decision making of high complexity. I spent 55 minutes of neurocritical care time  in the care of  this patient. This was time spent independent of any time provided by nurse practitioner or PA.  Donnetta Simpers [facility] Pager Number [phone] 05/10/2020  3:57 PM   [place] Pager Number [phone]

## 2020-05-10 NOTE — Significant Event (Signed)
Rapid Response Event Note   Reason for Call :    Initial Focused Assessment:       Interventions:    Plan of Care:  I met Dr. Lorrin Goodell with bedside RN in CT- Per Dr. Tama Headings- pt not a candidate for TPA- pt has a clot that needs to be taken out- he spoke to the family- and the patient is to transfer by carelink to [facility] for procedure.   Event Summary:   MD Notified: Dr. Lorrin Goodell Call Time: Arrival ASTM:[id number] End Time:1500  Penne Lash, RN

## 2020-05-10 NOTE — Anesthesia Procedure Notes (Signed)
Procedure Name: Intubation Date/Time: 05/10/2020 4:13 PM Performed by: Mariea Clonts, CRNA Pre-anesthesia Checklist: Patient identified, Emergency Drugs available, Suction available and Patient being monitored Patient Re-evaluated:Patient Re-evaluated prior to induction Oxygen Delivery Method: Circle System Utilized Preoxygenation: Pre-oxygenation with 100% oxygen Induction Type: IV induction, Cricoid Pressure applied and Rapid sequence Laryngoscope Size: Miller and 2 Grade View: Grade I Tube type: Oral Tube size: 7.5 mm Number of attempts: 1 Airway Equipment and Method: Stylet and Oral airway Placement Confirmation: ETT inserted through vocal cords under direct vision,  positive ETCO2 and breath sounds checked- equal and bilateral Tube secured with: Tape Dental Injury: Teeth and Oropharynx as per pre-operative assessment

## 2020-05-10 NOTE — Progress Notes (Signed)
Report called to [facility] at [facility], EMS here to transport to [place]

## 2020-05-10 NOTE — ED Notes (Signed)
Pt presents to ED via POV with wife from home with c/o of an "episode of him not being right". Wife states pt woke up this morning and heard a "thud" and states she found the pt on the side of the bed, pt denies hitting his head but states hitting his R shoulder but denies pain in this area t this time. Wife states pt was reaching for things and not being able to grasp them and also had some slurred speech. Wife denies facial droop during this episode, wife states this all started at 10 when pt woke up. Pt went to bed normal last night. Pt denies taking a blood thinner or having any injury or trauma to his head recently. Pt and wife are currently stating all of these symptoms have since resolved. Pt has equal grips and is speaking in full sentences, no slurred speech noted. Pt is A&Ox4. Pt has equal grips and BLE strengths. NAD noted.

## 2020-05-10 NOTE — Progress Notes (Signed)
CODE STROKE- PHARMACY COMMUNICATION   Time CODE STROKE called/page received:1434  Time response to CODE STROKE was made (in person): 1438  Time Stroke Kit retrieved from [place] (only if needed): N/a; non-tPA candidate. + pulled ASA 364m PR + pulled Zofran IV prn nausea at CT  Name of Provider/Nurse contacted: OMinette BrineRN stroke coordinator  Past Medical History:  Diagnosis Date  . Chronic kidney disease    kidney stones  . Diabetes mellitus without complication ([place]   . Hyperlipidemia   . Hypertension   . Tinea pedis    Prior to Admission medications   Medication Sig Start Date End Date Taking? Authorizing Provider  Alpha-Lipoic Acid 600 MG TABS Take by mouth.   Yes [provider]  amLODipine (NORVASC) 2.5 MG tablet Take 2.5 mg by mouth daily.   Yes [provider]  aspirin EC 81 MG tablet Take by mouth.   Yes [provider]  calcitRIOL (ROCALTROL) 0.25 MCG capsule Take 0.25 mcg by mouth daily. 04/26/20  Yes [provider]  Cholecalciferol 2000 units CAPS Take 2,000 Units by mouth daily.   Yes [provider]  colestipol (COLESTID) 1 g tablet Take 1 g by mouth 2 (two) times daily. 04/08/20  Yes [provider]  cyanocobalamin 1000 MCG tablet Take 1,000 mcg by mouth daily.   Yes [provider]  glipiZIDE (GLUCOTROL XL) 10 MG 24 hr tablet Take 10 mg by mouth at bedtime.   Yes [provider]  hydrochlorothiazide (HYDRODIURIL) 12.5 MG tablet Take 12.5 mg by mouth daily.   Yes [provider]  lisinopril (PRINIVIL,ZESTRIL) 40 MG tablet Take 40 mg by mouth daily. Take 20 mg twice a day   Yes [provider]  metoprolol succinate (TOPROL-XL) 100 MG 24 hr tablet Take 100 mg by mouth daily. Take with or immediately following a meal.   Yes [provider]  pantoprazole (PROTONIX) 40 MG tablet Take 40 mg by mouth daily.   Yes [provider]  pregabalin (LYRICA) 75 MG capsule Take  75 mg by mouth 2 (two) times daily. 05/05/20  Yes [provider]  cholestyramine (Lucrezia Starch 4 GM/DOSE powder Take by mouth 2 (two) times daily with a meal. Patient not taking: No sig reported    [provider]  cyanocobalamin (,VITAMIN B-12,) 1000 MCG/ML injection Inject 128mIM once very 4 weeks then once a month for 4 months. Patient not taking: No sig reported 09/27/18   [provider]  gabapentin (NEURONTIN) 300 MG capsule Take by mouth 3 (three) times daily.  08/03/17 10/15/18  [provider]  glucose blood (ONETOUCH ULTRA) test strip Use 2 (two) times daily Dx E11.9 07/26/18   [provider]  insulin aspart (NOVOLOG) 100 UNIT/ML injection Inject 26 Units into the skin AC breakfast. take 26 units before breakfast and 22 units before supper Patient not taking: No sig reported    [provider]  insulin aspart protamine - aspart (NOVOLOG 70/30 MIX) (70-30) 100 UNIT/ML FlexPen 42-52 Units. 42 units qam and 52 qhs 05/01/17   [provider]  Insulin Pen Needle (BD PEN NEEDLE NANO U/F) 32G X 4 MM MISC USE WITH INJECTIONS 2 TIMES DAILY 07/26/18   [provider]  metFORMIN (GLUCOPHAGE-XR) 500 MG 24 hr tablet Take 1,000 mg by mouth 2 (two) times daily. Patient not taking: No sig reported    [provider]    BrLorna DibblePharmD Clinical Pharmacist  05/10/2020  3:04 PM

## 2020-05-10 NOTE — Consult Note (Signed)
NEUROLOGY CONSULTATION NOTE   Date of service: May 10, 2020 Patient Name: Thomas Sampson MRN:  [id number] DOB:  26-Jul-1945 Reason for consult: "Stroke" _ _ _   _ __   _ __ _ _  __ __   _ __   __ _  History of Present Illness  Thomas Sampson is a 75 y.o. male with PMH significant for DM2 with neuropathy, hx of CKD, HTN, HLD who woke up this AM and leg slipped and was wedged between the bed and the wall. Reports that his left elbow gave out when he attempted to pivot to get out of the bed. Wife noted that his face looked weird and his speech was slurring so brought him to the ED. Symptoms resolving in the ED with resolution of dysarthria and facial droop. NIHSS of 0 but on full strength testing he is mildly weak on the left.  No prior history of strokes, no family hx of strokes, takes aspirin 81mg  every other day. Did not take it this AM. Does not smoke, no EtOH use.  NIHSS: 0 MRS: 0 LKW: 2200 on 05/09/20. TPA: no, outside window. Thrombectomy: Symptoms too mild and low concern for an LVO.   ROS   Constitutional Denies weight loss, fever and chills.   HEENT Denies changes in vision and hearing.   Respiratory Denies SOB and cough.   CV Denies palpitations and CP   GI Denies abdominal pain, nausea, vomiting and diarrhea.   GU Denies dysuria and urinary frequency.   MSK Denies myalgia and joint pain.   Skin Denies rash and pruritus.   Neurological Denies headache and syncope.   Psychiatric Denies recent changes in mood. Denies anxiety and depression.    Past History   Past Medical History:  Diagnosis Date  . Chronic kidney disease    kidney stones  . Diabetes mellitus without complication ([place])   . Hyperlipidemia   . Hypertension   . Tinea pedis    Past Surgical History:  Procedure Laterality Date  . COLON SURGERY    . COLONOSCOPY WITH PROPOFOL N/A 09/18/2016   Procedure: COLONOSCOPY WITH PROPOFOL;  Surgeon: Lollie Sails, MD;  Location: [facility] ENDOSCOPY;  Service:  Endoscopy;  Laterality: N/A;  . EYE SURGERY     cataract extraction   Family History  Problem Relation Age of Onset  . Cancer Mother   . Heart attack Father   . Heart attack Sister   . Heart disease Sister   . Diabetes Brother    Social History   Socioeconomic History  . Marital status: Married    Spouse name: Not on file  . Number of children: Not on file  . Years of education: Not on file  . Highest education level: Not on file  Occupational History  . Not on file  Tobacco Use  . Smoking status: Former Research scientist (life sciences)  . Smokeless tobacco: Never Used  Vaping Use  . Vaping Use: Never used  Substance and Sexual Activity  . Alcohol use: No  . Drug use: No  . Sexual activity: Not on file  Other Topics Concern  . Not on file  Social History Narrative  . Not on file   Social Determinants of Health   Financial Resource Strain: Not on file  Food Insecurity: Not on file  Transportation Needs: Not on file  Physical Activity: Not on file  Stress: Not on file  Social Connections: Not on file   Allergies  Allergen Reactions  .  Demerol [Meperidine]     Medications  (Not in a hospital admission)    Vitals   Vitals:   05/10/20 0903 05/10/20 0904 05/10/20 1000 05/10/20 1100  BP: (!) 165/83  (!) 141/100 (!) 150/90  Pulse: 84  81 (!) 110  Resp: 20  17 (!) 28  Temp: 98.2 F (36.8 C)     TempSrc: Oral     SpO2: 93%  96% 98%  Weight:  103 kg    Height:  5\' 9"  (1.753 m)       Body mass index is 33.52 kg/m.  Physical Exam   General: Laying comfortably in bed; in no acute distress.  HENT: Normal oropharynx and mucosa. Normal external appearance of ears and nose.  Neck: Supple, no pain or tenderness  CV: No JVD. No peripheral edema.  Pulmonary: Symmetric Chest rise. Normal respiratory effort.  Abdomen: Soft to touch, non-tender.  Ext: No cyanosis, edema, or deformity  Skin: No rash. Normal palpation of skin.   Musculoskeletal: Normal digits and nails by inspection. No  clubbing.   Neurologic Examination  Mental status/Cognition: Alert, oriented to self, place, month and year, good attention.  Speech/language: Fluent, comprehension intact, object naming intact, repetition intact.  Cranial nerves:   CN II Pupils equal and reactive to light, no VF deficits    CN III,IV,VI EOM intact, no gaze preference or deviation, no nystagmus    CN V normal sensation in V1, V2, and V3 segments bilaterally    CN VII no asymmetry, no nasolabial fold flattening.   CN VIII normal hearing to speech   CN IX & X normal palatal elevation, no uvular deviation    CN XI 5/5 head turn and 5/5 shoulder shrug bilaterally    CN XII midline tongue protrusion    Motor:  Muscle bulk: normal, tone normal, pronator drift yes in LUE tremor none Mvmt Root Nerve  Muscle Right Left Comments  SA C5/6 Ax Deltoid 5 4+   EF C5/6 Mc Biceps 5 5   EE C6/7/8 Rad Triceps 5 5   WF C6/7 Med FCR 5 5   WE C7/8 PIN ECU 5 5   F Ab C8/T1 U ADM/FDI 5 5   HF L1/2/3 Fem Illopsoas 5 4+   KE L2/3/4 Fem Quad 5 5   DF L4/5 D Peron Tib Ant 5 5   PF S1/2 Tibial Grc/Sol 5 5    Reflexes:  Right Left Comments  Pectoralis      Biceps (C5/6) 2 2   Brachioradialis (C5/6) 2 2    Triceps (C6/7) 2 2    Patellar (L3/4) 1 1    Achilles (S1)      Hoffman      Plantar     Jaw jerk    Sensation:  Light touch intact   Pin prick    Temperature    Vibration   Proprioception    Coordination/Complex Motor:  - Finger to Nose Tremulous with ?mild LUE ataxia. - Heel to shin intact BL - Rapid alternating movement are slowed in LUE - Gait: Deferred.  Labs   CBC:  Recent Labs  Lab 05/10/20 0940  WBC 9.0  NEUTROABS 6.5  HGB 14.6  HCT 42.6  MCV 92.2  PLT 951    Basic Metabolic Panel:  Lab Results  Component Value Date   NA 139 05/10/2020   K 3.7 05/10/2020   CO2 26 05/10/2020   GLUCOSE 207 (H) 05/10/2020   BUN 31 (H) 05/10/2020  CREATININE 2.26 (H) 05/10/2020   CALCIUM 9.7 05/10/2020   GFRNONAA  30 (L) 05/10/2020   GFRAA >60 02/08/2013   Lipid Panel: No results found for: LDLCALC HgbA1c: No results found for: HGBA1C Urine Drug Screen: No results found for: LABOPIA, COCAINSCRNUR, LABBENZ, AMPHETMU, THCU, LABBARB  Alcohol Level No results found for: [place]  CT Head without contrast: CTH was negative for a large hypodensity concerning for a large territory infarct or hyperdensity concerning for an ICH.  MRA head without contrast and US Carotid duplex: Pending.  MRI Brain  Pending  Impression   Thomas Sampson is a 75 y.o. male with PMH significant for DM2 with neuropathy, hx of CKD, HTN, HLD who presents with an episode of L sided weakness, facial droop and dysarthria with resolved facial droop and dysarthria, very mild L arm and leg weakness.  Symptoms are concerning for a minor ischemic stroke vs TIA.  Recommendations  Plan:  - Frequent Neuro checks per stroke unit protocol - Recommend brain imaging with MRI Brain without contrast - Recommend Vascular imaging with MRA Angio Head without contrast and US Carotid doppler - Recommend obtaining TTE - Recommend obtaining Lipid panel with LDL - Please start statin if LDL > 70 - Recommend HbA1c - Antithrombotic - aspirin 81mg  daily. - Recommend DVT ppx - SBP goal - permissive hypertension first 24 h < 220/110. Held home meds.  - Recommend Telemetry monitoring for arrythmia - Recommend bedside swallow screen prior to PO intake. - Stroke education booklet - Recommend PT/OT/SLP consult  ______________________________________________________________________   Thank you for the opportunity to take part in the care of this patient. If you have any further questions, please contact the neurology consultation attending.  Signed,  [place] Pager Number [phone] _ _ _   _ __   _ __ _ _  __ __   _ __   __ _

## 2020-05-10 NOTE — Anesthesia Preprocedure Evaluation (Addendum)
Anesthesia Evaluation  Patient identified by MRN, date of birth, ID band Patient awake    Reviewed: Allergy & Precautions, H&P , NPO status , Patient's Chart, lab work & pertinent test results  Airway Mallampati: II  TM Distance: >3 FB Neck ROM: Full    Dental no notable dental hx. (+) Teeth Intact, Dental Advisory Given   Pulmonary neg pulmonary ROS, former smoker,    Pulmonary exam normal breath sounds clear to auscultation       Cardiovascular hypertension, Pt. on medications  Rhythm:Regular Rate:Normal     Neuro/Psych CVA, Residual Symptoms negative psych ROS   GI/Hepatic Neg liver ROS, GERD  Medicated,  Endo/Other  diabetes, Oral Hypoglycemic Agents, Insulin Dependent  Renal/GU Renal disease  negative genitourinary   Musculoskeletal negative musculoskeletal ROS (+)   Abdominal (+) + obese,   Peds  Hematology negative hematology ROS (+) HLD   Anesthesia Other Findings CODE STROKE  Reproductive/Obstetrics negative OB ROS                            Anesthesia Physical Anesthesia Plan  ASA: III and emergent  Anesthesia Plan: General   Post-op Pain Management:    Induction: Intravenous and Rapid sequence  PONV Risk Score and Plan: 2 and Ondansetron, Dexamethasone and Treatment may vary due to age or medical condition  Airway Management Planned: Oral ETT  Additional Equipment:   Intra-op Plan:   Post-operative Plan: Extubation in OR and Possible Post-op intubation/ventilation  Informed Consent: I have reviewed the patients History and Physical, chart, labs and discussed the procedure including the risks, benefits and alternatives for the proposed anesthesia with the patient or authorized representative who has indicated his/her understanding and acceptance.     Dental advisory given  Plan Discussed with: CRNA  Anesthesia Plan Comments:       Anesthesia Quick  Evaluation

## 2020-05-10 NOTE — ED Notes (Signed)
Informed RN bed assigned [id number]

## 2020-05-10 NOTE — Code Documentation (Signed)
Code stroke activated in 45, Pt met in CT with bedside RN, ICU Charge RN, and Dr. Lorrin Goodell, CTA/CTP preformed, While in CT pt became diaphoretic and nauseas, IV zofran given, Code stroke IR activated through [facility], family at bedside informed, [facility] called for transport, no [facility] truck available, pt transferred to [facility] for IR @ 1536

## 2020-05-10 NOTE — H&P (Signed)
History and Physical    Thomas Sampson LPF:[id number] DOB: 02-07-1945 DOA: 05/10/2020  Referring MD/NP/PA:   PCP: Baxter Hire, MD   Patient coming from:  The patient is coming from home.  At baseline, pt is independent for most of ADL.        Chief Complaint: Left-sided weakness, slurry speech  HPI: [name] is a 75 y.o. male with medical history significant of hypertension, hyperlipidemia, diabetes mellitus, GERD, CKD-4, kidney stone, claudication, who presents with left-sided weakness, slurred speech.  Patient states that his symptoms started at about 7:30 AM.  Initially patient has slurred speech, left-sided weakness, unsteady gait, left facial droop.  Patient was last known normal at about 11 PM yesterday.  This left-sided weakness was mainly left hand initially.  his symptoms has worsened in the emergency room, particularly the left-sided weakness and numbness in ED.  Patient denies chest pain, cough, shortness breath,, fever and chills.  Patient states that he has intermittent chronic mild diarrhea, which has not changed.  Currently no nausea vomiting, abdominal pain.  No symptoms of UTI.  ED Course: pt was found to have WBC 9.0, INR 1.0, PTT 24, pending COVID-19 PCR, renal function close to baseline, temperature normal, blood pressure 141/100, heart rate 81, RR 20, oxygen saturation 96% on room air.  CT head negative for acute intracranial abnormalities. Pt is placed on MedSurg bed for observation.  Neurologist, Dr.Khaliqdina evaluated patient and recommended to transfer pt to [facility] for urgent thrombectomy by IR, Dr. Estanislado Pandy. Dr. Lorrin Goodell has arranged further treatment in hospital by Dr. Leonie Man  MRI of brain and MRA of head showed: 1. 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 2. Mild chronic microvascular ischemic change in the white matter 3. 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 MSK: No muscle spasm, no deformity, no limitation of range of movement in spin Heme: No easy bruising.  Travel history: No recent long distant travel.  Allergy:  Allergies  Allergen Reactions  . Demerol [Meperidine]     Past Medical History:  Diagnosis Date  . Chronic kidney disease    kidney stones  . Diabetes mellitus without complication ([place])   . Hyperlipidemia   . Hypertension   . Tinea pedis     Past Surgical History:  Procedure Laterality Date  . COLON SURGERY    . COLONOSCOPY WITH PROPOFOL N/A 09/18/2016   Procedure: COLONOSCOPY WITH PROPOFOL;  Surgeon: Lollie Sails, MD;  Location[facility] ENDOSCOPY;  Service: Endoscopy;  Laterality: N/A;  . EYE SURGERY     cataract extraction    Social History:  reports that he has quit smoking. His smoking use included cigarettes. He quit after 40.00 years of use. He has never used smokeless tobacco. He reports that he does not drink alcohol and does not use drugs.  Family History:  Family History  Problem Relation Age of Onset  . Cancer Mother   . Heart attack Father   . Heart attack Sister   . Heart disease Sister   .  Diabetes Brother      Prior to Admission medications   Medication Sig Start Date End Date Taking? Authorizing Provider  Alpha-Lipoic Acid 600 MG TABS Take by mouth.    [provider]  amLODipine (NORVASC) 2.5 MG tablet Take 2.5 mg by mouth daily.    [provider]  aspirin EC 81 MG tablet Take by mouth.    [provider]   Cholecalciferol 2000 units CAPS Take 2,000 Units by mouth daily.    [provider]  cholestyramine Lucrezia Starch) 4 GM/DOSE powder Take by mouth 2 (two) times daily with a meal.    [provider]  cyanocobalamin (,VITAMIN B-12,) 1000 MCG/ML injection Inject 34ml IM once very 4 weeks then once a month for 4 months. 09/27/18   [provider]  cyanocobalamin 1000 MCG tablet Take 1,000 mcg by mouth daily.    [provider]  gabapentin (NEURONTIN) 300 MG capsule Take by mouth 3 (three) times daily.  08/03/17 10/15/18  [provider]  glipiZIDE (GLUCOTROL XL) 10 MG 24 hr tablet Take 10 mg by mouth at bedtime.    [provider]  glucose blood (ONETOUCH ULTRA) test strip Use 2 (two) times daily Dx E11.9 07/26/18   [provider]  hydrochlorothiazide (HYDRODIURIL) 12.5 MG tablet Take 12.5 mg by mouth daily.    [provider]  insulin aspart (NOVOLOG) 100 UNIT/ML injection Inject 26 Units into the skin AC breakfast. take 26 units before breakfast and 22 units before supper    [provider]  insulin aspart protamine - aspart (NOVOLOG 70/30 MIX) (70-30) 100 UNIT/ML FlexPen Inject 24 units subcutaneously before breakfast and 28 units before supper 05/01/17   [provider]  Insulin Pen Needle (BD PEN NEEDLE NANO U/F) 32G X 4 MM MISC USE WITH INJECTIONS 2 TIMES DAILY 07/26/18   [provider]  lisinopril (PRINIVIL,ZESTRIL) 40 MG tablet Take 40 mg by mouth daily. Take 20 mg twice a day    [provider]  metFORMIN (GLUCOPHAGE-XR) 500 MG 24 hr tablet Take 1,000 mg by mouth 2 (two) times daily.    [provider]  metoprolol succinate (TOPROL-XL) 100 MG 24 hr tablet Take 100 mg by mouth daily. Take with or immediately following a meal.    [provider]  pantoprazole (PROTONIX) 40 MG tablet Take 40 mg by mouth daily.    [provider]    Physical Exam: Vitals:   05/10/20 1255  05/10/20 1345 05/10/20 1500 05/10/20 1530  BP: (!) 150/77 (!) 117/59  132/69  Pulse: 90 84  82  Resp:  18  18  Temp: 98 F (36.7 C) 98 F (36.7 C)  (!) 97.5 F (36.4 C)  TempSrc: Oral     SpO2: 96% 95%  98%  Weight:   99.9 kg   Height:       General: Not in acute distress HEENT:       Eyes: PERRL, EOMI, no scleral icterus.       ENT: No discharge from the ears and nose, no pharynx injection, no tonsillar enlargement.        Neck: No JVD, no bruit, no mass felt. Heme: No neck lymph node enlargement. Cardiac: S1/S2, RRR, No murmurs, No gallops or rubs. Respiratory: No rales, wheezing, rhonchi or rubs. GI: Soft, nondistended, nontender, no rebound pain, no organomegaly, BS present. GU: No hematuria Ext: No pitting leg edema bilaterally. 1+DP/PT pulse bilaterally. Musculoskeletal: No joint deformities, No joint redness or  warmth, no limitation of ROM in spin. Skin: No rashes.  Neuro: Alert, oriented X3, cranial nerves II-XII grossly intact except for left facial droop, left-sided weakness and numbness Psych: Patient is not psychotic, no suicidal or hemocidal ideation.  Labs on Admission: I have personally reviewed following labs and imaging studies  CBC: Recent Labs  Lab 05/10/20 0940  WBC 9.0  NEUTROABS 6.5  HGB 14.6  HCT 42.6  MCV 92.2  PLT 970   Basic Metabolic Panel: Recent Labs  Lab 05/10/20 0940  NA 139  K 3.7  CL 103  CO2 26  GLUCOSE 207*  BUN 31*  CREATININE 2.26*  CALCIUM 9.7   GFR: Estimated Creatinine Clearance: 33.4 mL/min (A) (by C-G formula based on SCr of 2.26 mg/dL (H)). Liver Function Tests: Recent Labs  Lab 05/10/20 0940  AST 20  ALT 16  ALKPHOS 45  BILITOT 0.8  PROT 7.2  ALBUMIN 4.1   No results for input(s): LIPASE, AMYLASE in the last 168 hours. No results for input(s): AMMONIA in the last 168 hours. Coagulation Profile: Recent Labs  Lab 05/10/20 0940  INR 1.0   Cardiac Enzymes: No results for input(s): CKTOTAL, CKMB,  CKMBINDEX, TROPONINI in the last 168 hours. BNP (last 3 results) No results for input(s): PROBNP in the last 8760 hours. HbA1C: No results for input(s): HGBA1C in the last 72 hours. CBG: Recent Labs  Lab 05/10/20 1432  GLUCAP 291*   Lipid Profile: Recent Labs    05/10/20 0940  CHOL 181  HDL 34*  LDLCALC 99  TRIG 238*  CHOLHDL 5.3   Thyroid Function Tests: No results for input(s): TSH, T4TOTAL, FREET4, T3FREE, THYROIDAB in the last 72 hours. Anemia Panel: No results for input(s): VITAMINB12, FOLATE, FERRITIN, TIBC, IRON, RETICCTPCT in the last 72 hours. Urine analysis: No results found for: COLORURINE, APPEARANCEUR, LABSPEC, PHURINE, GLUCOSEU, HGBUR, BILIRUBINUR, KETONESUR, PROTEINUR, UROBILINOGEN, NITRITE, LEUKOCYTESUR Sepsis Labs: @LABRCNTIP (procalcitonin:4,lacticidven:4) ) Recent Results (from the past 240 hour(s))  Resp Panel by RT-PCR (Flu A&B, Covid) Nasopharyngeal Swab     Status: None   Collection Time: 05/10/20  9:35 AM   Specimen: Nasopharyngeal Swab; Nasopharyngeal(NP) swabs in vial transport medium  Result Value Ref Range Status   SARS Coronavirus 2 by RT PCR NEGATIVE NEGATIVE Final    Comment: (NOTE) SARS-CoV-2 target nucleic acids are NOT DETECTED.  The SARS-CoV-2 RNA is generally detectable in upper respiratory specimens during the acute phase of infection. The lowest concentration of SARS-CoV-2 viral copies this assay can detect is 138 copies/mL. A negative result does not preclude SARS-Cov-2 infection and should not be used as the sole basis for treatment or other patient management decisions. A negative result may occur with  improper specimen collection/handling, submission of specimen other than nasopharyngeal swab, presence of viral mutation(s) within the areas targeted by this assay, and inadequate number of viral copies(<138 copies/mL). A negative result must be combined with clinical observations, patient history, and  epidemiological information. The expected result is Negative.  Fact Sheet for Patients:  EntrepreneurPulse.com.au  Fact Sheet for Healthcare Providers:  IncredibleEmployment.be  This test is no t yet approved or cleared by the [place] FDA and  has been authorized for detection and/or diagnosis of SARS-CoV-2 by FDA under an Emergency Use Authorization (EUA). This EUA will remain  in effect (meaning this test can be used) for the duration of the COVID-19 declaration under Section 564(b)(1) of the Act, 21 U.S.C.section 360bbb-3(b)(1), unless the authorization is terminated  or revoked sooner.  Influenza A by PCR NEGATIVE NEGATIVE Final   Influenza B by PCR NEGATIVE NEGATIVE Final    Comment: (NOTE) The Xpert Xpress SARS-CoV-2/FLU/RSV plus assay is intended as an aid in the diagnosis of influenza from Nasopharyngeal swab specimens and should not be used as a sole basis for treatment. Nasal washings and aspirates are unacceptable for Xpert Xpress SARS-CoV-2/FLU/RSV testing.  Fact Sheet for Patients: EntrepreneurPulse.com.au  Fact Sheet for Healthcare Providers: IncredibleEmployment.be  This test is not yet approved or cleared by the [place] FDA and has been authorized for detection and/or diagnosis of SARS-CoV-2 by FDA under an Emergency Use Authorization (EUA). This EUA will remain in effect (meaning this test can be used) for the duration of the COVID-19 declaration under Section 564(b)(1) of the Act, 21 U.S.C. section 360bbb-3(b)(1), unless the authorization is terminated or revoked.  Performed at [facility], [address] [place], [place] [address]      Radiological Exams on Admission: CT HEAD WO CONTRAST  Result Date: 05/10/2020 CLINICAL DATA:  Discoordination and slurred speech with fall EXAM: CT HEAD WITHOUT CONTRAST TECHNIQUE: Contiguous axial images were obtained  from the base of the skull through the vertex without intravenous contrast. COMPARISON:  None. FINDINGS: Brain: There is mild diffuse atrophy. There is no appreciable intracranial mass, hemorrhage, extra-axial fluid collection, or midline shift. The brain parenchyma appears unremarkable. No acute infarct is demonstrable on this study. Vascular: There is no hyperdense vessel. There is slight calcification in the carotid siphon regions bilaterally. Skull: Bony calvarium appears intact. Sinuses/Orbits: There is mucosal thickening in the medial superior left maxillary antrum. There is slight mucosal thickening in several ethmoid air cells. Orbits appear symmetric bilaterally. Other: Visualized mastoid air cells clear. IMPRESSION: Mild atrophy. Normal appearing brain parenchyma. No acute infarct demonstrated by CT. No mass or hemorrhage. Slight arterial vascular calcification noted. Areas of paranasal sinus mucosal thickening noted. Electronically Signed   By: Lowella Grip III M.D.   On: 05/10/2020 10:07   MR ANGIO HEAD WO CONTRAST  Result Date: 05/10/2020 CLINICAL DATA:  TIA. Dysarthria and slurred speech. Mildly weak on the left. EXAM: MRI HEAD WITHOUT CONTRAST MRA HEAD WITHOUT CONTRAST TECHNIQUE: Multiplanar, multiecho pulse sequences of the brain and surrounding structures were obtained without intravenous contrast. Angiographic images of the head were obtained using MRA technique without contrast. COMPARISON:  CT head 05/10/2020 FINDINGS: MRI HEAD FINDINGS Brain: Acute infarct in the right insular cortex and right parietal operculum. There is FLAIR hyperintensity in the right MCA branches in this area due to slow flow or thrombosis. There is susceptibility in the right sylvian fissure compatible with thrombus in the right MCA branch. Mild atrophy. Mild chronic microvascular ischemic change in the white matter. 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 Negative for mass lesion. Vascular: Normal arterial  flow voids at the base of brain. Susceptibility in the right sylvian fissure with FLAIR hyperintensity in the right MCA vessels compatible with slow flow or occlusion Skull and upper cervical spine: Negative Sinuses/Orbits: Mild mucosal edema paranasal sinuses. Bilateral cataract extraction Other: None MRA HEAD FINDINGS Internal carotid artery widely patent bilaterally without stenosis. Anterior cerebral arteries widely patent with mild irregularity. Left middle cerebral artery widely patent with mild irregularity. Right M1 segment patent. Occlusion of the superior division of the right M3 vessel. Inferior division right M2 widely patent. Posterior circulation normal without significant stenosis. Fetal origin right posterior cerebral artery. IMPRESSION: 1. 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 2. Mild chronic microvascular ischemic  change in the white matter 3. 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   On: 05/10/2020 13:52   MR BRAIN WO CONTRAST  Result Date: 05/10/2020 CLINICAL DATA:  TIA. Dysarthria and slurred speech. Mildly weak on the left. EXAM: MRI HEAD WITHOUT CONTRAST MRA HEAD WITHOUT CONTRAST TECHNIQUE: Multiplanar, multiecho pulse sequences of the brain and surrounding structures were obtained without intravenous contrast. Angiographic images of the head were obtained using MRA technique without contrast. COMPARISON:  CT head 05/10/2020 FINDINGS: MRI HEAD FINDINGS Brain: Acute infarct in the right insular cortex and right parietal operculum. There is FLAIR hyperintensity in the right MCA branches in this area due to slow flow or thrombosis. There is susceptibility in the right sylvian fissure compatible with thrombus in the right MCA branch. Mild atrophy. Mild chronic microvascular ischemic change in the white matter. 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 Negative for mass lesion. Vascular: Normal arterial flow voids at  the base of brain. Susceptibility in the right sylvian fissure with FLAIR hyperintensity in the right MCA vessels compatible with slow flow or occlusion Skull and upper cervical spine: Negative Sinuses/Orbits: Mild mucosal edema paranasal sinuses. Bilateral cataract extraction Other: None MRA HEAD FINDINGS Internal carotid artery widely patent bilaterally without stenosis. Anterior cerebral arteries widely patent with mild irregularity. Left middle cerebral artery widely patent with mild irregularity. Right M1 segment patent. Occlusion of the superior division of the right M3 vessel. Inferior division right M2 widely patent. Posterior circulation normal without significant stenosis. Fetal origin right posterior cerebral artery. IMPRESSION: 1. 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 2. Mild chronic microvascular ischemic change in the white matter 3. 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   On: 05/10/2020 13:52   CT CEREBRAL PERFUSION W CONTRAST  Addendum Date: 05/10/2020   ADDENDUM REPORT: 05/10/2020 15:20 ADDENDUM: These results were called by telephone at the time of interpretation on 05/10/2020 at 3:20 pm to provider [facility] , who verbally acknowledged these results. Electronically Signed   By: Franchot Gallo M.D.   On: 05/10/2020 15:20   Result Date: 05/10/2020 CLINICAL DATA:  Stroke. EXAM: CT ANGIOGRAPHY HEAD AND NECK CT PERFUSION BRAIN TECHNIQUE: Multidetector CT imaging of the head and neck was performed using the standard protocol during bolus administration of intravenous contrast. Multiplanar CT image reconstructions and MIPs were obtained to evaluate the vascular anatomy. Carotid stenosis measurements (when applicable) are obtained utilizing NASCET criteria, using the distal internal carotid diameter as the denominator. Multiphase CT imaging of the brain was performed following IV bolus contrast injection. Subsequent  parametric perfusion maps were calculated using RAPID software. CONTRAST:  171mL OMNIPAQUE IOHEXOL 350 MG/ML SOLN COMPARISON:  CT head 05/10/2020.  MRI and MRA head 05/10/2020 FINDINGS: CTA NECK FINDINGS Aortic arch: Standard branching. Imaged portion shows no evidence of aneurysm or dissection. No significant stenosis of the major arch vessel origins. Right carotid system: Mild atherosclerotic calcification right carotid bifurcation without significant stenosis. No irregularity or thrombus in the right carotid. Left carotid system: Mild atherosclerotic disease left carotid without stenosis or irregularity. Vertebral arteries: Both vertebral arteries widely patent. Skeleton: Disc degeneration and spurring. Foraminal narrowing due to spurring bilaterally at C5-6 and C6-7. No acute skeletal abnormality. Other neck: No mass or edema identified. Upper chest: Lung apices clear bilaterally. Review of the MIP images confirms the above findings CTA HEAD FINDINGS Anterior circulation: Cavernous carotid widely  patent bilaterally. Anterior cerebral arteries widely patent. Left middle cerebral artery widely patent. Right M1 segment widely patent. There is occlusion of a large right M3 branch superior division right MCA as noted on prior MRA. This appears acute. Posterior circulation: Normal posterior circulation. No stenosis or large vessel occlusion. Venous sinuses: Normal venous enhancement. Anatomic variants: None Review of the MIP images confirms the above findings CT Brain Perfusion Findings: ASPECTS: 10 CBF (<30%) Volume: 34mL Perfusion (Tmax>6.0s) volume: 11mL Mismatch Volume: 29mL Infarction Location:Right insula and right operculum. IMPRESSION: 1. Abnormal CT perfusion. 29 mm core infarct with additional 33 mL of surrounding penumbra in the right insula and operculum corresponding to restricted diffusion on MRI. 2. Occluded right superior branch of M3 which is a relatively large vessel compatible with an embolus. 3. No  significant carotid or vertebral artery stenosis in the neck. Electronically Signed: By: Franchot Gallo M.D. On: 05/10/2020 15:15   CT ANGIO HEAD CODE STROKE  Addendum Date: 05/10/2020   ADDENDUM REPORT: 05/10/2020 15:20 ADDENDUM: These results were called by telephone at the time of interpretation on 05/10/2020 at 3:20 pm to provider [facility] , who verbally acknowledged these results. Electronically Signed   By: Franchot Gallo M.D.   On: 05/10/2020 15:20   Result Date: 05/10/2020 CLINICAL DATA:  Stroke. EXAM: CT ANGIOGRAPHY HEAD AND NECK CT PERFUSION BRAIN TECHNIQUE: Multidetector CT imaging of the head and neck was performed using the standard protocol during bolus administration of intravenous contrast. Multiplanar CT image reconstructions and MIPs were obtained to evaluate the vascular anatomy. Carotid stenosis measurements (when applicable) are obtained utilizing NASCET criteria, using the distal internal carotid diameter as the denominator. Multiphase CT imaging of the brain was performed following IV bolus contrast injection. Subsequent parametric perfusion maps were calculated using RAPID software. CONTRAST:  157mL OMNIPAQUE IOHEXOL 350 MG/ML SOLN COMPARISON:  CT head 05/10/2020.  MRI and MRA head 05/10/2020 FINDINGS: CTA NECK FINDINGS Aortic arch: Standard branching. Imaged portion shows no evidence of aneurysm or dissection. No significant stenosis of the major arch vessel origins. Right carotid system: Mild atherosclerotic calcification right carotid bifurcation without significant stenosis. No irregularity or thrombus in the right carotid. Left carotid system: Mild atherosclerotic disease left carotid without stenosis or irregularity. Vertebral arteries: Both vertebral arteries widely patent. Skeleton: Disc degeneration and spurring. Foraminal narrowing due to spurring bilaterally at C5-6 and C6-7. No acute skeletal abnormality. Other neck: No mass or edema identified. Upper chest: Lung  apices clear bilaterally. Review of the MIP images confirms the above findings CTA HEAD FINDINGS Anterior circulation: Cavernous carotid widely patent bilaterally. Anterior cerebral arteries widely patent. Left middle cerebral artery widely patent. Right M1 segment widely patent. There is occlusion of a large right M3 branch superior division right MCA as noted on prior MRA. This appears acute. Posterior circulation: Normal posterior circulation. No stenosis or large vessel occlusion. Venous sinuses: Normal venous enhancement. Anatomic variants: None Review of the MIP images confirms the above findings CT Brain Perfusion Findings: ASPECTS: 10 CBF (<30%) Volume: 76mL Perfusion (Tmax>6.0s) volume: 59mL Mismatch Volume: 40mL Infarction Location:Right insula and right operculum. IMPRESSION: 1. Abnormal CT perfusion. 29 mm core infarct with additional 33 mL of surrounding penumbra in the right insula and operculum corresponding to restricted diffusion on MRI. 2. Occluded right superior branch of M3 which is a relatively large vessel compatible with an embolus. 3. No significant carotid or vertebral artery stenosis in the neck. Electronically Signed: By: Franchot Gallo M.D. On: 05/10/2020 15:15  CT ANGIO NECK CODE STROKE  Addendum Date: 05/10/2020   ADDENDUM REPORT: 05/10/2020 15:20 ADDENDUM: These results were called by telephone at the time of interpretation on 05/10/2020 at 3:20 pm to provider [facility] , who verbally acknowledged these results. Electronically Signed   By: Franchot Gallo M.D.   On: 05/10/2020 15:20   Result Date: 05/10/2020 CLINICAL DATA:  Stroke. EXAM: CT ANGIOGRAPHY HEAD AND NECK CT PERFUSION BRAIN TECHNIQUE: Multidetector CT imaging of the head and neck was performed using the standard protocol during bolus administration of intravenous contrast. Multiplanar CT image reconstructions and MIPs were obtained to evaluate the vascular anatomy. Carotid stenosis measurements (when applicable)  are obtained utilizing NASCET criteria, using the distal internal carotid diameter as the denominator. Multiphase CT imaging of the brain was performed following IV bolus contrast injection. Subsequent parametric perfusion maps were calculated using RAPID software. CONTRAST:  112mL OMNIPAQUE IOHEXOL 350 MG/ML SOLN COMPARISON:  CT head 05/10/2020.  MRI and MRA head 05/10/2020 FINDINGS: CTA NECK FINDINGS Aortic arch: Standard branching. Imaged portion shows no evidence of aneurysm or dissection. No significant stenosis of the major arch vessel origins. Right carotid system: Mild atherosclerotic calcification right carotid bifurcation without significant stenosis. No irregularity or thrombus in the right carotid. Left carotid system: Mild atherosclerotic disease left carotid without stenosis or irregularity. Vertebral arteries: Both vertebral arteries widely patent. Skeleton: Disc degeneration and spurring. Foraminal narrowing due to spurring bilaterally at C5-6 and C6-7. No acute skeletal abnormality. Other neck: No mass or edema identified. Upper chest: Lung apices clear bilaterally. Review of the MIP images confirms the above findings CTA HEAD FINDINGS Anterior circulation: Cavernous carotid widely patent bilaterally. Anterior cerebral arteries widely patent. Left middle cerebral artery widely patent. Right M1 segment widely patent. There is occlusion of a large right M3 branch superior division right MCA as noted on prior MRA. This appears acute. Posterior circulation: Normal posterior circulation. No stenosis or large vessel occlusion. Venous sinuses: Normal venous enhancement. Anatomic variants: None Review of the MIP images confirms the above findings CT Brain Perfusion Findings: ASPECTS: 10 CBF (<30%) Volume: 29mL Perfusion (Tmax>6.0s) volume: 58mL Mismatch Volume: 70mL Infarction Location:Right insula and right operculum. IMPRESSION: 1. Abnormal CT perfusion. 29 mm core infarct with additional 33 mL of  surrounding penumbra in the right insula and operculum corresponding to restricted diffusion on MRI. 2. Occluded right superior branch of M3 which is a relatively large vessel compatible with an embolus. 3. No significant carotid or vertebral artery stenosis in the neck. Electronically Signed: By: Franchot Gallo M.D. On: 05/10/2020 15:15     EKG:   Not done in ED, will get one.   Assessment/Plan Principal Problem:   Stroke [facility]) Active Problems:   Type II diabetes mellitus with renal manifestations (HCC)   Hypertension   Hyperlipidemia   CKD (chronic kidney disease), stage IV ([place])   Fall   GERD (gastroesophageal reflux disease)   Stroke [facility]): MRI and MRA showed acute infarct right insula and parietal operculum with occlusion of the right M3 branch superior division. Neurologist, Dr.Khaliqdina evaluated patient and recommended to transfer pt to [facility] for urgent thrombectomy by IR, Dr. Estanislado Pandy. Dr. Lorrin Goodell has arranged further treatment in [facility] hospital by Dr. Leonie Man of neurology. Pt will be in neurologist's service, not in hospitalist's service. Dr. Lorrin Goodell and I did [place] on MedSurg bed for observation in [facility] - will hold oral Bp meds to allow permissive HTN in the setting of acute stroke  -  Check carotid dopplers  - ASA and lipitor - fasting lipid panel and HbA1c  - 2D transthoracic echocardiography  - swallowing screen. If fails, will get SLP - Check UDS  - PT/OT consult  Type II diabetes mellitus with renal manifestations [facility]): Recent A1c 8.2, poorly controlled.  Patient is taking glipizide and 7/30 insulin -Sliding scale insulin -Change dose of 70/30 insulin from 42-52 units twice daily to 30-45 units twice daily  Hypertension -Hold blood pressure medications -IV hydralazine for SBP >220, DBP> 120  Hyperlipidemia -Lipitor 40 mg daily  CKD (chronic kidney disease), stage IV [facility]): Recent baseline creatinine 1.8-2.3.  His creatinine is  at 2.26, BUN 31, close to baseline. -Follow-up of by [facility]  Fall -PT/OT when able to  GERD (gastroesophageal reflux disease) -Protonix         DVT ppx: SQ Lovenox Code Status: Full code Family Communication:     Yes, patient's wife at bed side Disposition Plan:  Anticipate discharge back to previous environment Consults called:  Dr. Lorrin Goodell Admission status and Level of care: Med-Surg:     for obs  Status is: Observation  The patient remains OBS appropriate and will d/c before 2 midnights.  Dispo: The patient is from: Home              Anticipated d/c is to: transfer to [facility] hospital              Patient currently is not medically stable to d/c.   Difficult to place patient No            Date of Service 05/10/2020    [place] [facility]   If 7PM-7AM, please contact night-coverage [email or web address] 05/10/2020, 3:33 PM

## 2020-05-10 NOTE — Anesthesia Postprocedure Evaluation (Signed)
Anesthesia Post Note  Patient: Thomas Sampson  Procedure(s) Performed: IR WITH ANESTHESIA (N/A )     Patient location during evaluation: SICU Anesthesia Type: General Level of consciousness: sedated and patient remains intubated per anesthesia plan Pain management: pain level controlled Vital Signs Assessment: post-procedure vital signs reviewed and stable Respiratory status: patient remains intubated per anesthesia plan and patient on ventilator - see flowsheet for VS Cardiovascular status: stable Anesthetic complications: no   No complications documented.  Last Vitals:  Vitals:   05/10/20 2022 05/10/20 2042  BP:    Pulse:    Resp:    Temp:    SpO2: 97% 99%    Last Pain:  Vitals:   05/10/20 2000  TempSrc: Axillary                 Nolon Nations

## 2020-05-10 NOTE — Progress Notes (Signed)
Patient ID: Thomas Sampson, male   DOB: Jul 02, 1945, 75 y.o.   MRN: [id number] [address]  New onset RT gaze deviation and Lt sided neglect. CT Brain No ICH ASPECTS ?9. CTA Occluded  RT MCA sup division. CTP core of 35ml Tmax > 6s 62 ml  Endovascular treatment D/W spose and patient over the phone in the presence of neurologist. Procedure,reasons and alternatives discussed. Risk of ICH of 10 % ,worsening neuro deficit,death and inability to revascularize reviewed. Spouse expressed understanding  And provided consent for the treatment. S.Katelee Schupp MD

## 2020-05-10 NOTE — Procedures (Signed)
S/P RT common carotid arteriogram followed by revascularization of occluded sup division Rt MCA with x 1pass with contact aspiration and x 1 pass with 38mm x 30 mm solitaire FR retriever and  Contact aspiration achieving a TICI 2C revascularization. POst CT Brain Contras stain in the RT putamen and mod hyperdensity in the RT  Post perisylvian region. 19F angioseal for hemostasis at the RT groin puncture site. Distal pulses all dopplerable. Patient left intubated for airway protection. S.Khristy Kalan MD

## 2020-05-10 NOTE — H&P (Signed)
Admission H&P    Chief Complaint: code stroke HPI: Thomas Sampson a 74 y.o.malewith medical history significant ofhypertension, hyperlipidemia, diabetes mellitus, GERD, CKD-4, kidney stone, claudication, who presented to [facility] with left-sided weakness, slurred speech upon arising from sleep at 7:30 AM.  He was last known normal at 11 PM yesterday.  Patient symptoms initially improved upon arrival and he had an NIH stroke scale of 0 with only mild left-sided weakness and diminished fine motor skills..  CT scan of the head was negative for acute abnormalities.  He was seen by neurologist Dr. 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  CT perfusion scan was obtained which showed a ischemic core of 29 cc and a penumbra of 63 cc and patient was discussed with neuro interventionist Dr. Estanislado Pandy and transferred to [facility] for consideration for mechanical thrombectomy.  Patient neurological exam decline while he was in [facility] and his NIH stroke scale increased to 10 with right gaze deviation left visual field cut and mild left-sided ataxia. LSN: 11 PM on 05/09/2020 tPA Given: No is outside time window Premorbid modified Rankin scale 0 -Stroke scale on admission to [facility] 10  Past Medical History:  Diagnosis Date  . Chronic kidney disease    kidney stones  . Diabetes mellitus without complication ([place])   . Hyperlipidemia   . Hypertension   . Tinea pedis     Past Surgical History:  Procedure Laterality Date  . COLON SURGERY    . COLONOSCOPY WITH PROPOFOL N/A 09/18/2016   Procedure: COLONOSCOPY WITH PROPOFOL;  Surgeon: Lollie Sails, MD;  Location: [facility] ENDOSCOPY;  Service: Endoscopy;  Laterality: N/A;  . EYE SURGERY     cataract extraction    Family History  Problem Relation Age of  Onset  . Cancer Mother   . Heart attack Father   . Heart attack Sister   . Heart disease Sister   . Diabetes Brother    Social History:  reports that he has quit smoking. His smoking use included cigarettes. He quit after 40.00 years of use. He has never used smokeless tobacco. He reports that he does not drink alcohol and does not use drugs.  Allergies:  Allergies  Allergen Reactions  . Demerol [Meperidine]     (Not in a hospital admission)   ROS: As documented above in history of presenting illness and all other systems negative  Physical Examination: There were no vitals taken for this visit.  Mildly obese elderly Caucasian male not in distress. . Afebrile. Head is nontraumatic. Neck is supple without bruit.    Cardiac exam no murmur or gallop. Lungs are clear to auscultation. Distal pulses are well felt.  Neurologic Examination: Patient is awake alert his head is deviated to the right along with forced right gaze deviation but is able to look to the left all the way past midline.  He has moderate dysarthria but can be understood.  He follows all commands well.  Blinks to threat on the right but not on the left.  He has moderate left lower facial weakness.  Tongue midline.  Motor system exam shows subtle drift only on simultaneous testing and left LLE even when tested in isolation.  Touch pinprick sensation seems preserved bilaterally but there is left-sided sensory inattention.  He does not have left hemibody  neglect.  Deep tendon reflexes are depressed on the left.  Plantars left equal to right downgoing.  Gait not tested   NIH stroke scale 10 Premorbid modified Rankin score 0  Results for orders placed or performed during the hospital encounter of 05/10/20 (from the past 48 hour(s))  Resp Panel by RT-PCR (Flu A&B, Covid) Nasopharyngeal Swab     Status: None   Collection Time: 05/10/20  9:35 AM   Specimen: Nasopharyngeal Swab; Nasopharyngeal(NP) swabs in vial transport medium   Result Value Ref Range   SARS Coronavirus 2 by RT PCR NEGATIVE NEGATIVE    Comment: (NOTE) SARS-CoV-2 target nucleic acids are NOT DETECTED.  The SARS-CoV-2 RNA is generally detectable in upper respiratory specimens during the acute phase of infection. The lowest concentration of SARS-CoV-2 viral copies this assay can detect is 138 copies/mL. A negative result does not preclude SARS-Cov-2 infection and should not be used as the sole basis for treatment or other patient management decisions. A negative result may occur with  improper specimen collection/handling, submission of specimen other than nasopharyngeal swab, presence of viral mutation(s) within the areas targeted by this assay, and inadequate number of viral copies(<138 copies/mL). A negative result must be combined with clinical observations, patient history, and epidemiological information. The expected result is Negative.  Fact Sheet for Patients:  EntrepreneurPulse.com.au  Fact Sheet for Healthcare Providers:  IncredibleEmployment.be  This test is no t yet approved or cleared by the [place] FDA and  has been authorized for detection and/or diagnosis of SARS-CoV-2 by FDA under an Emergency Use Authorization (EUA). This EUA will remain  in effect (meaning this test can be used) for the duration of the COVID-19 declaration under Section 564(b)(1) of the Act, 21 U.S.C.section 360bbb-3(b)(1), unless the authorization is terminated  or revoked sooner.       Influenza A by PCR NEGATIVE NEGATIVE   Influenza B by PCR NEGATIVE NEGATIVE    Comment: (NOTE) The Xpert Xpress SARS-CoV-2/FLU/RSV plus assay is intended as an aid in the diagnosis of influenza from Nasopharyngeal swab specimens and should not be used as a sole basis for treatment. Nasal washings and aspirates are unacceptable for Xpert Xpress SARS-CoV-2/FLU/RSV testing.  Fact Sheet for  Patients: EntrepreneurPulse.com.au  Fact Sheet for Healthcare Providers: IncredibleEmployment.be  This test is not yet approved or cleared by the [place] FDA and has been authorized for detection and/or diagnosis of SARS-CoV-2 by FDA under an Emergency Use Authorization (EUA). This EUA will remain in effect (meaning this test can be used) for the duration of the COVID-19 declaration under Section 564(b)(1) of the Act, 21 U.S.C. section 360bbb-3(b)(1), unless the authorization is terminated or revoked.  Performed at [facility], [address] [place], [place] [address]   Protime-INR     Status: None   Collection Time: 05/10/20  9:40 AM  Result Value Ref Range   Prothrombin Time 12.3 11.4 - 15.2 seconds   INR 1.0 0.8 - 1.2    Comment: (NOTE) INR goal varies based on device and disease states. Performed at [facility], [address] [place], [place] [address]   APTT     Status: None   Collection Time: 05/10/20  9:40 AM  Result Value Ref Range   aPTT 24 24 - 36 seconds    Comment: Performed at [facility], [address] [place], [place] [address]  CBC     Status: None   Collection Time: 05/10/20  9:40 AM  Result Value  Ref Range   WBC 9.0 4.0 - 10.5 K/uL   RBC 4.62 4.22 - 5.81 MIL/uL   Hemoglobin 14.6 13.0 - 17.0 g/dL   HCT 42.6 39.0 - 52.0 %   MCV 92.2 80.0 - 100.0 fL   MCH 31.6 26.0 - 34.0 pg   MCHC 34.3 30.0 - 36.0 g/dL   RDW 13.0 11.5 - 15.5 %   Platelets 270 150 - 400 K/uL   nRBC 0.0 0.0 - 0.2 %    Comment: Performed at [facility], [address] [place], [place] [address]  Differential     Status: Abnormal   Collection Time: 05/10/20  9:40 AM  Result Value Ref Range   Neutrophils Relative % 70 %   Neutro Abs 6.5 1.7 - 7.7 K/uL   Lymphocytes Relative 17 %   Lymphs Abs 1.5 0.7 - 4.0 K/uL   Monocytes Relative 8 %   Monocytes Absolute 0.7 0.1 - 1.0 K/uL   Eosinophils  Relative 2 %   Eosinophils Absolute 0.2 0.0 - 0.5 K/uL   Basophils Relative 1 %   Basophils Absolute 0.1 0.0 - 0.1 K/uL   Immature Granulocytes 2 %   Abs Immature Granulocytes 0.14 (H) 0.00 - 0.07 K/uL    Comment: Performed at [facility], [address] [place], [place] [address]  Comprehensive metabolic panel     Status: Abnormal   Collection Time: 05/10/20  9:40 AM  Result Value Ref Range   Sodium 139 135 - 145 mmol/L   Potassium 3.7 3.5 - 5.1 mmol/L   Chloride 103 98 - 111 mmol/L   CO2 26 22 - 32 mmol/L   Glucose, Bld 207 (H) 70 - 99 mg/dL    Comment: Glucose reference range applies only to samples taken after fasting for at least 8 hours.   BUN 31 (H) 8 - 23 mg/dL   Creatinine, Ser 2.26 (H) 0.61 - 1.24 mg/dL   Calcium 9.7 8.9 - 10.3 mg/dL   Total Protein 7.2 6.5 - 8.1 g/dL   Albumin 4.1 3.5 - 5.0 g/dL   AST 20 15 - 41 U/L   ALT 16 0 - 44 U/L   Alkaline Phosphatase 45 38 - 126 U/L   Total Bilirubin 0.8 0.3 - 1.2 mg/dL   GFR, Estimated 30 (L) >60 mL/min    Comment: (NOTE) Calculated using the CKD-EPI Creatinine Equation (2021)    Anion gap 10 5 - 15    Comment: Performed at [facility], [address] [place],  [address]  Lipid panel     Status: Abnormal   Collection Time: 05/10/20  9:40 AM  Result Value Ref Range   Cholesterol 181 0 - 200 mg/dL   Triglycerides 238 (H) <150 mg/dL   HDL 34 (L) >40 mg/dL   Total CHOL/HDL Ratio 5.3 RATIO   VLDL 48 (H) 0 - 40 mg/dL   LDL Cholesterol 99 0 - 99 mg/dL    Comment:        Total Cholesterol/HDL:CHD Risk Coronary Heart Disease Risk Table                     Men   Women  1/2 Average Risk   3.4   3.3  Average Risk       5.0   4.4  2 X Average Risk   9.6   7.1  3 X Average Risk  23.4   11.0        Use  the calculated Patient Ratio above and the CHD Risk Table to determine the patient's CHD Risk.        ATP III CLASSIFICATION (LDL):  <100     mg/dL   Optimal  100-129  mg/dL   Near or Above                     Optimal  130-159  mg/dL   Borderline  160-189  mg/dL   High  >190     mg/dL   Very High Performed at [facility], [address] [place], [place] [address]   Glucose, capillary     Status: Abnormal   Collection Time: 05/10/20  2:32 PM  Result Value Ref Range   Glucose-Capillary 291 (H) 70 - 99 mg/dL    Comment: Glucose reference range applies only to samples taken after fasting for at least 8 hours.   CT HEAD WO CONTRAST  Result Date: 05/10/2020 CLINICAL DATA:  Discoordination and slurred speech with fall EXAM: CT HEAD WITHOUT CONTRAST TECHNIQUE: Contiguous axial images were obtained from the base of the skull through the vertex without intravenous contrast. COMPARISON:  None. FINDINGS: Brain: There is mild diffuse atrophy. There is no appreciable intracranial mass, hemorrhage, extra-axial fluid collection, or midline shift. The brain parenchyma appears unremarkable. No acute infarct is demonstrable on this study. Vascular: There is no hyperdense vessel. There is slight calcification in the carotid siphon regions bilaterally. Skull: Bony calvarium appears intact. Sinuses/Orbits: There is mucosal thickening in the medial superior left maxillary antrum. There is slight mucosal thickening in several ethmoid air cells. Orbits appear symmetric bilaterally. Other: Visualized mastoid air cells clear. IMPRESSION: Mild atrophy. Normal appearing brain parenchyma. No acute infarct demonstrated by CT. No mass or hemorrhage. Slight arterial vascular calcification noted. Areas of paranasal sinus mucosal thickening noted. Electronically Signed   By: Lowella Grip III M.D.   On: 05/10/2020 10:07   MR ANGIO HEAD WO CONTRAST  Result Date: 05/10/2020 CLINICAL DATA:  TIA. Dysarthria and slurred speech. Mildly weak on the left. EXAM: MRI HEAD WITHOUT CONTRAST MRA HEAD WITHOUT CONTRAST TECHNIQUE: Multiplanar, multiecho pulse sequences of the brain and surrounding structures were obtained  without intravenous contrast. Angiographic images of the head were obtained using MRA technique without contrast. COMPARISON:  CT head 05/10/2020 FINDINGS: MRI HEAD FINDINGS Brain: Acute infarct in the right insular cortex and right parietal operculum. There is FLAIR hyperintensity in the right MCA branches in this area due to slow flow or thrombosis. There is susceptibility in the right sylvian fissure compatible with thrombus in the right MCA branch. Mild atrophy. Mild chronic microvascular ischemic change in the white matter. 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 Negative for mass lesion. Vascular: Normal arterial flow voids at the base of brain. Susceptibility in the right sylvian fissure with FLAIR hyperintensity in the right MCA vessels compatible with slow flow or occlusion Skull and upper cervical spine: Negative Sinuses/Orbits: Mild mucosal edema paranasal sinuses. Bilateral cataract extraction Other: None MRA HEAD FINDINGS Internal carotid artery widely patent bilaterally without stenosis. Anterior cerebral arteries widely patent with mild irregularity. Left middle cerebral artery widely patent with mild irregularity. Right M1 segment patent. Occlusion of the superior division of the right M3 vessel. Inferior division right M2 widely patent. Posterior circulation normal without significant stenosis. Fetal origin right posterior cerebral artery. IMPRESSION: 1. 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 2. Mild chronic microvascular ischemic change in the white matter 3. Occlusion of the right  M3 branch superior division Electronically Signed   By: Franchot Gallo M.D.   On: 05/10/2020 13:52   MR BRAIN WO CONTRAST  Result Date: 05/10/2020 CLINICAL DATA:  TIA. Dysarthria and slurred speech. Mildly weak on the left. EXAM: MRI HEAD WITHOUT CONTRAST MRA HEAD WITHOUT CONTRAST TECHNIQUE: Multiplanar, multiecho pulse sequences of the brain and surrounding structures were obtained without  intravenous contrast. Angiographic images of the head were obtained using MRA technique without contrast. COMPARISON:  CT head 05/10/2020 FINDINGS: MRI HEAD FINDINGS Brain: Acute infarct in the right insular cortex and right parietal operculum. There is FLAIR hyperintensity in the right MCA branches in this area due to slow flow or thrombosis. There is susceptibility in the right sylvian fissure compatible with thrombus in the right MCA branch. Mild atrophy. Mild chronic microvascular ischemic change in the white matter. 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 Negative for mass lesion. Vascular: Normal arterial flow voids at the base of brain. Susceptibility in the right sylvian fissure with FLAIR hyperintensity in the right MCA vessels compatible with slow flow or occlusion Skull and upper cervical spine: Negative Sinuses/Orbits: Mild mucosal edema paranasal sinuses. Bilateral cataract extraction Other: None MRA HEAD FINDINGS Internal carotid artery widely patent bilaterally without stenosis. Anterior cerebral arteries widely patent with mild irregularity. Left middle cerebral artery widely patent with mild irregularity. Right M1 segment patent. Occlusion of the superior division of the right M3 vessel. Inferior division right M2 widely patent. Posterior circulation normal without significant stenosis. Fetal origin right posterior cerebral artery. IMPRESSION: 1. 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 2. Mild chronic microvascular ischemic change in the white matter 3. 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   On: 05/10/2020 13:52   CT CEREBRAL PERFUSION W CONTRAST  Addendum Date: 05/10/2020   ADDENDUM REPORT: 05/10/2020 15:20 ADDENDUM: These results were called by telephone at the time of interpretation on 05/10/2020 at 3:20 pm to provider [facility] , who verbally acknowledged these results. Electronically Signed   By: Franchot Gallo M.D.   On: 05/10/2020 15:20   Result Date: 05/10/2020 CLINICAL DATA:  Stroke. EXAM: CT ANGIOGRAPHY HEAD AND NECK CT PERFUSION BRAIN TECHNIQUE: Multidetector CT imaging of the head and neck was performed using the standard protocol during bolus administration of intravenous contrast. Multiplanar CT image reconstructions and MIPs were obtained to evaluate the vascular anatomy. Carotid stenosis measurements (when applicable) are obtained utilizing NASCET criteria, using the distal internal carotid diameter as the denominator. Multiphase CT imaging of the brain was performed following IV bolus contrast injection. Subsequent parametric perfusion maps were calculated using RAPID software. CONTRAST:  135mL OMNIPAQUE IOHEXOL 350 MG/ML SOLN COMPARISON:  CT head 05/10/2020.  MRI and MRA head 05/10/2020 FINDINGS: CTA NECK FINDINGS Aortic arch: Standard branching. Imaged portion shows no evidence of aneurysm or dissection. No significant stenosis of the major arch vessel origins. Right carotid system: Mild atherosclerotic calcification right carotid bifurcation without significant stenosis. No irregularity or thrombus in the right carotid. Left carotid system: Mild atherosclerotic disease left carotid without stenosis or irregularity. Vertebral arteries: Both vertebral arteries widely patent. Skeleton: Disc degeneration and spurring. Foraminal narrowing due to spurring bilaterally at C5-6 and C6-7. No acute skeletal abnormality. Other neck: No mass or edema identified. Upper chest: Lung apices clear bilaterally. Review of the MIP images confirms the above findings CTA HEAD FINDINGS Anterior circulation: Cavernous carotid widely patent bilaterally. Anterior cerebral arteries widely patent. Left middle cerebral  artery widely patent. Right M1 segment widely patent. There is occlusion of a large right M3 branch superior division right MCA as noted on prior MRA. This appears acute. Posterior circulation: Normal posterior  circulation. No stenosis or large vessel occlusion. Venous sinuses: Normal venous enhancement. Anatomic variants: None Review of the MIP images confirms the above findings CT Brain Perfusion Findings: ASPECTS: 10 CBF (<30%) Volume: 70mL Perfusion (Tmax>6.0s) volume: 95mL Mismatch Volume: 32mL Infarction Location:Right insula and right operculum. IMPRESSION: 1. Abnormal CT perfusion. 29 mm core infarct with additional 33 mL of surrounding penumbra in the right insula and operculum corresponding to restricted diffusion on MRI. 2. Occluded right superior branch of M3 which is a relatively large vessel compatible with an embolus. 3. No significant carotid or vertebral artery stenosis in the neck. Electronically Signed: By: Franchot Gallo M.D. On: 05/10/2020 15:15   CT ANGIO HEAD CODE STROKE  Addendum Date: 05/10/2020   ADDENDUM REPORT: 05/10/2020 15:20 ADDENDUM: These results were called by telephone at the time of interpretation on 05/10/2020 at 3:20 pm to provider [facility] , who verbally acknowledged these results. Electronically Signed   By: Franchot Gallo M.D.   On: 05/10/2020 15:20   Result Date: 05/10/2020 CLINICAL DATA:  Stroke. EXAM: CT ANGIOGRAPHY HEAD AND NECK CT PERFUSION BRAIN TECHNIQUE: Multidetector CT imaging of the head and neck was performed using the standard protocol during bolus administration of intravenous contrast. Multiplanar CT image reconstructions and MIPs were obtained to evaluate the vascular anatomy. Carotid stenosis measurements (when applicable) are obtained utilizing NASCET criteria, using the distal internal carotid diameter as the denominator. Multiphase CT imaging of the brain was performed following IV bolus contrast injection. Subsequent parametric perfusion maps were calculated using RAPID software. CONTRAST:  166mL OMNIPAQUE IOHEXOL 350 MG/ML SOLN COMPARISON:  CT head 05/10/2020.  MRI and MRA head 05/10/2020 FINDINGS: CTA NECK FINDINGS Aortic arch: Standard  branching. Imaged portion shows no evidence of aneurysm or dissection. No significant stenosis of the major arch vessel origins. Right carotid system: Mild atherosclerotic calcification right carotid bifurcation without significant stenosis. No irregularity or thrombus in the right carotid. Left carotid system: Mild atherosclerotic disease left carotid without stenosis or irregularity. Vertebral arteries: Both vertebral arteries widely patent. Skeleton: Disc degeneration and spurring. Foraminal narrowing due to spurring bilaterally at C5-6 and C6-7. No acute skeletal abnormality. Other neck: No mass or edema identified. Upper chest: Lung apices clear bilaterally. Review of the MIP images confirms the above findings CTA HEAD FINDINGS Anterior circulation: Cavernous carotid widely patent bilaterally. Anterior cerebral arteries widely patent. Left middle cerebral artery widely patent. Right M1 segment widely patent. There is occlusion of a large right M3 branch superior division right MCA as noted on prior MRA. This appears acute. Posterior circulation: Normal posterior circulation. No stenosis or large vessel occlusion. Venous sinuses: Normal venous enhancement. Anatomic variants: None Review of the MIP images confirms the above findings CT Brain Perfusion Findings: ASPECTS: 10 CBF (<30%) Volume: 83mL Perfusion (Tmax>6.0s) volume: 71mL Mismatch Volume: 80mL Infarction Location:Right insula and right operculum. IMPRESSION: 1. Abnormal CT perfusion. 29 mm core infarct with additional 33 mL of surrounding penumbra in the right insula and operculum corresponding to restricted diffusion on MRI. 2. Occluded right superior branch of M3 which is a relatively large vessel compatible with an embolus. 3. No significant carotid or vertebral artery stenosis in the neck. Electronically Signed: By: Franchot Gallo M.D. On: 05/10/2020 15:15   CT ANGIO NECK CODE STROKE  Addendum Date: 05/10/2020  ADDENDUM REPORT: 05/10/2020 15:20  ADDENDUM: These results were called by telephone at the time of interpretation on 05/10/2020 at 3:20 pm to provider [facility] , who verbally acknowledged these results. Electronically Signed   By: Franchot Gallo M.D.   On: 05/10/2020 15:20   Result Date: 05/10/2020 CLINICAL DATA:  Stroke. EXAM: CT ANGIOGRAPHY HEAD AND NECK CT PERFUSION BRAIN TECHNIQUE: Multidetector CT imaging of the head and neck was performed using the standard protocol during bolus administration of intravenous contrast. Multiplanar CT image reconstructions and MIPs were obtained to evaluate the vascular anatomy. Carotid stenosis measurements (when applicable) are obtained utilizing NASCET criteria, using the distal internal carotid diameter as the denominator. Multiphase CT imaging of the brain was performed following IV bolus contrast injection. Subsequent parametric perfusion maps were calculated using RAPID software. CONTRAST:  168mL OMNIPAQUE IOHEXOL 350 MG/ML SOLN COMPARISON:  CT head 05/10/2020.  MRI and MRA head 05/10/2020 FINDINGS: CTA NECK FINDINGS Aortic arch: Standard branching. Imaged portion shows no evidence of aneurysm or dissection. No significant stenosis of the major arch vessel origins. Right carotid system: Mild atherosclerotic calcification right carotid bifurcation without significant stenosis. No irregularity or thrombus in the right carotid. Left carotid system: Mild atherosclerotic disease left carotid without stenosis or irregularity. Vertebral arteries: Both vertebral arteries widely patent. Skeleton: Disc degeneration and spurring. Foraminal narrowing due to spurring bilaterally at C5-6 and C6-7. No acute skeletal abnormality. Other neck: No mass or edema identified. Upper chest: Lung apices clear bilaterally. Review of the MIP images confirms the above findings CTA HEAD FINDINGS Anterior circulation: Cavernous carotid widely patent bilaterally. Anterior cerebral arteries widely patent. Left middle cerebral  artery widely patent. Right M1 segment widely patent. There is occlusion of a large right M3 branch superior division right MCA as noted on prior MRA. This appears acute. Posterior circulation: Normal posterior circulation. No stenosis or large vessel occlusion. Venous sinuses: Normal venous enhancement. Anatomic variants: None Review of the MIP images confirms the above findings CT Brain Perfusion Findings: ASPECTS: 10 CBF (<30%) Volume: 67mL Perfusion (Tmax>6.0s) volume: 40mL Mismatch Volume: 43mL Infarction Location:Right insula and right operculum. IMPRESSION: 1. Abnormal CT perfusion. 29 mm core infarct with additional 33 mL of surrounding penumbra in the right insula and operculum corresponding to restricted diffusion on MRI. 2. Occluded right superior branch of M3 which is a relatively large vessel compatible with an embolus. 3. No significant carotid or vertebral artery stenosis in the neck. Electronically Signed: By: Franchot Gallo M.D. On: 05/10/2020 15:15    Assessment: 75 y.o. 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  Patient has presented outside the time window for thrombolysis.  CT perfusion scan shows a small ischemic penumbra but there appears to be of clinical radiological disparity hence patient will be taken for emergent diagnostic cerebral angiogram followed by mechanical thrombectomy if feasible lesion is identified.  Stroke Risk Factors - hyperlipidemia, hypertension and Obesity  Plan: Admit to neurological intensive care unit after interventional procedure.  Close neurological monitoring with strict control of blood pressure.  Repeat MRI scan after the procedure.  Antiplatelet therapy if no hemorrhage.  Check echocardiogram, lipid profile hemoglobin A1c. No family available at the bedside for discussion at the present time.   Discussed with Dr. Estanislado Pandy and Dr.Khaliqdina This patient is critically ill and at significant risk of neurological worsening, death and care requires constant monitoring of  vital signs, hemodynamics,respiratory and cardiac monitoring, extensive review of multiple databases, frequent neurological assessment, discussion with family, other specialists and medical decision making of high complexity.I have made any additions or clarifications directly to the above note.This critical care time does not reflect procedure time, or teaching time or supervisory time of PA/NP/Med Resident etc but could involve care discussion time.  I spent 60 minutes of neurocritical care time  in the care of  this patient.  Antony Contras, MD  05/10/2020, 5:42 PM

## 2020-05-10 NOTE — Progress Notes (Signed)
Pt transfer from ED to floor presenting symptoms of stroke,  NIHSS done, patient presented with facial droop and drooling and slurred speech, left side weakness. MRI showed clot in his right side of brain, charge nurse made aware neurologist. neurologist, Probation officer and charge nurse called code stroke , CT done and pt transfer to the [facility] for thrombectomy

## 2020-05-10 NOTE — Progress Notes (Signed)
Patient admitted from ED following acute changes requiring more frequent monitoring and escalation of care. MD called and MRI to be completed STAT. MRI called and notified. Primary RN and [facility] aware. Orma Flaming, RN

## 2020-05-10 NOTE — Sedation Documentation (Addendum)
Patient transported to [facility] ICU with CRNA. Met by floor RN upon arrival to room. Groin site assessed. Pink, warm and dry. No drainage noted from dressing. Soft to palpation, no hematoma noted. Distal pulses intact. +2 bilateral DP pulses and doppler bilateral PT pulses.

## 2020-05-10 NOTE — ED Triage Notes (Signed)
C/O being uncoordinated and slurred speech this morning.  Wife reports patient fell out of bed this morning and she then noticed patient was uncoordinated.   Went to bed last night at around 2300.  Patient is AAOx3.  Skin warm and dry.  MAE equally and strong.  No numbness.  Speech clear.  On arrival, patient appeared to be unbalanced.  Wife states speech has improved since arrival to ED, but is not normal.

## 2020-05-10 NOTE — ED Notes (Addendum)
Transport here to transport pt and while speaking to pt after he transferred with steady gait to hospital bed he started drooling and this RN noticed a R facial droop and slurred speech, Dr. Blaine Hamper made aware and per MD pt will go to floor and go to MRI from there, charge RN aware as well. Pt BP stable.

## 2020-05-10 NOTE — Progress Notes (Signed)
   05/10/20 1437  Clinical Encounter Type  Visited With Patient  Visit Type Initial;Spiritual support;Social support  Referral From Nurse  Consult/Referral To Chaplain  Stress Factors  Family Stress Factors Loss of control;Major life changes;Exhausted   Chaplain responded to Code stroke page. PT was already taken for an MRI when the Chaplain arrived. Chaplain provided presence and reflective listening for the PT"s wife. Pt's wife said she was fine for the moment but when son arrived it may be more difficult. Chaplain let Pt's wife know Chaplains are available if needed.

## 2020-05-10 NOTE — Consult Note (Addendum)
NAME:  Thomas Sampson, MRN:  [id number], DOB:  July 21, 1945, LOS: 0 ADMISSION DATE:  05/10/2020, CONSULTATION DATE: 4/11 REFERRING MD:  Leonie Man, CHIEF COMPLAINT:  Vent management for respiratory failure and critical care services    History of Present Illness:  This is a 75 year old male who presented initially to [facility] on 4/11 after waking up at 7:30 AM with new left-sided weakness and slurred speech.  He had last been seen normal at 11 PM the night prior.  His initial symptoms improved upon arrival to the emergency room with only left-sided weakness and diminished fine motor skills with a NIH scale of 0.  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  CT perfusion showed a ischemic cord 29 cm in the penumbra he was referred to [facility] for mechanical thrombectomy.  He had clinical decline while at [facility] with decline of NIH stroke scale to 10, right gaze deviation and left visual field cut and left-sided ataxia.  He arrived at [facility] later in the afternoon on 4/11 he was emergently brought to interventional radiology where he underwent revascularization of occluded superior division right MCA through mechanical thrombectomy.  He was admitted to the intensive care postoperatively on mechanical ventilation for further supportive care  Pertinent  Medical History  Hypertension, hyperlipidemia, diabetes, CKD stage IV, GERD, prior renal calculi, claudication.  Significant Hospital Events: Including procedures, antibiotic start and stop dates in addition to other pertinent events   . 4/11 admitted with acute right MCA stroke.  Initial NIH 0, decompensated in the emergency room at [facility] with NIH scale of 10.  He was not a thrombolytic candidate given unknown time of initial symptoms.  On time of transfer had right gaze deviation left visual field cut and left-sided ataxia.   Based on CT brain perfusion scan underwent successful right MCA thrombectomy post CT brain showed contrast staining in the right putamen and moderate hyperdensity in the right posterior perisylvian region.  Return to the intensive care on mechanical ventilation  Interim History / Subjective:  He just received neuromuscular blockade prior to transport so unresponsive, mildly hypotensive on propofol infusion  Objective   Height 5\' 9"  (1.753 m).        Intake/Output Summary (Last 24 hours) at 05/10/2020 1822 Last data filed at 05/10/2020 1813 Gross per 24 hour  Intake 1050 ml  Output 70 ml  Net 980 ml   There were no vitals filed for this visit.  Examination: General: Well-developed 75 year old male patient currently sedated and paralyzed with medications HENT: Normocephalic atraumatic orally intubated mucous membranes moist pupils equal reactive Lungs: Clear equal chest rise no accessory use Cardiovascular: Regular rate and rhythm no murmur rub or gallop Abdomen: Soft not tender no organomegaly bowel sounds are present Extremities: Warm and dry with brisk capillary refill arterial dressing on right groin is intact without staining Neuro: GCS currently 3 awaiting neuromuscular blockade to subside GU: Due to void  Labs/imaging that I havepersonally reviewed  (right click and "Reselect all SmartList Selections" daily)  I have reviewed see below  Resolved Hospital Problem list    Assessment & Plan:  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  Remains intubated post interventional  radiology CXR w/ RUL collapse  Plan Follow-up arterial blood gas to assess ventilator settings HT saline neb Chest PT via bed Respiratory culture  Portable chest x-ray in am  VAP bundle PAD protocol, RASS goal -2 and allow for frequent neuro checks  Drug-induced hypotension.  Suspect this is secondary to sedation Plan Holding hydrochlorothiazide, lisinopril, and Lopressor. BP management goal 120-140 (start neo)  Acute kidney injury.  Looks like baseline serum creatinine 1.04.  He has had IV dye load, currently serum creatinine 2.26 Plan Continue maintenance IV fluids we will continue saline for now Strict intake output Ensure mean arterial pressure greater than 65, need to avoid hypotension Renal dose medications A.m. chemistry  History of diabetes with what appears to be diabetic neuropathy at baseline Plan Sliding scale insulin Continue Neurontin 300 mg 3 times daily  History of hyperlipidemia Plan Consider starting statin post stroke.  Best practice (right click and "Reselect all SmartList Selections" daily)  Diet:  NPO Pain/Anxiety/Delirium protocol (if indicated): Yes (RASS goal -2) VAP protocol (if indicated): Yes DVT prophylaxis: SCD GI prophylaxis: PPI Glucose control:  SSI Yes Central venous access:  N/A Arterial line:  N/A Foley:  N/A Mobility:  bed rest  PT consulted: N/A Last date of multidisciplinary goals of care discussion [pending ] Code Status:  full code Disposition: ICU care   Labs   CBC: Recent Labs  Lab 05/10/20 0940  WBC 9.0  NEUTROABS 6.5  HGB 14.6  HCT 42.6  MCV 92.2  PLT 132    Basic Metabolic Panel: Recent Labs  Lab 05/10/20 0940  NA 139  K 3.7  CL 103  CO2 26  GLUCOSE 207*  BUN 31*  CREATININE 2.26*  CALCIUM 9.7   GFR: Estimated Creatinine Clearance: 33.4 mL/min (A) (by C-G formula based on SCr of 2.26 mg/dL (H)). Recent Labs  Lab 05/10/20 0940  WBC 9.0    Liver Function Tests: Recent Labs  Lab  05/10/20 0940  AST 20  ALT 16  ALKPHOS 45  BILITOT 0.8  PROT 7.2  ALBUMIN 4.1   No results for input(s): LIPASE, AMYLASE in the last 168 hours. No results for input(s): AMMONIA in the last 168 hours.  ABG No results found for: PHART, PCO2ART, PO2ART, HCO3, TCO2, ACIDBASEDEF, O2SAT   Coagulation Profile: Recent Labs  Lab 05/10/20 0940  INR 1.0    Cardiac Enzymes: No results for input(s): CKTOTAL, CKMB, CKMBINDEX, TROPONINI in the last 168 hours.  HbA1C: No results found for: HGBA1C  CBG: Recent Labs  Lab 05/10/20 1432  GLUCAP 291*    Review of Systems:   Not able   Past Medical History:  He,  has a past medical history of Chronic kidney disease, Diabetes mellitus without complication ([place]), Hyperlipidemia, Hypertension, and Tinea pedis.   Surgical History:   Past Surgical History:  Procedure Laterality Date  . COLON SURGERY    . COLONOSCOPY WITH PROPOFOL N/A 09/18/2016   Procedure: COLONOSCOPY WITH PROPOFOL;  Surgeon: Lollie Sails, MD;  Location: [facility] ENDOSCOPY;  Service: Endoscopy;  Laterality: N/A;  . EYE SURGERY     cataract extraction     Social History:   reports that he has quit smoking. His smoking use included cigarettes. He quit after 40.00 years of use. He has never used smokeless tobacco. He reports that he does not drink alcohol and does not  use drugs.   Family History:  His family history includes Cancer in his mother; Diabetes in his brother; Heart attack in his father and sister; Heart disease in his sister.   Allergies Allergies  Allergen Reactions  . Demerol [Meperidine]      Home Medications  Prior to Admission medications   Medication Sig Start Date End Date Taking? Authorizing Provider  Alpha-Lipoic Acid 600 MG TABS Take by mouth.    [provider]  amLODipine (NORVASC) 2.5 MG tablet Take 2.5 mg by mouth daily.    [provider]  aspirin EC 81 MG tablet Take by mouth.    [provider]   calcitRIOL (ROCALTROL) 0.25 MCG capsule Take 0.25 mcg by mouth daily. 04/26/20   [provider]  Cholecalciferol 2000 units CAPS Take 2,000 Units by mouth daily.    [provider]  cholestyramine Lucrezia Starch) 4 GM/DOSE powder Take by mouth 2 (two) times daily with a meal. Patient not taking: No sig reported    [provider]  colestipol (COLESTID) 1 g tablet Take 1 g by mouth 2 (two) times daily. 04/08/20   [provider]  cyanocobalamin (,VITAMIN B-12,) 1000 MCG/ML injection Inject 9ml IM once very 4 weeks then once a month for 4 months. Patient not taking: No sig reported 09/27/18   [provider]  cyanocobalamin 1000 MCG tablet Take 1,000 mcg by mouth daily.    [provider]  gabapentin (NEURONTIN) 300 MG capsule Take by mouth 3 (three) times daily.  08/03/17 10/15/18  [provider]  glipiZIDE (GLUCOTROL XL) 10 MG 24 hr tablet Take 10 mg by mouth at bedtime.    [provider]  glucose blood (ONETOUCH ULTRA) test strip Use 2 (two) times daily Dx E11.9 07/26/18   [provider]  hydrochlorothiazide (HYDRODIURIL) 12.5 MG tablet Take 12.5 mg by mouth daily.    [provider]  insulin aspart (NOVOLOG) 100 UNIT/ML injection Inject 26 Units into the skin AC breakfast. take 26 units before breakfast and 22 units before supper Patient not taking: No sig reported    [provider]  insulin aspart protamine - aspart (NOVOLOG 70/30 MIX) (70-30) 100 UNIT/ML FlexPen 42-52 Units. 42 units qam and 52 qhs 05/01/17   [provider]  Insulin Pen Needle (BD PEN NEEDLE NANO U/F) 32G X 4 MM MISC USE WITH INJECTIONS 2 TIMES DAILY 07/26/18   [provider]  lisinopril (PRINIVIL,ZESTRIL) 40 MG tablet Take 40 mg by mouth daily. Take 20 mg twice a day    [provider]  metFORMIN (GLUCOPHAGE-XR) 500 MG 24 hr tablet Take 1,000 mg by mouth 2 (two) times daily. Patient not taking: No sig  reported    [provider]  metoprolol succinate (TOPROL-XL) 100 MG 24 hr tablet Take 100 mg by mouth daily. Take with or immediately following a meal.    [provider]  pantoprazole (PROTONIX) 40 MG tablet Take 40 mg by mouth daily.    [provider]  pregabalin (LYRICA) 75 MG capsule Take 75 mg by mouth 2 (two) times daily. 05/05/20   [provider]     Critical care time: my cct 34 minutes. 1/3 chart review, 1/3 face to face and plan of care d/w staff and 1/3 developing plan of care     Erick Colace ACNP-BC [place] Pager # [phone] OR # [phone] if no answer    PCCM Attending:   75 YO M, left  sided weakness, acute MCA of left, and right gaze that worsened in the ED. Seen by Neuro IR and decision made for revascularization. This went well, small SAH. Now with AKI and he did receive contrast.   Baseline PMH HTN DMII  Pulse 87   Resp 20   Ht 5\' 9"  (1.753 m)   SpO2 97%   BMI 32.52 kg/m   Gen: elderly male, intubated  HENT: ETT in place, sedated on vent  Heart: RRR s1 s2 Lungs: BL vented breaths   Labs: reviewed   A:  Acute Right MCA Stroke s/p neuro IR intervention  C/b small SAH  RUL atelectasis  AHRF on MV, inability to protect airway on mechanical ventilation  AKI   P:  Holding antiplatelets Stroke w/u started  ECHO and carotid US pending  CPT and Repeat CXR in AM  If RUL remains collapsed on repat cxr may need bronch in AM  VAP ppx  PAD guidelines IVFs Follow up UOP and Scr  Repeat head imaging in AM per neurology  Goal BP 120-140  This patient is critically ill with multiple organ system failure; which, requires frequent high complexity decision making, assessment, support, evaluation, and titration of therapies. This was completed through the application of advanced monitoring technologies and extensive interpretation of multiple databases. During this encounter critical care time was devoted  to patient care services described in this note for 45 minutes.   [facility] 05/10/2020 6:56 PM

## 2020-05-10 NOTE — Discharge Summary (Signed)
Physician Discharge Summary  Thomas Sampson:[id number] DOB: 10/09/1945 DOA: 05/10/2020  PCP: Baxter Hire, MD  Admit date: 05/10/2020 Discharge date: 05/10/2020  Recommendations for Outpatient Follow-up:  -None. Pt is transferred to [facility] for further treatment  Home Health: None Equipment/Devices: None  Discharge Condition: Hemodynamically stable CODE STATUS: Full code Diet recommendation: Heart/carb modified diet  Brief/Interim Summary (HPI)  Thomas Sampson is a 75 y.o. male with medical history significant of hypertension, hyperlipidemia, diabetes mellitus, GERD, CKD-4, kidney stone, claudication, who presents with left-sided weakness, slurred speech.  Patient states that his symptoms started at about 7:30 AM.  Initially patient has slurred speech, left-sided weakness, unsteady gait, left facial droop.  Patient was last known normal at about 11 PM yesterday.  This left-sided weakness was mainly left hand initially.  his symptoms has worsened in the emergency room, particularly the left-sided weakness and numbness in ED.  Patient denies chest pain, cough, shortness breath,, fever and chills.  Patient states that he has intermittent chronic mild diarrhea, which has not changed.  Currently no nausea vomiting, abdominal pain.  No symptoms of UTI.  ED Course: pt was found to have WBC 9.0, INR 1.0, PTT 24, pending COVID-19 PCR, renal function close to baseline, temperature normal, blood pressure 141/100, heart rate 81, RR 20, oxygen saturation 96% on room air.  CT head negative for acute intracranial abnormalities. Pt is placed on MedSurg bed for observation.  Neurologist, Dr.Khaliqdina evaluated patient and recommended to transfer pt to [facility] for urgent thrombectomy by IR, Dr. Estanislado Pandy. Dr. Lorrin Goodell has arranged further treatment in hospital by Dr. Leonie Man  MRI of brain and MRA of head showed: 1. 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 2. Mild  chronic microvascular ischemic change in the white matter 3. Occlusion of the right M3 branch superior division   Subjective  -left-sided weakness, numbness, slurred speech.  Discharge Diagnoses and Hospital Course:   Principal Problem:   Stroke [facility]) Active Problems:   Type II diabetes mellitus with renal manifestations ([place])   Hypertension   Hyperlipidemia   CKD (chronic kidney disease), stage IV ([place])   Fall   GERD (gastroesophageal reflux disease)   Stroke [facility]): MRI and MRA showed acute infarct right insula and parietal operculum with occlusion of the right M3 branch superior division. Neurologist, Dr.Khaliqdina evaluated patient and recommended to transfer pt to [facility] for urgent thrombectomy by IR, Dr. Estanislado Pandy. Dr. Lorrin Goodell has arranged further treatment in [facility] hospital by Dr. Leonie Man of neurology. Pt will be in neurologist's service, not in hospitalist's service. Dr. Lorrin Goodell and I did [place] on MedSurg bed for observation in [facility] - will hold oral Bp meds to allow permissive HTN in the setting of acute stroke  - Check carotid dopplers  - ASA and lipitor - fasting lipid panel and HbA1c  - 2D transthoracic echocardiography  - swallowing screen. If fails, will get SLP - Check UDS  - PT/OT consult  Type II diabetes mellitus with renal manifestations [facility]): Recent A1c 8.2, poorly controlled.  Patient is taking glipizide and 7/30 insulin -Sliding scale insulin -Change dose of 70/30 insulin from 42-52 units twice daily to 30-45 units twice daily  Hypertension -Hold blood pressure medications -IV hydralazine for SBP >220, DBP> 120  Hyperlipidemia -Lipitor 40 mg daily  CKD (chronic kidney disease), stage IV [facility]): Recent baseline creatinine 1.8-2.3.  His creatinine is at 2.26, BUN 31, close to baseline. -Follow-up of by [facility]  Fall -PT/OT  when able to  GERD (gastroesophageal reflux disease) -Protonix   Discharge  Instructions   Allergies as of 05/10/2020      Reactions   Demerol [meperidine]     Med Rec must be completed prior to using this [place]       Allergies  Allergen Reactions  . Demerol [Meperidine]     Consultations:  neurologist   Procedures/Studies: CT HEAD WO CONTRAST  Result Date: 05/10/2020 CLINICAL DATA:  Discoordination and slurred speech with fall EXAM: CT HEAD WITHOUT CONTRAST TECHNIQUE: Contiguous axial images were obtained from the base of the skull through the vertex without intravenous contrast. COMPARISON:  None. FINDINGS: Brain: There is mild diffuse atrophy. There is no appreciable intracranial mass, hemorrhage, extra-axial fluid collection, or midline shift. The brain parenchyma appears unremarkable. No acute infarct is demonstrable on this study. Vascular: There is no hyperdense vessel. There is slight calcification in the carotid siphon regions bilaterally. Skull: Bony calvarium appears intact. Sinuses/Orbits: There is mucosal thickening in the medial superior left maxillary antrum. There is slight mucosal thickening in several ethmoid air cells. Orbits appear symmetric bilaterally. Other: Visualized mastoid air cells clear. IMPRESSION: Mild atrophy. Normal appearing brain parenchyma. No acute infarct demonstrated by CT. No mass or hemorrhage. Slight arterial vascular calcification noted. Areas of paranasal sinus mucosal thickening noted. Electronically Signed   By: Lowella Grip III M.D.   On: 05/10/2020 10:07   MR ANGIO HEAD WO CONTRAST  Result Date: 05/10/2020 CLINICAL DATA:  TIA. Dysarthria and slurred speech. Mildly weak on the left. EXAM: MRI HEAD WITHOUT CONTRAST MRA HEAD WITHOUT CONTRAST TECHNIQUE: Multiplanar, multiecho pulse sequences of the brain and surrounding structures were obtained without intravenous contrast. Angiographic images of the head were obtained using MRA technique without contrast. COMPARISON:  CT head 05/10/2020 FINDINGS: MRI HEAD  FINDINGS Brain: Acute infarct in the right insular cortex and right parietal operculum. There is FLAIR hyperintensity in the right MCA branches in this area due to slow flow or thrombosis. There is susceptibility in the right sylvian fissure compatible with thrombus in the right MCA branch. Mild atrophy. Mild chronic microvascular ischemic change in the white matter. 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 Negative for mass lesion. Vascular: Normal arterial flow voids at the base of brain. Susceptibility in the right sylvian fissure with FLAIR hyperintensity in the right MCA vessels compatible with slow flow or occlusion Skull and upper cervical spine: Negative Sinuses/Orbits: Mild mucosal edema paranasal sinuses. Bilateral cataract extraction Other: None MRA HEAD FINDINGS Internal carotid artery widely patent bilaterally without stenosis. Anterior cerebral arteries widely patent with mild irregularity. Left middle cerebral artery widely patent with mild irregularity. Right M1 segment patent. Occlusion of the superior division of the right M3 vessel. Inferior division right M2 widely patent. Posterior circulation normal without significant stenosis. Fetal origin right posterior cerebral artery. IMPRESSION: 1. 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 2. Mild chronic microvascular ischemic change in the white matter 3. 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   On: 05/10/2020 13:52   MR BRAIN WO CONTRAST  Result Date: 05/10/2020 CLINICAL DATA:  TIA. Dysarthria and slurred speech. Mildly weak on the left. EXAM: MRI HEAD WITHOUT CONTRAST MRA HEAD WITHOUT CONTRAST TECHNIQUE: Multiplanar, multiecho pulse sequences of the brain and surrounding structures were obtained without intravenous contrast. Angiographic images of the head were obtained using MRA technique without contrast. COMPARISON:  CT head 05/10/2020 FINDINGS: MRI HEAD FINDINGS Brain:  Acute infarct in the right insular cortex and right parietal operculum. There is FLAIR hyperintensity in the right MCA branches in this area due to slow flow or thrombosis. There is susceptibility in the right sylvian fissure compatible with thrombus in the right MCA branch. Mild atrophy. Mild chronic microvascular ischemic change in the white matter. 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 Negative for mass lesion. Vascular: Normal arterial flow voids at the base of brain. Susceptibility in the right sylvian fissure with FLAIR hyperintensity in the right MCA vessels compatible with slow flow or occlusion Skull and upper cervical spine: Negative Sinuses/Orbits: Mild mucosal edema paranasal sinuses. Bilateral cataract extraction Other: None MRA HEAD FINDINGS Internal carotid artery widely patent bilaterally without stenosis. Anterior cerebral arteries widely patent with mild irregularity. Left middle cerebral artery widely patent with mild irregularity. Right M1 segment patent. Occlusion of the superior division of the right M3 vessel. Inferior division right M2 widely patent. Posterior circulation normal without significant stenosis. Fetal origin right posterior cerebral artery. IMPRESSION: 1. 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 2. Mild chronic microvascular ischemic change in the white matter 3. 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   On: 05/10/2020 13:52   CT CEREBRAL PERFUSION W CONTRAST  Addendum Date: 05/10/2020   ADDENDUM REPORT: 05/10/2020 15:20 ADDENDUM: These results were called by telephone at the time of interpretation on 05/10/2020 at 3:20 pm to provider [facility] , who verbally acknowledged these results. Electronically Signed   By: Franchot Gallo M.D.   On: 05/10/2020 15:20   Result Date: 05/10/2020 CLINICAL DATA:  Stroke. EXAM: CT ANGIOGRAPHY HEAD AND NECK CT PERFUSION BRAIN TECHNIQUE: Multidetector CT  imaging of the head and neck was performed using the standard protocol during bolus administration of intravenous contrast. Multiplanar CT image reconstructions and MIPs were obtained to evaluate the vascular anatomy. Carotid stenosis measurements (when applicable) are obtained utilizing NASCET criteria, using the distal internal carotid diameter as the denominator. Multiphase CT imaging of the brain was performed following IV bolus contrast injection. Subsequent parametric perfusion maps were calculated using RAPID software. CONTRAST:  18mL OMNIPAQUE IOHEXOL 350 MG/ML SOLN COMPARISON:  CT head 05/10/2020.  MRI and MRA head 05/10/2020 FINDINGS: CTA NECK FINDINGS Aortic arch: Standard branching. Imaged portion shows no evidence of aneurysm or dissection. No significant stenosis of the major arch vessel origins. Right carotid system: Mild atherosclerotic calcification right carotid bifurcation without significant stenosis. No irregularity or thrombus in the right carotid. Left carotid system: Mild atherosclerotic disease left carotid without stenosis or irregularity. Vertebral arteries: Both vertebral arteries widely patent. Skeleton: Disc degeneration and spurring. Foraminal narrowing due to spurring bilaterally at C5-6 and C6-7. No acute skeletal abnormality. Other neck: No mass or edema identified. Upper chest: Lung apices clear bilaterally. Review of the MIP images confirms the above findings CTA HEAD FINDINGS Anterior circulation: Cavernous carotid widely patent bilaterally. Anterior cerebral arteries widely patent. Left middle cerebral artery widely patent. Right M1 segment widely patent. There is occlusion of a large right M3 branch superior division right MCA as noted on prior MRA. This appears acute. Posterior circulation: Normal posterior circulation. No stenosis or large vessel occlusion. Venous sinuses: Normal venous enhancement. Anatomic variants: None Review of the MIP images confirms the above findings  CT Brain Perfusion Findings: ASPECTS: 10 CBF (<30%) Volume: 69mL Perfusion (Tmax>6.0s) volume: 42mL Mismatch Volume: 10mL Infarction Location:Right insula and right operculum. IMPRESSION: 1. Abnormal CT perfusion. 29 mm core  infarct with additional 33 mL of surrounding penumbra in the right insula and operculum corresponding to restricted diffusion on MRI. 2. Occluded right superior branch of M3 which is a relatively large vessel compatible with an embolus. 3. No significant carotid or vertebral artery stenosis in the neck. Electronically Signed: By: Franchot Gallo M.D. On: 05/10/2020 15:15   CT ANGIO HEAD CODE STROKE  Addendum Date: 05/10/2020   ADDENDUM REPORT: 05/10/2020 15:20 ADDENDUM: These results were called by telephone at the time of interpretation on 05/10/2020 at 3:20 pm to provider [facility] , who verbally acknowledged these results. Electronically Signed   By: Franchot Gallo M.D.   On: 05/10/2020 15:20   Result Date: 05/10/2020 CLINICAL DATA:  Stroke. EXAM: CT ANGIOGRAPHY HEAD AND NECK CT PERFUSION BRAIN TECHNIQUE: Multidetector CT imaging of the head and neck was performed using the standard protocol during bolus administration of intravenous contrast. Multiplanar CT image reconstructions and MIPs were obtained to evaluate the vascular anatomy. Carotid stenosis measurements (when applicable) are obtained utilizing NASCET criteria, using the distal internal carotid diameter as the denominator. Multiphase CT imaging of the brain was performed following IV bolus contrast injection. Subsequent parametric perfusion maps were calculated using RAPID software. CONTRAST:  154mL OMNIPAQUE IOHEXOL 350 MG/ML SOLN COMPARISON:  CT head 05/10/2020.  MRI and MRA head 05/10/2020 FINDINGS: CTA NECK FINDINGS Aortic arch: Standard branching. Imaged portion shows no evidence of aneurysm or dissection. No significant stenosis of the major arch vessel origins. Right carotid system: Mild atherosclerotic  calcification right carotid bifurcation without significant stenosis. No irregularity or thrombus in the right carotid. Left carotid system: Mild atherosclerotic disease left carotid without stenosis or irregularity. Vertebral arteries: Both vertebral arteries widely patent. Skeleton: Disc degeneration and spurring. Foraminal narrowing due to spurring bilaterally at C5-6 and C6-7. No acute skeletal abnormality. Other neck: No mass or edema identified. Upper chest: Lung apices clear bilaterally. Review of the MIP images confirms the above findings CTA HEAD FINDINGS Anterior circulation: Cavernous carotid widely patent bilaterally. Anterior cerebral arteries widely patent. Left middle cerebral artery widely patent. Right M1 segment widely patent. There is occlusion of a large right M3 branch superior division right MCA as noted on prior MRA. This appears acute. Posterior circulation: Normal posterior circulation. No stenosis or large vessel occlusion. Venous sinuses: Normal venous enhancement. Anatomic variants: None Review of the MIP images confirms the above findings CT Brain Perfusion Findings: ASPECTS: 10 CBF (<30%) Volume: 50mL Perfusion (Tmax>6.0s) volume: 62mL Mismatch Volume: 33mL Infarction Location:Right insula and right operculum. IMPRESSION: 1. Abnormal CT perfusion. 29 mm core infarct with additional 33 mL of surrounding penumbra in the right insula and operculum corresponding to restricted diffusion on MRI. 2. Occluded right superior branch of M3 which is a relatively large vessel compatible with an embolus. 3. No significant carotid or vertebral artery stenosis in the neck. Electronically Signed: By: Franchot Gallo M.D. On: 05/10/2020 15:15   CT ANGIO NECK CODE STROKE  Addendum Date: 05/10/2020   ADDENDUM REPORT: 05/10/2020 15:20 ADDENDUM: These results were called by telephone at the time of interpretation on 05/10/2020 at 3:20 pm to provider [facility] , who verbally acknowledged these  results. Electronically Signed   By: Franchot Gallo M.D.   On: 05/10/2020 15:20   Result Date: 05/10/2020 CLINICAL DATA:  Stroke. EXAM: CT ANGIOGRAPHY HEAD AND NECK CT PERFUSION BRAIN TECHNIQUE: Multidetector CT imaging of the head and neck was performed using the standard protocol during bolus administration of intravenous contrast. Multiplanar CT image  reconstructions and MIPs were obtained to evaluate the vascular anatomy. Carotid stenosis measurements (when applicable) are obtained utilizing NASCET criteria, using the distal internal carotid diameter as the denominator. Multiphase CT imaging of the brain was performed following IV bolus contrast injection. Subsequent parametric perfusion maps were calculated using RAPID software. CONTRAST:  122mL OMNIPAQUE IOHEXOL 350 MG/ML SOLN COMPARISON:  CT head 05/10/2020.  MRI and MRA head 05/10/2020 FINDINGS: CTA NECK FINDINGS Aortic arch: Standard branching. Imaged portion shows no evidence of aneurysm or dissection. No significant stenosis of the major arch vessel origins. Right carotid system: Mild atherosclerotic calcification right carotid bifurcation without significant stenosis. No irregularity or thrombus in the right carotid. Left carotid system: Mild atherosclerotic disease left carotid without stenosis or irregularity. Vertebral arteries: Both vertebral arteries widely patent. Skeleton: Disc degeneration and spurring. Foraminal narrowing due to spurring bilaterally at C5-6 and C6-7. No acute skeletal abnormality. Other neck: No mass or edema identified. Upper chest: Lung apices clear bilaterally. Review of the MIP images confirms the above findings CTA HEAD FINDINGS Anterior circulation: Cavernous carotid widely patent bilaterally. Anterior cerebral arteries widely patent. Left middle cerebral artery widely patent. Right M1 segment widely patent. There is occlusion of a large right M3 branch superior division right MCA as noted on prior MRA. This appears  acute. Posterior circulation: Normal posterior circulation. No stenosis or large vessel occlusion. Venous sinuses: Normal venous enhancement. Anatomic variants: None Review of the MIP images confirms the above findings CT Brain Perfusion Findings: ASPECTS: 10 CBF (<30%) Volume: 44mL Perfusion (Tmax>6.0s) volume: 29mL Mismatch Volume: 89mL Infarction Location:Right insula and right operculum. IMPRESSION: 1. Abnormal CT perfusion. 29 mm core infarct with additional 33 mL of surrounding penumbra in the right insula and operculum corresponding to restricted diffusion on MRI. 2. Occluded right superior branch of M3 which is a relatively large vessel compatible with an embolus. 3. No significant carotid or vertebral artery stenosis in the neck. Electronically Signed: By: Franchot Gallo M.D. On: 05/10/2020 15:15      Discharge Exam: Vitals:   05/10/20 1345 05/10/20 1530  BP: (!) 117/59 132/69  Pulse: 84 82  Resp: 18 18  Temp: 98 F (36.7 C) (!) 97.5 F (36.4 C)  SpO2: 95% 98%   Vitals:   05/10/20 1255 05/10/20 1345 05/10/20 1500 05/10/20 1530  BP: (!) 150/77 (!) 117/59  132/69  Pulse: 90 84  82  Resp:  18  18  Temp: 98 F (36.7 C) 98 F (36.7 C)  (!) 97.5 F (36.4 C)  TempSrc: Oral     SpO2: 96% 95%  98%  Weight:   99.9 kg   Height:        General: Not in acute distress HEENT:       Eyes: PERRL, EOMI, no scleral icterus.       ENT: No discharge from the ears and nose, no pharynx injection, no tonsillar enlargement.        Neck: No JVD, no bruit, no mass felt. Heme: No neck lymph node enlargement. Cardiac: S1/S2, RRR, No murmurs, No gallops or rubs. Respiratory: No rales, wheezing, rhonchi or rubs. GI: Soft, nondistended, nontender, no rebound pain, no organomegaly, BS present. GU: No hematuria Ext: No pitting leg edema bilaterally. 1+DP/PT pulse bilaterally. Musculoskeletal: No joint deformities, No joint redness or warmth, no limitation of ROM in spin. Skin: No rashes.  Neuro:  Alert, oriented X3, cranial nerves II-XII grossly intact except for left facial droop, left-sided weakness and numbness Psych: Patient is not  psychotic, no suicidal or hemocidal ideation.   The results of significant diagnostics from this hospitalization (including imaging, microbiology, ancillary and laboratory) are listed below for reference.     Microbiology: Recent Results (from the past 240 hour(s))  Resp Panel by RT-PCR (Flu A&B, Covid) Nasopharyngeal Swab     Status: None   Collection Time: 05/10/20  9:35 AM   Specimen: Nasopharyngeal Swab; Nasopharyngeal(NP) swabs in vial transport medium  Result Value Ref Range Status   SARS Coronavirus 2 by RT PCR NEGATIVE NEGATIVE Final    Comment: (NOTE) SARS-CoV-2 target nucleic acids are NOT DETECTED.  The SARS-CoV-2 RNA is generally detectable in upper respiratory specimens during the acute phase of infection. The lowest concentration of SARS-CoV-2 viral copies this assay can detect is 138 copies/mL. A negative result does not preclude SARS-Cov-2 infection and should not be used as the sole basis for treatment or other patient management decisions. A negative result may occur with  improper specimen collection/handling, submission of specimen other than nasopharyngeal swab, presence of viral mutation(s) within the areas targeted by this assay, and inadequate number of viral copies(<138 copies/mL). A negative result must be combined with clinical observations, patient history, and epidemiological information. The expected result is Negative.  Fact Sheet for Patients:  EntrepreneurPulse.com.au  Fact Sheet for Healthcare Providers:  IncredibleEmployment.be  This test is no t yet approved or cleared by the [place] FDA and  has been authorized for detection and/or diagnosis of SARS-CoV-2 by FDA under an Emergency Use Authorization (EUA). This EUA will remain  in effect (meaning this test can be  used) for the duration of the COVID-19 declaration under Section 564(b)(1) of the Act, 21 U.S.C.section 360bbb-3(b)(1), unless the authorization is terminated  or revoked sooner.       Influenza A by PCR NEGATIVE NEGATIVE Final   Influenza B by PCR NEGATIVE NEGATIVE Final    Comment: (NOTE) The Xpert Xpress SARS-CoV-2/FLU/RSV plus assay is intended as an aid in the diagnosis of influenza from Nasopharyngeal swab specimens and should not be used as a sole basis for treatment. Nasal washings and aspirates are unacceptable for Xpert Xpress SARS-CoV-2/FLU/RSV testing.  Fact Sheet for Patients: EntrepreneurPulse.com.au  Fact Sheet for Healthcare Providers: IncredibleEmployment.be  This test is not yet approved or cleared by the [place] FDA and has been authorized for detection and/or diagnosis of SARS-CoV-2 by FDA under an Emergency Use Authorization (EUA). This EUA will remain in effect (meaning this test can be used) for the duration of the COVID-19 declaration under Section 564(b)(1) of the Act, 21 U.S.C. section 360bbb-3(b)(1), unless the authorization is terminated or revoked.  Performed at [facility], [address] [place], [place] [address]      Labs: BNP (last 3 results) No results for input(s): BNP in the last 8760 hours. Basic Metabolic Panel: Recent Labs  Lab 05/10/20 0940  NA 139  K 3.7  CL 103  CO2 26  GLUCOSE 207*  BUN 31*  CREATININE 2.26*  CALCIUM 9.7   Liver Function Tests: Recent Labs  Lab 05/10/20 0940  AST 20  ALT 16  ALKPHOS 45  BILITOT 0.8  PROT 7.2  ALBUMIN 4.1   No results for input(s): LIPASE, AMYLASE in the last 168 hours. No results for input(s): AMMONIA in the last 168 hours. CBC: Recent Labs  Lab 05/10/20 0940  WBC 9.0  NEUTROABS 6.5  HGB 14.6  HCT 42.6  MCV 92.2  PLT 270   Cardiac Enzymes: No results for  input(s): CKTOTAL, CKMB, CKMBINDEX, TROPONINI in the last  168 hours. BNP: Invalid input(s): POCBNP CBG: Recent Labs  Lab 05/10/20 1432  GLUCAP 291*   D-Dimer No results for input(s): DDIMER in the last 72 hours. Hgb A1c No results for input(s): HGBA1C in the last 72 hours. Lipid Profile Recent Labs    05/10/20 0940  CHOL 181  HDL 34*  LDLCALC 99  TRIG 238*  CHOLHDL 5.3   Thyroid function studies No results for input(s): TSH, T4TOTAL, T3FREE, THYROIDAB in the last 72 hours.  Invalid input(s): FREET3 Anemia work up No results for input(s): VITAMINB12, FOLATE, FERRITIN, TIBC, IRON, RETICCTPCT in the last 72 hours. Urinalysis No results found for: COLORURINE, APPEARANCEUR, [place], [place], GLUCOSEU, [place], [place], [place], PROTEINUR, UROBILINOGEN, NITRITE, LEUKOCYTESUR Sepsis Labs Invalid input(s): PROCALCITONIN,  WBC,  LACTICIDVEN Microbiology Recent Results (from the past 240 hour(s))  Resp Panel by RT-PCR (Flu A&B, Covid) Nasopharyngeal Swab     Status: None   Collection Time: 05/10/20  9:35 AM   Specimen: Nasopharyngeal Swab; Nasopharyngeal(NP) swabs in vial transport medium  Result Value Ref Range Status   SARS Coronavirus 2 by RT PCR NEGATIVE NEGATIVE Final    Comment: (NOTE) SARS-CoV-2 target nucleic acids are NOT DETECTED.  The SARS-CoV-2 RNA is generally detectable in upper respiratory specimens during the acute phase of infection. The lowest concentration of SARS-CoV-2 viral copies this assay can detect is 138 copies/mL. A negative result does not preclude SARS-Cov-2 infection and should not be used as the sole basis for treatment or other patient management decisions. A negative result may occur with  improper specimen collection/handling, submission of specimen other than nasopharyngeal swab, presence of viral mutation(s) within the areas targeted by this assay, and inadequate number of viral copies(<138 copies/mL). A negative result must be combined with clinical observations, patient history, and  epidemiological information. The expected result is Negative.  Fact Sheet for Patients:  EntrepreneurPulse.com.au  Fact Sheet for Healthcare Providers:  IncredibleEmployment.be  This test is no t yet approved or cleared by the [place] FDA and  has been authorized for detection and/or diagnosis of SARS-CoV-2 by FDA under an Emergency Use Authorization (EUA). This EUA will remain  in effect (meaning this test can be used) for the duration of the COVID-19 declaration under Section 564(b)(1) of the Act, 21 U.S.C.section 360bbb-3(b)(1), unless the authorization is terminated  or revoked sooner.       Influenza A by PCR NEGATIVE NEGATIVE Final   Influenza B by PCR NEGATIVE NEGATIVE Final    Comment: (NOTE) The Xpert Xpress SARS-CoV-2/FLU/RSV plus assay is intended as an aid in the diagnosis of influenza from Nasopharyngeal swab specimens and should not be used as a sole basis for treatment. Nasal washings and aspirates are unacceptable for Xpert Xpress SARS-CoV-2/FLU/RSV testing.  Fact Sheet for Patients: EntrepreneurPulse.com.au  Fact Sheet for Healthcare Providers: IncredibleEmployment.be  This test is not yet approved or cleared by the [place] FDA and has been authorized for detection and/or diagnosis of SARS-CoV-2 by FDA under an Emergency Use Authorization (EUA). This EUA will remain in effect (meaning this test can be used) for the duration of the COVID-19 declaration under Section 564(b)(1) of the Act, 21 U.S.C. section 360bbb-3(b)(1), unless the authorization is terminated or revoked.  Performed at [facility], [address] [place], [place] [address]     Time coordinating discharge:  35 minutes.  SIGNED:  Ivor Costa, MD [facility] 05/10/2020, 3:52 PM   If 7PM-7AM, please contact night-coverage  [email or web address]

## 2020-05-10 NOTE — ED Provider Notes (Signed)
[facility] Emergency Department Provider Note    Event Date/Time   First MD Initiated Contact with Patient 05/10/20 [phone]     (approximate)  I have reviewed the triage vital signs and the nursing notes.   HISTORY  Chief Complaint Aphasia and Gait Problem    HPI Thomas Sampson is a 75 y.o. male with the below listed past medical history presents to the ER for evaluation of left-sided weakness facial droop and slurred speech that was present when the patient woke up and got out of bed this morning.  He noticed as he tried to get himself up and fell onto the floor.  Went to bed feeling normal last night.  No history of TIA or CVA.  Denies any chest pain or pressure.  Was reportedly slurring of speech and having trouble grabbing things with his left hand and the patient is left-hand dominant.    Past Medical History:  Diagnosis Date  . Chronic kidney disease    kidney stones  . Diabetes mellitus without complication ([place])   . Hyperlipidemia   . Hypertension   . Tinea pedis    Family History  Problem Relation Age of Onset  . Cancer Mother   . Heart attack Father   . Heart attack Sister   . Heart disease Sister   . Diabetes Brother    Past Surgical History:  Procedure Laterality Date  . COLON SURGERY    . COLONOSCOPY WITH PROPOFOL N/A 09/18/2016   Procedure: COLONOSCOPY WITH PROPOFOL;  Surgeon: Lollie Sails, MD;  Location: [facility] ENDOSCOPY;  Service: Endoscopy;  Laterality: N/A;  . EYE SURGERY     cataract extraction   Patient Active Problem List   Diagnosis Date Noted  . Stroke ([place]) 05/10/2020  . CKD (chronic kidney disease), stage IV ([place]) 05/10/2020  . Fall 05/10/2020  . GERD (gastroesophageal reflux disease) 05/10/2020  . Hyperlipidemia 10/15/2018  . Type II diabetes mellitus with renal manifestations ([place]) 09/21/2017  . Hypertension 09/21/2017  . CKD (chronic kidney disease) stage 3, GFR 30-59 ml/min ([facility]) 09/21/2017  .  Claudication ([place]) 09/21/2017  . Left flank pain 11/08/2016  . Porokeratosis 07/23/2015  . Bursitis of hip, right 01/15/2015      Prior to Admission medications   Medication Sig Start Date End Date Taking? Authorizing Provider  Alpha-Lipoic Acid 600 MG TABS Take by mouth.   Yes [provider]  amLODipine (NORVASC) 2.5 MG tablet Take 2.5 mg by mouth daily.   Yes [provider]  aspirin EC 81 MG tablet Take by mouth.   Yes [provider]  calcitRIOL (ROCALTROL) 0.25 MCG capsule Take 0.25 mcg by mouth daily. 04/26/20  Yes [provider]  Cholecalciferol 2000 units CAPS Take 2,000 Units by mouth daily.   Yes [provider]  colestipol (COLESTID) 1 g tablet Take 1 g by mouth 2 (two) times daily. 04/08/20  Yes [provider]  cyanocobalamin 1000 MCG tablet Take 1,000 mcg by mouth daily.   Yes [provider]  glipiZIDE (GLUCOTROL XL) 10 MG 24 hr tablet Take 10 mg by mouth at bedtime.   Yes [provider]  hydrochlorothiazide (HYDRODIURIL) 12.5 MG tablet Take 12.5 mg by mouth daily.   Yes [provider]  lisinopril (PRINIVIL,ZESTRIL) 40 MG tablet Take 40 mg by mouth daily. Take 20 mg twice a day   Yes [provider]  metoprolol succinate (TOPROL-XL) 100 MG 24 hr tablet Take 100 mg by mouth  daily. Take with or immediately following a meal.   Yes [provider]  pantoprazole (PROTONIX) 40 MG tablet Take 40 mg by mouth daily.   Yes [provider]  pregabalin (LYRICA) 75 MG capsule Take 75 mg by mouth 2 (two) times daily. 05/05/20  Yes [provider]  cholestyramine Lucrezia Starch) 4 GM/DOSE powder Take by mouth 2 (two) times daily with a meal. Patient not taking: No sig reported    [provider]  cyanocobalamin (,VITAMIN B-12,) 1000 MCG/ML injection Inject 99ml IM once very 4 weeks then once a month for 4 months. Patient not taking: No sig reported 09/27/18   [provider]  gabapentin (NEURONTIN) 300 MG capsule Take by mouth 3 (three) times daily.  08/03/17 10/15/18  [provider]  glucose blood (ONETOUCH ULTRA) test strip Use 2 (two) times daily Dx E11.9 07/26/18   [provider]  insulin aspart (NOVOLOG) 100 UNIT/ML injection Inject 26 Units into the skin AC breakfast. take 26 units before breakfast and 22 units before supper Patient not taking: No sig reported    [provider]  insulin aspart protamine - aspart (NOVOLOG 70/30 MIX) (70-30) 100 UNIT/ML FlexPen 42-52 Units. 42 units qam and 52 qhs 05/01/17   [provider]  Insulin Pen Needle (BD PEN NEEDLE NANO U/F) 32G X 4 MM MISC USE WITH INJECTIONS 2 TIMES DAILY 07/26/18   [provider]  metFORMIN (GLUCOPHAGE-XR) 500 MG 24 hr tablet Take 1,000 mg by mouth 2 (two) times daily. Patient not taking: No sig reported    [provider]    Allergies Demerol [meperidine]    Social History Social History   Tobacco Use  . Smoking status: Former Research scientist (life sciences)  . Smokeless tobacco: Never Used  Vaping Use  . Vaping Use: Never used  Substance Use Topics  . Alcohol use: No  . Drug use: No    Review of Systems Patient denies headaches, rhinorrhea, blurry vision, numbness, shortness of breath, chest pain, edema, cough, abdominal pain, nausea, vomiting, diarrhea, dysuria, fevers, rashes or hallucinations unless otherwise stated above in HPI. ____________________________________________   PHYSICAL EXAM:  VITAL SIGNS: Vitals:   05/10/20 1255 05/10/20 1345  BP: (!) 150/77 (!) 117/59  Pulse: 90 84  Resp:  18  Temp: 98 F (36.7 C) 98 F (36.7 C)  SpO2: 96% 95%    Constitutional: Alert and oriented.  Eyes: Conjunctivae are normal.  Head: Atraumatic. Nose: No congestion/rhinnorhea. Mouth/Throat: Mucous membranes are moist.   Neck: No stridor. Painless ROM.  Cardiovascular: Normal rate, regular rhythm. Grossly normal heart sounds.  Good  peripheral circulation. Respiratory: Normal respiratory effort.  No retractions. Lungs CTAB. Gastrointestinal: Soft and nontender. No distention. No abdominal bruits. No CVA tenderness. Genitourinary:  Musculoskeletal: No lower extremity tenderness nor edema.  No joint effusions. Neurologic:  CN- intact.  No facial droop, slightly amount of dysmetria with left FNF.  Right norml.  Normal heel to shin.  Sensation intact bilaterally. Normal speech and language. Motor intact and equal throughout. No gross focal neurologic deficits are appreciated. No gait instability.  Skin:  Skin is warm, dry and intact. No rash noted. Psychiatric: Mood and affect are normal. Speech and behavior are normal.  ____________________________________________   LABS (all labs ordered are listed, but only abnormal results are displayed)  Results for orders placed or performed during the hospital encounter of 05/10/20 (from the past 24 hour(s))  Resp Panel by RT-PCR (Flu A&B, Covid) Nasopharyngeal Swab  Status: None   Collection Time: 05/10/20  9:35 AM   Specimen: Nasopharyngeal Swab; Nasopharyngeal(NP) swabs in vial transport medium  Result Value Ref Range   SARS Coronavirus 2 by RT PCR NEGATIVE NEGATIVE   Influenza A by PCR NEGATIVE NEGATIVE   Influenza B by PCR NEGATIVE NEGATIVE  Protime-INR     Status: None   Collection Time: 05/10/20  9:40 AM  Result Value Ref Range   Prothrombin Time 12.3 11.4 - 15.2 seconds   INR 1.0 0.8 - 1.2  APTT     Status: None   Collection Time: 05/10/20  9:40 AM  Result Value Ref Range   aPTT 24 24 - 36 seconds  CBC     Status: None   Collection Time: 05/10/20  9:40 AM  Result Value Ref Range   WBC 9.0 4.0 - 10.5 K/uL   RBC 4.62 4.22 - 5.81 MIL/uL   Hemoglobin 14.6 13.0 - 17.0 g/dL   HCT 42.6 39.0 - 52.0 %   MCV 92.2 80.0 - 100.0 fL   MCH 31.6 26.0 - 34.0 pg   MCHC 34.3 30.0 - 36.0 g/dL   RDW 13.0 11.5 - 15.5 %   Platelets 270 150 - 400 K/uL   nRBC 0.0 0.0 - 0.2 %   Differential     Status: Abnormal   Collection Time: 05/10/20  9:40 AM  Result Value Ref Range   Neutrophils Relative % 70 %   Neutro Abs 6.5 1.7 - 7.7 K/uL   Lymphocytes Relative 17 %   Lymphs Abs 1.5 0.7 - 4.0 K/uL   Monocytes Relative 8 %   Monocytes Absolute 0.7 0.1 - 1.0 K/uL   Eosinophils Relative 2 %   Eosinophils Absolute 0.2 0.0 - 0.5 K/uL   Basophils Relative 1 %   Basophils Absolute 0.1 0.0 - 0.1 K/uL   Immature Granulocytes 2 %   Abs Immature Granulocytes 0.14 (H) 0.00 - 0.07 K/uL  Comprehensive metabolic panel     Status: Abnormal   Collection Time: 05/10/20  9:40 AM  Result Value Ref Range   Sodium 139 135 - 145 mmol/L   Potassium 3.7 3.5 - 5.1 mmol/L   Chloride 103 98 - 111 mmol/L   CO2 26 22 - 32 mmol/L   Glucose, Bld 207 (H) 70 - 99 mg/dL   BUN 31 (H) 8 - 23 mg/dL   Creatinine, Ser 2.26 (H) 0.61 - 1.24 mg/dL   Calcium 9.7 8.9 - 10.3 mg/dL   Total Protein 7.2 6.5 - 8.1 g/dL   Albumin 4.1 3.5 - 5.0 g/dL   AST 20 15 - 41 U/L   ALT 16 0 - 44 U/L   Alkaline Phosphatase 45 38 - 126 U/L   Total Bilirubin 0.8 0.3 - 1.2 mg/dL   GFR, Estimated 30 (L) >60 mL/min   Anion gap 10 5 - 15  Lipid panel     Status: Abnormal   Collection Time: 05/10/20  9:40 AM  Result Value Ref Range   Cholesterol 181 0 - 200 mg/dL   Triglycerides 238 (H) <150 mg/dL   HDL 34 (L) >40 mg/dL   Total CHOL/HDL Ratio 5.3 RATIO   VLDL 48 (H) 0 - 40 mg/dL   LDL Cholesterol 99 0 - 99 mg/dL  Glucose, capillary     Status: Abnormal   Collection Time: 05/10/20  2:32 PM  Result Value Ref Range   Glucose-Capillary 291 (H) 70 - 99 mg/dL   ____________________________________________  EKG My review  and personal interpretation at Time: 9:12   Indication: weakness  Rate: 90  Rhythm: sinus Axis: normal Other: normal intervals, no stemi, low voltage ____________________________________________  RADIOLOGY  I personally reviewed all radiographic images ordered to evaluate for the above acute  complaints and reviewed radiology reports and findings.  These findings were personally discussed with the patient.  Please see medical record for radiology report.  ____________________________________________   PROCEDURES  Procedure(s) performed:  Procedures    Critical Care performed: no ____________________________________________   INITIAL IMPRESSION / ASSESSMENT AND PLAN / ED COURSE  Pertinent labs & imaging results that were available during my care of the patient were reviewed by me and considered in my medical decision making (see chart for details).   DDX: cva, tia, hypoglycemia, dehydration, electrolyte abnormality, dissection, sepsis   Thomas Sampson is a 75 y.o. who presents to the ED with Hayden Pedro as described above.  Patient outside the window for TPA with presentation concerning for TIA versus CVA.  CT imaging will be ordered for above differential.  Blood work sent for by differential  The patient will be placed on continuous pulse oximetry and telemetry for monitoring.  Laboratory evaluation will be sent to evaluate for the above complaints.     Clinical Course as of 05/10/20 1439  Mon May 10, 2020  1048 Patient reassessed.  Discussed case in consultation with neurology who kindly agrees to see patient at bedside will order MRI/MRA.  Will discuss with hospitalist for admission. [PR]    Clinical Course User Index [PR] Merlyn Lot, MD    The patient was evaluated in Emergency Department today for the symptoms described in the history of present illness. He/she was evaluated in the context of the global COVID-19 pandemic, which necessitated consideration that the patient might be at risk for infection with the SARS-CoV-2 virus that causes COVID-19. Institutional protocols and algorithms that pertain to the evaluation of patients at risk for COVID-19 are in a state of rapid change based on information released by regulatory bodies including the CDC and federal and  state organizations. These policies and algorithms were followed during the patient's care in the ED.  As part of my medical decision making, I reviewed the following data within the [facility] notes reviewed and incorporated, Labs reviewed, notes from prior ED visits and [place] Controlled Substance Database   ____________________________________________   FINAL CLINICAL IMPRESSION(S) / ED DIAGNOSES  Final diagnoses:  TIA (transient ischemic attack)      NEW MEDICATIONS STARTED DURING THIS VISIT:  Current Discharge Medication List       Note:  This document was prepared using Dragon voice recognition software and may include unintentional dictation errors.    Merlyn Lot, MD 05/10/20 [phone]

## 2020-05-10 NOTE — Transfer of Care (Signed)
Immediate Anesthesia Transfer of Care Note  Patient: Thomas Sampson  Procedure(s) Performed: IR WITH ANESTHESIA (N/A )  Patient Location: ICU  Anesthesia Type:General  Level of Consciousness: Patient remains intubated per anesthesia plan  Airway & Oxygen Therapy: Patient remains intubated per anesthesia plan and Patient placed on Ventilator (see vital sign flow sheet for setting)  Post-op Assessment: Report given to RN and Post -op Vital signs reviewed and stable  Post vital signs: Reviewed and stable  Last Vitals:  Vitals Value Taken Time  BP 88/56 05/10/20 1828  Temp    Pulse 92 05/10/20 1834  Resp 16 05/10/20 1834  SpO2 91 % 05/10/20 1834  Vitals shown include unvalidated device data.  Last Pain: There were no vitals filed for this visit.       Complications: No complications documented.

## 2020-05-10 NOTE — Progress Notes (Signed)
eLink Physician-Brief Progress Note Patient Name: Thomas Sampson DOB: 1945-05-09 MRN: [id number]   Date of Service  05/10/2020  HPI/Events of Note  Patient with sub-optimal sedation and hypotension when Propofol infusion was started. PRN Fentanyl has worked but is required frequently.  eICU Interventions  Fentanyl infusion ordered.        [name] [name] 05/10/2020, 10:50 PM

## 2020-05-11 ENCOUNTER — Inpatient Hospital Stay (HOSPITAL_COMMUNITY): Payer: Medicare Other

## 2020-05-11 ENCOUNTER — Encounter (HOSPITAL_COMMUNITY): Payer: Self-pay | Admitting: Radiology

## 2020-05-11 DIAGNOSIS — Z978 Presence of other specified devices: Secondary | ICD-10-CM | POA: Diagnosis not present

## 2020-05-11 DIAGNOSIS — I6601 Occlusion and stenosis of right middle cerebral artery: Secondary | ICD-10-CM | POA: Diagnosis not present

## 2020-05-11 DIAGNOSIS — J9811 Atelectasis: Secondary | ICD-10-CM

## 2020-05-11 DIAGNOSIS — J9611 Chronic respiratory failure with hypoxia: Secondary | ICD-10-CM

## 2020-05-11 LAB — COMPREHENSIVE METABOLIC PANEL
ALT: 15 U/L (ref 0–44)
AST: 15 U/L (ref 15–41)
Albumin: 3.4 g/dL — ABNORMAL LOW (ref 3.5–5.0)
Alkaline Phosphatase: 47 U/L (ref 38–126)
Anion gap: 15 (ref 5–15)
BUN: 28 mg/dL — ABNORMAL HIGH (ref 8–23)
CO2: 21 mmol/L — ABNORMAL LOW (ref 22–32)
Calcium: 8.7 mg/dL — ABNORMAL LOW (ref 8.9–10.3)
Chloride: 102 mmol/L (ref 98–111)
Creatinine, Ser: 2.26 mg/dL — ABNORMAL HIGH (ref 0.61–1.24)
GFR, Estimated: 30 mL/min — ABNORMAL LOW (ref >60)
Glucose, Bld: 409 mg/dL — ABNORMAL HIGH (ref 70–99)
Potassium: 4.2 mmol/L (ref 3.5–5.1)
Sodium: 138 mmol/L (ref 135–145)
Total Bilirubin: 0.8 mg/dL (ref 0.3–1.2)
Total Protein: 6.1 g/dL — ABNORMAL LOW (ref 6.5–8.1)

## 2020-05-11 LAB — CBC WITH DIFFERENTIAL/PLATELET
Abs Immature Granulocytes: 0.12 10*3/uL — ABNORMAL HIGH (ref 0.00–0.07)
Basophils Absolute: 0 10*3/uL (ref 0.0–0.1)
Basophils Relative: 0 %
Eosinophils Absolute: 0 10*3/uL (ref 0.0–0.5)
Eosinophils Relative: 0 %
HCT: 38.8 % — ABNORMAL LOW (ref 39.0–52.0)
Hemoglobin: 12.7 g/dL — ABNORMAL LOW (ref 13.0–17.0)
Immature Granulocytes: 1 %
Lymphocytes Relative: 4 %
Lymphs Abs: 0.7 10*3/uL (ref 0.7–4.0)
MCH: 31.1 pg (ref 26.0–34.0)
MCHC: 32.7 g/dL (ref 30.0–36.0)
MCV: 95.1 fL (ref 80.0–100.0)
Monocytes Absolute: 1 10*3/uL (ref 0.1–1.0)
Monocytes Relative: 6 %
Neutro Abs: 15.3 10*3/uL — ABNORMAL HIGH (ref 1.7–7.7)
Neutrophils Relative %: 89 %
Platelets: 269 10*3/uL (ref 150–400)
RBC: 4.08 MIL/uL — ABNORMAL LOW (ref 4.22–5.81)
RDW: 13.3 % (ref 11.5–15.5)
WBC: 17.2 10*3/uL — ABNORMAL HIGH (ref 4.0–10.5)
nRBC: 0 % (ref 0.0–0.2)

## 2020-05-11 LAB — GLUCOSE, CAPILLARY
Glucose-Capillary: 390 mg/dL — ABNORMAL HIGH (ref 70–99)
Glucose-Capillary: 341 mg/dL — ABNORMAL HIGH (ref 70–99)
Glucose-Capillary: 338 mg/dL — ABNORMAL HIGH (ref 70–99)
Glucose-Capillary: 264 mg/dL — ABNORMAL HIGH (ref 70–99)
Glucose-Capillary: 227 mg/dL — ABNORMAL HIGH (ref 70–99)
Glucose-Capillary: 202 mg/dL — ABNORMAL HIGH (ref 70–99)

## 2020-05-11 LAB — HEPATIC FUNCTION PANEL
ALT: 11 U/L (ref 0–44)
AST: 15 U/L (ref 15–41)
Albumin: 3.5 g/dL (ref 3.5–5.0)
Alkaline Phosphatase: 46 U/L (ref 38–126)
Bilirubin, Direct: <0.1 mg/dL (ref 0.0–0.2)
Total Bilirubin: 0.7 mg/dL (ref 0.3–1.2)
Total Protein: 6.3 g/dL — ABNORMAL LOW (ref 6.5–8.1)

## 2020-05-11 LAB — LIPID PANEL
Cholesterol: 166 mg/dL (ref 0–200)
HDL: 33 mg/dL — ABNORMAL LOW (ref >40)
LDL Cholesterol: 91 mg/dL (ref 0–99)
Total CHOL/HDL Ratio: 5 RATIO
Triglycerides: 208 mg/dL — ABNORMAL HIGH (ref <150)
VLDL: 42 mg/dL — ABNORMAL HIGH (ref 0–40)

## 2020-05-11 LAB — HEMOGLOBIN A1C
Hgb A1c MFr Bld: 8.2 % — ABNORMAL HIGH (ref 4.8–5.6)
Mean Plasma Glucose: 188.64 mg/dL

## 2020-05-11 LAB — TRIGLYCERIDES: Triglycerides: 204 mg/dL — ABNORMAL HIGH (ref <150)

## 2020-05-11 LAB — MRSA PCR SCREENING: MRSA by PCR: NEGATIVE

## 2020-05-11 IMAGING — MR MR MRA HEAD W/O CM
1 series · 17 of 48 positions shown · non-contrast
Comparison: Head CT [DATE].

CLINICAL DATA: Stroke, follow-up.

EXAM:
MRI HEAD WITHOUT CONTRAST
MRA HEAD WITHOUT CONTRAST
TECHNIQUE: Multiplanar, multiecho pulse sequences of the brain and surrounding
structures were obtained without intravenous contrast. Angiographic
images of the head were obtained using MRA technique without
contrast.

[Series 10: 3d cow · axial · 0.5mm · 0.41mm/px · z∈[-38,+39]mm · 17 of 172 slices shown]
[im 1/172]
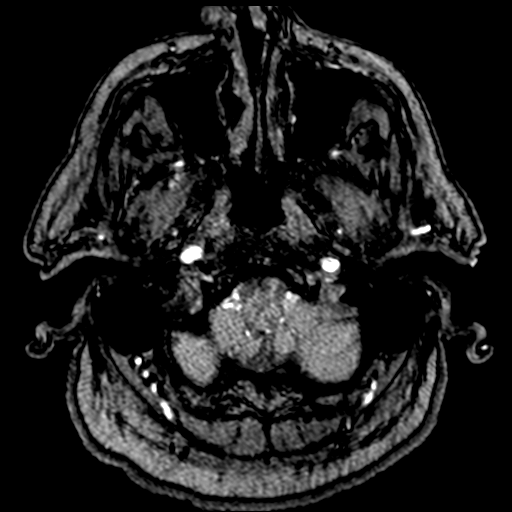
[im 4/172]
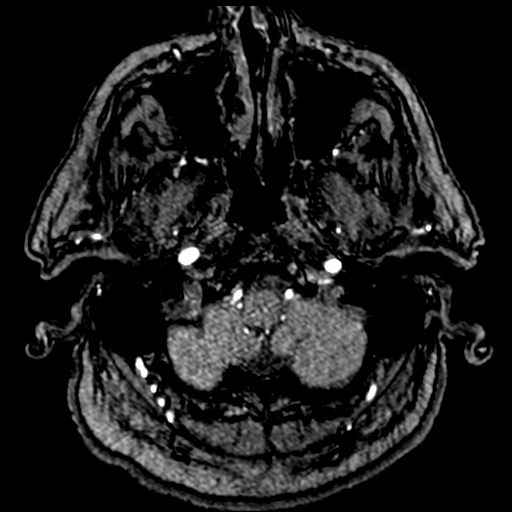
[im 8/172]
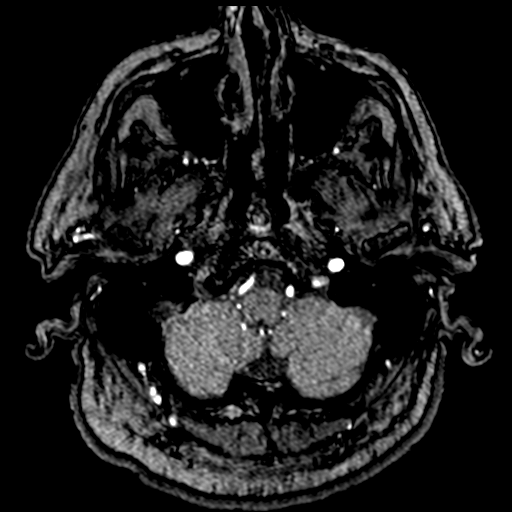
[im 11/172]
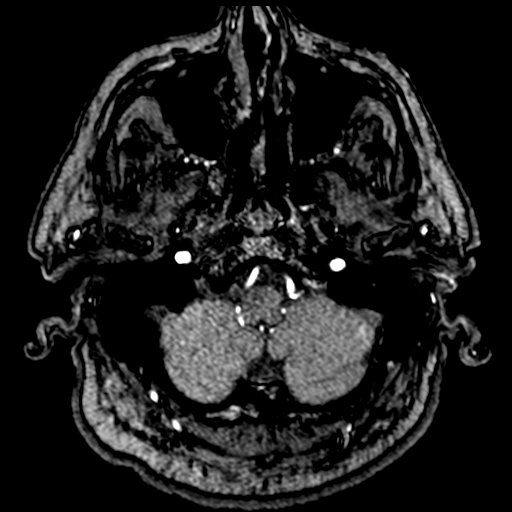
[im 15/172]
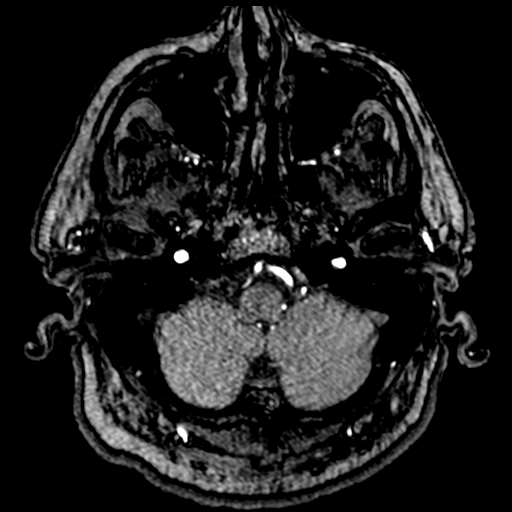
[im 19/172]
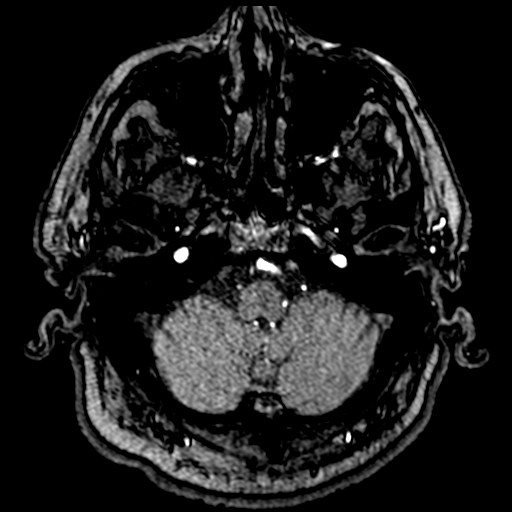
[im 22/172]
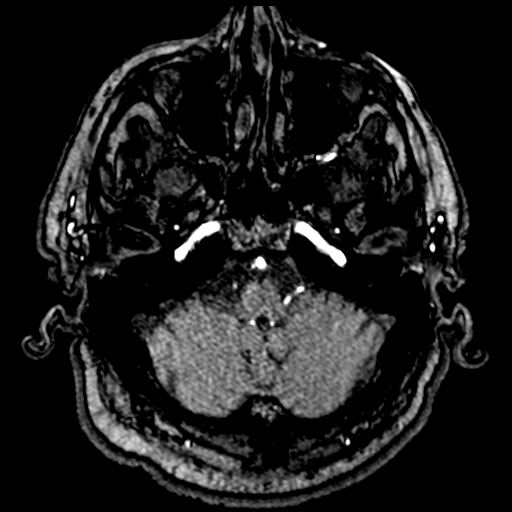
[im 30/172]
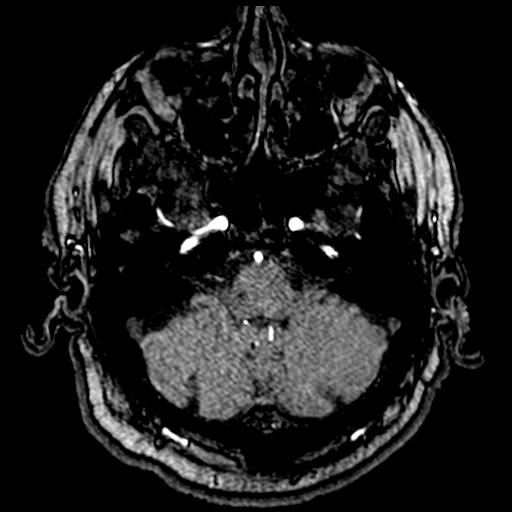
[im 33/172]
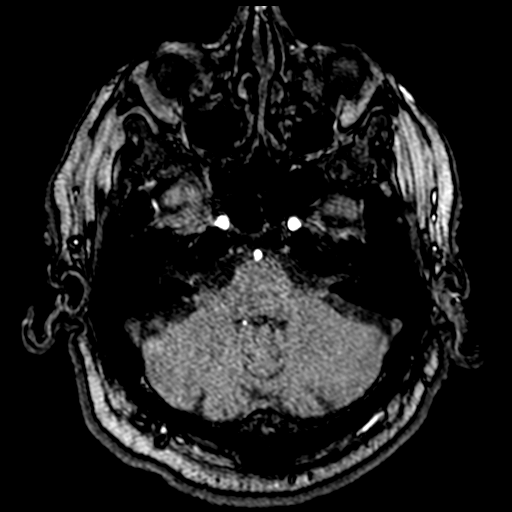
[im 55/172]
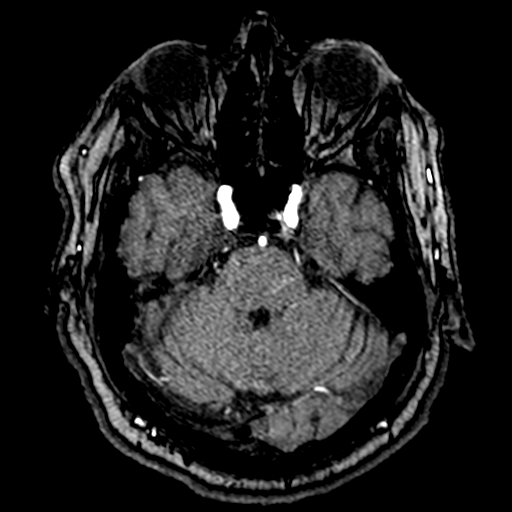
[im 77/172]
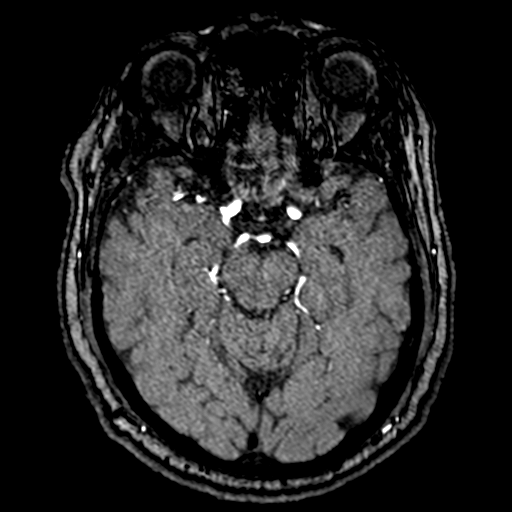
[im 88/172]
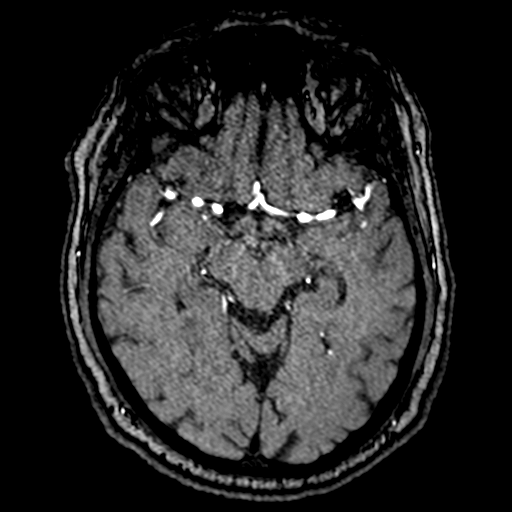
[im 99/172]
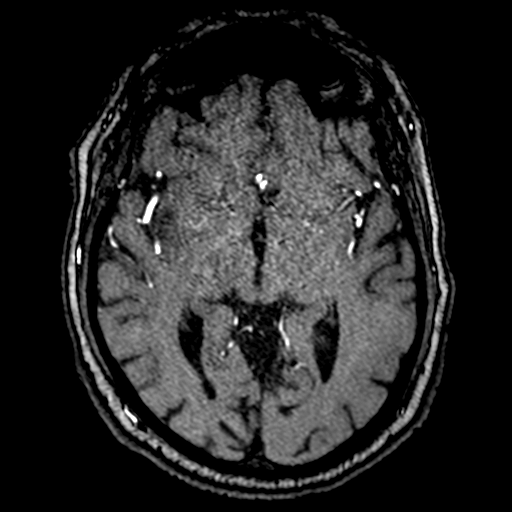
[im 121/172]
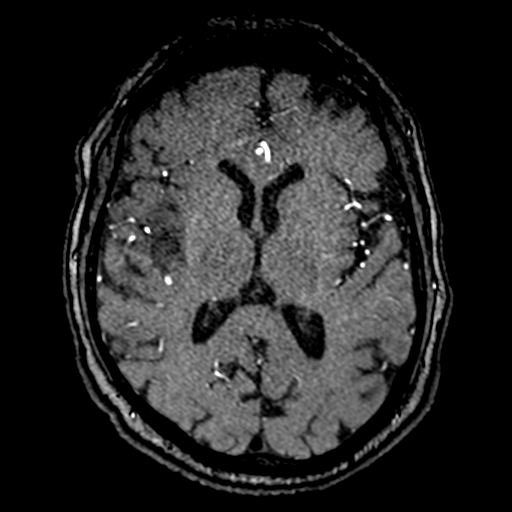
[im 142/172]
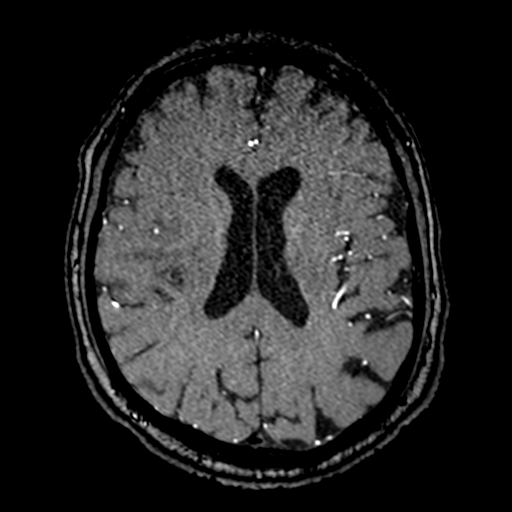
[im 146/172]
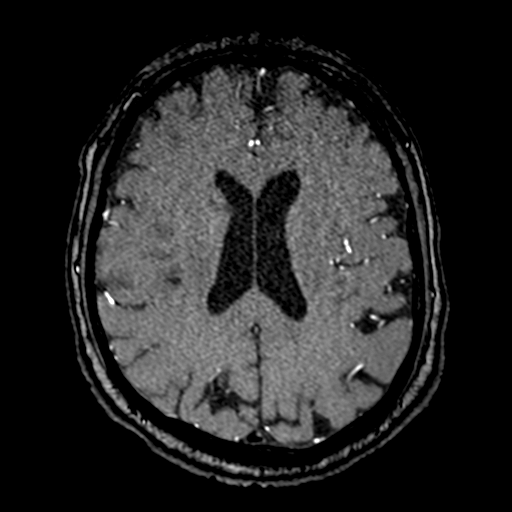
[im 164/172]
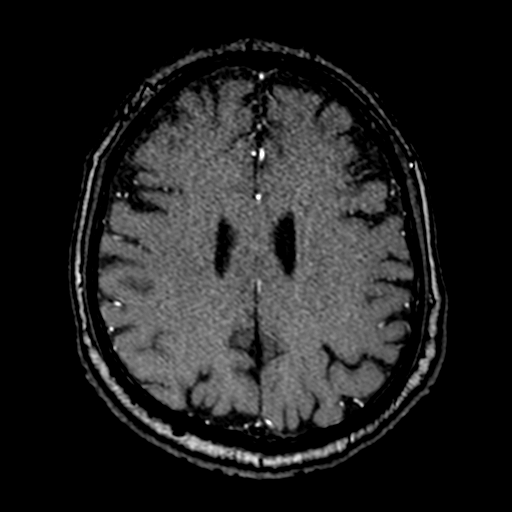

[17 of 48 positions shown; findings below may reference images not displayed]

Images from thrombectomy
[DATE]. Noncontrast head CT, CT angiogram head/neck and CT
perfusion [DATE]. MRI/MRA head [DATE]. Small chronic infarct
within the right cerebellar hemisphere.
FINDINGS: MRI HEAD FINDINGS

Brain:

Mild-to-moderate cerebral atrophy. Comparatively mild cerebellar
atrophy.

Acute cortical/subcortical infarct within the right MCA vascular
territory. The dominant component of the infarct is present within
the right frontal operculum and right insula and now measures 4.7 x
3.9 x 4.2 cm. However, there are additional cortical/subcortical
acute infarcts more superiorly and posteriorly within the right
frontal lobe and right parietal lobe (with involvement of the pre
and postcentral gyri). Additional small acute infarcts are also
present within the right caudate nucleus and right frontal lobe more
anteriorly. SWI signal loss along the right sylvian fissure, likely
reflecting a combination of petechial hemorrhage and the previously
demonstrated small-volume acute subarachnoid hemorrhage at this
site. Additional petechial hemorrhage within the right caudate
nucleus infarct. Additional petechial hemorrhage versus chronic
microhemorrhages within the right frontal operculum and right
frontal lobe white matter. There is no significant mass effect at
this time. No midline shift.

Small acute cortically based infarct within the right occipital lobe
(PCA vascular territory).

Punctate acute infarct within the left occipital lobe (PCA vascular
territory) (series 5, image 70).

Background mild multifocal T2/FLAIR hyperintensity within the
cerebral white matter is nonspecific, but compatible with chronic
small vessel ischemic disease.

Redemonstrated small chronic infarct within the right cerebellar
hemisphere.

No evidence of intracranial mass.

No extra-axial fluid collection.

Vascular: Expected proximal arterial flow voids.

Skull and upper cervical spine: No focal marrow lesion.

Sinuses/Orbits: Visualized orbits show no acute finding. Trace
scattered paranasal sinus mucosal thickening at the imaged levels.

MRA HEAD FINDINGS:

The intracranial internal carotid arteries are patent. The M1 middle
cerebral arteries are patent. High-grade focal stenosis with
possible short-segment flow gap at site of previously demonstrated
mid to distal right M2 MCA branch occlusion (series 10, images
125-134) (series [ES], image 3). The anterior cerebral arteries are
patent.

The intracranial vertebral arteries are patent. The basilar artery
is patent. The posterior cerebral arteries are patent. A right
posterior communicating artery is present. The left posterior
communicating artery is hypoplastic or absent.

No intracranial aneurysm is identified.
IMPRESSION: MRI brain:

1. Acute right MCA vascular territory infarct as described. This has
significantly increased in extent as compared to the brain MRI of
[DATE], and may have subtly increased in extent as compared to
the head CT performed earlier today. No significant mass effect at
this time
2. SWI signal loss along the right sylvian fissure which likely
reflects a combination of petechial hemorrhage and the small-volume
acute subarachnoid hemorrhage previously demonstrated at this site.
3. Small acute cortically-based infarct within the right occipital
lobe (PCA vascular territory).
4. Punctate acute cortical infarct within the left occipital lobe
(PCA vascular territory).
5. Background chronic findings without interval change, as
described.

MRA head:

1. High-grade focal stenosis with possible short-segment flow gap at
site of previously demonstrated mid-to-distal right M2 MCA branch
occlusion.
2. Elsewhere, no intracranial large vessel occlusion or proximal
high-grade arterial stenosis is identified.

## 2020-05-11 IMAGING — DX DG CHEST 1V PORT
1 series · 1 of 1 positions shown · non-contrast
Comparison: [DATE]

CLINICAL DATA: Hypoxia

EXAM:
PORTABLE CHEST 1 VIEW

[chest ap]
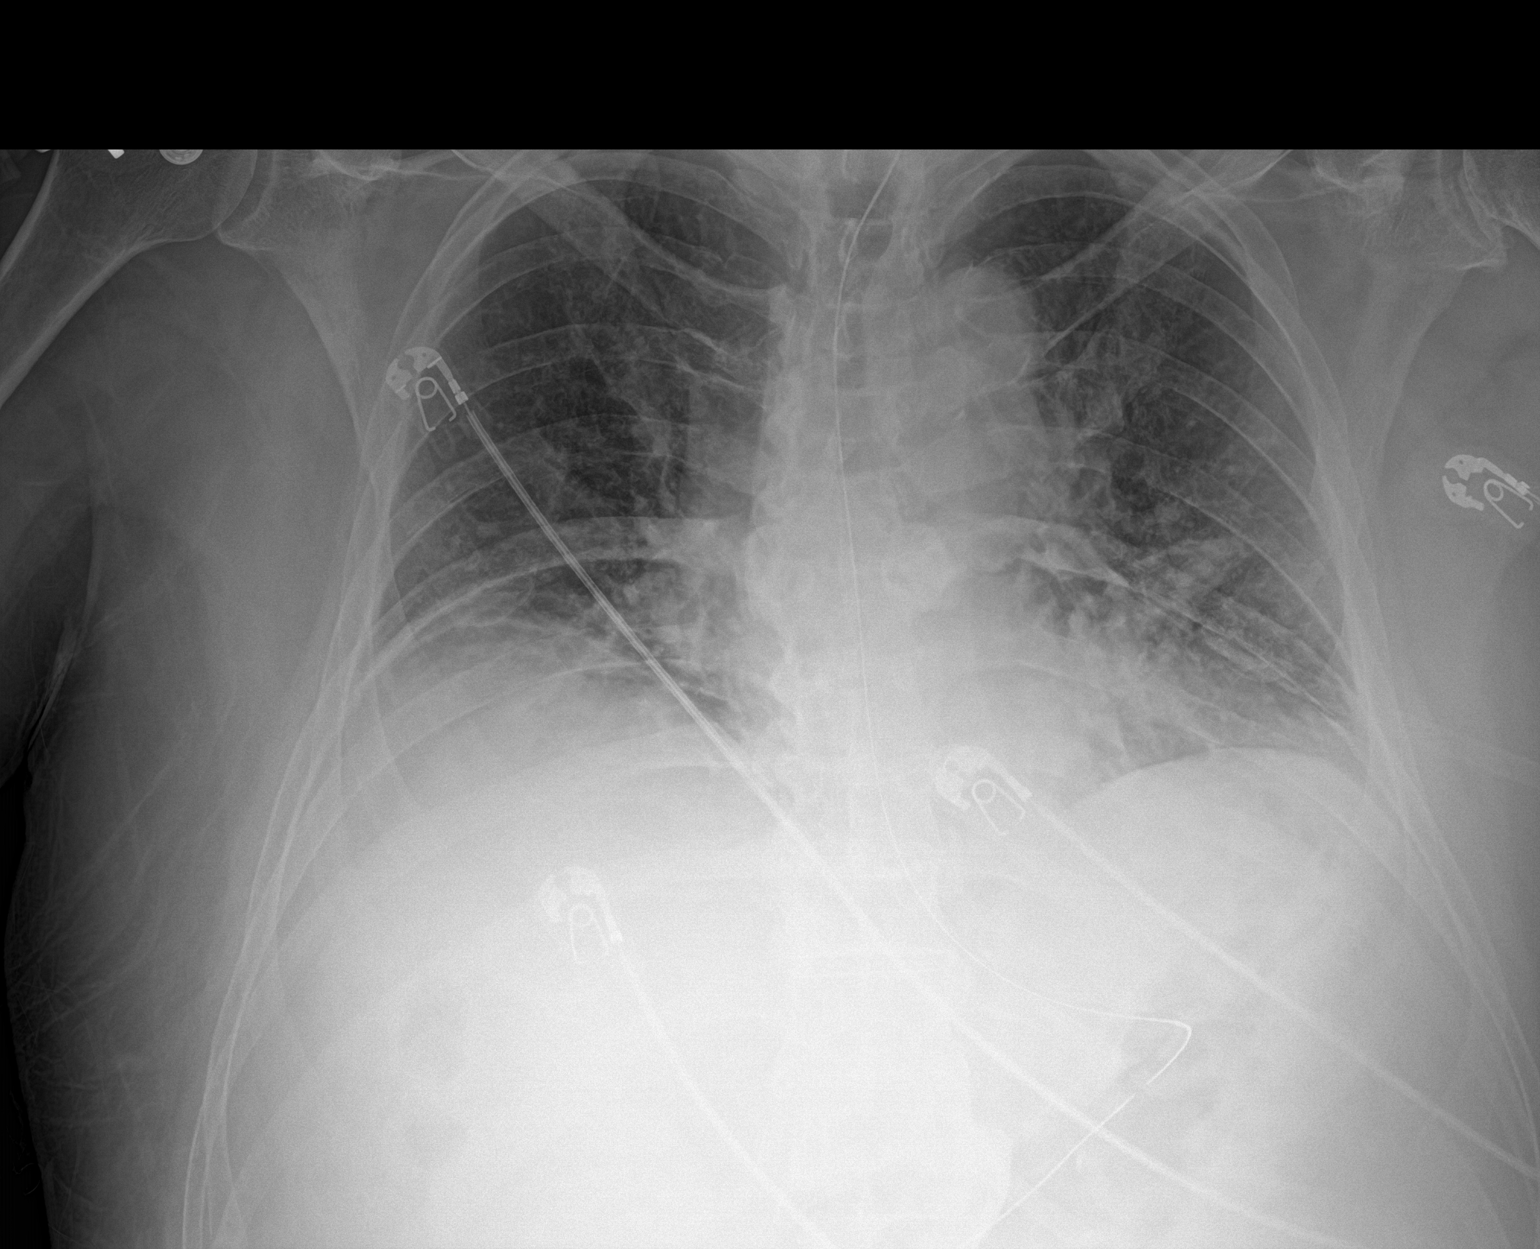

[1 of 1 positions shown; findings below may reference images not displayed]

FINDINGS: Cardiac shadow is stable. Gastric catheter is noted in satisfactory
position. Previously seen endotracheal tube is no longer identified.
Bibasilar atelectatic changes are noted slightly increased when
compared with the prior exam likely accentuated by poor inspiratory
effort. No acute bony abnormality is seen.
IMPRESSION: Increasing bibasilar atelectasis.

## 2020-05-11 IMAGING — MR MR HEAD W/O CM
10 of 12 series · 32 of 48 positions shown · non-contrast
Comparison: Head CT [DATE].

CLINICAL DATA: Stroke, follow-up.

EXAM:
MRI HEAD WITHOUT CONTRAST
MRA HEAD WITHOUT CONTRAST
TECHNIQUE: Multiplanar, multiecho pulse sequences of the brain and surrounding
structures were obtained without intravenous contrast. Angiographic
images of the head were obtained using MRA technique without
contrast.

[Series 5: DWI · axial · 3.0mm · 0.88mm/px · z∈[-50,+92]mm · 7 of 102 slices shown (1 of 4)]
[im 1/102]
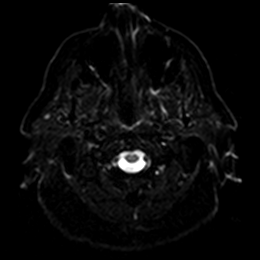
[im 17/102]
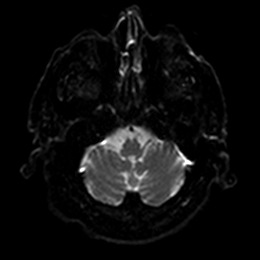
[im 34/102]
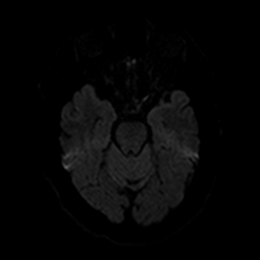
[im 51/102]
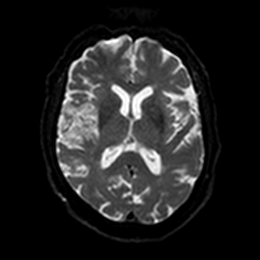
[im 68/102]
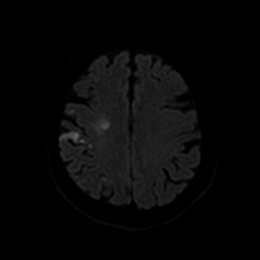
[im 85/102]
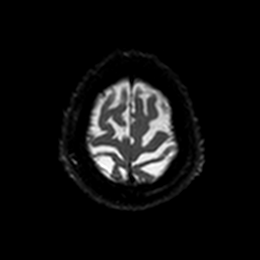
[im 102/102]
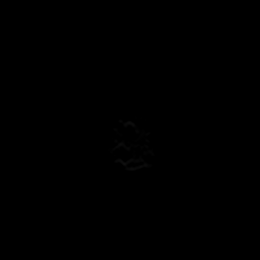

[Series 6: DWI · axial · 3.0mm · 0.88mm/px · z∈[-50,+92]mm · 3 of 51 slices shown (2 of 4)]
[im 1/51]
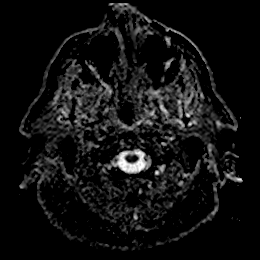
[im 26/51]
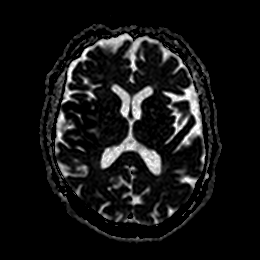
[im 51/51]
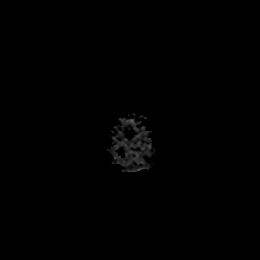

[Series 7: DWI · coronal · 4.0mm · 0.88mm/px · 4 of 66 slices shown (3 of 4)]
[im 1/66]
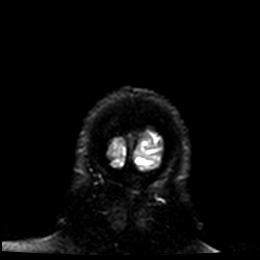
[im 22/66]
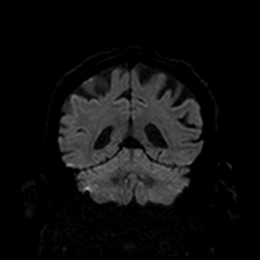
[im 44/66]
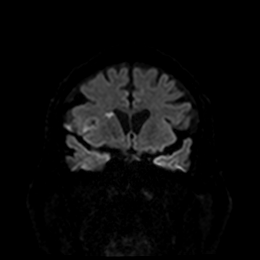
[im 66/66]
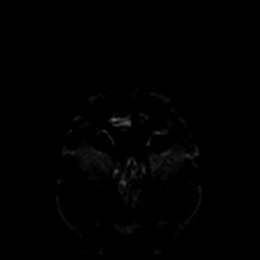

[Series 8: DWI · coronal · 4.0mm · 0.88mm/px · 2 of 33 slices shown (4 of 4)]
[im 1/33]
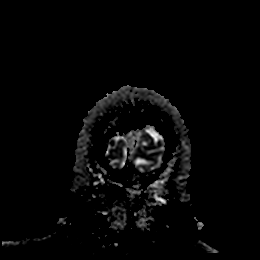
[im 33/33]
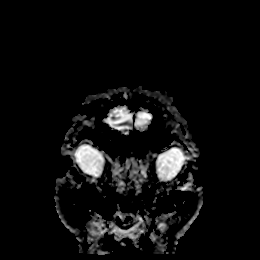

[Series 9: T1 · sagittal · 5.0mm · 0.75mm/px · 2 of 25 slices shown]
[im 1/25]
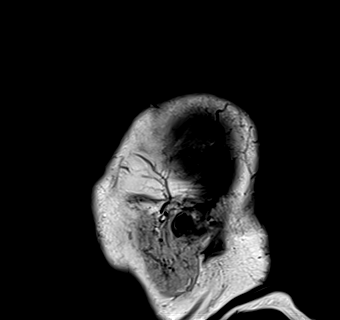
[im 25/25]
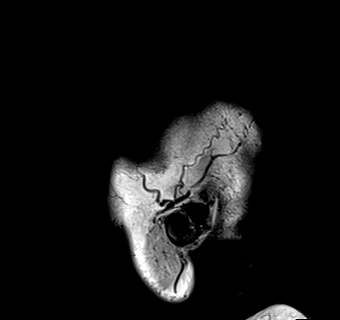

[Series 14: T2 · axial · 5.0mm · 0.72mm/px · z∈[-50,+92]mm · 2 of 26 slices shown (1 of 2)]
[im 1/26]
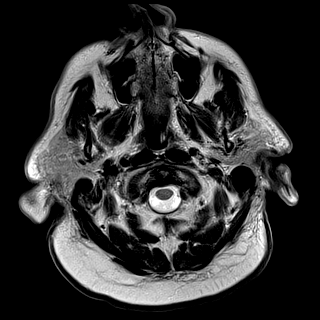
[im 26/26]
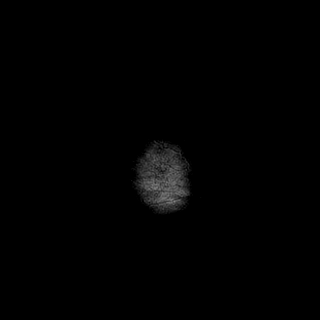

[Series 16: pha_images · axial · 3.0mm · 0.90mm/px · z∈[-58,+98]mm · 4 of 56 slices shown]
[im 1/56]
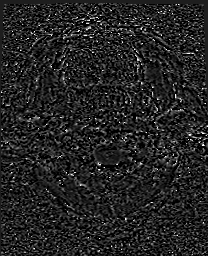
[im 19/56]
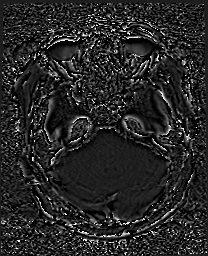
[im 37/56]
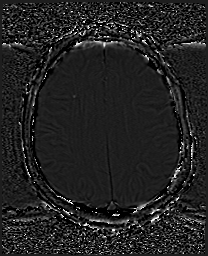
[im 56/56]
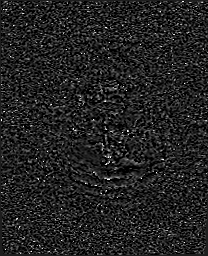

[Series 17: swi_images · axial · 3.0mm · 0.90mm/px · z∈[-61,+107]mm · 4 of 60 slices shown]
[im 1/60]
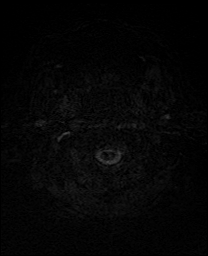
[im 20/60]
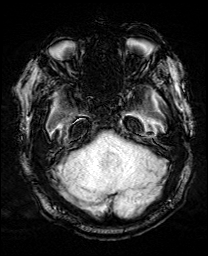
[im 40/60]
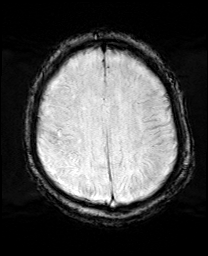
[im 60/60]
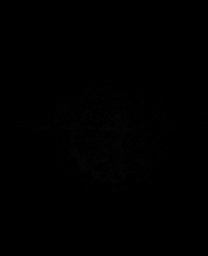

[Series 19: FLAIR · axial · 5.0mm · 0.45mm/px · z∈[-48,+94]mm · 2 of 26 slices shown]
[im 1/26]
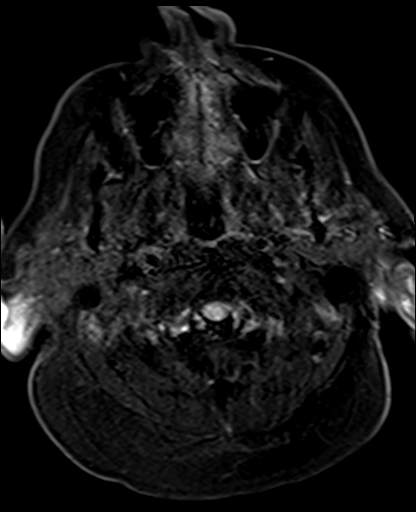
[im 26/26]
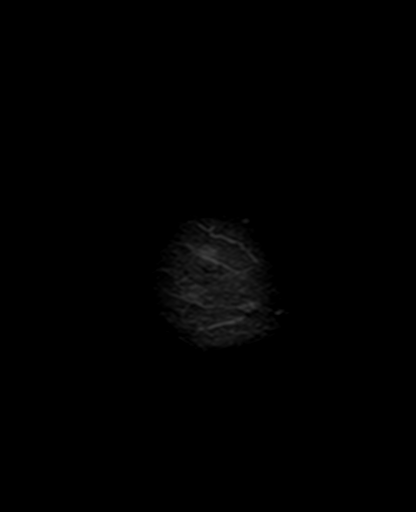

[Series 21: T2 · coronal · 5.0mm · 0.34mm/px · 2 of 29 slices shown (2 of 2)]
[im 1/29]
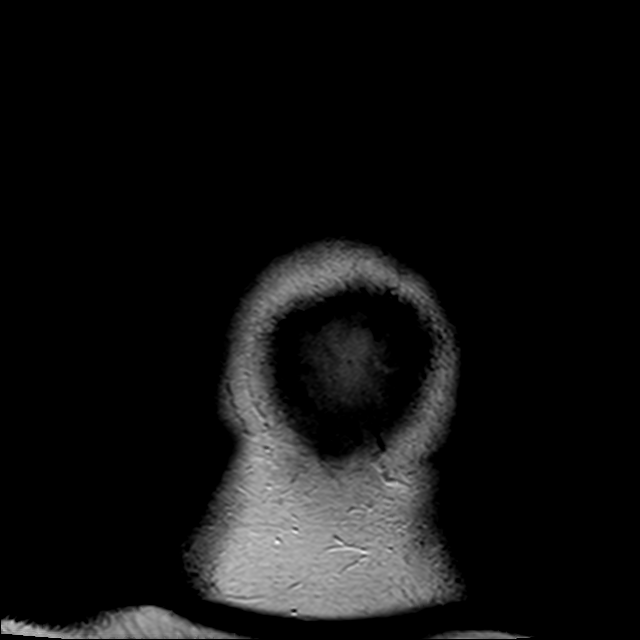
[im 29/29]
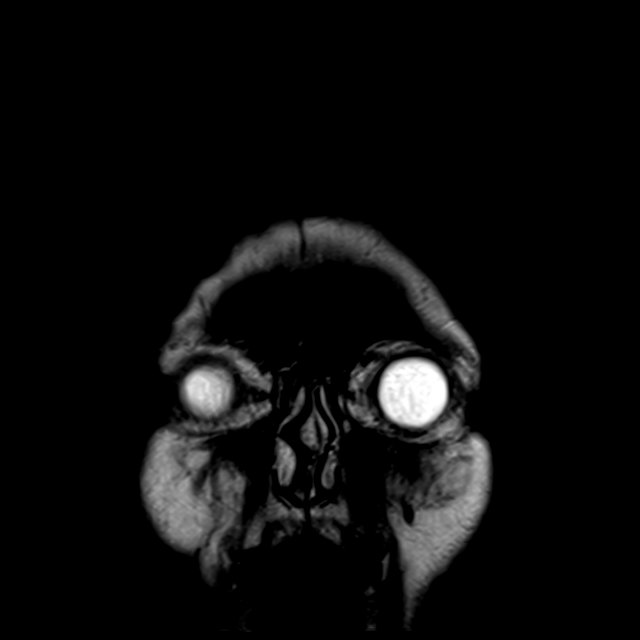

[32 of 48 positions shown; findings below may reference images not displayed]

Images from thrombectomy
[DATE]. Noncontrast head CT, CT angiogram head/neck and CT
perfusion [DATE]. MRI/MRA head [DATE]. Small chronic infarct
within the right cerebellar hemisphere.
FINDINGS: MRI HEAD FINDINGS

Brain:

Mild-to-moderate cerebral atrophy. Comparatively mild cerebellar
atrophy.

Acute cortical/subcortical infarct within the right MCA vascular
territory. The dominant component of the infarct is present within
the right frontal operculum and right insula and now measures 4.7 x
3.9 x 4.2 cm. However, there are additional cortical/subcortical
acute infarcts more superiorly and posteriorly within the right
frontal lobe and right parietal lobe (with involvement of the pre
and postcentral gyri). Additional small acute infarcts are also
present within the right caudate nucleus and right frontal lobe more
anteriorly. SWI signal loss along the right sylvian fissure, likely
reflecting a combination of petechial hemorrhage and the previously
demonstrated small-volume acute subarachnoid hemorrhage at this
site. Additional petechial hemorrhage within the right caudate
nucleus infarct. Additional petechial hemorrhage versus chronic
microhemorrhages within the right frontal operculum and right
frontal lobe white matter. There is no significant mass effect at
this time. No midline shift.

Small acute cortically based infarct within the right occipital lobe
(PCA vascular territory).

Punctate acute infarct within the left occipital lobe (PCA vascular
territory) (series 5, image 70).

Background mild multifocal T2/FLAIR hyperintensity within the
cerebral white matter is nonspecific, but compatible with chronic
small vessel ischemic disease.

Redemonstrated small chronic infarct within the right cerebellar
hemisphere.

No evidence of intracranial mass.

No extra-axial fluid collection.

Vascular: Expected proximal arterial flow voids.

Skull and upper cervical spine: No focal marrow lesion.

Sinuses/Orbits: Visualized orbits show no acute finding. Trace
scattered paranasal sinus mucosal thickening at the imaged levels.

MRA HEAD FINDINGS:

The intracranial internal carotid arteries are patent. The M1 middle
cerebral arteries are patent. High-grade focal stenosis with
possible short-segment flow gap at site of previously demonstrated
mid to distal right M2 MCA branch occlusion (series 10, images
125-134) (series [ES], image 3). The anterior cerebral arteries are
patent.

The intracranial vertebral arteries are patent. The basilar artery
is patent. The posterior cerebral arteries are patent. A right
posterior communicating artery is present. The left posterior
communicating artery is hypoplastic or absent.

No intracranial aneurysm is identified.
IMPRESSION: MRI brain:

1. Acute right MCA vascular territory infarct as described. This has
significantly increased in extent as compared to the brain MRI of
[DATE], and may have subtly increased in extent as compared to
the head CT performed earlier today. No significant mass effect at
this time
2. SWI signal loss along the right sylvian fissure which likely
reflects a combination of petechial hemorrhage and the small-volume
acute subarachnoid hemorrhage previously demonstrated at this site.
3. Small acute cortically-based infarct within the right occipital
lobe (PCA vascular territory).
4. Punctate acute cortical infarct within the left occipital lobe
(PCA vascular territory).
5. Background chronic findings without interval change, as
described.

MRA head:

1. High-grade focal stenosis with possible short-segment flow gap at
site of previously demonstrated mid-to-distal right M2 MCA branch
occlusion.
2. Elsewhere, no intracranial large vessel occlusion or proximal
high-grade arterial stenosis is identified.

## 2020-05-11 IMAGING — CT CT HEAD W/O CM
4 of 5 series · 15 of 47 positions shown, 17 images · non-contrast
Comparison: JANU flat panel head CT [RC] hours on [DATE].
Presentation plain head CT [RC] hours [DATE].

CLINICAL DATA: 74-year-old male status post code stroke
presentation yesterday with right M3 occlusion status post
endovascular revascularization. Contrast staining, subarachnoid
contrast/blood intraoperatively. Subsequent encounter.

EXAM:
CT HEAD WITHOUT CONTRAST
TECHNIQUE: Contiguous axial images were obtained from the base of the skull
through the vertex without intravenous contrast.

[Series 3: head without · axial · non-contrast · 0.42mm/px · z∈[-109,-4]mm · 4 of 35 slices shown]
[im 7/35  brain]
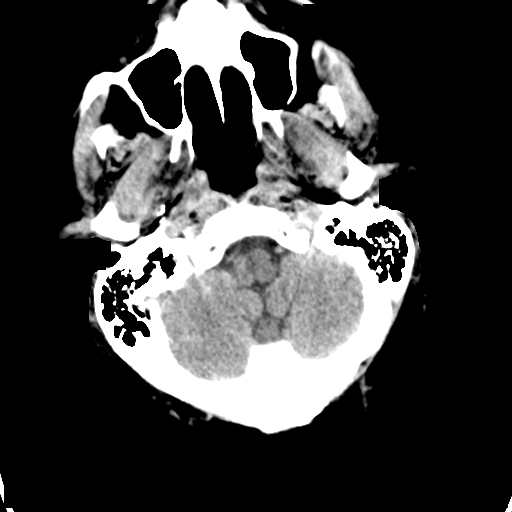
[im 14/35  brain]
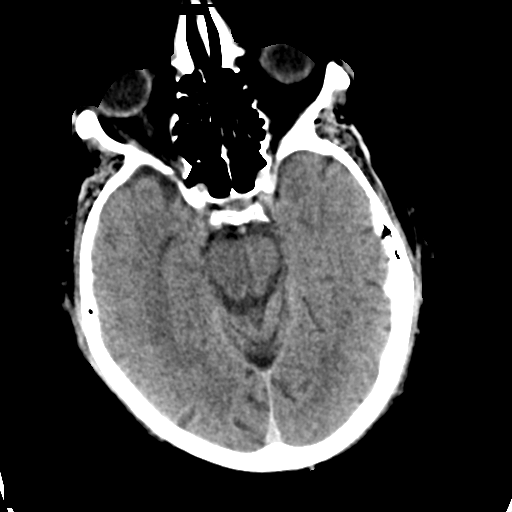
[im 21/35  brain]
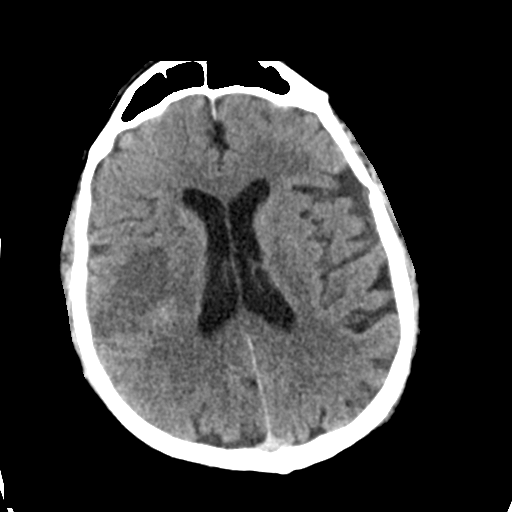
[im 28/35  brain]
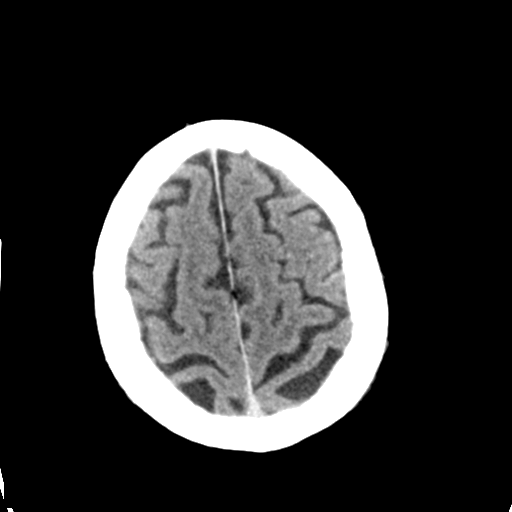

[Series 5: head without cor · coronal · non-contrast · 0.35mm/px · 3 of 69 slices shown]
[im 23/69  brain]
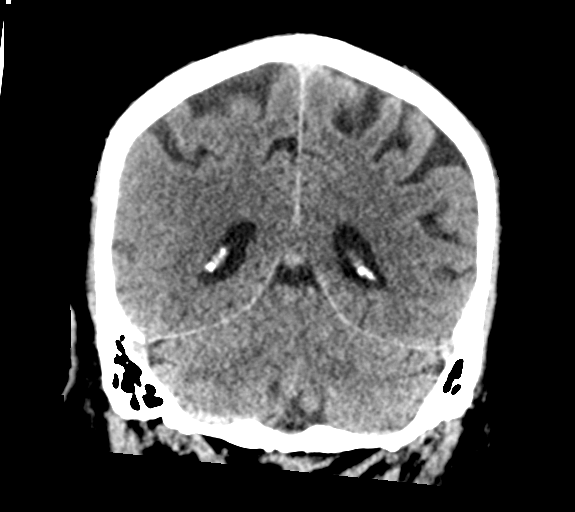
[im 31/69  brain]
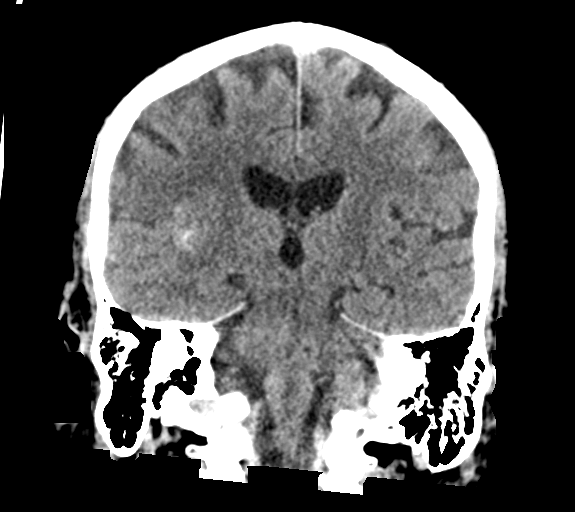
[im 38/69  brain]
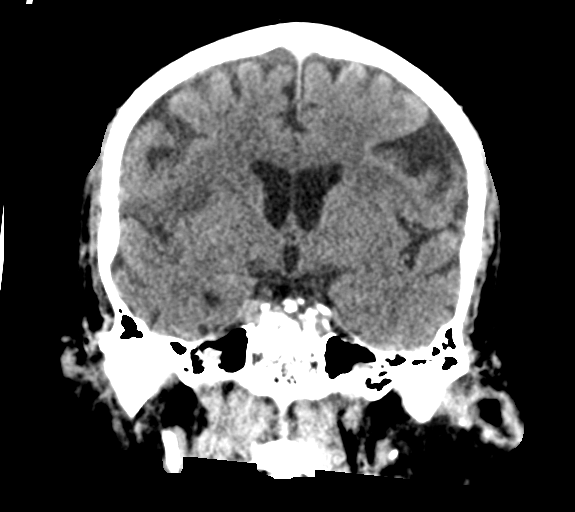

[Series 6: head without sag · sagittal · non-contrast · 0.35mm/px · 3 of 61 slices shown]
[im 22/61  brain]
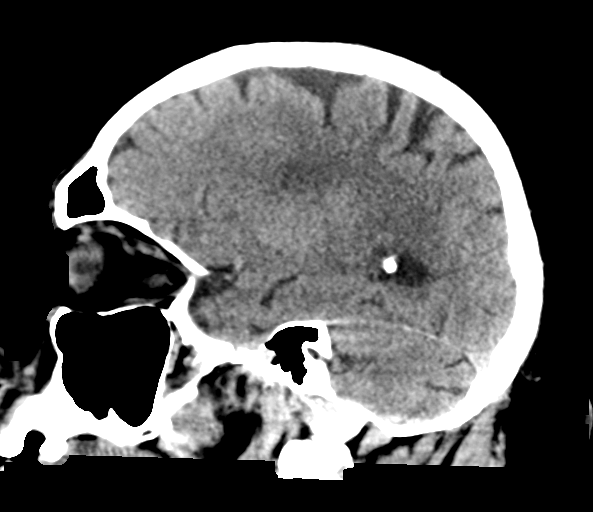
[im 31/61  brain]
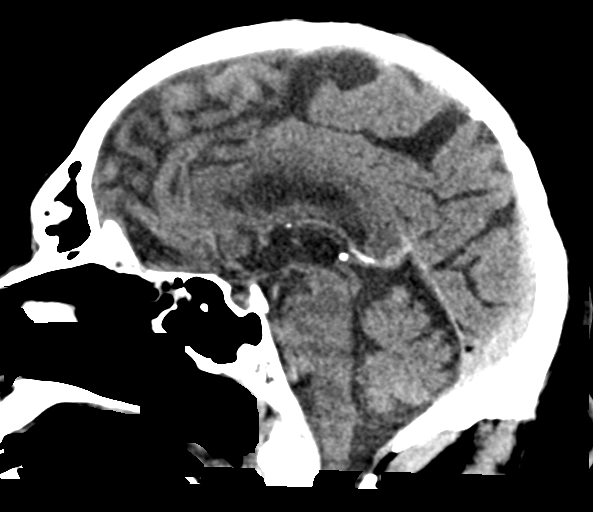
[im 39/61  brain]
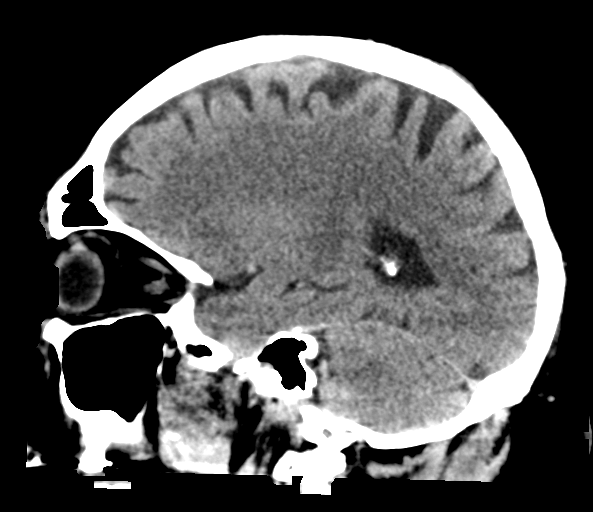

[Series 7: head without ax · axial · non-contrast · 0.39mm/px · z∈[-135,-17]mm · 5 of 36 slices shown, 7 images]
[im 6/36  brain]
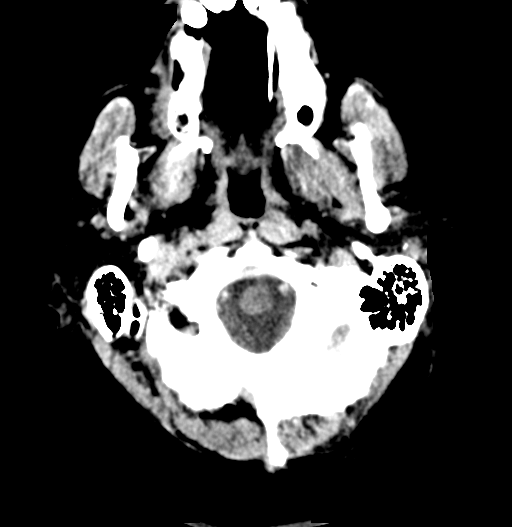
[im 6/36  bone]
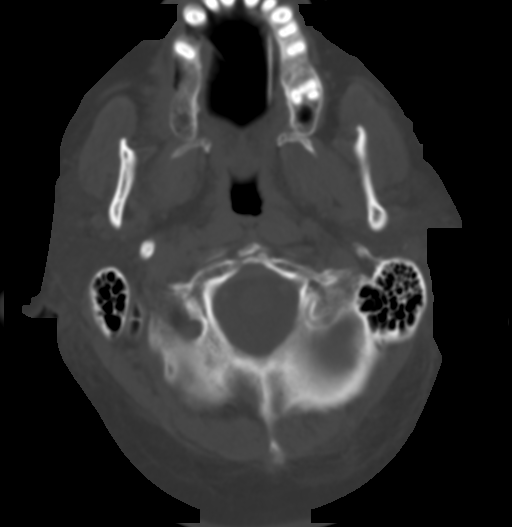
[im 12/36  brain]
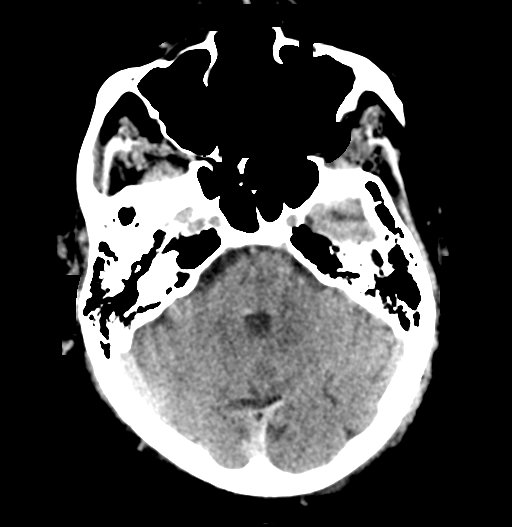
[im 18/36  brain]
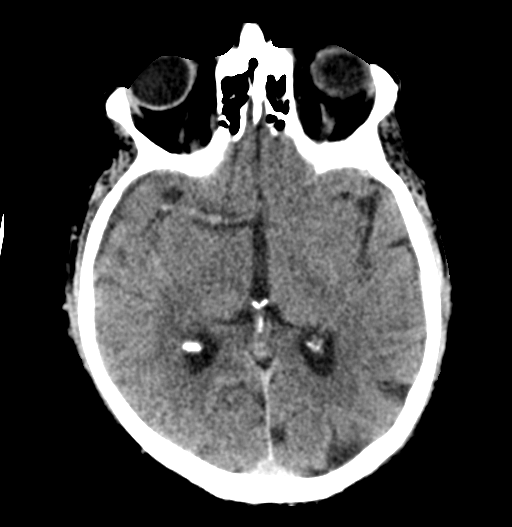
[im 24/36  brain]
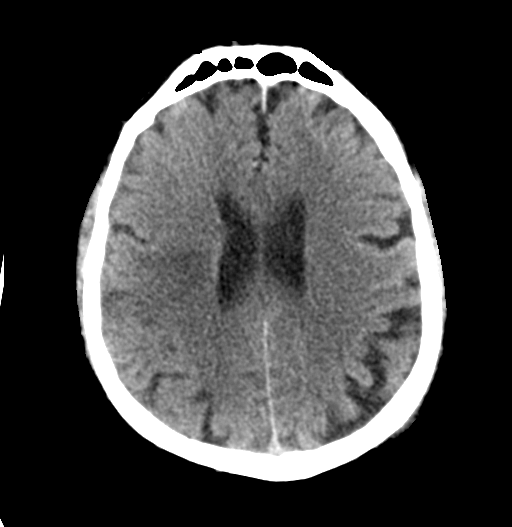
[im 30/36  brain]
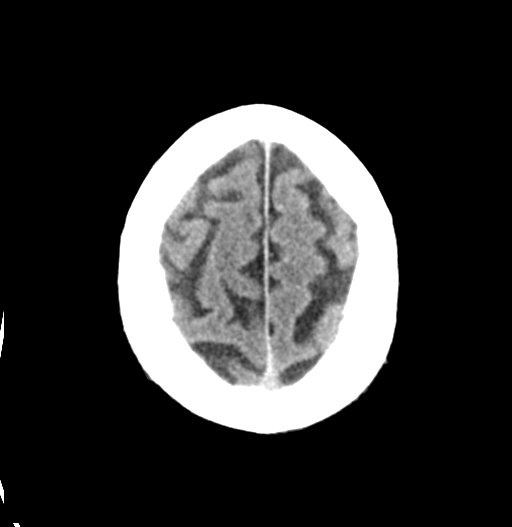
[im 30/36  bone]
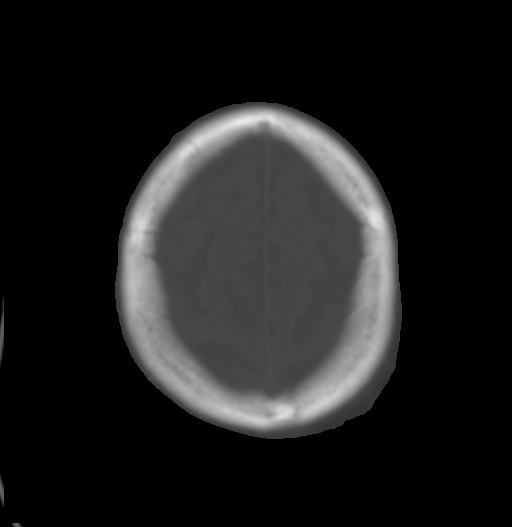

[15 of 47 positions shown; findings below may reference images not displayed]

FINDINGS: Brain: Small volume subarachnoid hemorrhage along the right sylvian
fissure (series 3, image 18), new from the presentation CT yesterday
but regressed compared to [RC] hours yesterday. No associated
intraventricular hemorrhage or ventriculomegaly. Normal basilar
cisterns. No midline shift.

Roughly 4 cm area of cytotoxic edema is evident now at the right
frontal operculum on series 3, image 21. No intra-axial hemorrhage
identified. Stable gray-white matter differentiation elsewhere.

Vascular: Some residual intravascular contrast is suspected.

Skull: No acute osseous abnormality identified.

Sinuses/Orbits: Visualized paranasal sinuses and mastoids are stable
and well aerated.

Other: Endotracheal tube partially visible in the oral cavity. No
acute orbit or scalp soft tissue finding.
IMPRESSION: 1. Small volume subarachnoid hemorrhage along the right Sylvian
fissure is regressed compared to [RC] hours yesterday.
2. Cytotoxic edema now visible at the right frontal operculum. But
no malignant hemorrhagic transformation. No significant intracranial
mass effect.
3. Preliminary report of the above discussed by telephone with [REDACTED] on [DATE] at [RC] hours.

## 2020-05-11 IMAGING — DX DG ABD PORTABLE 1V
1 series · 1 of 1 positions shown · non-contrast
Comparison: None.

CLINICAL DATA: Check gastric catheter placement

EXAM:
PORTABLE ABDOMEN - 1 VIEW

[abdomen kub]
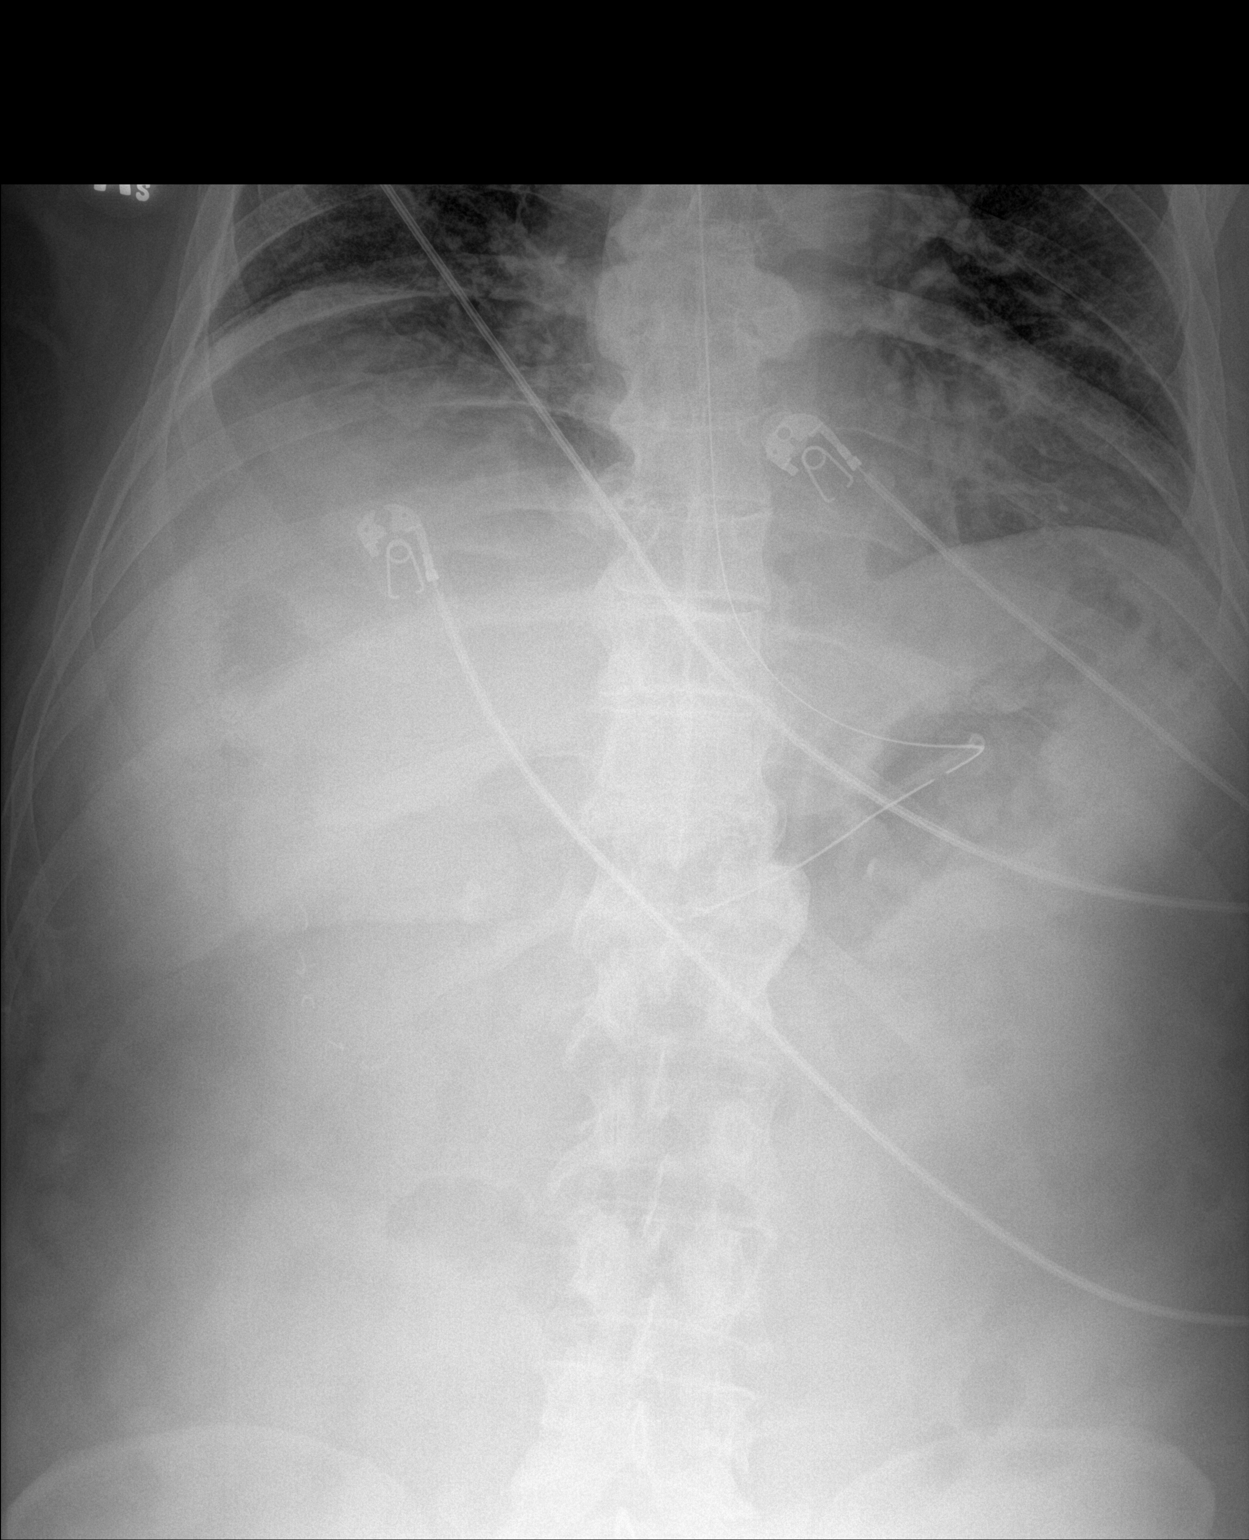

[1 of 1 positions shown; findings below may reference images not displayed]

FINDINGS: Gastric catheter is noted within the stomach. No obstructive changes
are seen. No free air is noted.
IMPRESSION: Gastric catheter within the stomach.

## 2020-05-11 MED ORDER — PREGABALIN 50 MG PO CAPS
50.0000 mg | ORAL_CAPSULE | Freq: Two times a day (BID) | ORAL | Status: DC
  Filled 2020-05-11: qty 1

## 2020-05-11 MED ORDER — INSULIN GLARGINE 100 UNIT/ML ~~LOC~~ SOLN
15.0000 [IU] | Freq: Every day | SUBCUTANEOUS | Status: DC
  Administered 2020-05-11: 15 [IU] via SUBCUTANEOUS
  Filled 2020-05-11 (×2): qty 0.15

## 2020-05-11 MED ORDER — ONDANSETRON HCL 4 MG/2ML IJ SOLN
4.0000 mg | Freq: Four times a day (QID) | INTRAMUSCULAR | Status: DC | PRN
  Administered 2020-05-11 (×2): 4 mg via INTRAVENOUS
  Filled 2020-05-11 (×2): qty 2

## 2020-05-11 MED ORDER — CHLORHEXIDINE GLUCONATE CLOTH 2 % EX PADS
6.0000 | MEDICATED_PAD | Freq: Every day | CUTANEOUS | Status: DC
  Administered 2020-05-11 – 2020-05-14 (×4): 6 via TOPICAL

## 2020-05-11 MED ORDER — ATORVASTATIN CALCIUM 40 MG PO TABS
40.0000 mg | ORAL_TABLET | Freq: Every day | ORAL | Status: DC
  Administered 2020-05-11 – 2020-05-14 (×4): 40 mg
  Filled 2020-05-11 (×3): qty 1

## 2020-05-11 MED ORDER — PREGABALIN 50 MG PO CAPS
50.0000 mg | ORAL_CAPSULE | Freq: Two times a day (BID) | ORAL | Status: DC
  Administered 2020-05-11 – 2020-05-14 (×7): 50 mg
  Filled 2020-05-11 (×6): qty 1

## 2020-05-11 MED ORDER — ATORVASTATIN CALCIUM 40 MG PO TABS
40.0000 mg | ORAL_TABLET | Freq: Every day | ORAL | Status: DC
  Filled 2020-05-11: qty 1

## 2020-05-11 MED ORDER — INSULIN GLARGINE 100 UNIT/ML ~~LOC~~ SOLN
20.0000 [IU] | Freq: Every day | SUBCUTANEOUS | Status: DC
  Filled 2020-05-11: qty 0.2

## 2020-05-11 NOTE — Progress Notes (Signed)
SLP Cancellation Note  Patient Details Name: Thomas Sampson MRN: [id number] DOB: 07/03/1945   Cancelled treatment:       Reason Eval/Treat Not Completed: Patient not medically ready. Pt ventilated. Will f/u for readiness.    Shaniece Bussa, Katherene Ponto 05/11/2020, 8:02 AM

## 2020-05-11 NOTE — Progress Notes (Signed)
Patient transported from 4N18 to CT and back. Patient did vomit en route. RT suctioned patient orally and endotracheal upon arrival to CT. Spo2 maintained 100%.

## 2020-05-11 NOTE — Progress Notes (Signed)
Spoke with Dr. Lynetta Mare regarding MRI and was given the go ahead to take the patient down.  I then called Mri and let them know that we are ready whenever they can accommodate the patient.  Was told that they will call us when ready but it may not be until this evening.

## 2020-05-11 NOTE — Progress Notes (Signed)
Patient extubated this morning.  Had instance of emesis.  Patient desated to 87% and bradycardia to 45.  Quickly came back up.  CCM Provider Elie Confer was bedside.  Did a 12 lead ekg and then ordered and administered iv zofran.  Per CCM RN spoke with Mri to le them know that the providers want to wait til later to obtain the scan.  Will reevaluate tonight.

## 2020-05-11 NOTE — Procedures (Signed)
Extubation Procedure Note  Patient Details:   Name: Thomas Sampson DOB: Dec 14, 1945 MRN: [id number]   Airway Documentation:    Vent end date: 05/11/20 Vent end time: 1001   Evaluation  O2 sats: stable throughout Complications: No apparent complications Patient did tolerate procedure well. Bilateral Breath Sounds: Clear,Diminished   Pt extubated to 4L [place] per MD order. Pt had positive cuff leak prior to extubation. No stridor noted.  Vilinda Blanks 05/11/2020, 10:02 AM

## 2020-05-11 NOTE — Plan of Care (Signed)
  Problem: Education: Goal: Knowledge of disease or condition will improve Outcome: Progressing   

## 2020-05-11 NOTE — Progress Notes (Addendum)
NAME:  Thomas Sampson, MRN:  [id number], DOB:  1945/04/13, LOS: 1 ADMISSION DATE:  05/10/2020, CONSULTATION DATE: 4/11 REFERRING MD:  Leonie Man, CHIEF COMPLAINT:  Vent management for respiratory failure and critical care services    History of Present Illness:  This is a 75 year old male who presented initially to [facility] on 4/11 after waking up at 7:30 AM with new left-sided weakness and slurred speech.  He had last been seen normal at 11 PM the night prior.  His initial symptoms improved upon arrival to the emergency room with only left-sided weakness and diminished fine motor skills with a NIH scale of 0.  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  CT perfusion showed a ischemic cord 29 cm in the penumbra he was referred to [facility] for mechanical thrombectomy.  He had clinical decline while at [facility] with decline of NIH stroke scale to 10, right gaze deviation and left visual field cut and left-sided ataxia.  He arrived at [facility] later in the afternoon on 4/11 he was emergently brought to interventional radiology where he underwent revascularization of occluded superior division right MCA through mechanical thrombectomy.  He was admitted to the intensive care postoperatively on mechanical ventilation for further supportive care  Pertinent  Medical History  Hypertension, hyperlipidemia, diabetes, CKD stage IV, GERD, prior renal calculi, claudication.  Significant Hospital Events: Including procedures, antibiotic start and stop dates in addition to other pertinent events   . 4/11 admitted with acute right MCA stroke.  Initial NIH 0, decompensated in the emergency room at [facility] regional with NIH scale of 10.  He was not a thrombolytic candidate given unknown time of initial symptoms.  On time of transfer had right gaze deviation left visual field cut and left-sided ataxia.   Based on CT brain perfusion scan underwent successful right MCA thrombectomy post CT brain showed contrast staining in the right putamen and moderate hyperdensity in the right posterior perisylvian region.  Return to the intensive care on mechanical ventilation  Interim History / Subjective:  Awake and alert 4/12 am  Extubated 4/12 after weaning with good volumes and RR  Will hold repeat MRI until patient has maintained airway and doing well immediately post extubation. ( This afternoon/ Evening or in am per Leonie Man is ok) Vomited after extubation with vagal response, HR dropped to 42>> NO stomach pain per patient , just nausea  T max 98.2 Na 4.2 Creatinine 2.26 ( stable ) + 1239 ( 700 cc's last 24 hours) WBC 17.2, HGB 12.7  Objective   Blood pressure 129/69, pulse (!) 109, temperature 98.7 F (37.1 C), temperature source Axillary, resp. rate 16, height 5\' 9"  (1.753 m), SpO2 93 %.    Vent Mode: PSV;CPAP FiO2 (%):  [40 %-50 %] 40 % Set Rate:  [18 bmp] 18 bmp Vt Set:  [560 mL-5560 mL] 560 mL PEEP:  [5 cmH20] 5 cmH20 Pressure Support:  [5 cmH20] 5 cmH20 Plateau Pressure:  [18 cmH20-23 cmH20] 18 cmH20   Intake/Output Summary (Last 24 hours) at 05/11/2020 1006 Last data filed at 05/11/2020 0800 Gross per 24 hour  Intake 2009.91 ml  Output 770 ml  Net 1239.91 ml   There were no vitals filed for this visit.  Examination: General: Well-developed 75 year old male patient currently off sedation, following commands, in NAD  HENT: Normocephalic atraumatic orally intubated mucous membranes moist pupils equal reactive Lungs: Bilateral  chest excursion, Clear equal chest rise no accessory use, diminished per bases Cardiovascular: Regular rate and rhythm no murmur rub or gallop, brady to 42 with vagal stimulation from vomiting Abdomen: Soft not tender, ND, NT,  no organomegaly bowel sounds are present Extremities: Warm and dry with brisk capillary refill arterial dressing on right groin is intact  without staining, No edema LE Neuro: Awake and alert, Moving right side, LUE non-movement, RLE 3/4, LLE wiggles toes GU: Condom cath  Labs/imaging that I havepersonally reviewed  (right click and "Reselect all SmartList Selections" daily)  I have reviewed see below  Resolved Hospital Problem list    Assessment & Plan:  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 Former smoker with a 40 pack year smoking history  Extubated 4/12 am, vomited after extubation High risk aspiration  CXR w/ RUL collapse >> Re-expanded  after intubation  Plan Chest PT via bed IS Q 1 while awake as able  Mobilize as able HOB elevated at all times Respiratory culture not obtained while intubated  Portable chest x-ray in am and prn  Consider addition of Unasyn prophylactic Treat nausea to minimize risk of aspiration Swallow evaluation  Place NG tube for medication administration Cor Track if dose not pass swallow 4/12    Drug-induced hypotension.  Suspect this is secondary to sedation Plan Holding hydrochlorothiazide, lisinopril, and Lopressor. BP management goal 120-140 . Creatinine remains stable but elevated at 2.26  Acute kidney injury.  Looks like baseline serum creatinine 1.04.  He has had IV dye load, currently serum creatinine 2.26 Plan Continue maintenance IV fluids we will continue saline for now Strict intake output Ensure mean arterial pressure greater  than 65, need to avoid hypotension Renal dose medications A.M. chemistry  History of diabetes with what appears to be diabetic neuropathy at baseline A1c >> 8.1 Hyperglycemic per serum and CBG Plan Sliding scale insulin Added 15 Lantus Lyrica added at 50 mg BID  Nausea Vomited 4/12 ands 4/11  Vagal response with nausea to 42 Plan Zofran prn Check QTc prior to each dose Hold form QTc > 0.5  History of hyperlipidemia Plan Consider starting statin post stroke. Trend triglycerides on Cleviprex  Best practice (right click and "Reselect all SmartList Selections" daily)  Diet:  NPO Pain/Anxiety/Delirium protocol (if indicated): Yes (RASS goal -2) VAP protocol (if indicated): Yes DVT prophylaxis: SCD GI prophylaxis: PPI Glucose control:  SSI Yes Central venous access:  N/A Arterial line:  N/A Foley:  N/A Mobility:  bed rest  PT consulted: N/A Last date of multidisciplinary goals of care discussion [pending ] Code Status:  full code Disposition: ICU care   Labs   CBC: Recent Labs  Lab 05/10/20 0940 05/10/20 1946 05/11/20 0405  WBC 9.0  --  17.2*  NEUTROABS 6.5  --  15.3*  HGB 14.6 13.6 12.7*  HCT 42.6 40.0 38.8*  MCV 92.2  --  95.1  PLT 270  --  416    Basic Metabolic Panel: Recent Labs  Lab 05/10/20  0940 05/10/20 1946 05/11/20 0405  NA 139 140 138  K 3.7 4.0 4.2  CL 103  --  102  CO2 26  --  21*  GLUCOSE 207*  --  409*  BUN 31*  --  28*  CREATININE 2.26*  --  2.26*  CALCIUM 9.7  --  8.7*   GFR: Estimated Creatinine Clearance: 33.4 mL/min (A) (by C-G formula based on SCr of 2.26 mg/dL (H)). Recent Labs  Lab 05/10/20 0940 05/11/20 0405  WBC 9.0 17.2*    Liver Function Tests: Recent Labs  Lab 05/10/20 0940 05/11/20 0405  AST 20 15  ALT 16 15  ALKPHOS 45 47  BILITOT 0.8 0.8  PROT 7.2 6.1*  ALBUMIN 4.1 3.4*   No results for input(s): LIPASE, AMYLASE in the last 168 hours. No results for input(s): AMMONIA in the last 168 hours.  ABG     Component Value Date/Time   PHART 7.310 (L) 05/10/2020 1946   PCO2ART 48.4 (H) 05/10/2020 1946   PO2ART 75 (L) 05/10/2020 1946   HCO3 24.4 05/10/2020 1946   TCO2 26 05/10/2020 1946   ACIDBASEDEF 2.0 05/10/2020 1946   O2SAT 93.0 05/10/2020 1946     Coagulation Profile: Recent Labs  Lab 05/10/20 0940  INR 1.0    Cardiac Enzymes: No results for input(s): CKTOTAL, CKMB, CKMBINDEX, TROPONINI in the last 168 hours.  HbA1C: Hgb A1c MFr Bld  Date/Time Value Ref Range Status  05/11/2020 04:05 AM 8.2 (H) 4.8 - 5.6 % Final    Comment:    (NOTE) Pre diabetes:          5.7%-6.4%  Diabetes:              >6.4%  Glycemic control for   <7.0% adults with diabetes   05/10/2020 08:29 PM 7.8 (H) 4.8 - 5.6 % Final    Comment:    (NOTE) Pre diabetes:          5.7%-6.4%  Diabetes:              >6.4%  Glycemic control for   <7.0% adults with diabetes     CBG: Recent Labs  Lab 05/10/20 1432 05/10/20 1923 05/10/20 2346 05/11/20 0336 05/11/20 0720  GLUCAP 291* 292* 386* 390* 341*     Critical care time: my cct 45 minutes. 1/3 chart review, 1/3 face to face and plan of care d/w staff and 1/3 developing plan of care     Magdalen Spatz, MSN, AGACNP-BC [place] for personal pager PCCM on call pager [phone] Hospital Use Only Office patients DO NOT USE [facility] 05/11/2020 10:06 AM

## 2020-05-11 NOTE — Progress Notes (Signed)
eLink Physician-Brief Progress Note Patient Name: Thomas Sampson DOB: November 03, 1945 MRN: [id number]   Date of Service  05/11/2020  HPI/Events of Note  Patient with sub-optimal glycemic control.  eICU Interventions  Lantus 15 units SQ daily ordered, first dose to be given now.        [name] [name] 05/11/2020, 4:22 AM

## 2020-05-11 NOTE — Progress Notes (Signed)
Pt vomited in route to CT and HR dropped to 25. Pt suctioned in CT bay. No changes to neuro assessment. Neuro MD Quinn Axe notified through Amion once back on the unit. RN to continue to monitor.

## 2020-05-11 NOTE — Progress Notes (Addendum)
PT Cancellation Note  Patient Details Name: Thomas Sampson MRN: [id number] DOB: April 23, 1945   Cancelled Treatment:    Reason Eval/Treat Not Completed: Active bedrest order Per RN, pt lethargic post extubation and pending MRI. Will follow up tomorrow.  Wyona Almas, PT, DPT Acute Rehabilitation Services Pager [phone] Office [phone]    Deno Etienne 05/11/2020, 3:33 PM

## 2020-05-11 NOTE — Progress Notes (Signed)
Referring Physician(s): Code stoke- Leonie Man, Pramod S (neurology)  Supervising Physician: Luanne Bras  Patient Status:  [facility] - In-pt  Chief Complaint: Stroke F/U  Subjective:  History of acute CVA s/p cerebral arteriogram with emergent mechanical thrombectomy of right MCA superior division occlusion achieving a TICI 2c revascularization via right femoral approach 05/10/2020 by Dr. Estanislado Pandy. Patient laying in bed intubated with sedation (weaning), awake and follows simple commands. Moving right side and LLE, no movements in LUE. Right gaze deviation able to cross midline. Wife, brother, and RN at bedside.  CT head this AM: 1. Small volume subarachnoid hemorrhage along the right Sylvian fissure is regressed compared to 1758 hours yesterday. 2. Cytotoxic edema now visible at the right frontal operculum. But no malignant hemorrhagic transformation. No significant intracranial mass effect. 3. Preliminary report of the above discussed by telephone with Dr. Quinn Axe on 05/11/2020 at 0552 hours.   Allergies: Demerol [meperidine]  Medications: Prior to Admission medications   Medication Sig Start Date End Date Taking? Authorizing Provider  Alpha-Lipoic Acid 600 MG TABS Take by mouth.    [provider]  amLODipine (NORVASC) 2.5 MG tablet Take 2.5 mg by mouth daily.    [provider]  aspirin EC 81 MG tablet Take by mouth.    [provider]  calcitRIOL (ROCALTROL) 0.25 MCG capsule Take 0.25 mcg by mouth daily. 04/26/20   [provider]  Cholecalciferol 2000 units CAPS Take 2,000 Units by mouth daily.    [provider]  cholestyramine Lucrezia Starch) 4 GM/DOSE powder Take by mouth 2 (two) times daily with a meal. Patient not taking: No sig reported    [provider]  colestipol (COLESTID) 1 g tablet Take 1 g by mouth 2 (two) times daily. 04/08/20   [provider]  cyanocobalamin (,VITAMIN B-12,) 1000 MCG/ML injection Inject  28ml IM once very 4 weeks then once a month for 4 months. Patient not taking: No sig reported 09/27/18   [provider]  cyanocobalamin 1000 MCG tablet Take 1,000 mcg by mouth daily.    [provider]  gabapentin (NEURONTIN) 300 MG capsule Take by mouth 3 (three) times daily.  08/03/17 10/15/18  [provider]  glipiZIDE (GLUCOTROL XL) 10 MG 24 hr tablet Take 10 mg by mouth at bedtime.    [provider]  glucose blood (ONETOUCH ULTRA) test strip Use 2 (two) times daily Dx E11.9 07/26/18   [provider]  hydrochlorothiazide (HYDRODIURIL) 12.5 MG tablet Take 12.5 mg by mouth daily.    [provider]  insulin aspart (NOVOLOG) 100 UNIT/ML injection Inject 26 Units into the skin AC breakfast. take 26 units before breakfast and 22 units before supper Patient not taking: No sig reported    [provider]  insulin aspart protamine - aspart (NOVOLOG 70/30 MIX) (70-30) 100 UNIT/ML FlexPen 42-52 Units. 42 units qam and 52 qhs 05/01/17   [provider]  Insulin Pen Needle (BD PEN NEEDLE NANO U/F) 32G X 4 MM MISC USE WITH INJECTIONS 2 TIMES DAILY 07/26/18   [provider]  lisinopril (PRINIVIL,ZESTRIL) 40 MG tablet Take 40 mg by mouth daily. Take 20 mg twice a day    [provider]  metFORMIN (GLUCOPHAGE-XR) 500 MG 24 hr tablet Take 1,000 mg by mouth 2 (two) times daily. Patient not taking: No sig reported    [provider]  metoprolol succinate (TOPROL-XL) 100 MG 24 hr tablet Take 100 mg by mouth daily. Take with or  immediately following a meal.    [provider]  pantoprazole (PROTONIX) 40 MG tablet Take 40 mg by mouth daily.    [provider]  pregabalin (LYRICA) 75 MG capsule Take 75 mg by mouth 2 (two) times daily. 05/05/20   [provider]     Vital Signs: BP 129/69 (BP Location: Left Arm)   Pulse (!) 109   Temp 98.7 F (37.1 C) (Axillary)   Resp 16   Ht 5\' 9"  (1.753 m)    SpO2 93%   BMI 32.52 kg/m   Physical Exam Vitals and nursing note reviewed.  Constitutional:      General: He is not in acute distress.    Comments: Intubated with sedation (weaning).  Pulmonary:     Effort: Pulmonary effort is normal. No respiratory distress.     Comments: Intubated with sedation (weaning). Skin:    General: Skin is warm and dry.  Neurological:     Mental Status: He is alert.     Comments: Intubated with sedation (weaning). Awake and follows simple commands. PERRL bilaterally. Right gaze deviation able to cross midline. Moving right side and LLE spontaneously, no movements in LUE. Distal pulses (DPs) palpable bilaterally.     Imaging: CT Head Wo Contrast  Result Date: 05/11/2020 CLINICAL DATA:  75 year old male status post code stroke presentation yesterday with right M3 occlusion status post endovascular revascularization. Contrast staining, subarachnoid contrast/blood intraoperatively. Subsequent encounter. EXAM: CT HEAD WITHOUT CONTRAST TECHNIQUE: Contiguous axial images were obtained from the base of the skull through the vertex without intravenous contrast. COMPARISON:  NIR flat panel head CT 1758 hours on 05/10/2020. Presentation plain head CT 0942 hours 05/10/2020. FINDINGS: Brain: Small volume subarachnoid hemorrhage along the right sylvian fissure (series 3, image 18), new from the presentation CT yesterday but regressed compared to 1758 hours yesterday. No associated intraventricular hemorrhage or ventriculomegaly. Normal basilar cisterns. No midline shift. Roughly 4 cm area of cytotoxic edema is evident now at the right frontal operculum on series 3, image 21. No intra-axial hemorrhage identified. Stable gray-white matter differentiation elsewhere. Vascular: Some residual intravascular contrast is suspected. Skull: No acute osseous abnormality identified. Sinuses/Orbits: Visualized paranasal sinuses and mastoids are stable and well aerated. Other:  Endotracheal tube partially visible in the oral cavity. No acute orbit or scalp soft tissue finding. IMPRESSION: 1. Small volume subarachnoid hemorrhage along the right Sylvian fissure is regressed compared to 1758 hours yesterday. 2. Cytotoxic edema now visible at the right frontal operculum. But no malignant hemorrhagic transformation. No significant intracranial mass effect. 3. Preliminary report of the above discussed by telephone with Dr. Quinn Axe on 05/11/2020 at 0552 hours. Electronically Signed   By: Genevie Ann M.D.   On: 05/11/2020 06:05   CT HEAD WO CONTRAST  Result Date: 05/10/2020 CLINICAL DATA:  Discoordination and slurred speech with fall EXAM: CT HEAD WITHOUT CONTRAST TECHNIQUE: Contiguous axial images were obtained from the base of the skull through the vertex without intravenous contrast. COMPARISON:  None. FINDINGS: Brain: There is mild diffuse atrophy. There is no appreciable intracranial mass, hemorrhage, extra-axial fluid collection, or midline shift. The brain parenchyma appears unremarkable. No acute infarct is demonstrable on this study. Vascular: There is no hyperdense vessel. There is slight calcification in the carotid siphon regions bilaterally. Skull: Bony calvarium appears intact. Sinuses/Orbits: There is mucosal thickening in the medial superior left maxillary antrum. There is slight mucosal thickening in several ethmoid air cells. Orbits appear symmetric bilaterally. Other: Visualized mastoid air cells clear. IMPRESSION:  Mild atrophy. Normal appearing brain parenchyma. No acute infarct demonstrated by CT. No mass or hemorrhage. Slight arterial vascular calcification noted. Areas of paranasal sinus mucosal thickening noted. Electronically Signed   By: Lowella Grip III M.D.   On: 05/10/2020 10:07   MR ANGIO HEAD WO CONTRAST  Result Date: 05/10/2020 CLINICAL DATA:  TIA. Dysarthria and slurred speech. Mildly weak on the left. EXAM: MRI HEAD WITHOUT CONTRAST MRA HEAD WITHOUT CONTRAST  TECHNIQUE: Multiplanar, multiecho pulse sequences of the brain and surrounding structures were obtained without intravenous contrast. Angiographic images of the head were obtained using MRA technique without contrast. COMPARISON:  CT head 05/10/2020 FINDINGS: MRI HEAD FINDINGS Brain: Acute infarct in the right insular cortex and right parietal operculum. There is FLAIR hyperintensity in the right MCA branches in this area due to slow flow or thrombosis. There is susceptibility in the right sylvian fissure compatible with thrombus in the right MCA branch. Mild atrophy. Mild chronic microvascular ischemic change in the white matter. 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 Negative for mass lesion. Vascular: Normal arterial flow voids at the base of brain. Susceptibility in the right sylvian fissure with FLAIR hyperintensity in the right MCA vessels compatible with slow flow or occlusion Skull and upper cervical spine: Negative Sinuses/Orbits: Mild mucosal edema paranasal sinuses. Bilateral cataract extraction Other: None MRA HEAD FINDINGS Internal carotid artery widely patent bilaterally without stenosis. Anterior cerebral arteries widely patent with mild irregularity. Left middle cerebral artery widely patent with mild irregularity. Right M1 segment patent. Occlusion of the superior division of the right M3 vessel. Inferior division right M2 widely patent. Posterior circulation normal without significant stenosis. Fetal origin right posterior cerebral artery. IMPRESSION: 1. 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 2. Mild chronic microvascular ischemic change in the white matter 3. 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   On: 05/10/2020 13:52   MR BRAIN WO CONTRAST  Result Date: 05/10/2020 CLINICAL DATA:  TIA. Dysarthria and slurred speech. Mildly weak on the left. EXAM: MRI HEAD WITHOUT CONTRAST MRA HEAD WITHOUT CONTRAST TECHNIQUE:  Multiplanar, multiecho pulse sequences of the brain and surrounding structures were obtained without intravenous contrast. Angiographic images of the head were obtained using MRA technique without contrast. COMPARISON:  CT head 05/10/2020 FINDINGS: MRI HEAD FINDINGS Brain: Acute infarct in the right insular cortex and right parietal operculum. There is FLAIR hyperintensity in the right MCA branches in this area due to slow flow or thrombosis. There is susceptibility in the right sylvian fissure compatible with thrombus in the right MCA branch. Mild atrophy. Mild chronic microvascular ischemic change in the white matter. 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 Negative for mass lesion. Vascular: Normal arterial flow voids at the base of brain. Susceptibility in the right sylvian fissure with FLAIR hyperintensity in the right MCA vessels compatible with slow flow or occlusion Skull and upper cervical spine: Negative Sinuses/Orbits: Mild mucosal edema paranasal sinuses. Bilateral cataract extraction Other: None MRA HEAD FINDINGS Internal carotid artery widely patent bilaterally without stenosis. Anterior cerebral arteries widely patent with mild irregularity. Left middle cerebral artery widely patent with mild irregularity. Right M1 segment patent. Occlusion of the superior division of the right M3 vessel. Inferior division right M2 widely patent. Posterior circulation normal without significant stenosis. Fetal origin right posterior cerebral artery. IMPRESSION: 1. 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 2. Mild chronic microvascular ischemic change in the white matter 3. Occlusion of the right  M3 branch superior division Electronically Signed   By: Franchot Gallo M.D.   On: 05/10/2020 13:52   CT CEREBRAL PERFUSION W CONTRAST  Addendum Date: 05/10/2020   ADDENDUM REPORT: 05/10/2020 15:20 ADDENDUM: These results were called by telephone at the time of interpretation on 05/10/2020 at 3:20 pm to  provider [facility] , who verbally acknowledged these results. Electronically Signed   By: Franchot Gallo M.D.   On: 05/10/2020 15:20   Result Date: 05/10/2020 CLINICAL DATA:  Stroke. EXAM: CT ANGIOGRAPHY HEAD AND NECK CT PERFUSION BRAIN TECHNIQUE: Multidetector CT imaging of the head and neck was performed using the standard protocol during bolus administration of intravenous contrast. Multiplanar CT image reconstructions and MIPs were obtained to evaluate the vascular anatomy. Carotid stenosis measurements (when applicable) are obtained utilizing NASCET criteria, using the distal internal carotid diameter as the denominator. Multiphase CT imaging of the brain was performed following IV bolus contrast injection. Subsequent parametric perfusion maps were calculated using RAPID software. CONTRAST:  115mL OMNIPAQUE IOHEXOL 350 MG/ML SOLN COMPARISON:  CT head 05/10/2020.  MRI and MRA head 05/10/2020 FINDINGS: CTA NECK FINDINGS Aortic arch: Standard branching. Imaged portion shows no evidence of aneurysm or dissection. No significant stenosis of the major arch vessel origins. Right carotid system: Mild atherosclerotic calcification right carotid bifurcation without significant stenosis. No irregularity or thrombus in the right carotid. Left carotid system: Mild atherosclerotic disease left carotid without stenosis or irregularity. Vertebral arteries: Both vertebral arteries widely patent. Skeleton: Disc degeneration and spurring. Foraminal narrowing due to spurring bilaterally at C5-6 and C6-7. No acute skeletal abnormality. Other neck: No mass or edema identified. Upper chest: Lung apices clear bilaterally. Review of the MIP images confirms the above findings CTA HEAD FINDINGS Anterior circulation: Cavernous carotid widely patent bilaterally. Anterior cerebral arteries widely patent. Left middle cerebral artery widely patent. Right M1 segment widely patent. There is occlusion of a large right M3 branch  superior division right MCA as noted on prior MRA. This appears acute. Posterior circulation: Normal posterior circulation. No stenosis or large vessel occlusion. Venous sinuses: Normal venous enhancement. Anatomic variants: None Review of the MIP images confirms the above findings CT Brain Perfusion Findings: ASPECTS: 10 CBF (<30%) Volume: 36mL Perfusion (Tmax>6.0s) volume: 2mL Mismatch Volume: 62mL Infarction Location:Right insula and right operculum. IMPRESSION: 1. Abnormal CT perfusion. 29 mm core infarct with additional 33 mL of surrounding penumbra in the right insula and operculum corresponding to restricted diffusion on MRI. 2. Occluded right superior branch of M3 which is a relatively large vessel compatible with an embolus. 3. No significant carotid or vertebral artery stenosis in the neck. Electronically Signed: By: Franchot Gallo M.D. On: 05/10/2020 15:15   DG CHEST PORT 1 VIEW  Result Date: 05/10/2020 CLINICAL DATA:  Endotracheal tube adjustment EXAM: PORTABLE CHEST 1 VIEW COMPARISON:  05/10/2020 FINDINGS: Single frontal view of the chest demonstrates endotracheal tube overlying tracheal air column, tip remains approximately 8.7 cm above carina. Recommend advancing 5-6 cm. Cardiac silhouette is stable. Hypoventilatory changes at the lung bases. No effusion or pneumothorax. IMPRESSION: 1. Endotracheal tube remains well above carina, recommend advancing 5-6 cm. 2. Bilateral lower lobe hypoventilatory changes. Electronically Signed   By: Randa Ngo M.D.   On: 05/10/2020 20:18   DG CHEST PORT 1 VIEW  Result Date: 05/10/2020 CLINICAL DATA:  Endotracheal tube present. EXAM: PORTABLE CHEST 1 VIEW COMPARISON:  Earlier this day. FINDINGS: The endotracheal tube is been retracted, the tip is now 9.7 cm above the carina. Re-expansion  of the right upper lobe in the interim. Low lung volumes with patchy bibasilar opacities, left greater than right. No visualized pneumothorax. Stable heart size and  mediastinal contours. IMPRESSION: 1. Endotracheal tube retracted, tip now 9.7 cm above the carina. Advancement of 5-6 cm would place the endotracheal tube tip at the level of the clavicular heads. 2. Re-expansion of the right upper lobe. 3. Low lung volumes with patchy bibasilar opacities, left greater than right, likely atelectasis. Electronically Signed   By: Keith Rake M.D.   On: 05/10/2020 20:06   Portable Chest x-ray  Result Date: 05/10/2020 CLINICAL DATA:  Intubated, stroke EXAM: PORTABLE CHEST 1 VIEW COMPARISON:  None. FINDINGS: Single frontal view of the chest demonstrates endotracheal tube overlying tracheal air column, tip approximately 1.5 cm above carina. There is dense right upper lobe consolidation with volume loss consistent with atelectasis. This could be due to mucous plugging. Hypoventilatory changes are seen at the left lung base. No effusion or pneumothorax. No acute bony abnormalities. IMPRESSION: 1. Endotracheal tube approximately 1.5 cm above carina. 2. Dense right upper lobe consolidation and volume loss consistent with atelectasis. This could be due to mucous plugging. 3. Hypoventilatory changes at the left lung base. These results were called by telephone at the time of interpretation on 05/10/2020 at 7:18 pm to provider DR Lucile Shutters , who verbally acknowledged these results. Electronically Signed   By: Randa Ngo M.D.   On: 05/10/2020 19:31   CT ANGIO HEAD CODE STROKE  Addendum Date: 05/10/2020   ADDENDUM REPORT: 05/10/2020 15:20 ADDENDUM: These results were called by telephone at the time of interpretation on 05/10/2020 at 3:20 pm to provider [facility] , who verbally acknowledged these results. Electronically Signed   By: Franchot Gallo M.D.   On: 05/10/2020 15:20   Result Date: 05/10/2020 CLINICAL DATA:  Stroke. EXAM: CT ANGIOGRAPHY HEAD AND NECK CT PERFUSION BRAIN TECHNIQUE: Multidetector CT imaging of the head and neck was performed using the standard protocol  during bolus administration of intravenous contrast. Multiplanar CT image reconstructions and MIPs were obtained to evaluate the vascular anatomy. Carotid stenosis measurements (when applicable) are obtained utilizing NASCET criteria, using the distal internal carotid diameter as the denominator. Multiphase CT imaging of the brain was performed following IV bolus contrast injection. Subsequent parametric perfusion maps were calculated using RAPID software. CONTRAST:  121mL OMNIPAQUE IOHEXOL 350 MG/ML SOLN COMPARISON:  CT head 05/10/2020.  MRI and MRA head 05/10/2020 FINDINGS: CTA NECK FINDINGS Aortic arch: Standard branching. Imaged portion shows no evidence of aneurysm or dissection. No significant stenosis of the major arch vessel origins. Right carotid system: Mild atherosclerotic calcification right carotid bifurcation without significant stenosis. No irregularity or thrombus in the right carotid. Left carotid system: Mild atherosclerotic disease left carotid without stenosis or irregularity. Vertebral arteries: Both vertebral arteries widely patent. Skeleton: Disc degeneration and spurring. Foraminal narrowing due to spurring bilaterally at C5-6 and C6-7. No acute skeletal abnormality. Other neck: No mass or edema identified. Upper chest: Lung apices clear bilaterally. Review of the MIP images confirms the above findings CTA HEAD FINDINGS Anterior circulation: Cavernous carotid widely patent bilaterally. Anterior cerebral arteries widely patent. Left middle cerebral artery widely patent. Right M1 segment widely patent. There is occlusion of a large right M3 branch superior division right MCA as noted on prior MRA. This appears acute. Posterior circulation: Normal posterior circulation. No stenosis or large vessel occlusion. Venous sinuses: Normal venous enhancement. Anatomic variants: None Review of the MIP images confirms the above findings  CT Brain Perfusion Findings: ASPECTS: 10 CBF (<30%) Volume: 34mL  Perfusion (Tmax>6.0s) volume: 61mL Mismatch Volume: 43mL Infarction Location:Right insula and right operculum. IMPRESSION: 1. Abnormal CT perfusion. 29 mm core infarct with additional 33 mL of surrounding penumbra in the right insula and operculum corresponding to restricted diffusion on MRI. 2. Occluded right superior branch of M3 which is a relatively large vessel compatible with an embolus. 3. No significant carotid or vertebral artery stenosis in the neck. Electronically Signed: By: Franchot Gallo M.D. On: 05/10/2020 15:15   CT ANGIO NECK CODE STROKE  Addendum Date: 05/10/2020   ADDENDUM REPORT: 05/10/2020 15:20 ADDENDUM: These results were called by telephone at the time of interpretation on 05/10/2020 at 3:20 pm to provider [facility] , who verbally acknowledged these results. Electronically Signed   By: Franchot Gallo M.D.   On: 05/10/2020 15:20   Result Date: 05/10/2020 CLINICAL DATA:  Stroke. EXAM: CT ANGIOGRAPHY HEAD AND NECK CT PERFUSION BRAIN TECHNIQUE: Multidetector CT imaging of the head and neck was performed using the standard protocol during bolus administration of intravenous contrast. Multiplanar CT image reconstructions and MIPs were obtained to evaluate the vascular anatomy. Carotid stenosis measurements (when applicable) are obtained utilizing NASCET criteria, using the distal internal carotid diameter as the denominator. Multiphase CT imaging of the brain was performed following IV bolus contrast injection. Subsequent parametric perfusion maps were calculated using RAPID software. CONTRAST:  145mL OMNIPAQUE IOHEXOL 350 MG/ML SOLN COMPARISON:  CT head 05/10/2020.  MRI and MRA head 05/10/2020 FINDINGS: CTA NECK FINDINGS Aortic arch: Standard branching. Imaged portion shows no evidence of aneurysm or dissection. No significant stenosis of the major arch vessel origins. Right carotid system: Mild atherosclerotic calcification right carotid bifurcation without significant stenosis. No  irregularity or thrombus in the right carotid. Left carotid system: Mild atherosclerotic disease left carotid without stenosis or irregularity. Vertebral arteries: Both vertebral arteries widely patent. Skeleton: Disc degeneration and spurring. Foraminal narrowing due to spurring bilaterally at C5-6 and C6-7. No acute skeletal abnormality. Other neck: No mass or edema identified. Upper chest: Lung apices clear bilaterally. Review of the MIP images confirms the above findings CTA HEAD FINDINGS Anterior circulation: Cavernous carotid widely patent bilaterally. Anterior cerebral arteries widely patent. Left middle cerebral artery widely patent. Right M1 segment widely patent. There is occlusion of a large right M3 branch superior division right MCA as noted on prior MRA. This appears acute. Posterior circulation: Normal posterior circulation. No stenosis or large vessel occlusion. Venous sinuses: Normal venous enhancement. Anatomic variants: None Review of the MIP images confirms the above findings CT Brain Perfusion Findings: ASPECTS: 10 CBF (<30%) Volume: 12mL Perfusion (Tmax>6.0s) volume: 21mL Mismatch Volume: 27mL Infarction Location:Right insula and right operculum. IMPRESSION: 1. Abnormal CT perfusion. 29 mm core infarct with additional 33 mL of surrounding penumbra in the right insula and operculum corresponding to restricted diffusion on MRI. 2. Occluded right superior branch of M3 which is a relatively large vessel compatible with an embolus. 3. No significant carotid or vertebral artery stenosis in the neck. Electronically Signed: By: Franchot Gallo M.D. On: 05/10/2020 15:15    Labs:  CBC: Recent Labs    05/10/20 0940 05/10/20 1946 05/11/20 0405  WBC 9.0  --  17.2*  HGB 14.6 13.6 12.7*  HCT 42.6 40.0 38.8*  PLT 270  --  269    COAGS: Recent Labs    05/10/20 0940  INR 1.0  APTT 24    BMP: Recent Labs    05/10/20 0940  05/10/20 1946 05/11/20 0405  NA 139 140 138  K 3.7 4.0 4.2   CL 103  --  102  CO2 26  --  21*  GLUCOSE 207*  --  409*  BUN 31*  --  28*  CALCIUM 9.7  --  8.7*  CREATININE 2.26*  --  2.26*  GFRNONAA 30*  --  30*    LIVER FUNCTION TESTS: Recent Labs    05/10/20 0940 05/11/20 0405  BILITOT 0.8 0.8  AST 20 15  ALT 16 15  ALKPHOS 45 47  PROT 7.2 6.1*  ALBUMIN 4.1 3.4*    Assessment and Plan:  History of acute CVA s/p cerebral arteriogram with emergent mechanical thrombectomy of right MCA superior division occlusion achieving a TICI 2c revascularization via right femoral approach 05/10/2020 by Dr. Estanislado Pandy. Dr. Estanislado Pandy at bedside, discussed procedure with wife/brother. All questions answered and concerns addressed. Patient's condition stable- remains intubated/sedated (weaning) but awake and follows simple commands, right gaze deviation able to cross midline, moving right side and LLE but no movements of LUE. Right femoral puncture site stable, distal pulses (DPs) palpable bilaterally. For MR/MRA brain/head and possible extubation today. Further plans per neurology/CCM- appreciate and agree with management.  NIR to follow.   Electronically Signed: Earley Abide, PA-C 05/11/2020, 10:38 AM   I spent a total of 15 Minutes at the the patient's bedside AND on the patient's [facility] floor or unit, greater than 50% of which was counseling/coordinating care for CVA s/p revascularization.

## 2020-05-11 NOTE — Progress Notes (Signed)
Inpatient Diabetes Program Recommendations  AACE/ADA: New Consensus Statement on Inpatient Glycemic Control  Target Ranges:  Prepandial:   less than 140 mg/dL      Peak postprandial:   less than 180 mg/dL (1-2 hours)      Critically ill patients:  140 - 180 mg/dL  Results for Thomas [facility], Thomas [facility] (MRN [id number]) as of 05/11/2020 09:25  Ref. Range 05/10/2020 14:32 05/10/2020 19:23 05/10/2020 23:46 05/11/2020 03:36 05/11/2020 07:20  Glucose-Capillary Latest Ref Range: 70 - 99 mg/dL 291 (H) 292 (H) 386 (H) 390 (H) 341 (H)    Review of Glycemic Control  Diabetes history: DM2 Outpatient Diabetes medications: Glipizide XL 10 mg QHS, 70/30 42 units QAM, 70/30 52 units QPM, Novolog 26 units with breakfast, 26 units with lunch, and 22 units with supper Current orders for Inpatient glycemic control: Lantus 15 units daily, Novolog 0-15 units Q4H  Inpatient Diabetes Program Recommendations:    Insulin: Noted Lantus 15 units daily ordered and started today (already given). Please consider increasing Lantus to 20 units BID to start this evening and increasing Novolog to 0-20 units Q4H.  NOTE: In reviewing chart, noted patient sees Dr. Gabriel Carina (Endocrinology) and was last seen on 05/07/20. Per office note on 05/07/20, patient was told to increase 70/30 to 42 units QAM and 52 units QPM. Once patient uses up 70/30 on hand, he was asked to start Tresiba 54 units daily and Novolog 20-35 units BID with meals (dose based on glucose).   Thanks, Barnie Alderman, RN, MSN, CDE Diabetes Coordinator Inpatient Diabetes Program [phone] (Team Pager from 8am to 5pm)

## 2020-05-11 NOTE — Progress Notes (Addendum)
STROKE TEAM PROGRESS NOTE   INTERVAL HISTORY His family including wife is at the bedside.  Patient underwent IR thrombectomy yesterday afternoon after transfer from [place]. This AM patient remains intubated but is awake and interactive and is resting in bed patient had hand mittens on, but did well after they were removed. Patient remains intubated but will try to extubate patient today. Patient is more alert and able to communicate with head nodding and shaking. Patient denies any pain. Patient family confirms that the patient had aphasia and he has been gazing towards the right more. Patient denies pain at IR incision site. Repeat CT scan of the head this morning shows a larger right frontal infarct with cytotoxic edema and trace subarachnoid hemorrhage which appears improved from last night's postprocedure CT. Vitals:   05/11/20 0728 05/11/20 0730 05/11/20 0745 05/11/20 0800  BP:  (!) 142/68 139/63 129/69  Pulse: (!) 109 97 95 90  Resp: 17 17 17 15   Temp:    98.7 F (37.1 C)  TempSrc:    Axillary  SpO2: 97% 96% 97% 97%  Height:       CBC:  Recent Labs  Lab 05/10/20 0940 05/10/20 1946 05/11/20 0405  WBC 9.0  --  17.2*  NEUTROABS 6.5  --  15.3*  HGB 14.6 13.6 12.7*  HCT 42.6 40.0 38.8*  MCV 92.2  --  95.1  PLT 270  --  350   Basic Metabolic Panel:  Recent Labs  Lab 05/10/20 0940 05/10/20 1946 05/11/20 0405  NA 139 140 138  K 3.7 4.0 4.2  CL 103  --  102  CO2 26  --  21*  GLUCOSE 207*  --  409*  BUN 31*  --  28*  CREATININE 2.26*  --  2.26*  CALCIUM 9.7  --  8.7*   Lipid Panel:  Recent Labs  Lab 05/11/20 0405  CHOL 166  TRIG 208*  204*  HDL 33*  CHOLHDL 5.0  VLDL 42*  LDLCALC 91   HgbA1c:  Recent Labs  Lab 05/11/20 0405  HGBA1C 8.2*   Urine Drug Screen: No results for input(s): LABOPIA, COCAINSCRNUR, LABBENZ, AMPHETMU, THCU, LABBARB in the last 168 hours.  Alcohol Level No results for input(s): ETH in the last 168 hours.  IMAGING past 24 hours CT  Head Wo Contrast  Result Date: 05/11/2020 CLINICAL DATA:  75 year old male status post code stroke presentation yesterday with right M3 occlusion status post endovascular revascularization. Contrast staining, subarachnoid contrast/blood intraoperatively. Subsequent encounter. EXAM: CT HEAD WITHOUT CONTRAST TECHNIQUE: Contiguous axial images were obtained from the base of the skull through the vertex without intravenous contrast. COMPARISON:  NIR flat panel head CT 1758 hours on 05/10/2020. Presentation plain head CT 0942 hours 05/10/2020. FINDINGS: Brain: Small volume subarachnoid hemorrhage along the right sylvian fissure (series 3, image 18), new from the presentation CT yesterday but regressed compared to 1758 hours yesterday. No associated intraventricular hemorrhage or ventriculomegaly. Normal basilar cisterns. No midline shift. Roughly 4 cm area of cytotoxic edema is evident now at the right frontal operculum on series 3, image 21. No intra-axial hemorrhage identified. Stable gray-white matter differentiation elsewhere. Vascular: Some residual intravascular contrast is suspected. Skull: No acute osseous abnormality identified. Sinuses/Orbits: Visualized paranasal sinuses and mastoids are stable and well aerated. Other: Endotracheal tube partially visible in the oral cavity. No acute orbit or scalp soft tissue finding. IMPRESSION: 1. Small volume subarachnoid hemorrhage along the right Sylvian fissure is regressed compared to 1758 hours yesterday. 2. Cytotoxic  edema now visible at the right frontal operculum. But no malignant hemorrhagic transformation. No significant intracranial mass effect. 3. Preliminary report of the above discussed by telephone with Dr. Quinn Axe on 05/11/2020 at 0552 hours. Electronically Signed   By: Genevie Ann M.D.   On: 05/11/2020 06:05   MR ANGIO HEAD WO CONTRAST  Result Date: 05/10/2020 CLINICAL DATA:  TIA. Dysarthria and slurred speech. Mildly weak on the left. EXAM: MRI HEAD WITHOUT  CONTRAST MRA HEAD WITHOUT CONTRAST TECHNIQUE: Multiplanar, multiecho pulse sequences of the brain and surrounding structures were obtained without intravenous contrast. Angiographic images of the head were obtained using MRA technique without contrast. COMPARISON:  CT head 05/10/2020 FINDINGS: MRI HEAD FINDINGS Brain: Acute infarct in the right insular cortex and right parietal operculum. There is FLAIR hyperintensity in the right MCA branches in this area due to slow flow or thrombosis. There is susceptibility in the right sylvian fissure compatible with thrombus in the right MCA branch. Mild atrophy. Mild chronic microvascular ischemic change in the white matter. 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 Negative for mass lesion. Vascular: Normal arterial flow voids at the base of brain. Susceptibility in the right sylvian fissure with FLAIR hyperintensity in the right MCA vessels compatible with slow flow or occlusion Skull and upper cervical spine: Negative Sinuses/Orbits: Mild mucosal edema paranasal sinuses. Bilateral cataract extraction Other: None MRA HEAD FINDINGS Internal carotid artery widely patent bilaterally without stenosis. Anterior cerebral arteries widely patent with mild irregularity. Left middle cerebral artery widely patent with mild irregularity. Right M1 segment patent. Occlusion of the superior division of the right M3 vessel. Inferior division right M2 widely patent. Posterior circulation normal without significant stenosis. Fetal origin right posterior cerebral artery. IMPRESSION: 1. 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 2. Mild chronic microvascular ischemic change in the white matter 3. 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   On: 05/10/2020 13:52   MR BRAIN WO CONTRAST  Result Date: 05/10/2020 CLINICAL DATA:  TIA. Dysarthria and slurred speech. Mildly weak on the left. EXAM: MRI HEAD WITHOUT CONTRAST MRA  HEAD WITHOUT CONTRAST TECHNIQUE: Multiplanar, multiecho pulse sequences of the brain and surrounding structures were obtained without intravenous contrast. Angiographic images of the head were obtained using MRA technique without contrast. COMPARISON:  CT head 05/10/2020 FINDINGS: MRI HEAD FINDINGS Brain: Acute infarct in the right insular cortex and right parietal operculum. There is FLAIR hyperintensity in the right MCA branches in this area due to slow flow or thrombosis. There is susceptibility in the right sylvian fissure compatible with thrombus in the right MCA branch. Mild atrophy. Mild chronic microvascular ischemic change in the white matter. 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 Negative for mass lesion. Vascular: Normal arterial flow voids at the base of brain. Susceptibility in the right sylvian fissure with FLAIR hyperintensity in the right MCA vessels compatible with slow flow or occlusion Skull and upper cervical spine: Negative Sinuses/Orbits: Mild mucosal edema paranasal sinuses. Bilateral cataract extraction Other: None MRA HEAD FINDINGS Internal carotid artery widely patent bilaterally without stenosis. Anterior cerebral arteries widely patent with mild irregularity. Left middle cerebral artery widely patent with mild irregularity. Right M1 segment patent. Occlusion of the superior division of the right M3 vessel. Inferior division right M2 widely patent. Posterior circulation normal without significant stenosis. Fetal origin right posterior cerebral artery. IMPRESSION: 1. 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 2. Mild chronic microvascular ischemic change in the white  matter 3. 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   On: 05/10/2020 13:52   CT CEREBRAL PERFUSION W CONTRAST  Addendum Date: 05/10/2020   ADDENDUM REPORT: 05/10/2020 15:20 ADDENDUM: These results were called by telephone at the time of interpretation  on 05/10/2020 at 3:20 pm to provider [facility] , who verbally acknowledged these results. Electronically Signed   By: Franchot Gallo M.D.   On: 05/10/2020 15:20   Result Date: 05/10/2020 CLINICAL DATA:  Stroke. EXAM: CT ANGIOGRAPHY HEAD AND NECK CT PERFUSION BRAIN TECHNIQUE: Multidetector CT imaging of the head and neck was performed using the standard protocol during bolus administration of intravenous contrast. Multiplanar CT image reconstructions and MIPs were obtained to evaluate the vascular anatomy. Carotid stenosis measurements (when applicable) are obtained utilizing NASCET criteria, using the distal internal carotid diameter as the denominator. Multiphase CT imaging of the brain was performed following IV bolus contrast injection. Subsequent parametric perfusion maps were calculated using RAPID software. CONTRAST:  125mL OMNIPAQUE IOHEXOL 350 MG/ML SOLN COMPARISON:  CT head 05/10/2020.  MRI and MRA head 05/10/2020 FINDINGS: CTA NECK FINDINGS Aortic arch: Standard branching. Imaged portion shows no evidence of aneurysm or dissection. No significant stenosis of the major arch vessel origins. Right carotid system: Mild atherosclerotic calcification right carotid bifurcation without significant stenosis. No irregularity or thrombus in the right carotid. Left carotid system: Mild atherosclerotic disease left carotid without stenosis or irregularity. Vertebral arteries: Both vertebral arteries widely patent. Skeleton: Disc degeneration and spurring. Foraminal narrowing due to spurring bilaterally at C5-6 and C6-7. No acute skeletal abnormality. Other neck: No mass or edema identified. Upper chest: Lung apices clear bilaterally. Review of the MIP images confirms the above findings CTA HEAD FINDINGS Anterior circulation: Cavernous carotid widely patent bilaterally. Anterior cerebral arteries widely patent. Left middle cerebral artery widely patent. Right M1 segment widely patent. There is occlusion of a  large right M3 branch superior division right MCA as noted on prior MRA. This appears acute. Posterior circulation: Normal posterior circulation. No stenosis or large vessel occlusion. Venous sinuses: Normal venous enhancement. Anatomic variants: None Review of the MIP images confirms the above findings CT Brain Perfusion Findings: ASPECTS: 10 CBF (<30%) Volume: 30mL Perfusion (Tmax>6.0s) volume: 25mL Mismatch Volume: 20mL Infarction Location:Right insula and right operculum. IMPRESSION: 1. Abnormal CT perfusion. 29 mm core infarct with additional 33 mL of surrounding penumbra in the right insula and operculum corresponding to restricted diffusion on MRI. 2. Occluded right superior branch of M3 which is a relatively large vessel compatible with an embolus. 3. No significant carotid or vertebral artery stenosis in the neck. Electronically Signed: By: Franchot Gallo M.D. On: 05/10/2020 15:15   DG CHEST PORT 1 VIEW  Result Date: 05/10/2020 CLINICAL DATA:  Endotracheal tube adjustment EXAM: PORTABLE CHEST 1 VIEW COMPARISON:  05/10/2020 FINDINGS: Single frontal view of the chest demonstrates endotracheal tube overlying tracheal air column, tip remains approximately 8.7 cm above carina. Recommend advancing 5-6 cm. Cardiac silhouette is stable. Hypoventilatory changes at the lung bases. No effusion or pneumothorax. IMPRESSION: 1. Endotracheal tube remains well above carina, recommend advancing 5-6 cm. 2. Bilateral lower lobe hypoventilatory changes. Electronically Signed   By: Randa Ngo M.D.   On: 05/10/2020 20:18   DG CHEST PORT 1 VIEW  Result Date: 05/10/2020 CLINICAL DATA:  Endotracheal tube present. EXAM: PORTABLE CHEST 1 VIEW COMPARISON:  Earlier this day. FINDINGS: The endotracheal tube is been retracted, the tip is now  9.7 cm above the carina. Re-expansion of the right upper lobe in the interim. Low lung volumes with patchy bibasilar opacities, left greater than right. No visualized pneumothorax. Stable  heart size and mediastinal contours. IMPRESSION: 1. Endotracheal tube retracted, tip now 9.7 cm above the carina. Advancement of 5-6 cm would place the endotracheal tube tip at the level of the clavicular heads. 2. Re-expansion of the right upper lobe. 3. Low lung volumes with patchy bibasilar opacities, left greater than right, likely atelectasis. Electronically Signed   By: Keith Rake M.D.   On: 05/10/2020 20:06   Portable Chest x-ray  Result Date: 05/10/2020 CLINICAL DATA:  Intubated, stroke EXAM: PORTABLE CHEST 1 VIEW COMPARISON:  None. FINDINGS: Single frontal view of the chest demonstrates endotracheal tube overlying tracheal air column, tip approximately 1.5 cm above carina. There is dense right upper lobe consolidation with volume loss consistent with atelectasis. This could be due to mucous plugging. Hypoventilatory changes are seen at the left lung base. No effusion or pneumothorax. No acute bony abnormalities. IMPRESSION: 1. Endotracheal tube approximately 1.5 cm above carina. 2. Dense right upper lobe consolidation and volume loss consistent with atelectasis. This could be due to mucous plugging. 3. Hypoventilatory changes at the left lung base. These results were called by telephone at the time of interpretation on 05/10/2020 at 7:18 pm to provider DR Lucile Shutters , who verbally acknowledged these results. Electronically Signed   By: Randa Ngo M.D.   On: 05/10/2020 19:31   CT ANGIO HEAD CODE STROKE  Addendum Date: 05/10/2020   ADDENDUM REPORT: 05/10/2020 15:20 ADDENDUM: These results were called by telephone at the time of interpretation on 05/10/2020 at 3:20 pm to provider [facility] , who verbally acknowledged these results. Electronically Signed   By: Franchot Gallo M.D.   On: 05/10/2020 15:20   Result Date: 05/10/2020 CLINICAL DATA:  Stroke. EXAM: CT ANGIOGRAPHY HEAD AND NECK CT PERFUSION BRAIN TECHNIQUE: Multidetector CT imaging of the head and neck was performed using the  standard protocol during bolus administration of intravenous contrast. Multiplanar CT image reconstructions and MIPs were obtained to evaluate the vascular anatomy. Carotid stenosis measurements (when applicable) are obtained utilizing NASCET criteria, using the distal internal carotid diameter as the denominator. Multiphase CT imaging of the brain was performed following IV bolus contrast injection. Subsequent parametric perfusion maps were calculated using RAPID software. CONTRAST:  174mL OMNIPAQUE IOHEXOL 350 MG/ML SOLN COMPARISON:  CT head 05/10/2020.  MRI and MRA head 05/10/2020 FINDINGS: CTA NECK FINDINGS Aortic arch: Standard branching. Imaged portion shows no evidence of aneurysm or dissection. No significant stenosis of the major arch vessel origins. Right carotid system: Mild atherosclerotic calcification right carotid bifurcation without significant stenosis. No irregularity or thrombus in the right carotid. Left carotid system: Mild atherosclerotic disease left carotid without stenosis or irregularity. Vertebral arteries: Both vertebral arteries widely patent. Skeleton: Disc degeneration and spurring. Foraminal narrowing due to spurring bilaterally at C5-6 and C6-7. No acute skeletal abnormality. Other neck: No mass or edema identified. Upper chest: Lung apices clear bilaterally. Review of the MIP images confirms the above findings CTA HEAD FINDINGS Anterior circulation: Cavernous carotid widely patent bilaterally. Anterior cerebral arteries widely patent. Left middle cerebral artery widely patent. Right M1 segment widely patent. There is occlusion of a large right M3 branch superior division right MCA as noted on prior MRA. This appears acute. Posterior circulation: Normal posterior circulation. No stenosis or large vessel occlusion. Venous sinuses: Normal venous enhancement. Anatomic variants: None Review of the  MIP images confirms the above findings CT Brain Perfusion Findings: ASPECTS: 10 CBF (<30%)  Volume: 7mL Perfusion (Tmax>6.0s) volume: 97mL Mismatch Volume: 54mL Infarction Location:Right insula and right operculum. IMPRESSION: 1. Abnormal CT perfusion. 29 mm core infarct with additional 33 mL of surrounding penumbra in the right insula and operculum corresponding to restricted diffusion on MRI. 2. Occluded right superior branch of M3 which is a relatively large vessel compatible with an embolus. 3. No significant carotid or vertebral artery stenosis in the neck. Electronically Signed: By: Franchot Gallo M.D. On: 05/10/2020 15:15   CT ANGIO NECK CODE STROKE  Addendum Date: 05/10/2020   ADDENDUM REPORT: 05/10/2020 15:20 ADDENDUM: These results were called by telephone at the time of interpretation on 05/10/2020 at 3:20 pm to provider [facility] , who verbally acknowledged these results. Electronically Signed   By: Franchot Gallo M.D.   On: 05/10/2020 15:20   Result Date: 05/10/2020 CLINICAL DATA:  Stroke. EXAM: CT ANGIOGRAPHY HEAD AND NECK CT PERFUSION BRAIN TECHNIQUE: Multidetector CT imaging of the head and neck was performed using the standard protocol during bolus administration of intravenous contrast. Multiplanar CT image reconstructions and MIPs were obtained to evaluate the vascular anatomy. Carotid stenosis measurements (when applicable) are obtained utilizing NASCET criteria, using the distal internal carotid diameter as the denominator. Multiphase CT imaging of the brain was performed following IV bolus contrast injection. Subsequent parametric perfusion maps were calculated using RAPID software. CONTRAST:  150mL OMNIPAQUE IOHEXOL 350 MG/ML SOLN COMPARISON:  CT head 05/10/2020.  MRI and MRA head 05/10/2020 FINDINGS: CTA NECK FINDINGS Aortic arch: Standard branching. Imaged portion shows no evidence of aneurysm or dissection. No significant stenosis of the major arch vessel origins. Right carotid system: Mild atherosclerotic calcification right carotid bifurcation without significant  stenosis. No irregularity or thrombus in the right carotid. Left carotid system: Mild atherosclerotic disease left carotid without stenosis or irregularity. Vertebral arteries: Both vertebral arteries widely patent. Skeleton: Disc degeneration and spurring. Foraminal narrowing due to spurring bilaterally at C5-6 and C6-7. No acute skeletal abnormality. Other neck: No mass or edema identified. Upper chest: Lung apices clear bilaterally. Review of the MIP images confirms the above findings CTA HEAD FINDINGS Anterior circulation: Cavernous carotid widely patent bilaterally. Anterior cerebral arteries widely patent. Left middle cerebral artery widely patent. Right M1 segment widely patent. There is occlusion of a large right M3 branch superior division right MCA as noted on prior MRA. This appears acute. Posterior circulation: Normal posterior circulation. No stenosis or large vessel occlusion. Venous sinuses: Normal venous enhancement. Anatomic variants: None Review of the MIP images confirms the above findings CT Brain Perfusion Findings: ASPECTS: 10 CBF (<30%) Volume: 38mL Perfusion (Tmax>6.0s) volume: 22mL Mismatch Volume: 55mL Infarction Location:Right insula and right operculum. IMPRESSION: 1. Abnormal CT perfusion. 29 mm core infarct with additional 33 mL of surrounding penumbra in the right insula and operculum corresponding to restricted diffusion on MRI. 2. Occluded right superior branch of M3 which is a relatively large vessel compatible with an embolus. 3. No significant carotid or vertebral artery stenosis in the neck. Electronically Signed: By: Franchot Gallo M.D. On: 05/10/2020 15:15    PHYSICAL EXAM Pleasant elderly Caucasian male who is intubated not sedated.  . Afebrile. Head is nontraumatic. Neck is supple without bruit.    Cardiac exam no murmur or gallop. Lungs are clear to auscultation. Distal pulses are well felt. Neurological Exam :   Patient is intubated.  Not sedated.  Awake oriented to  person, place, time,  and situation as patient is able to correctly nod yes when given multiple choice to answer the above questions. Patient is aware that it is day time. Patient is awake alert his head is more midline today, but he does have preference for the right.  Unable to assess speech as patient is intubated, but patient does move his lips around the tube.  He follows all commands well. 52mm pupils, patient comes to midline but does not look towards the left beyond midline. Blinks to threat on the right but not on the left.  He has moderate left lower facial weakness, with facial droop.  Tongue midline. No shoulder shrug on the Left. Obvious LLE weakness 3-/5 with drift. RLE 3+/5 RUE 5/5. LUE 2/5 with strength in proximal muscles, but no tone in distal arm. Light Touch intact in RUE and BLE but not intact to LUE.  He does not have left hemibody neglect.   Plantars left equal to right downgoing. Extensor is weak in the LLE. Some dysmetria in the R, unable to test L FTN 2/2 weakness. Gait not tested  ASSESSMENT/PLAN Verlene Mayer Zacharyis a 75 y.o.malewith medical history significant ofhypertension, hyperlipidemia, diabetes mellitus, GERD, CKD-4, kidney stone, claudication, who [facility] to [facility] with left-sided weakness, slurred speech upon arising from sleep at 7:30 AM, 05/10/2020. Patient symptoms initial improved upon arrival to [facility]; MRI concluded that patient had a acute right insular and parietal operculum infarct and MRA showed occlusion of the right M3 superior division branch which was quite large. Patient NIHHSS worsened to a 10 and CT Perfusion suggested patient was a candidate for IR thrombectomy, patient was transferred to [facility] for procedure. Procudre successfully revascularized the R MCA. Patient appears to have responded well to procedure as he is appears to improvements in his left sided weakness with mild improvement in his gaze preference. Repeat CT noted that  cytotoxic edema is now visible but there is no malignant transformation and no significant intracranial mass affect. Will attempt to extubate patient today and do MRI tom for temporal analysis of cytotoxic tissue.  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 Normal appearing brain parenchyma. No acute infarct demonstrated by CT. No mass or hemorrhage.  Slight arterial vascular calcification noted. Areas of paranasal sinus mucosal thickening noted. - CTA: completed -CT perfusion: Abnormal CT perfusion. 29 mm core infarct with additional 33 mL of surrounding penumbra in the right insula and operculum corresponding to restricted diffusion on MRI.  Occluded right superior branch of M3 which is a relatively large vessel compatible with an embolus.   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  MRA : Occlusion of the right M3 branch superior division  Repeat MRI and MRA 4/13  Will attempt extubation today  2D Echo pending  LDL 91  HgbA1c 8.2  VTE prophylaxis - SCDs    Diet   Diet NPO time specified     aspirin 81 mg daily prior to admission, now on No antithrombotic. Contraindicated still in 24 thrombectomy window pending MRI results as well.  Therapy recommendations:  PT/OT  Disposition:  Pending  Hypertension  Home meds:  Amlodipine 2.5mg  daily, lisinopril 40mg , HCTZ 12.5 daily, metoprolol 100mg  . BP goal remains 120-156mmgHg sys . On Celviprex  Hyperlipidemia  LDL 91, goal < 70  S/p CVA will start Atorvastatin 40mg  daily  Continue statin at discharge  Diabetes type II   Home meds:  Glipizide 10mg , metformin 1000mg  BID, Novolog 26 U ac breakfast an 22 U ac supper  HgbA1c 8.2, goal < 7.0  CBGs Recent Labs    05/10/20 2346 05/11/20 0336 05/11/20 0720  GLUCAP 386* 390* 341*      SSI  Other Stroke Risk  Factors  Advanced Age >/= 14    Obesity, Body mass index is 32.52 kg/m., BMI >/= 30 associated with increased stroke risk, recommend weight loss, diet and exercise as appropriate    Hospital day # 1  Damita Dunnings, MD PGY-1 I have personally obtained history,examined this patient, reviewed notes, independently viewed imaging studies, participated in medical decision making and plan of care.ROS completed by me personally and pertinent positives fully documented  I have made any additions or clarifications directly to the above note. Agree with note above.  Patient was transferred yesterday from [facility] with superior division right M2 occlusion with worsening neurological exam requiring emergent mechanical thrombectomy with TICI 2c revascularization.  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  Plan to extubate today as tolerated.  Check MRI scan of the brain and MRA of the brain later today when stable.  Continue close neurological monitoring and strict blood pressure control as per post intervention protocol.  Continue ongoing stroke work-up to determine etiology.  Long discussion with the patient as well as family at the bedside and answered questions about his care.  Discussed with Dr. Kipp Brood critical care medicine and Dr. Estanislado Pandy. This patient is critically ill and at significant risk of neurological worsening, death and care requires constant monitoring of vital signs, hemodynamics,respiratory and cardiac monitoring, extensive review of multiple databases, frequent neurological assessment, discussion with family, other specialists and medical decision making of high complexity.I have made any additions or clarifications directly to the above note.This critical care time does not reflect procedure time, or teaching time or supervisory time of PA/NP/Med Resident etc but could involve care discussion time.  I spent 30 minutes of  neurocritical care time  in the care of  this patient.      Antony Contras, MD Medical Director [facility] Pager: [phone] 05/11/2020 4:14 PM  To contact Stroke Continuity provider, please refer to [email or web address] After hours, contact General Neurology

## 2020-05-11 NOTE — Progress Notes (Signed)
225 cc fentanyl drip wasted in stericycle with Tillie RungCounselling psychologist)

## 2020-05-12 ENCOUNTER — Inpatient Hospital Stay (HOSPITAL_COMMUNITY): Payer: Medicare Other

## 2020-05-12 DIAGNOSIS — Z9889 Other specified postprocedural states: Secondary | ICD-10-CM

## 2020-05-12 DIAGNOSIS — I63311 Cerebral infarction due to thrombosis of right middle cerebral artery: Secondary | ICD-10-CM | POA: Diagnosis not present

## 2020-05-12 DIAGNOSIS — I6601 Occlusion and stenosis of right middle cerebral artery: Secondary | ICD-10-CM | POA: Diagnosis not present

## 2020-05-12 DIAGNOSIS — Z978 Presence of other specified devices: Secondary | ICD-10-CM | POA: Diagnosis not present

## 2020-05-12 DIAGNOSIS — M7989 Other specified soft tissue disorders: Secondary | ICD-10-CM

## 2020-05-12 DIAGNOSIS — L538 Other specified erythematous conditions: Secondary | ICD-10-CM

## 2020-05-12 LAB — CBC
HCT: 35.6 % — ABNORMAL LOW (ref 39.0–52.0)
Hemoglobin: 11.6 g/dL — ABNORMAL LOW (ref 13.0–17.0)
MCH: 31.4 pg (ref 26.0–34.0)
MCHC: 32.6 g/dL (ref 30.0–36.0)
MCV: 96.2 fL (ref 80.0–100.0)
Platelets: 241 10*3/uL (ref 150–400)
RBC: 3.7 MIL/uL — ABNORMAL LOW (ref 4.22–5.81)
RDW: 13.8 % (ref 11.5–15.5)
WBC: 15.2 10*3/uL — ABNORMAL HIGH (ref 4.0–10.5)
nRBC: 0 % (ref 0.0–0.2)

## 2020-05-12 LAB — GLUCOSE, CAPILLARY
Glucose-Capillary: 191 mg/dL — ABNORMAL HIGH (ref 70–99)
Glucose-Capillary: 240 mg/dL — ABNORMAL HIGH (ref 70–99)
Glucose-Capillary: 232 mg/dL — ABNORMAL HIGH (ref 70–99)
Glucose-Capillary: 141 mg/dL — ABNORMAL HIGH (ref 70–99)
Glucose-Capillary: 239 mg/dL — ABNORMAL HIGH (ref 70–99)
Glucose-Capillary: 242 mg/dL — ABNORMAL HIGH (ref 70–99)

## 2020-05-12 LAB — COMPREHENSIVE METABOLIC PANEL
ALT: 11 U/L (ref 0–44)
AST: 15 U/L (ref 15–41)
Albumin: 3.3 g/dL — ABNORMAL LOW (ref 3.5–5.0)
Alkaline Phosphatase: 43 U/L (ref 38–126)
Anion gap: 8 (ref 5–15)
BUN: 27 mg/dL — ABNORMAL HIGH (ref 8–23)
CO2: 26 mmol/L (ref 22–32)
Calcium: 8.3 mg/dL — ABNORMAL LOW (ref 8.9–10.3)
Chloride: 111 mmol/L (ref 98–111)
Creatinine, Ser: 2.05 mg/dL — ABNORMAL HIGH (ref 0.61–1.24)
GFR, Estimated: 33 mL/min — ABNORMAL LOW (ref >60)
Glucose, Bld: 213 mg/dL — ABNORMAL HIGH (ref 70–99)
Potassium: 3.5 mmol/L (ref 3.5–5.1)
Sodium: 145 mmol/L (ref 135–145)
Total Bilirubin: 0.9 mg/dL (ref 0.3–1.2)
Total Protein: 5.7 g/dL — ABNORMAL LOW (ref 6.5–8.1)

## 2020-05-12 LAB — ECHOCARDIOGRAM COMPLETE
Area-P 1/2: 4.21 cm2
Height: 69 in
S' Lateral: 2 cm
Weight: 3523.83 oz

## 2020-05-12 LAB — MAGNESIUM: Magnesium: 1.5 mg/dL — ABNORMAL LOW (ref 1.7–2.4)

## 2020-05-12 IMAGING — DX DG CHEST 1V PORT
1 series · 1 of 1 positions shown · non-contrast
Comparison: [DATE]

CLINICAL DATA: Aspiration

EXAM:
PORTABLE CHEST 1 VIEW

[chest]
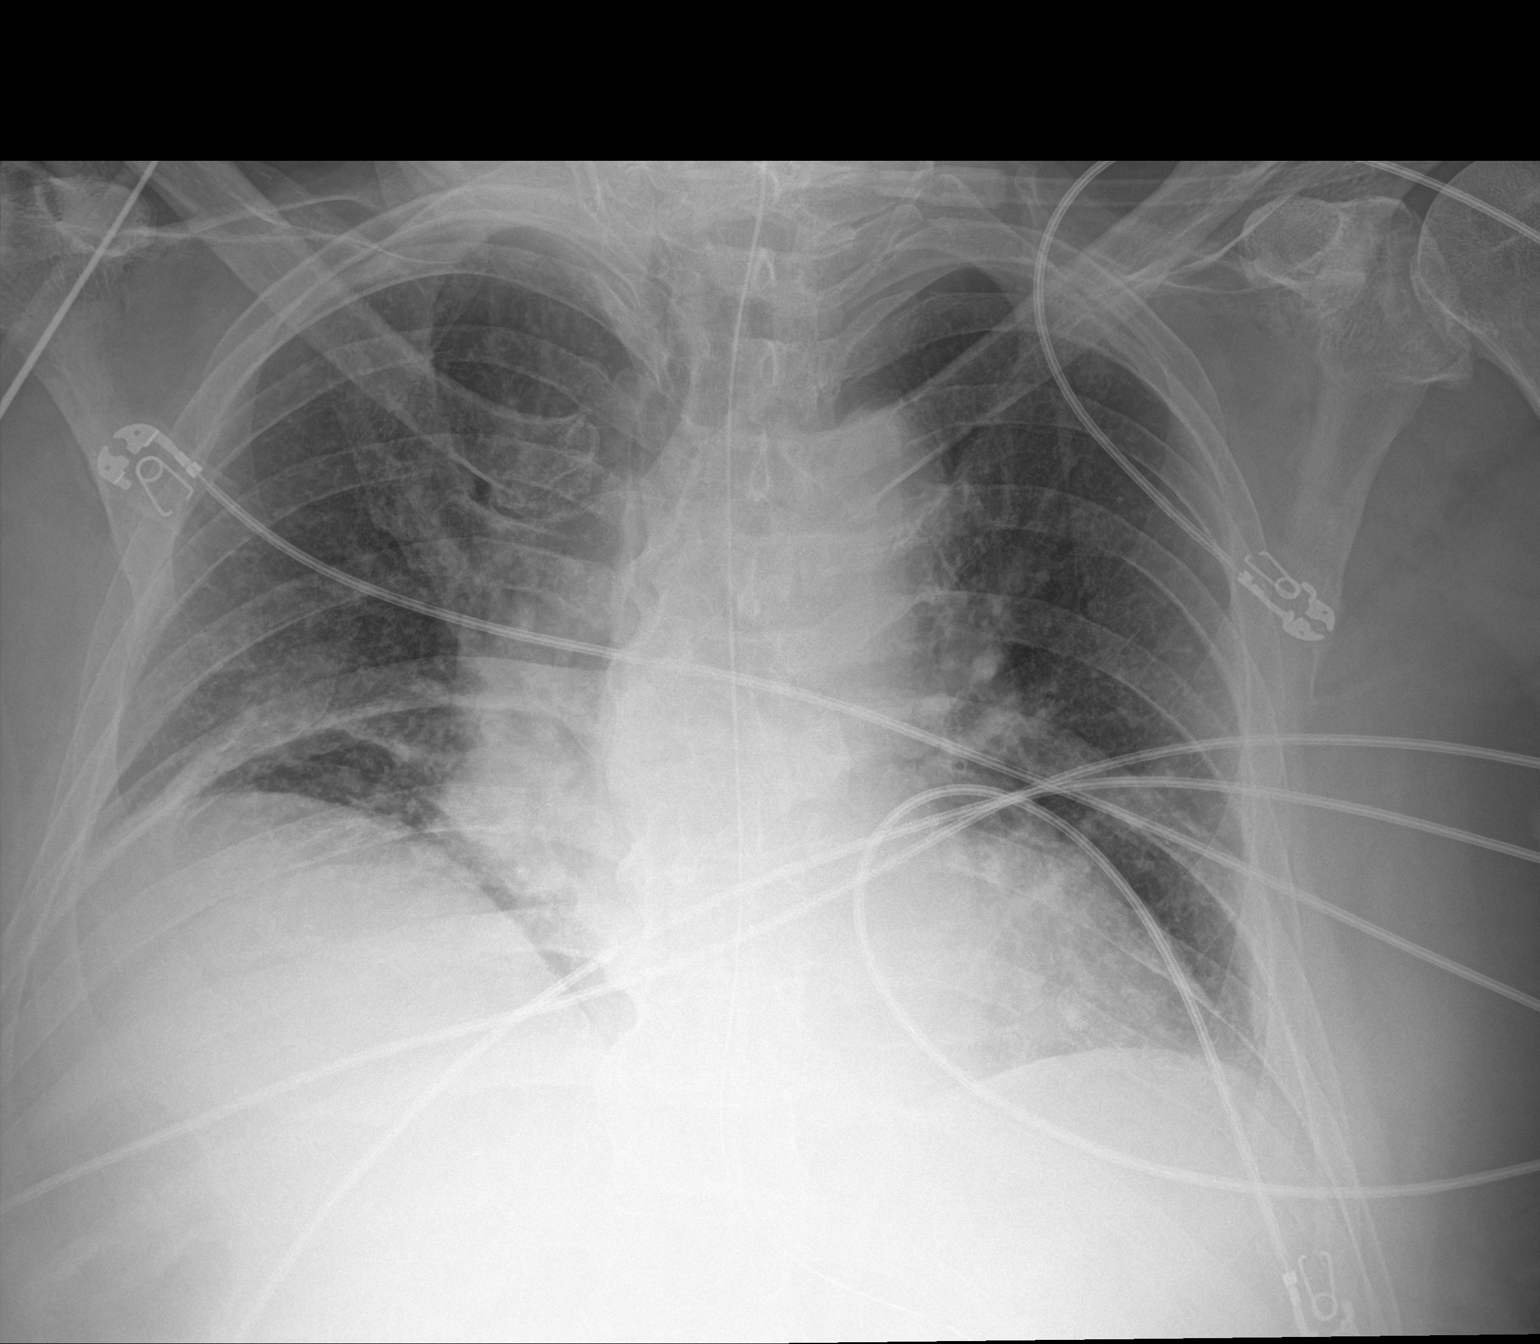

[1 of 1 positions shown; findings below may reference images not displayed]

FINDINGS: Nasogastric tube extends into the upper abdomen. Mild elevation of
the right hemidiaphragm is unchanged with associated right basilar
atelectasis. Lungs are otherwise clear. No pneumothorax or pleural
effusion. Cardiac size is within normal limits. Apparent mediastinal
widening is likely artifactual and related to semi-erect
positioning. Pulmonary vascularity is normal.
IMPRESSION: Stable mild elevation of the right hemidiaphragm with associated
right basilar atelectasis.

## 2020-05-12 MED ORDER — CLOPIDOGREL BISULFATE 75 MG PO TABS
75.0000 mg | ORAL_TABLET | Freq: Every day | ORAL | Status: DC
  Administered 2020-05-12: 75 mg

## 2020-05-12 MED ORDER — METOPROLOL TARTRATE 50 MG PO TABS: 50.0000 mg | ORAL_TABLET | Freq: Two times a day (BID) | ORAL | Status: DC

## 2020-05-12 MED ORDER — CLOPIDOGREL BISULFATE 75 MG PO TABS
75.0000 mg | ORAL_TABLET | Freq: Every day | ORAL | Status: DC
  Filled 2020-05-12: qty 1

## 2020-05-12 MED ORDER — POTASSIUM CHLORIDE 20 MEQ PO PACK
40.0000 meq | PACK | Freq: Once | ORAL | Status: AC
  Administered 2020-05-12: 40 meq

## 2020-05-12 MED ORDER — ASPIRIN 81 MG PO CHEW
81.0000 mg | CHEWABLE_TABLET | Freq: Every day | ORAL | Status: DC
  Administered 2020-05-12 – 2020-05-13 (×2): 81 mg
  Filled 2020-05-12: qty 1

## 2020-05-12 MED ORDER — ASPIRIN 81 MG PO CHEW: 81.0000 mg | CHEWABLE_TABLET | Freq: Every day | ORAL | Status: DC

## 2020-05-12 MED ORDER — MAGNESIUM SULFATE 2 GM/50ML IV SOLN
2.0000 g | Freq: Once | INTRAVENOUS | Status: AC
  Administered 2020-05-12: 2 g via INTRAVENOUS
  Filled 2020-05-12: qty 50

## 2020-05-12 MED ORDER — CLOPIDOGREL BISULFATE 75 MG PO TABS
75.0000 mg | ORAL_TABLET | Freq: Every day | ORAL | Status: DC
  Administered 2020-05-12 – 2020-05-14 (×3): 75 mg
  Filled 2020-05-12 (×2): qty 1

## 2020-05-12 MED ORDER — INSULIN GLARGINE 100 UNIT/ML ~~LOC~~ SOLN
25.0000 [IU] | Freq: Every day | SUBCUTANEOUS | Status: DC
  Administered 2020-05-12: 25 [IU] via SUBCUTANEOUS
  Filled 2020-05-12 (×2): qty 0.25

## 2020-05-12 MED ORDER — POTASSIUM CHLORIDE 20 MEQ PO PACK
40.0000 meq | PACK | Freq: Once | ORAL | Status: DC
  Filled 2020-05-12: qty 2

## 2020-05-12 MED ORDER — INSULIN ASPART 100 UNIT/ML ~~LOC~~ SOLN
0.0000 [IU] | SUBCUTANEOUS | Status: DC
  Administered 2020-05-12 (×2): 7 [IU] via SUBCUTANEOUS
  Administered 2020-05-12: 3 [IU] via SUBCUTANEOUS
  Administered 2020-05-12 – 2020-05-13 (×3): 7 [IU] via SUBCUTANEOUS
  Administered 2020-05-13: 11 [IU] via SUBCUTANEOUS
  Administered 2020-05-13: 4 [IU] via SUBCUTANEOUS
  Administered 2020-05-13: 11 [IU] via SUBCUTANEOUS

## 2020-05-12 MED ORDER — INSULIN ASPART 100 UNIT/ML ~~LOC~~ SOLN
5.0000 [IU] | SUBCUTANEOUS | Status: DC
  Administered 2020-05-12 – 2020-05-13 (×6): 5 [IU] via SUBCUTANEOUS

## 2020-05-12 MED ORDER — ASPIRIN 81 MG PO CHEW
81.0000 mg | CHEWABLE_TABLET | Freq: Every day | ORAL | Status: DC
  Filled 2020-05-12: qty 1

## 2020-05-12 MED ORDER — RESOURCE THICKENUP CLEAR PO POWD
ORAL | Status: DC | PRN
  Filled 2020-05-12: qty 125

## 2020-05-12 MED ORDER — AMLODIPINE BESYLATE 2.5 MG PO TABS
2.5000 mg | ORAL_TABLET | Freq: Every day | ORAL | Status: DC
  Administered 2020-05-12 – 2020-05-14 (×3): 2.5 mg
  Filled 2020-05-12 (×3): qty 1

## 2020-05-12 NOTE — Progress Notes (Signed)
  Echocardiogram 2D Echocardiogram has been performed.  Thomas Sampson 05/12/2020, 11:44 AM

## 2020-05-12 NOTE — Progress Notes (Addendum)
Referring Physician(s): Code stoke- Leonie Man, Pramod S (neurology)  Supervising Physician: Luanne Bras  Patient Status:  [facility] - In-pt  Chief Complaint: Stroke F/U  Subjective:  History of acute CVA s/p cerebral arteriogram with emergent mechanical thrombectomy of right MCA superior division occlusion achieving a TICI 2c revascularization via right femoral approach 05/10/2020 by Dr. Estanislado Pandy. Patient awake and alert laying in bed undergoing breathing treatment. RN at bedside. Moving all extremities with weakness of left side. Right femoral puncture site c/d/i.  MR/MRA brain/head 05/11/2020: 1. Acute right MCA vascular territory infarct as described. This has significantly increased in extent as compared to the brain MRI of 05/10/2020, and may have subtly increased in extent as compared to the head CT performed earlier today. No significant mass effect at this time 2. SWI signal loss along the right sylvian fissure which likely reflects a combination of petechial hemorrhage and the small-volume acute subarachnoid hemorrhage previously demonstrated at this site. 3. Small acute cortically-based infarct within the right occipital lobe (PCA vascular territory). 4. Punctate acute cortical infarct within the left occipital lobe (PCA vascular territory). 5. Background chronic findings without interval change, as described. 6. High-grade focal stenosis with possible short-segment flow gap at site of previously demonstrated mid-to-distal right M2 MCA branch occlusion. 7. Elsewhere, no intracranial large vessel occlusion or proximal high-grade arterial stenosis is identified.   Allergies: Demerol [meperidine]  Medications: Prior to Admission medications   Medication Sig Start Date End Date Taking? Authorizing Provider  Alpha-Lipoic Acid 600 MG TABS Take by mouth.    [provider]  amLODipine (NORVASC) 2.5 MG tablet Take 2.5 mg by mouth daily.    [provider]   aspirin EC 81 MG tablet Take by mouth.    [provider]  calcitRIOL (ROCALTROL) 0.25 MCG capsule Take 0.25 mcg by mouth daily. 04/26/20   [provider]  Cholecalciferol 2000 units CAPS Take 2,000 Units by mouth daily.    [provider]  cholestyramine Lucrezia Starch) 4 GM/DOSE powder Take by mouth 2 (two) times daily with a meal. Patient not taking: No sig reported    [provider]  colestipol (COLESTID) 1 g tablet Take 1 g by mouth 2 (two) times daily. 04/08/20   [provider]  cyanocobalamin (,VITAMIN B-12,) 1000 MCG/ML injection Inject 67m IM once very 4 weeks then once a month for 4 months. Patient not taking: No sig reported 09/27/18   [provider]  cyanocobalamin 1000 MCG tablet Take 1,000 mcg by mouth daily.    [provider]  gabapentin (NEURONTIN) 300 MG capsule Take by mouth 3 (three) times daily.  08/03/17 10/15/18  [provider]  glipiZIDE (GLUCOTROL XL) 10 MG 24 hr tablet Take 10 mg by mouth at bedtime.    [provider]  glucose blood (ONETOUCH ULTRA) test strip Use 2 (two) times daily Dx E11.9 07/26/18   [provider]  hydrochlorothiazide (HYDRODIURIL) 12.5 MG tablet Take 12.5 mg by mouth daily.    [provider]  insulin aspart (NOVOLOG) 100 UNIT/ML injection Inject 26 Units into the skin AC breakfast. take 26 units before breakfast and 22 units before supper Patient not taking: No sig reported    [provider]  insulin aspart protamine - aspart (NOVOLOG 70/30 MIX) (70-30) 100 UNIT/ML FlexPen 42-52 Units. 42 units qam and 52 qhs 05/01/17   [provider]  Insulin Pen Needle (BD PEN NEEDLE NANO U/F) 32G X 4 MM MISC USE WITH INJECTIONS 2  TIMES DAILY 07/26/18   [provider]  lisinopril (PRINIVIL,ZESTRIL) 40 MG tablet Take 40 mg by mouth daily. Take 20 mg twice a day    [provider]  metFORMIN (GLUCOPHAGE-XR) 500 MG 24 hr tablet Take 1,000  mg by mouth 2 (two) times daily. Patient not taking: No sig reported    [provider]  metoprolol succinate (TOPROL-XL) 100 MG 24 hr tablet Take 100 mg by mouth daily. Take with or immediately following a meal.    [provider]  pantoprazole (PROTONIX) 40 MG tablet Take 40 mg by mouth daily.    [provider]  pregabalin (LYRICA) 75 MG capsule Take 75 mg by mouth 2 (two) times daily. 05/05/20   [provider]     Vital Signs: BP (!) 106/56 (BP Location: Left Arm)   Pulse 74   Temp 99.2 F (37.3 C) (Axillary)   Resp 20   Ht '5\' 9"'  (1.753 m)   SpO2 98%   BMI 32.52 kg/m   Physical Exam Vitals and nursing note reviewed.  Constitutional:      General: He is not in acute distress. Pulmonary:     Effort: Pulmonary effort is normal. No respiratory distress.     Comments: Undergoing breathing treatment. Skin:    General: Skin is warm and dry.     Comments: Right femoral puncture site soft without active bleeding or hematoma.  Neurological:     Mental Status: He is alert.     Comments: Awake and follows simple commands. Speech and comprehension intact. PERRL bilaterally. Can spontaneously move all extremities with weakness of left side (can lift LLE but cannot hold, minimal flicker/shoulder movements of LUE). Distal pulses (DPs) palpable bilaterally.     Imaging: CT Head Wo Contrast  Result Date: 05/11/2020 CLINICAL DATA:  75 year old male status post code stroke presentation yesterday with right M3 occlusion status post endovascular revascularization. Contrast staining, subarachnoid contrast/blood intraoperatively. Subsequent encounter. EXAM: CT HEAD WITHOUT CONTRAST TECHNIQUE: Contiguous axial images were obtained from the base of the skull through the vertex without intravenous contrast. COMPARISON:  NIR flat panel head CT 1758 hours on 05/10/2020. Presentation plain head CT 0942 hours 05/10/2020. FINDINGS: Brain: Small volume subarachnoid  hemorrhage along the right sylvian fissure (series 3, image 18), new from the presentation CT yesterday but regressed compared to 1758 hours yesterday. No associated intraventricular hemorrhage or ventriculomegaly. Normal basilar cisterns. No midline shift. Roughly 4 cm area of cytotoxic edema is evident now at the right frontal operculum on series 3, image 21. No intra-axial hemorrhage identified. Stable gray-white matter differentiation elsewhere. Vascular: Some residual intravascular contrast is suspected. Skull: No acute osseous abnormality identified. Sinuses/Orbits: Visualized paranasal sinuses and mastoids are stable and well aerated. Other: Endotracheal tube partially visible in the oral cavity. No acute orbit or scalp soft tissue finding. IMPRESSION: 1. Small volume subarachnoid hemorrhage along the right Sylvian fissure is regressed compared to 1758 hours yesterday. 2. Cytotoxic edema now visible at the right frontal operculum. But no malignant hemorrhagic transformation. No significant intracranial mass effect. 3. Preliminary report of the above discussed by telephone with Dr. Quinn Axe on 05/11/2020 at 0552 hours. Electronically Signed   By: Genevie Ann M.D.   On: 05/11/2020 06:05   CT HEAD WO CONTRAST  Result Date: 05/10/2020 CLINICAL DATA:  Discoordination and slurred speech with fall EXAM: CT HEAD WITHOUT CONTRAST TECHNIQUE: Contiguous axial images were obtained from the base of the skull through the vertex without intravenous contrast. COMPARISON:  None. FINDINGS: Brain: There is mild diffuse atrophy. There is no appreciable intracranial mass, hemorrhage, extra-axial fluid collection, or midline shift. The brain parenchyma appears unremarkable. No acute infarct is demonstrable on this study. Vascular: There is no hyperdense vessel. There is slight calcification in the carotid siphon regions bilaterally. Skull: Bony calvarium appears intact. Sinuses/Orbits: There is mucosal thickening in the medial  superior left maxillary antrum. There is slight mucosal thickening in several ethmoid air cells. Orbits appear symmetric bilaterally. Other: Visualized mastoid air cells clear. IMPRESSION: Mild atrophy. Normal appearing brain parenchyma. No acute infarct demonstrated by CT. No mass or hemorrhage. Slight arterial vascular calcification noted. Areas of paranasal sinus mucosal thickening noted. Electronically Signed   By: Lowella Grip III M.D.   On: 05/10/2020 10:07   MR ANGIO HEAD WO CONTRAST  Result Date: 05/11/2020 CLINICAL DATA:  Stroke, follow-up. EXAM: MRI HEAD WITHOUT CONTRAST MRA HEAD WITHOUT CONTRAST TECHNIQUE: Multiplanar, multiecho pulse sequences of the brain and surrounding structures were obtained without intravenous contrast. Angiographic images of the head were obtained using MRA technique without contrast. COMPARISON:  Head CT 05/11/2020. Images from thrombectomy 05/10/2020. Noncontrast head CT, CT angiogram head/neck and CT perfusion 05/10/2020. MRI/MRA head 05/10/2020. Small chronic infarct within the right cerebellar hemisphere. FINDINGS: MRI HEAD FINDINGS Brain: Mild-to-moderate cerebral atrophy. Comparatively mild cerebellar atrophy. Acute cortical/subcortical infarct within the right MCA vascular territory. The dominant component of the infarct is present within the right frontal operculum and right insula and now measures 4.7 x 3.9 x 4.2 cm. However, there are additional cortical/subcortical acute infarcts more superiorly and posteriorly within the right frontal lobe and right parietal lobe (with involvement of the pre and postcentral gyri). Additional small acute infarcts are also present within the right caudate nucleus and right frontal lobe more anteriorly. SWI signal loss along the right sylvian fissure, likely reflecting a combination of petechial hemorrhage and the previously demonstrated small-volume acute subarachnoid hemorrhage at this site. Additional petechial hemorrhage  within the right caudate nucleus infarct. Additional petechial hemorrhage versus chronic microhemorrhages within the right frontal operculum and right frontal lobe white matter. There is no significant mass effect at this time. No midline shift. Small acute cortically based infarct within the right occipital lobe (PCA vascular territory). Punctate acute infarct within the left occipital lobe (PCA vascular territory) (series 5, image 70). Background mild multifocal T2/FLAIR hyperintensity within the cerebral white matter is nonspecific, but compatible with chronic small vessel ischemic disease. Redemonstrated small chronic infarct within the right cerebellar hemisphere. No evidence of intracranial mass. No extra-axial fluid collection. Vascular: Expected proximal arterial flow voids. Skull and upper cervical spine: No focal marrow lesion. Sinuses/Orbits: Visualized orbits show no acute finding. Trace scattered paranasal sinus mucosal thickening at the imaged levels. MRA HEAD FINDINGS: The intracranial internal carotid arteries are patent. The M1 middle cerebral arteries are patent. High-grade focal stenosis with possible short-segment flow gap at site of previously demonstrated mid to distal right M2 MCA branch occlusion (series 10, images 125-134) (series 1081, image 3). The anterior cerebral arteries are patent. The intracranial vertebral arteries are patent. The basilar artery is patent. The posterior cerebral arteries are patent. A right posterior communicating artery is present. The left posterior communicating artery is hypoplastic or absent. No intracranial aneurysm is identified. IMPRESSION: MRI brain: 1. Acute right MCA vascular territory infarct as described. This has significantly increased in extent as compared to the brain MRI of 05/10/2020, and may have subtly increased in extent as compared to the head CT performed  earlier today. No significant mass effect at this time 2. SWI signal loss along the  right sylvian fissure which likely reflects a combination of petechial hemorrhage and the small-volume acute subarachnoid hemorrhage previously demonstrated at this site. 3. Small acute cortically-based infarct within the right occipital lobe (PCA vascular territory). 4. Punctate acute cortical infarct within the left occipital lobe (PCA vascular territory). 5. Background chronic findings without interval change, as described. MRA head: 1. High-grade focal stenosis with possible short-segment flow gap at site of previously demonstrated mid-to-distal right M2 MCA branch occlusion. 2. Elsewhere, no intracranial large vessel occlusion or proximal high-grade arterial stenosis is identified. Electronically Signed   By: Kellie Simmering DO   On: 05/11/2020 18:10   MR ANGIO HEAD WO CONTRAST  Result Date: 05/10/2020 CLINICAL DATA:  TIA. Dysarthria and slurred speech. Mildly weak on the left. EXAM: MRI HEAD WITHOUT CONTRAST MRA HEAD WITHOUT CONTRAST TECHNIQUE: Multiplanar, multiecho pulse sequences of the brain and surrounding structures were obtained without intravenous contrast. Angiographic images of the head were obtained using MRA technique without contrast. COMPARISON:  CT head 05/10/2020 FINDINGS: MRI HEAD FINDINGS Brain: Acute infarct in the right insular cortex and right parietal operculum. There is FLAIR hyperintensity in the right MCA branches in this area due to slow flow or thrombosis. There is susceptibility in the right sylvian fissure compatible with thrombus in the right MCA branch. Mild atrophy. Mild chronic microvascular ischemic change in the white matter. 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 Negative for mass lesion. Vascular: Normal arterial flow voids at the base of brain. Susceptibility in the right sylvian fissure with FLAIR hyperintensity in the right MCA vessels compatible with slow flow or occlusion Skull and upper cervical spine: Negative Sinuses/Orbits: Mild mucosal edema paranasal  sinuses. Bilateral cataract extraction Other: None MRA HEAD FINDINGS Internal carotid artery widely patent bilaterally without stenosis. Anterior cerebral arteries widely patent with mild irregularity. Left middle cerebral artery widely patent with mild irregularity. Right M1 segment patent. Occlusion of the superior division of the right M3 vessel. Inferior division right M2 widely patent. Posterior circulation normal without significant stenosis. Fetal origin right posterior cerebral artery. IMPRESSION: 1. 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 2. Mild chronic microvascular ischemic change in the white matter 3. 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   On: 05/10/2020 13:52   MR BRAIN WO CONTRAST  Result Date: 05/11/2020 CLINICAL DATA:  Stroke, follow-up. EXAM: MRI HEAD WITHOUT CONTRAST MRA HEAD WITHOUT CONTRAST TECHNIQUE: Multiplanar, multiecho pulse sequences of the brain and surrounding structures were obtained without intravenous contrast. Angiographic images of the head were obtained using MRA technique without contrast. COMPARISON:  Head CT 05/11/2020. Images from thrombectomy 05/10/2020. Noncontrast head CT, CT angiogram head/neck and CT perfusion 05/10/2020. MRI/MRA head 05/10/2020. Small chronic infarct within the right cerebellar hemisphere. FINDINGS: MRI HEAD FINDINGS Brain: Mild-to-moderate cerebral atrophy. Comparatively mild cerebellar atrophy. Acute cortical/subcortical infarct within the right MCA vascular territory. The dominant component of the infarct is present within the right frontal operculum and right insula and now measures 4.7 x 3.9 x 4.2 cm. However, there are additional cortical/subcortical acute infarcts more superiorly and posteriorly within the right frontal lobe and right parietal lobe (with involvement of the pre and postcentral gyri). Additional small acute infarcts are also present within the right caudate  nucleus and right frontal lobe more anteriorly. SWI signal loss along the right sylvian fissure, likely reflecting a combination of petechial  hemorrhage and the previously demonstrated small-volume acute subarachnoid hemorrhage at this site. Additional petechial hemorrhage within the right caudate nucleus infarct. Additional petechial hemorrhage versus chronic microhemorrhages within the right frontal operculum and right frontal lobe white matter. There is no significant mass effect at this time. No midline shift. Small acute cortically based infarct within the right occipital lobe (PCA vascular territory). Punctate acute infarct within the left occipital lobe (PCA vascular territory) (series 5, image 70). Background mild multifocal T2/FLAIR hyperintensity within the cerebral white matter is nonspecific, but compatible with chronic small vessel ischemic disease. Redemonstrated small chronic infarct within the right cerebellar hemisphere. No evidence of intracranial mass. No extra-axial fluid collection. Vascular: Expected proximal arterial flow voids. Skull and upper cervical spine: No focal marrow lesion. Sinuses/Orbits: Visualized orbits show no acute finding. Trace scattered paranasal sinus mucosal thickening at the imaged levels. MRA HEAD FINDINGS: The intracranial internal carotid arteries are patent. The M1 middle cerebral arteries are patent. High-grade focal stenosis with possible short-segment flow gap at site of previously demonstrated mid to distal right M2 MCA branch occlusion (series 10, images 125-134) (series 1081, image 3). The anterior cerebral arteries are patent. The intracranial vertebral arteries are patent. The basilar artery is patent. The posterior cerebral arteries are patent. A right posterior communicating artery is present. The left posterior communicating artery is hypoplastic or absent. No intracranial aneurysm is identified. IMPRESSION: MRI brain: 1. Acute right MCA vascular territory  infarct as described. This has significantly increased in extent as compared to the brain MRI of 05/10/2020, and may have subtly increased in extent as compared to the head CT performed earlier today. No significant mass effect at this time 2. SWI signal loss along the right sylvian fissure which likely reflects a combination of petechial hemorrhage and the small-volume acute subarachnoid hemorrhage previously demonstrated at this site. 3. Small acute cortically-based infarct within the right occipital lobe (PCA vascular territory). 4. Punctate acute cortical infarct within the left occipital lobe (PCA vascular territory). 5. Background chronic findings without interval change, as described. MRA head: 1. High-grade focal stenosis with possible short-segment flow gap at site of previously demonstrated mid-to-distal right M2 MCA branch occlusion. 2. Elsewhere, no intracranial large vessel occlusion or proximal high-grade arterial stenosis is identified. Electronically Signed   By: Kellie Simmering DO   On: 05/11/2020 18:10   MR BRAIN WO CONTRAST  Result Date: 05/10/2020 CLINICAL DATA:  TIA. Dysarthria and slurred speech. Mildly weak on the left. EXAM: MRI HEAD WITHOUT CONTRAST MRA HEAD WITHOUT CONTRAST TECHNIQUE: Multiplanar, multiecho pulse sequences of the brain and surrounding structures were obtained without intravenous contrast. Angiographic images of the head were obtained using MRA technique without contrast. COMPARISON:  CT head 05/10/2020 FINDINGS: MRI HEAD FINDINGS Brain: Acute infarct in the right insular cortex and right parietal operculum. There is FLAIR hyperintensity in the right MCA branches in this area due to slow flow or thrombosis. There is susceptibility in the right sylvian fissure compatible with thrombus in the right MCA branch. Mild atrophy. Mild chronic microvascular ischemic change in the white matter. 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 Negative for mass lesion. Vascular: Normal  arterial flow voids at the base of brain. Susceptibility in the right sylvian fissure with FLAIR hyperintensity in the right MCA vessels compatible with slow flow or occlusion Skull and upper cervical spine: Negative Sinuses/Orbits: Mild mucosal edema paranasal sinuses. Bilateral cataract extraction Other: None MRA HEAD FINDINGS Internal carotid artery widely patent bilaterally without stenosis. Anterior cerebral arteries  widely patent with mild irregularity. Left middle cerebral artery widely patent with mild irregularity. Right M1 segment patent. Occlusion of the superior division of the right M3 vessel. Inferior division right M2 widely patent. Posterior circulation normal without significant stenosis. Fetal origin right posterior cerebral artery. IMPRESSION: 1. 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 2. Mild chronic microvascular ischemic change in the white matter 3. 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   On: 05/10/2020 13:52   IR CT Head Ltd  Result Date: 05/12/2020 INDICATION: New onset of right gaze deviation and left sided weakness arm greater than leg. 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 EXAM: 1. EMERGENT LARGE VESSEL OCCLUSION THROMBOLYSIS (anterior CIRCULATION) COMPARISON:  CT angiogram of head and neck of May 10, 2020. MEDICATIONS: Ancef 2 g IV antibiotic was administered within 1 hour of the procedure. ANESTHESIA/SEDATION: General anesthesia CONTRAST:  Isovue 300 approximately 100 mL. FLUOROSCOPY TIME:  Fluoroscopy Time: 79 minutes 24 seconds (2346 mGy). COMPLICATIONS: None immediate. TECHNIQUE: Following a full explanation of the procedure along with the potential associated complications, an informed witnessed consent was obtained. The risks of intracranial hemorrhage of 10%, worsening neurological deficit, ventilator dependency, death and inability to revascularize were  all reviewed in detail with the patient's spouse. The patient was then put under general anesthesia by the Department of Anesthesiology at [facility]. The right groin was prepped and draped in the usual sterile fashion. Thereafter using modified Seldinger technique, transfemoral access into the right common femoral artery was obtained without difficulty. Over a 0.035 inch guidewire an 8 [place] Pinnacle 25 cm sheath was inserted. Through this, and also over a 0.035 inch guidewire a combination of a 5.5 French Simmons 2 catheter inside of the 087 balloon guide catheter was advanced over a 0.035 inch Roadrunner guidewire and positioned without difficulty into the right common carotid artery. The guidewire was removed. Good aspiration was obtained from the hub of the balloon guide catheter. FINDINGS: A gentle control arteriogram performed through the balloon guide catheter demonstrated no evidence of spasms, dissections or of intraluminal filling defects. The right common carotid arteriogram demonstrates wide patency of the right external carotid artery and its major branches. The right internal carotid artery at the bulb to the cranial skull base demonstrates wide patency. The petrous, the cavernous and supraclinoid segments are widely patent. The right middle cerebral artery and the right anterior cerebral artery are seen to opacify into the capillary and venous phases. 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 Right posterior communicating artery is seen opacifying the right posterior cerebral artery distribution. PROCEDURE: Through the 087 balloon guide catheter in the right common carotid artery, a combination of an 055 136 Zoom aspiration catheter with an 035 Zoom aspiration catheter was advanced over a 0.014 inch standard Synchro micro guidewire with a J configuration to the right M1 segment. Using a torque device,  access was obtained to just proximal to the occluded superior division M2 segment. Access to the distal M3 M4 inferior division branch was obtained just distal to the occluded site. However, advancement of the 035 Zoom aspiration catheter was met with significant resistance with change in configuration distally. The micro guidewire, and the 035 Zoom aspiration catheter were removed. The 055 Zoom aspiration was advanced to be just distal to the occluded site of the superior division.  Constant aspiration was then performed via a Penumbra aspiration device for approximately 2 minutes. Thereafter, the 055 Zoom aspiration was retrieved and removed. A control arteriogram performed through the balloon guide in the distal right ICA continued to demonstrate no change in the occluded superior division prominent branch. No clot was noted in the aspirate or within the 055 aspiration catheter. Another pass was then made again using the 055 aspiration catheter with a 162 021 microcatheter over a 0.014 inch standard Synchro micro guidewire. This combination was again advanced as described earlier and positioned at the site of the occlusion. The micro guidewire was replaced with an 016 Fathom micro guidewire. This advanced easily through the occluded superior division occluded branch into the M4 region. This was then followed by the microcatheter. The guidewire was removed. Good aspiration obtained from the hub of the microcatheter. A gentle control arteriogram performed through the microcatheter demonstrated safe position of the tip of the microcatheter which was then connected to continuous heparinized saline infusion. A 3 mm x 40 mm Solitaire X retrieval device was then advanced to the distal end of the microcatheter and deployed in the usual manner without difficulty. The microcatheter was retrieved more proximally into the right internal carotid artery. The 055 Zoom aspiration was then advanced abutting against the occluded  superior division M2 segment. With proximal flow arrest in the right internal carotid artery, and continuous aspiration with a 20 mL syringe at the hub of the balloon guide catheter, and constant aspiration applied at the hub of the 055 Zoom aspiration catheter for approximately 2-1/2 minutes, the combination of the retrieval device, the microcatheter and the Zoom 055 aspiration catheter were retrieved and removed. Thick sticky clot was seen entangled within the retrieval device, and also in the hub of the Tuohy [place]. A control arteriogram performed following reversal of flow arrest in the right internal carotid artery demonstrated now complete revascularization of 2 of the 3 main branches emanating from the superior division. However, there continued be a distal M3 occlusion of an anterior perisylvian branch leading on up to the hyper perfused subcortical frontal perisylvian region seen on the CT perfusion mapping studies. A TICI 2C revascularization was achieved. 200 mcg of nitroglycerin was then infused through the balloon guide catheter in the right internal carotid artery to obviate mild to moderate spasm at the proximal superior division of the right MCA. A final control arteriogram performed through the balloon guide in the right common carotid artery continued to demonstrate patency of the extracranial, and intracranial right ICA, a TICI 2C revascularization of the right middle cerebral artery distribution. The right anterior cerebral artery and right posterior communicating artery demonstrate wide patency unchanged. The balloon guide was removed. An 8 French Angio-Seal closure device was applied for hemostasis at the right groin puncture site. Distal pulses were Dopplerable in both feet unchanged. A CT of the brain demonstrated focal area of hyperattenuation in the putamen on the right side associated with moderate hyperattenuation in the posterior right perisylvian subarachnoid space. No evidence of mass  or midline shift was noted. The patient was left intubated for airway protection. He was then transferred to the neuro ICU for post embolization management. IMPRESSION: Status post endovascular near complete revascularization of occluded superior division of the right middle cerebral artery with 1 pass with contact aspiration, and 1 pass with contact aspiration with a 3 mm x 40 mm Solitaire retrieval device achieving a TICI 2C revascularization. PLAN: Follow-up with the referring MD. Electronically Signed  By: Luanne Bras M.D.   On: 05/11/2020 10:58   CT CEREBRAL PERFUSION W CONTRAST  Addendum Date: 05/10/2020   ADDENDUM REPORT: 05/10/2020 15:20 ADDENDUM: These results were called by telephone at the time of interpretation on 05/10/2020 at 3:20 pm to provider [facility] , who verbally acknowledged these results. Electronically Signed   By: Franchot Gallo M.D.   On: 05/10/2020 15:20   Result Date: 05/10/2020 CLINICAL DATA:  Stroke. EXAM: CT ANGIOGRAPHY HEAD AND NECK CT PERFUSION BRAIN TECHNIQUE: Multidetector CT imaging of the head and neck was performed using the standard protocol during bolus administration of intravenous contrast. Multiplanar CT image reconstructions and MIPs were obtained to evaluate the vascular anatomy. Carotid stenosis measurements (when applicable) are obtained utilizing NASCET criteria, using the distal internal carotid diameter as the denominator. Multiphase CT imaging of the brain was performed following IV bolus contrast injection. Subsequent parametric perfusion maps were calculated using RAPID software. CONTRAST:  180m OMNIPAQUE IOHEXOL 350 MG/ML SOLN COMPARISON:  CT head 05/10/2020.  MRI and MRA head 05/10/2020 FINDINGS: CTA NECK FINDINGS Aortic arch: Standard branching. Imaged portion shows no evidence of aneurysm or dissection. No significant stenosis of the major arch vessel origins. Right carotid system: Mild atherosclerotic calcification right carotid  bifurcation without significant stenosis. No irregularity or thrombus in the right carotid. Left carotid system: Mild atherosclerotic disease left carotid without stenosis or irregularity. Vertebral arteries: Both vertebral arteries widely patent. Skeleton: Disc degeneration and spurring. Foraminal narrowing due to spurring bilaterally at C5-6 and C6-7. No acute skeletal abnormality. Other neck: No mass or edema identified. Upper chest: Lung apices clear bilaterally. Review of the MIP images confirms the above findings CTA HEAD FINDINGS Anterior circulation: Cavernous carotid widely patent bilaterally. Anterior cerebral arteries widely patent. Left middle cerebral artery widely patent. Right M1 segment widely patent. There is occlusion of a large right M3 branch superior division right MCA as noted on prior MRA. This appears acute. Posterior circulation: Normal posterior circulation. No stenosis or large vessel occlusion. Venous sinuses: Normal venous enhancement. Anatomic variants: None Review of the MIP images confirms the above findings CT Brain Perfusion Findings: ASPECTS: 10 CBF (<30%) Volume: 263mPerfusion (Tmax>6.0s) volume: 6240mismatch Volume: 59m54mfarction Location:Right insula and right operculum. IMPRESSION: 1. Abnormal CT perfusion. 29 mm core infarct with additional 33 mL of surrounding penumbra in the right insula and operculum corresponding to restricted diffusion on MRI. 2. Occluded right superior branch of M3 which is a relatively large vessel compatible with an embolus. 3. No significant carotid or vertebral artery stenosis in the neck. Electronically Signed: By: CharFranchot Gallo. On: 05/10/2020 15:15   DG CHEST PORT 1 VIEW  Result Date: 05/12/2020 CLINICAL DATA:  Aspiration EXAM: PORTABLE CHEST 1 VIEW COMPARISON:  05/11/2020 FINDINGS: Nasogastric tube extends into the upper abdomen. Mild elevation of the right hemidiaphragm is unchanged with associated right basilar atelectasis. Lungs  are otherwise clear. No pneumothorax or pleural effusion. Cardiac size is within normal limits. Apparent mediastinal widening is likely artifactual and related to semi-erect positioning. Pulmonary vascularity is normal. IMPRESSION: Stable mild elevation of the right hemidiaphragm with associated right basilar atelectasis. Electronically Signed   By: AsheFidela Salisbury  On: 05/12/2020 06:11   DG CHEST PORT 1 VIEW  Result Date: 05/11/2020 CLINICAL DATA:  Hypoxia EXAM: PORTABLE CHEST 1 VIEW COMPARISON:  05/10/2020 FINDINGS: Cardiac shadow is stable. Gastric catheter is noted in satisfactory position. Previously seen endotracheal tube is no longer identified. Bibasilar atelectatic changes are  noted slightly increased when compared with the prior exam likely accentuated by poor inspiratory effort. No acute bony abnormality is seen. IMPRESSION: Increasing bibasilar atelectasis. Electronically Signed   By: Inez Catalina M.D.   On: 05/11/2020 12:48   DG CHEST PORT 1 VIEW  Result Date: 05/10/2020 CLINICAL DATA:  Endotracheal tube adjustment EXAM: PORTABLE CHEST 1 VIEW COMPARISON:  05/10/2020 FINDINGS: Single frontal view of the chest demonstrates endotracheal tube overlying tracheal air column, tip remains approximately 8.7 cm above carina. Recommend advancing 5-6 cm. Cardiac silhouette is stable. Hypoventilatory changes at the lung bases. No effusion or pneumothorax. IMPRESSION: 1. Endotracheal tube remains well above carina, recommend advancing 5-6 cm. 2. Bilateral lower lobe hypoventilatory changes. Electronically Signed   By: Randa Ngo M.D.   On: 05/10/2020 20:18   DG CHEST PORT 1 VIEW  Result Date: 05/10/2020 CLINICAL DATA:  Endotracheal tube present. EXAM: PORTABLE CHEST 1 VIEW COMPARISON:  Earlier this day. FINDINGS: The endotracheal tube is been retracted, the tip is now 9.7 cm above the carina. Re-expansion of the right upper lobe in the interim. Low lung volumes with patchy bibasilar opacities, left  greater than right. No visualized pneumothorax. Stable heart size and mediastinal contours. IMPRESSION: 1. Endotracheal tube retracted, tip now 9.7 cm above the carina. Advancement of 5-6 cm would place the endotracheal tube tip at the level of the clavicular heads. 2. Re-expansion of the right upper lobe. 3. Low lung volumes with patchy bibasilar opacities, left greater than right, likely atelectasis. Electronically Signed   By: Keith Rake M.D.   On: 05/10/2020 20:06   Portable Chest x-ray  Result Date: 05/10/2020 CLINICAL DATA:  Intubated, stroke EXAM: PORTABLE CHEST 1 VIEW COMPARISON:  None. FINDINGS: Single frontal view of the chest demonstrates endotracheal tube overlying tracheal air column, tip approximately 1.5 cm above carina. There is dense right upper lobe consolidation with volume loss consistent with atelectasis. This could be due to mucous plugging. Hypoventilatory changes are seen at the left lung base. No effusion or pneumothorax. No acute bony abnormalities. IMPRESSION: 1. Endotracheal tube approximately 1.5 cm above carina. 2. Dense right upper lobe consolidation and volume loss consistent with atelectasis. This could be due to mucous plugging. 3. Hypoventilatory changes at the left lung base. These results were called by telephone at the time of interpretation on 05/10/2020 at 7:18 pm to provider DR Lucile Shutters , who verbally acknowledged these results. Electronically Signed   By: Randa Ngo M.D.   On: 05/10/2020 19:31   DG Abd Portable 1V  Result Date: 05/11/2020 CLINICAL DATA:  Check gastric catheter placement EXAM: PORTABLE ABDOMEN - 1 VIEW COMPARISON:  None. FINDINGS: Gastric catheter is noted within the stomach. No obstructive changes are seen. No free air is noted. IMPRESSION: Gastric catheter within the stomach. Electronically Signed   By: Inez Catalina M.D.   On: 05/11/2020 12:58   IR PERCUTANEOUS ART THROMBECTOMY/INFUSION INTRACRANIAL INC DIAG ANGIO  Result Date:  05/12/2020 INDICATION: New onset of right gaze deviation and left sided weakness arm greater than leg. 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 EXAM: 1. EMERGENT LARGE VESSEL OCCLUSION THROMBOLYSIS (anterior CIRCULATION) COMPARISON:  CT angiogram of head and neck of May 10, 2020. MEDICATIONS: Ancef 2 g IV antibiotic was administered within 1 hour of the procedure. ANESTHESIA/SEDATION: General anesthesia CONTRAST:  Isovue 300 approximately 100 mL. FLUOROSCOPY TIME:  Fluoroscopy Time: 79 minutes 24 seconds (2346 mGy). COMPLICATIONS: None immediate. TECHNIQUE: Following a full explanation of the procedure  along with the potential associated complications, an informed witnessed consent was obtained. The risks of intracranial hemorrhage of 10%, worsening neurological deficit, ventilator dependency, death and inability to revascularize were all reviewed in detail with the patient's spouse. The patient was then put under general anesthesia by the Department of Anesthesiology at [facility]. The right groin was prepped and draped in the usual sterile fashion. Thereafter using modified Seldinger technique, transfemoral access into the right common femoral artery was obtained without difficulty. Over a 0.035 inch guidewire an 8 [place] Pinnacle 25 cm sheath was inserted. Through this, and also over a 0.035 inch guidewire a combination of a 5.5 French Simmons 2 catheter inside of the 087 balloon guide catheter was advanced over a 0.035 inch [place] guidewire and positioned without difficulty into the right common carotid artery. The guidewire was removed. Good aspiration was obtained from the hub of the balloon guide catheter. FINDINGS: A gentle control arteriogram performed through the balloon guide catheter demonstrated no evidence of spasms, dissections or of intraluminal filling defects. The right common carotid arteriogram demonstrates wide patency of the right  external carotid artery and its major branches. The right internal carotid artery at the bulb to the cranial skull base demonstrates wide patency. The petrous, the cavernous and supraclinoid segments are widely patent. The right middle cerebral artery and the right anterior cerebral artery are seen to opacify into the capillary and venous phases. 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 Right posterior communicating artery is seen opacifying the right posterior cerebral artery distribution. PROCEDURE: Through the 087 balloon guide catheter in the right common carotid artery, a combination of an 055 136 Zoom aspiration catheter with an 035 Zoom aspiration catheter was advanced over a 0.014 inch standard Synchro micro guidewire with a J configuration to the right M1 segment. Using a torque device, access was obtained to just proximal to the occluded superior division M2 segment. Access to the distal M3 M4 inferior division branch was obtained just distal to the occluded site. However, advancement of the 035 Zoom aspiration catheter was met with significant resistance with change in configuration distally. The micro guidewire, and the 035 Zoom aspiration catheter were removed. The 055 Zoom aspiration was advanced to be just distal to the occluded site of the superior division. Constant aspiration was then performed via a Penumbra aspiration device for approximately 2 minutes. Thereafter, the 055 Zoom aspiration was retrieved and removed. A control arteriogram performed through the balloon guide in the distal right ICA continued to demonstrate no change in the occluded superior division prominent branch. No clot was noted in the aspirate or within the 055 aspiration catheter. Another pass was then made again using the 055 aspiration catheter with a 162 021 microcatheter over a 0.014 inch standard Synchro micro guidewire. This  combination was again advanced as described earlier and positioned at the site of the occlusion. The micro guidewire was replaced with an 016 Fathom micro guidewire. This advanced easily through the occluded superior division occluded branch into the M4 region. This was then followed by the microcatheter. The guidewire was removed. Good aspiration obtained from the hub of the microcatheter. A gentle control arteriogram performed through the microcatheter demonstrated safe position of the tip of the microcatheter which was then connected to continuous heparinized saline infusion. A 3 mm x 40 mm Solitaire X retrieval device was then advanced to the distal end of the microcatheter  and deployed in the usual manner without difficulty. The microcatheter was retrieved more proximally into the right internal carotid artery. The 055 Zoom aspiration was then advanced abutting against the occluded superior division M2 segment. With proximal flow arrest in the right internal carotid artery, and continuous aspiration with a 20 mL syringe at the hub of the balloon guide catheter, and constant aspiration applied at the hub of the 055 Zoom aspiration catheter for approximately 2-1/2 minutes, the combination of the retrieval device, the microcatheter and the Zoom 055 aspiration catheter were retrieved and removed. Thick sticky clot was seen entangled within the retrieval device, and also in the hub of the Tuohy [place]. A control arteriogram performed following reversal of flow arrest in the right internal carotid artery demonstrated now complete revascularization of 2 of the 3 main branches emanating from the superior division. However, there continued be a distal M3 occlusion of an anterior perisylvian branch leading on up to the hyper perfused subcortical frontal perisylvian region seen on the CT perfusion mapping studies. A TICI 2C revascularization was achieved. 200 mcg of nitroglycerin was then infused through the balloon guide  catheter in the right internal carotid artery to obviate mild to moderate spasm at the proximal superior division of the right MCA. A final control arteriogram performed through the balloon guide in the right common carotid artery continued to demonstrate patency of the extracranial, and intracranial right ICA, a TICI 2C revascularization of the right middle cerebral artery distribution. The right anterior cerebral artery and right posterior communicating artery demonstrate wide patency unchanged. The balloon guide was removed. An 8 French Angio-Seal closure device was applied for hemostasis at the right groin puncture site. Distal pulses were Dopplerable in both feet unchanged. A CT of the brain demonstrated focal area of hyperattenuation in the putamen on the right side associated with moderate hyperattenuation in the posterior right perisylvian subarachnoid space. No evidence of mass or midline shift was noted. The patient was left intubated for airway protection. He was then transferred to the neuro ICU for post embolization management. IMPRESSION: Status post endovascular near complete revascularization of occluded superior division of the right middle cerebral artery with 1 pass with contact aspiration, and 1 pass with contact aspiration with a 3 mm x 40 mm Solitaire retrieval device achieving a TICI 2C revascularization. PLAN: Follow-up with the referring MD. Electronically Signed   By: Luanne Bras M.D.   On: 05/11/2020 10:58   VAS [place] LOWER EXTREMITY VENOUS (DVT)  Result Date: 05/12/2020  Lower Venous DVT Study Indications: Swelling and erythema LT>RT, s/p stroke.  Limitations: S/P RT cath. Comparison Study: No prior studies. Performing Technologist: Darlin Coco RDMS,RVT  Examination Guidelines: A complete evaluation includes B-mode imaging, spectral Doppler, color Doppler, and power Doppler as needed of all accessible portions of each vessel. Bilateral testing is considered an integral part of a  complete examination. Limited examinations for reoccurring indications may be performed as noted. The reflux portion of the exam is performed with the patient in reverse Trendelenburg.  +---------+---------------+---------+-----------+----------+-------------------+ RIGHT    CompressibilityPhasicitySpontaneityPropertiesThrombus Aging      +---------+---------------+---------+-----------+----------+-------------------+ CFV      Full           Yes      Yes                                      +---------+---------------+---------+-----------+----------+-------------------+ SFJ  Limited visibility                                                        due to recent cath  +---------+---------------+---------+-----------+----------+-------------------+ FV Prox  Full                                                             +---------+---------------+---------+-----------+----------+-------------------+ FV Mid   Full                                                             +---------+---------------+---------+-----------+----------+-------------------+ FV DistalFull                                                             +---------+---------------+---------+-----------+----------+-------------------+ PFV      Full                                                             +---------+---------------+---------+-----------+----------+-------------------+ POP      Full           Yes      Yes                                      +---------+---------------+---------+-----------+----------+-------------------+ PTV      Full                                                             +---------+---------------+---------+-----------+----------+-------------------+ PERO     Full                                                              +---------+---------------+---------+-----------+----------+-------------------+   +---------+---------------+---------+-----------+----------+--------------+ LEFT     CompressibilityPhasicitySpontaneityPropertiesThrombus Aging +---------+---------------+---------+-----------+----------+--------------+ CFV      Full           Yes      Yes                                 +---------+---------------+---------+-----------+----------+--------------+ SFJ      Full                                                        +---------+---------------+---------+-----------+----------+--------------+  FV Prox  Full                                                        +---------+---------------+---------+-----------+----------+--------------+ FV Mid   Full                                                        +---------+---------------+---------+-----------+----------+--------------+ FV DistalFull                                                        +---------+---------------+---------+-----------+----------+--------------+ PFV      Full                                                        +---------+---------------+---------+-----------+----------+--------------+ POP      Full           Yes      Yes                                 +---------+---------------+---------+-----------+----------+--------------+ PTV      Full                                                        +---------+---------------+---------+-----------+----------+--------------+ PERO     Full                                                        +---------+---------------+---------+-----------+----------+--------------+     Summary: RIGHT: - There is no evidence of deep vein thrombosis in the lower extremity.  - No cystic structure found in the popliteal fossa.  LEFT: - There is no evidence of deep vein thrombosis in the lower extremity.  - No cystic structure found in the popliteal  fossa.  *See table(s) above for measurements and observations.    Preliminary    CT ANGIO HEAD CODE STROKE  Addendum Date: 05/10/2020   ADDENDUM REPORT: 05/10/2020 15:20 ADDENDUM: These results were called by telephone at the time of interpretation on 05/10/2020 at 3:20 pm to provider [facility] , who verbally acknowledged these results. Electronically Signed   By: Franchot Gallo M.D.   On: 05/10/2020 15:20   Result Date: 05/10/2020 CLINICAL DATA:  Stroke. EXAM: CT ANGIOGRAPHY HEAD AND NECK CT PERFUSION BRAIN TECHNIQUE: Multidetector CT imaging of the head and neck was performed using the standard protocol during bolus administration of intravenous contrast. Multiplanar CT image reconstructions and MIPs were obtained to evaluate the  vascular anatomy. Carotid stenosis measurements (when applicable) are obtained utilizing NASCET criteria, using the distal internal carotid diameter as the denominator. Multiphase CT imaging of the brain was performed following IV bolus contrast injection. Subsequent parametric perfusion maps were calculated using RAPID software. CONTRAST:  176m OMNIPAQUE IOHEXOL 350 MG/ML SOLN COMPARISON:  CT head 05/10/2020.  MRI and MRA head 05/10/2020 FINDINGS: CTA NECK FINDINGS Aortic arch: Standard branching. Imaged portion shows no evidence of aneurysm or dissection. No significant stenosis of the major arch vessel origins. Right carotid system: Mild atherosclerotic calcification right carotid bifurcation without significant stenosis. No irregularity or thrombus in the right carotid. Left carotid system: Mild atherosclerotic disease left carotid without stenosis or irregularity. Vertebral arteries: Both vertebral arteries widely patent. Skeleton: Disc degeneration and spurring. Foraminal narrowing due to spurring bilaterally at C5-6 and C6-7. No acute skeletal abnormality. Other neck: No mass or edema identified. Upper chest: Lung apices clear bilaterally. Review of the MIP images  confirms the above findings CTA HEAD FINDINGS Anterior circulation: Cavernous carotid widely patent bilaterally. Anterior cerebral arteries widely patent. Left middle cerebral artery widely patent. Right M1 segment widely patent. There is occlusion of a large right M3 branch superior division right MCA as noted on prior MRA. This appears acute. Posterior circulation: Normal posterior circulation. No stenosis or large vessel occlusion. Venous sinuses: Normal venous enhancement. Anatomic variants: None Review of the MIP images confirms the above findings CT Brain Perfusion Findings: ASPECTS: 10 CBF (<30%) Volume: 265mPerfusion (Tmax>6.0s) volume: 6241mismatch Volume: 13m34mfarction Location:Right insula and right operculum. IMPRESSION: 1. Abnormal CT perfusion. 29 mm core infarct with additional 33 mL of surrounding penumbra in the right insula and operculum corresponding to restricted diffusion on MRI. 2. Occluded right superior branch of M3 which is a relatively large vessel compatible with an embolus. 3. No significant carotid or vertebral artery stenosis in the neck. Electronically Signed: By: CharFranchot Gallo. On: 05/10/2020 15:15   CT ANGIO NECK CODE STROKE  Addendum Date: 05/10/2020   ADDENDUM REPORT: 05/10/2020 15:20 ADDENDUM: These results were called by telephone at the time of interpretation on 05/10/2020 at 3:20 pm to provider [facility] verbally acknowledged these results. Electronically Signed   By: CharFranchot Gallo.   On: 05/10/2020 15:20   Result Date: 05/10/2020 CLINICAL DATA:  Stroke. EXAM: CT ANGIOGRAPHY HEAD AND NECK CT PERFUSION BRAIN TECHNIQUE: Multidetector CT imaging of the head and neck was performed using the standard protocol during bolus administration of intravenous contrast. Multiplanar CT image reconstructions and MIPs were obtained to evaluate the vascular anatomy. Carotid stenosis measurements (when applicable) are obtained utilizing NASCET criteria, using the  distal internal carotid diameter as the denominator. Multiphase CT imaging of the brain was performed following IV bolus contrast injection. Subsequent parametric perfusion maps were calculated using RAPID software. CONTRAST:  100mL84mIPAQUE IOHEXOL 350 MG/ML SOLN COMPARISON:  CT head 05/10/2020.  MRI and MRA head 05/10/2020 FINDINGS: CTA NECK FINDINGS Aortic arch: Standard branching. Imaged portion shows no evidence of aneurysm or dissection. No significant stenosis of the major arch vessel origins. Right carotid system: Mild atherosclerotic calcification right carotid bifurcation without significant stenosis. No irregularity or thrombus in the right carotid. Left carotid system: Mild atherosclerotic disease left carotid without stenosis or irregularity. Vertebral arteries: Both vertebral arteries widely patent. Skeleton: Disc degeneration and spurring. Foraminal narrowing due to spurring bilaterally at C5-6 and C6-7. No acute skeletal abnormality. Other neck: No mass or edema identified. Upper chest: Lung apices clear  bilaterally. Review of the MIP images confirms the above findings CTA HEAD FINDINGS Anterior circulation: Cavernous carotid widely patent bilaterally. Anterior cerebral arteries widely patent. Left middle cerebral artery widely patent. Right M1 segment widely patent. There is occlusion of a large right M3 branch superior division right MCA as noted on prior MRA. This appears acute. Posterior circulation: Normal posterior circulation. No stenosis or large vessel occlusion. Venous sinuses: Normal venous enhancement. Anatomic variants: None Review of the MIP images confirms the above findings CT Brain Perfusion Findings: ASPECTS: 10 CBF (<30%) Volume: 24m Perfusion (Tmax>6.0s) volume: 626mMismatch Volume: 3352mnfarction Location:Right insula and right operculum. IMPRESSION: 1. Abnormal CT perfusion. 29 mm core infarct with additional 33 mL of surrounding penumbra in the right insula and operculum  corresponding to restricted diffusion on MRI. 2. Occluded right superior branch of M3 which is a relatively large vessel compatible with an embolus. 3. No significant carotid or vertebral artery stenosis in the neck. Electronically Signed: By: ChaFranchot GalloD. On: 05/10/2020 15:15    Labs:  CBC: Recent Labs    05/10/20 0940 05/10/20 1946 05/11/20 0405 05/12/20 0444  WBC 9.0  --  17.2* 15.2*  HGB 14.6 13.6 12.7* 11.6*  HCT 42.6 40.0 38.8* 35.6*  PLT 270  --  269 241    COAGS: Recent Labs    05/10/20 0940  INR 1.0  APTT 24    BMP: Recent Labs    05/10/20 0940 05/10/20 1946 05/11/20 0405 05/12/20 0444  NA 139 140 138 145  K 3.7 4.0 4.2 3.5  CL 103  --  102 111  CO2 26  --  21* 26  GLUCOSE 207*  --  409* 213*  BUN 31*  --  28* 27*  CALCIUM 9.7  --  8.7* 8.3*  CREATININE 2.26*  --  2.26* 2.05*  GFRNONAA 30*  --  30* 33*    LIVER FUNCTION TESTS: Recent Labs    05/10/20 0940 05/11/20 0405 05/11/20 1147 05/12/20 0444  BILITOT 0.8 0.8 0.7 0.9  AST '20 15 15 15  ' ALT '16 15 11 11  ' ALKPHOS 45 47 46 43  PROT 7.2 6.1* 6.3* 5.7*  ALBUMIN 4.1 3.4* 3.5 3.3*    Assessment and Plan:  History of acute CVA s/p cerebral arteriogram with emergent mechanical thrombectomy of right MCA superior division occlusion achieving a TICI 2c revascularization via right femoral approach 05/10/2020 by Dr. DevEstanislado Pandyatient's condition stable- awake and alert, follows simple commands, moving all extremities with weakness of left side (can lift LLE but cannot hold, minimal flicker/shoulder movements of LUE). Right femoral puncture site stable, distal pulses (DPs) palpable bilaterally. Further plans per neurology/CCM- appreciate and agree with management.  Please call NIR with questions/concerns.   Electronically Signed: AleEarley AbideA-C 05/12/2020, 9:25 AM   I spent a total of 15 Minutes at the the patient's bedside AND on the patient's hospital floor or unit, greater than 50%  of which was counseling/coordinating care for CVA s/p revascularization.

## 2020-05-12 NOTE — Evaluation (Signed)
Occupational Therapy Evaluation Patient Details Name: Thomas Sampson MRN: [id number] DOB: 02-14-1945 Today's Date: 05/12/2020    History of Present Illness 75 year-old male presented to [facility] on 4/11 with new left-sided weakness and slurred speech. 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  On 4/11 he was emergently underwent revascularization of occluded superior division right MCA through mechanical thrombectomy.  ETT 4/11-/12. Significant PMH: CKD stage IV, DM2.   Clinical Impression   Pt admitted with above. He demonstrates the below listed deficits and will benefit from continued OT to maximize safety and independence with BADLs.  Pt presents to OT with Lt hemiparesis, impaired cognition, Lt visual field deficit, Lt inattention, impaired balance.   He currently requires min - max A for ADLs, and mod  A +2 for functional transfers.  He lives with his wife, and was independent with ADLs PTA.  Recommend CIR level rehab.       Follow Up Recommendations  CIR    Equipment Recommendations  None recommended by OT    Recommendations for Other Services Rehab consult     Precautions / Restrictions Precautions Precautions: Fall;Other (comment) Precaution Comments: L inattention Restrictions Weight Bearing Restrictions: No      Mobility Bed Mobility Overal bed mobility: Needs Assistance Bed Mobility: Supine to Sit     Supine to sit: Mod assist     General bed mobility comments: Cues for initiating BLE's off edge of bed, modA for trunk to upright and guarding LUE    Transfers Overall transfer level: Needs assistance Equipment used: 1 person hand held assist Transfers: Sit to/from Stand Sit to Stand: Mod assist;+2 physical assistance         General transfer comment: ModA + 2 to stand from edge of bed. Pt demonstrating increased L knee flexion, but no  buckle, increased trunk/hip flexion, and left lateral lean. Able to correct with multimodal cues, but not maintain.    Balance Overall balance assessment: Needs assistance Sitting-balance support: Feet supported Sitting balance-Leahy Scale: Fair Sitting balance - Comments: Able to sit unsupported with supervision, left lateral lean with challenge Postural control: Left lateral lean Standing balance support: Single extremity supported Standing balance-Leahy Scale: Poor Standing balance comment: Left lateral lean, modA +2 for standing balance.  Requires cues/faciliation for hip and knee extension                           ADL either performed or assessed with clinical judgement   ADL Overall ADL's : Needs assistance/impaired Eating/Feeding: Set up;Sitting;Bed level   Grooming: Wash/dry hands;Wash/dry face;Oral care;Brushing hair;Minimal assistance;Sitting Grooming Details (indicate cue type and reason): sitting EOB Upper Body Bathing: Moderate assistance;Sitting   Lower Body Bathing: Maximal assistance;Sit to/from stand   Upper Body Dressing : Moderate assistance;Sitting   Lower Body Dressing: Maximal assistance;Sit to/from stand Lower Body Dressing Details (indicate cue type and reason): Pt able to don socks with mod A Toilet Transfer: Maximal assistance;+2 for physical assistance;+2 for safety/equipment;BSC   Toileting- Clothing Manipulation and Hygiene: Maximal assistance;Sit to/from stand       Functional mobility during ADLs: Moderate assistance;+2 for physical assistance;+2 for safety/equipment General ADL Comments: Pt limited by Lt hemiparesis and Lt inattention as well as visual field deficit     Vision Baseline Vision/History: Wears glasses Wears Glasses: Reading only Additional Comments: Vision assessed through function this date.  He was able to locate and find the clock on his Rt and read it correctly.  He was able to locate therapist's nametag  immediately to his Lt and read it correctly.  He required mod cues to locate his heart monitor to his far left, and when looking straight ahead acknowledges he can't see the monitor in his periphery.  Will benefit from further,formal assessment     Perception Perception Perception Tested?: Yes Perception Deficits: Inattention/neglect Inattention/Neglect: Does not attend to left side of body;Does not attend to left visual field Spatial deficits: requires min cues to locate Lt UE and to turn head to Frank tested?: Within functional limits    Pertinent Vitals/Pain Pain Assessment: No/denies pain     Hand Dominance Left   Extremity/Trunk Assessment Upper Extremity Assessment Upper Extremity Assessment: LUE deficits/detail LUE Deficits / Details: Pt demonstrates movement Lt UE consistent with Brunnstrom stage 2. He is able to initiate minimal movement Lt shoulder LUE Coordination: decreased fine motor;decreased gross motor   Lower Extremity Assessment Lower Extremity Assessment: Defer to PT evaluation LLE Deficits / Details: Grossly 4/5 LLE Sensation: history of peripheral neuropathy LLE Coordination: decreased gross motor   Cervical / Trunk Assessment Cervical / Trunk Assessment: Other exceptions Cervical / Trunk Exceptions: decreased trunk control Lt   Communication Communication Communication: Expressive difficulties;Other (comment) (dysarthria)   Cognition Arousal/Alertness: Awake/alert Behavior During Therapy: WFL for tasks assessed/performed Overall Cognitive Status: Impaired/Different from baseline Area of Impairment: Following commands;Safety/judgement;Awareness;Problem solving                       Following Commands: Follows one step commands consistently;Follows multi-step commands inconsistently Safety/Judgement: Decreased awareness of safety;Decreased awareness of deficits Awareness: Emergent Problem Solving: Requires verbal  cues General Comments: Pt alert, joking with family and therapists. Decreased insight into safety/deficits, requiring cues for attending and locating objects on the left side. Able to correct left lateral lean with multimodal cues, but not maintain. Pt stating he is not tired during session, but then falling asleep quickly after completing.   General Comments  VSS.  wife and son present    Exercises     Shoulder Instructions      Home Living Family/patient expects to be discharged to:: Private residence Living Arrangements: Spouse/significant other Available Help at Discharge: Family;Available 24 hours/day Type of Home: House Home Access: Ramped entrance     Home Layout: One level     Bathroom Shower/Tub: Walk-in shower         Home Equipment: Shower seat - built in;Walker - 2 wheels          Prior Functioning/Environment Level of Independence: Independent        Comments: Retired, used to work in Psychologist, occupational for [employer]        OT Problem List: Decreased strength;Decreased range of motion;Decreased activity tolerance;Impaired balance (sitting and/or standing);Impaired vision/perception;Decreased cognition;Decreased coordination;Decreased safety awareness;Decreased knowledge of precautions;Decreased knowledge of use of DME or AE;Impaired UE functional use      OT Treatment/Interventions: Self-care/ADL training;Neuromuscular education;DME and/or AE instruction;Therapeutic activities;Cognitive remediation/compensation;Visual/perceptual remediation/compensation;Patient/family education;Balance training;Manual therapy;Splinting    OT Goals(Current goals can be found in the care plan section) Acute Rehab OT Goals Patient Stated Goal: to be able to take care of self OT Goal Formulation: With patient/family Time For Goal Achievement: 05/26/20 Potential to Achieve Goals: Good ADL Goals Pt Will Perform Grooming: with set-up;with supervision;sitting Pt Will  Perform Upper Body Bathing: with min  assist;sitting Pt Will Perform Lower Body Bathing: with mod assist;sit to/from stand Pt Will Transfer to Toilet: with mod assist;stand pivot transfer;bedside commode Pt Will Perform Toileting - Clothing Manipulation and hygiene: with mod assist;sit to/from stand Additional ADL Goal #1: Pt will locate ADL items on his Lt wtih min cues Additional ADL Goal #2: Pt will use Lt UE as a stabilizer with min A  OT Frequency: Min 2X/week   Barriers to D/C:            Co-evaluation PT/OT/SLP Co-Evaluation/Treatment: Yes Reason for Co-Treatment: For patient/therapist safety;To address functional/ADL transfers;Complexity of the patient's impairments (multi-system involvement) PT goals addressed during session: Mobility/safety with mobility OT goals addressed during session: ADL's and self-care      AM-PAC OT "6 Clicks" Daily Activity     Outcome Measure Help from another person eating meals?: A Little Help from another person taking care of personal grooming?: A Little Help from another person toileting, which includes using toliet, bedpan, or urinal?: A Lot Help from another person bathing (including washing, rinsing, drying)?: A Lot Help from another person to put on and taking off regular upper body clothing?: A Lot Help from another person to put on and taking off regular lower body clothing?: A Lot 6 Click Score: 14   End of Session Equipment Utilized During Treatment: Gait belt Nurse Communication: Mobility status  Activity Tolerance: Patient tolerated treatment well Patient left: in bed;with call bell/phone within reach;with family/visitor present  OT Visit Diagnosis: Unsteadiness on feet (R26.81);Cognitive communication deficit (R41.841);Hemiplegia and hemiparesis Symptoms and signs involving cognitive functions: Cerebral infarction Hemiplegia - Right/Left: Left Hemiplegia - dominant/non-dominant: Non-Dominant Hemiplegia - caused by: Cerebral  infarction                Time: 1030-1105 OT Time Calculation (min): 35 min Charges:  OT General Charges $OT Visit: 1 Visit OT Evaluation $OT Eval Moderate Complexity: 1 Mod  Thomas Ravert C., OTR/L Acute Rehabilitation Services Pager [phone] Office [phone]   Thomas Sampson 05/12/2020, 3:20 PM

## 2020-05-12 NOTE — Progress Notes (Signed)
NAME:  Thomas Sampson, MRN:  [id number], DOB:  1945-06-14, LOS: 2 ADMISSION DATE:  05/10/2020, CONSULTATION DATE: 4/11 REFERRING MD:  Leonie Man, CHIEF COMPLAINT:  Vent management for respiratory failure and critical care services    History of Present Illness:  This is a 75 year old male who presented initially to [facility] on 4/11 after waking up at 7:30 AM with new left-sided weakness and slurred speech.  He had last been seen normal at 11 PM the night prior.  His initial symptoms improved upon arrival to the emergency room with only left-sided weakness and diminished fine motor skills with a NIH scale of 0.  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  CT perfusion showed a ischemic cord 29 cm in the penumbra he was referred to [facility] for mechanical thrombectomy.  He had clinical decline while at [facility] with decline of NIH stroke scale to 10, right gaze deviation and left visual field cut and left-sided ataxia.  He arrived at [facility] later in the afternoon on 4/11 he was emergently brought to interventional radiology where he underwent revascularization of occluded superior division right MCA through mechanical thrombectomy.  He was admitted to the intensive care postoperatively on mechanical ventilation for further supportive care  Pertinent  Medical History  Hypertension, hyperlipidemia, diabetes, CKD stage IV, GERD, prior renal calculi, claudication.  Significant Hospital Events: Including procedures, antibiotic start and stop dates in addition to other pertinent events   . 4/11 admitted with acute right MCA stroke.  Initial NIH 0, decompensated in the emergency room at [facility] regional with NIH scale of 10.  He was not a thrombolytic candidate given unknown time of initial symptoms.  On time of transfer had right gaze deviation left visual field cut and left-sided ataxia.   Based on CT brain perfusion scan underwent successful right MCA thrombectomy post CT brain showed contrast staining in the right putamen and moderate hyperdensity in the right posterior perisylvian region.  Return to the intensive care on mechanical ventilation . 4/12 extubated. Vomited post-extubation had associated vagal response but resolved w/out intervention. BP elevated as opposed to low from post-op, requiring Cleviprex gtt. F/U MRI/MRA: right MCA territory infarct increased c/w 4/11/ No mass effect. Small amt of petechial hemorrhage and sm vol SAH not changed. Small cortical based infarct over PCA territory . 4/13 looks good off cleviprex oriented X 3 awaiting rehab eval. PCCM off  Interim History / Subjective:  Wants to drink  Objective   Blood pressure (Abnormal) 143/66, pulse 95, temperature 99.4 F (37.4 C), temperature source Axillary, resp. rate 20, height 5\' 9"  (1.753 m), SpO2 96 %.        Intake/Output Summary (Last 24 hours) at 05/12/2020 0733 Last data filed at 05/12/2020 0700 Gross per 24 hour  Intake 1803.06 ml  Output 2050 ml  Net -246.94 ml   There were no vitals filed for this visit.  Examination:  General: Resting in bed no acute distress HEENT normocephalic atraumatic does have facial droop Pulmonary: Clear to auscultation currently 3.5 L via nasal cannula no accessory use Cardiac: Regular rate and rhythm Abdomen: Soft not tender no organomegaly Extremities: Warm dry, left lower extremity a little warmer than right, bilateral lower extremity edema currently undergoing ultrasound of both extremities Neuro awake oriented x3 speech slurred a little dysarthric left upper extremity is weak moving right side well, right lower extremity a little  weaker GU clear yellow  Labs/imaging that I havepersonally reviewed  (right click and "Reselect all SmartList Selections" daily)  I have reviewed see below  Resolved Hospital Problem list   Nausea w/ associated vagal  response 4/12 HR in 40s Acute respiratory failure status post stroke with inability to protect airway and RUL atelectasis/collapse. Extubated 4/12  Drug-induced hypotension.  Suspect this is secondary to sedation Assessment & Plan:   Acute right MCA stroke status post mechanical thrombectomy in interventional radiology Plan BP goal 120-140 Was on Norvasc 2.5mg /d, HCTZ 12.5mg /d, lisinopril 20mg  bid & lopressor 75mg  bid will add back his norvasc today and then decide on bb later if indicated Holding ace-I until renal fxn normalizes more  Serial neuro checks Cont Lyrica to avoid w/d (note was started at 50mg  bid as opposed to home 75mg  bid)   Acute hypoxic respiratory failure in setting of on-going bibasilar post-extubation bilateral atelectasis  Former smoker with a 40 pack year smoking history  Had post-extubation vomiting episode 4/12 but O2 needs stable.  Max temp 99.4; WBC count trending down PCXR personally reviewed showed overall no sig change. Has R>L atelectasis w/ elevated Right HD/ maybe a little more consolidative changes on right but suspect this is d/t low insp efforts Plan Wean oxygen  IS  HOB elevated w/ aspiration and reflux precautions  Chest PT  Mobilize  SLP exam pending. Await results  Acute kidney injury.  Looks like baseline serum creatinine 1.04.  He has had IV dye load, scr peaked at 2.26, seems to be responding to supportive care w/ MIVF Plan Cont MIVFs w/ salineContinue maintenance IV fluids we will continue saline for now (had two orders 125cch and 75cchr->will just keep 125/hr for now) Cont strict I&O Renal dose meds MAP goal > 65 Am chemistry    History of diabetes with what appears to be diabetic neuropathy at baseline A1c >> 8.1 Still w/ sig hyperglycemia  Plan Cont SSI; will inc to resistant scale Lantus inc to 25 Lyrica 50 mg bid  TF coverage   History of hyperlipidemia Plan Am triglyceride Started atorvastatin 4/12   Best Practice  (right click and "Reselect all SmartList Selections" daily)   Diet:  NPO If oral type of diet: NPO Pain/Anxiety/Delirium protocol (if indicated): No VAP protocol (if indicated): Not indicated DVT prophylaxis: Other (comment) GI prophylaxis: PPI Glucose control:  SSI Yes and Basal insulin Yes Central venous access:  N/A Arterial line:  N/A Foley:  N/A Restraints ACTION; IS/IS NOT: is not Mobility:  OOB  PT consulted: Yes Blood pressure target: has <140 Studies pending: none Culture data pending: none Last reviewed culture data:today Antibiotics: none Antibiotic de-escalation: Not indicated Stop date: does not have NA Daily labs: not indicated Code Status:  full code Prognosis: Not life-threatening Last date of multidisciplinary goals of care discussion [NA]  Disposition: Full Code  Critical care will s/o call PRN OK w/ [place] to transfer to floor    Critical care time:   Erick Colace ACNP-BC [place] Pager # [phone] OR # [phone] if no answer

## 2020-05-12 NOTE — Evaluation (Signed)
Clinical/Bedside Swallow Evaluation Patient Details  Name: Thomas Sampson MRN: [id number] Date of Birth: 1945/05/22  Today's Date: 05/12/2020 Time: SLP Start Time (ACUTE ONLY): 6962 SLP Stop Time (ACUTE ONLY): 0941 SLP Time Calculation (min) (ACUTE ONLY): 20 min  Past Medical History:  Past Medical History:  Diagnosis Date  . Chronic kidney disease    kidney stones  . Diabetes mellitus without complication ([place])   . Hyperlipidemia   . Hypertension   . Tinea pedis    Past Surgical History:  Past Surgical History:  Procedure Laterality Date  . COLON SURGERY    . COLONOSCOPY WITH PROPOFOL N/A 09/18/2016   Procedure: COLONOSCOPY WITH PROPOFOL;  Surgeon: Lollie Sails, MD;  Location: [facility] ENDOSCOPY;  Service: Endoscopy;  Laterality: N/A;  . EYE SURGERY     cataract extraction  . IR CT HEAD LTD  05/10/2020  . IR PERCUTANEOUS ART THROMBECTOMY/INFUSION INTRACRANIAL INC DIAG ANGIO  05/10/2020  . RADIOLOGY WITH ANESTHESIA N/A 05/10/2020   Procedure: IR WITH ANESTHESIA;  Surgeon: Radiologist, Medication, MD;  Location: [place];  Service: Radiology;  Laterality: N/A;   HPI:  75 year-old male presented to [facility] on 4/11 with new left-sided weakness and slurred speech. 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  On 4/11 he was emergently underwent revascularization of occluded superior division right MCA through mechanical thrombectomy.  ETT 4/11-/12.   Assessment / Plan / Recommendation Clinical Impression  Concern for impaired laryngeal protection duirng swallow primarily with thin liquids. Signficant CN VII and XII involvement with reduced facial ROM, lingual asymmetry, imprecision for lingual lateralization. Pooling of saliva on left sulci, labial residue not spontaneously removed and incomplete mastication with solid. Verbal cues resulted in continued mastication and  clearance. Suspect either timng or movement is suboptimal leading to immediate cough with thin consistently with each trial. Recommend ice chip after oral hygiene and MBS scheduled today at 1400. SLP Visit Diagnosis: Dysphagia, unspecified (R13.10)    Aspiration Risk  Severe aspiration risk;Moderate aspiration risk    Diet Recommendation NPO;Ice chips PRN after oral care   Medication Administration: Via alternative means    Other  Recommendations Oral Care Recommendations: Oral care QID   Follow up Recommendations        Frequency and Duration            Prognosis        Swallow Study   General HPI: 75 year-old male presented to [facility] on 4/11 with new left-sided weakness and slurred speech. 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  On 4/11 he was emergently underwent revascularization of occluded superior division right MCA through mechanical thrombectomy.  ETT 4/11-/12. Type of Study: Bedside Swallow Evaluation Previous Swallow Assessment:  (none) Diet Prior to this Study: NPO;NG Tube Temperature Spikes Noted: No Respiratory Status: Nasal cannula History of Recent Intubation: Yes Length of Intubations (days): 1 days Date extubated: 05/11/20 Behavior/Cognition: Alert;Cooperative;Pleasant mood;Other (Comment) (labile) Oral Cavity Assessment: Other (comment) (candidias ? will further assess) Oral Care Completed by SLP: Recent completion by staff Oral Cavity - Dentition: Adequate natural dentition Vision: Functional for self-feeding (left neglect) Self-Feeding Abilities: Needs assist Patient Positioning: Upright in bed Baseline Vocal Quality: Low vocal intensity Volitional Cough: Strong Volitional Swallow: Able to elicit    Oral/Motor/Sensory Function Overall Oral Motor/Sensory Function:  Moderate impairment Facial ROM: Reduced left;Suspected CN VII  (facial) dysfunction Facial Symmetry: Abnormal symmetry left;Suspected CN VII (facial) dysfunction Facial Strength: Reduced left;Suspected CN VII (facial) dysfunction Facial Sensation: Other (Comment) (states no sensation loss, SLP suspects) Lingual ROM: Reduced left;Suspected CN XII (hypoglossal) dysfunction Lingual Symmetry: Abnormal symmetry left;Suspected CN XII (hypoglossal) dysfunction Lingual Strength: Reduced;Suspected CN XII (hypoglossal) dysfunction Mandible: Within Functional Limits   Ice Chips Ice chips: Impaired Oral Phase Impairments: Reduced lingual movement/coordination;Reduced labial seal Pharyngeal Phase Impairments: Cough - Delayed   Thin Liquid Thin Liquid: Impaired Presentation: Cup Oral Phase Impairments: Reduced labial seal;Reduced lingual movement/coordination Oral Phase Functional Implications: Left anterior spillage Pharyngeal  Phase Impairments: Cough - Immediate    Nectar Thick Nectar Thick Liquid: Not tested   Honey Thick Honey Thick Liquid: Not tested   Puree Puree: Impaired Oral Phase Impairments: Reduced labial seal Oral Phase Functional Implications: Oral residue Pharyngeal Phase Impairments: Other (comments) (none)   Solid     Solid: Impaired Oral Phase Impairments: Impaired mastication;Reduced lingual movement/coordination Oral Phase Functional Implications: Left lateral sulci pocketing;Right lateral sulci pocketing Pharyngeal Phase Impairments:  (none)      Houston Siren 05/12/2020,10:16 AM  Orbie Pyo Colvin Caroli.Ed Risk analyst [phone] Office [phone]

## 2020-05-12 NOTE — Progress Notes (Signed)
Lower extremity venous bilateral study completed.  Preliminary results relayed to Merrilee Seashore, RN.   See CV Proc for preliminary results report.   Darlin Coco, RDMS, RVT

## 2020-05-12 NOTE — Evaluation (Signed)
Physical Therapy Evaluation Patient Details Name: Thomas Sampson MRN: [id number] DOB: 1946-01-16 Today's Date: 05/12/2020   History of Present Illness  75 year-old male presented to [facility] on 4/11 with new left-sided weakness and slurred speech. 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  On 4/11 he was emergently underwent revascularization of occluded superior division right MCA through mechanical thrombectomy.  ETT 4/11-/12. Significant PMH: CKD stage IV, DM2.  Clinical Impression  PTA, pt lives with his wife and is independent. Pt presents with left sided weakness (LUE weaker than LLE), left inattention, poor standing balance, cognitive deficits. Requiring two person moderate assist to stand for extended period of time with multimodal cues for postural re-education, weight shifting, midline positioning. Transfer to chair deferred today as echo arriving. Suspect excellent progress given PLOF, family support, motivation, and activity tolerance. Highly recommending CIR to maximize functional independence and decrease caregiver burden.     Follow Up Recommendations CIR    Equipment Recommendations  3in1 (PT);Wheelchair (measurements PT);Wheelchair cushion (measurements PT)    Recommendations for Other Services       Precautions / Restrictions Precautions Precautions: Fall;Other (comment) Precaution Comments: L inattention Restrictions Weight Bearing Restrictions: No      Mobility  Bed Mobility Overal bed mobility: Needs Assistance Bed Mobility: Supine to Sit     Supine to sit: Mod assist     General bed mobility comments: Cues for initiating BLE's off edge of bed, modA for trunk to upright and guarding LUE    Transfers Overall transfer level: Needs assistance Equipment used: 1 person hand held assist Transfers: Sit to/from Stand Sit to Stand: Mod  assist;+2 physical assistance         General transfer comment: ModA + 2 to stand from edge of bed. Pt demonstrating increased L knee flexion, but no buckle, increased trunk/hip flexion, and left lateral lean. Able to correct with multimodal cues, but not maintain.  Ambulation/Gait                Stairs            Wheelchair Mobility    Modified Rankin (Stroke Patients Only) Modified Rankin (Stroke Patients Only) Pre-Morbid Rankin Score: No symptoms Modified Rankin: Moderately severe disability     Balance Overall balance assessment: Needs assistance Sitting-balance support: Feet supported Sitting balance-Leahy Scale: Fair Sitting balance - Comments: Able to sit unsupported with supervision, left lateral lean with challenge   Standing balance support: Single extremity supported Standing balance-Leahy Scale: Poor Standing balance comment: Left lateral lean, modA +2 for standing balance                             Pertinent Vitals/Pain Pain Assessment: No/denies pain    Home Living Family/patient expects to be discharged to:: Private residence Living Arrangements: Spouse/significant other Available Help at Discharge: Family;Available 24 hours/day Type of Home: House Home Access: Ramped entrance     Home Layout: One level Home Equipment: Shower seat - built in;Walker - 2 wheels      Prior Function Level of Independence: Independent         Comments: Retired, used to work in [employer], occupational for [employer]: Left    Extremity/Trunk Assessment   Upper Extremity Assessment Upper Extremity Assessment: Defer to OT evaluation  Lower Extremity Assessment Lower Extremity Assessment: LLE deficits/detail LLE Deficits / Details: Grossly 4/5 LLE Sensation: history of peripheral neuropathy LLE Coordination: decreased gross motor       Communication   Communication: Receptive difficulties  (dsyarthric)  Cognition Arousal/Alertness: Awake/alert Behavior During Therapy: WFL for tasks assessed/performed Overall Cognitive Status: Impaired/Different from baseline Area of Impairment: Following commands;Safety/judgement;Awareness;Problem solving                       Following Commands: Follows one step commands consistently;Follows multi-step commands inconsistently Safety/Judgement: Decreased awareness of safety;Decreased awareness of deficits Awareness: Emergent Problem Solving: Requires verbal cues General Comments: Pt alert, joking with family and therapists. Decreased insight into safety/deficits, requiring cues for attending and locating objects on the left side. Able to correct left lateral lean with multimodal cues, but not maintain. Pt stating he is not tired during session, but then falling asleep quickly after completing.      General Comments      Exercises     Assessment/Plan    PT Assessment Patient needs continued PT services  PT Problem List Decreased strength;Decreased activity tolerance;Decreased balance;Decreased mobility;Decreased coordination;Decreased cognition;Decreased safety awareness       PT Treatment Interventions DME instruction;Gait training;Stair training;Functional mobility training;Therapeutic exercise;Therapeutic activities;Balance training;Patient/family education;Wheelchair mobility training    PT Goals (Current goals can be found in the Care Plan section)  Acute Rehab PT Goals Patient Stated Goal: agreeable to rehab PT Goal Formulation: With patient/family Time For Goal Achievement: 05/26/20 Potential to Achieve Goals: Good    Frequency Min 4X/week   Barriers to discharge        Co-evaluation PT/OT/SLP Co-Evaluation/Treatment: Yes Reason for Co-Treatment: For patient/therapist safety;To address functional/ADL transfers PT goals addressed during session: Mobility/safety with mobility         AM-PAC PT "6 Clicks"  Mobility  Outcome Measure Help needed turning from your back to your side while in a flat bed without using bedrails?: A Lot Help needed moving from lying on your back to sitting on the side of a flat bed without using bedrails?: A Lot Help needed moving to and from a bed to a chair (including a wheelchair)?: A Lot Help needed standing up from a chair using your arms (e.g., wheelchair or bedside chair)?: A Lot Help needed to walk in hospital room?: A Lot Help needed climbing 3-5 steps with a railing? : Total 6 Click Score: 11    End of Session Equipment Utilized During Treatment: Gait belt Activity Tolerance: Patient tolerated treatment well Patient left: in bed;with call bell/phone within reach;with bed alarm set;Other (comment) (echo in room) Nurse Communication: Mobility status PT Visit Diagnosis: Unsteadiness on feet (R26.81);Difficulty in walking, not elsewhere classified (R26.2);Other symptoms and signs involving the nervous system (R29.898);Hemiplegia and hemiparesis Hemiplegia - Right/Left: Left Hemiplegia - dominant/non-dominant: Non-dominant Hemiplegia - caused by: Cerebral infarction    Time: 1026-1108 PT Time Calculation (min) (ACUTE ONLY): 42 min   Charges:   PT Evaluation $PT Eval Moderate Complexity: 1 Mod PT Treatments $Therapeutic Activity: 8-22 mins        Wyona Almas, PT, DPT Acute Rehabilitation Services Pager [phone] Office [phone]   Deno Etienne 05/12/2020, 2:17 PM

## 2020-05-12 NOTE — Progress Notes (Addendum)
STROKE TEAM PROGRESS NOTE   INTERVAL HISTORY Thomas Sampson was successfully extubated yesterday and Thomas Sampson is verbal this AM. Thomas Sampson continues to have gaze preference to the right. Thomas Sampson is aware that Thomas Sampson will be started on ASA+ Plavix for 3 mon then continue on Plavix alone and that these medications are "blood thinners" but also ask if they work for pain. Thomas Sampson informed that they do not, and Thomas Sampson reassures that Thomas Sampson is not in pain, but Thomas Sampson thought they did both. 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   Basic Metabolic Panel:  Recent Labs  Lab 05/11/20 0405 05/12/20 0444  NA 138 145  K 4.2 3.5  CL 102 111  CO2 21* 26  GLUCOSE 409* 213*  BUN 28* 27*  CREATININE 2.26* 2.05*  CALCIUM 8.7* 8.3*  MG  --  1.5*   Lipid Panel:  Recent Labs  Lab 05/11/20 0405  CHOL 166  TRIG 208*  204*  HDL 33*  CHOLHDL 5.0  VLDL 42*  LDLCALC 91   HgbA1c:  Recent Labs  Lab 05/11/20 0405  HGBA1C 8.2*   IMAGING past 24 hours MR ANGIO HEAD WO CONTRAST  Result Date: 05/11/2020 CLINICAL DATA:  Stroke, follow-up. EXAM: MRI HEAD WITHOUT CONTRAST MRA HEAD WITHOUT CONTRAST TECHNIQUE: Multiplanar, multiecho pulse sequences of the brain and surrounding structures were obtained without intravenous contrast. Angiographic images of the  head were obtained using MRA technique without contrast. COMPARISON:  Head CT 05/11/2020. Images from thrombectomy 05/10/2020. Noncontrast head CT, CT angiogram head/neck and CT perfusion 05/10/2020. MRI/MRA head 05/10/2020. Small chronic infarct within the right cerebellar hemisphere. FINDINGS: MRI HEAD FINDINGS Brain: Mild-to-moderate cerebral atrophy. Comparatively mild cerebellar atrophy. Acute cortical/subcortical infarct within the right MCA vascular territory. The dominant component of the infarct is present within the right frontal operculum and right insula and now measures 4.7 x 3.9 x 4.2 cm. However, there are additional cortical/subcortical acute infarcts more superiorly and posteriorly within the right frontal lobe and right parietal lobe (with involvement of the pre and postcentral gyri). Additional small acute infarcts are also present within the right caudate nucleus and right frontal lobe more anteriorly. SWI signal loss along the right sylvian fissure, likely reflecting a combination of petechial hemorrhage and the previously demonstrated small-volume acute subarachnoid hemorrhage at this site. Additional petechial hemorrhage within the right caudate nucleus infarct. Additional petechial hemorrhage versus chronic microhemorrhages within the right frontal operculum and right frontal lobe white matter. There is no significant mass effect at this time. No midline shift. Small acute cortically based infarct within the right occipital lobe (PCA  vascular territory). Punctate acute infarct within the left occipital lobe (PCA vascular territory) (series 5, image 70). Background mild multifocal T2/FLAIR hyperintensity within the cerebral white matter is nonspecific, but compatible with chronic small vessel ischemic disease. Redemonstrated small chronic infarct within the right cerebellar hemisphere. No evidence of intracranial mass. No extra-axial fluid collection. Vascular: Expected proximal arterial flow  voids. Skull and upper cervical spine: No focal marrow lesion. Sinuses/Orbits: Visualized orbits show no acute finding. Trace scattered paranasal sinus mucosal thickening at the imaged levels. MRA HEAD FINDINGS: The intracranial internal carotid arteries are patent. The M1 middle cerebral arteries are patent. High-grade focal stenosis with possible short-segment flow gap at site of previously demonstrated mid to distal right M2 MCA branch occlusion (series 10, images 125-134) (series 1081, image 3). The anterior cerebral arteries are patent. The intracranial vertebral arteries are patent. The basilar artery is patent. The posterior cerebral arteries are patent. A right posterior communicating artery is present. The left posterior communicating artery is hypoplastic or absent. No intracranial aneurysm is identified. IMPRESSION: MRI brain: 1. Acute right MCA vascular territory infarct as described. This has significantly increased in extent as compared to the brain MRI of 05/10/2020, and may have subtly increased in extent as compared to the head CT performed earlier today. No significant mass effect at this time 2. SWI signal loss along the right sylvian fissure which likely reflects a combination of petechial hemorrhage and the small-volume acute subarachnoid hemorrhage previously demonstrated at this site. 3. Small acute cortically-based infarct within the right occipital lobe (PCA vascular territory). 4. Punctate acute cortical infarct within the left occipital lobe (PCA vascular territory). 5. Background chronic findings without interval change, as described. MRA head: 1. High-grade focal stenosis with possible short-segment flow gap at site of previously demonstrated mid-to-distal right M2 MCA branch occlusion. 2. Elsewhere, no intracranial large vessel occlusion or proximal high-grade arterial stenosis is identified. Electronically Signed   By: Kellie Simmering DO   On: 05/11/2020 18:10   MR BRAIN WO  CONTRAST  Result Date: 05/11/2020 CLINICAL DATA:  Stroke, follow-up. EXAM: MRI HEAD WITHOUT CONTRAST MRA HEAD WITHOUT CONTRAST TECHNIQUE: Multiplanar, multiecho pulse sequences of the brain and surrounding structures were obtained without intravenous contrast. Angiographic images of the head were obtained using MRA technique without contrast. COMPARISON:  Head CT 05/11/2020. Images from thrombectomy 05/10/2020. Noncontrast head CT, CT angiogram head/neck and CT perfusion 05/10/2020. MRI/MRA head 05/10/2020. Small chronic infarct within the right cerebellar hemisphere. FINDINGS: MRI HEAD FINDINGS Brain: Mild-to-moderate cerebral atrophy. Comparatively mild cerebellar atrophy. Acute cortical/subcortical infarct within the right MCA vascular territory. The dominant component of the infarct is present within the right frontal operculum and right insula and now measures 4.7 x 3.9 x 4.2 cm. However, there are additional cortical/subcortical acute infarcts more superiorly and posteriorly within the right frontal lobe and right parietal lobe (with involvement of the pre and postcentral gyri). Additional small acute infarcts are also present within the right caudate nucleus and right frontal lobe more anteriorly. SWI signal loss along the right sylvian fissure, likely reflecting a combination of petechial hemorrhage and the previously demonstrated small-volume acute subarachnoid hemorrhage at this site. Additional petechial hemorrhage within the right caudate nucleus infarct. Additional petechial hemorrhage versus chronic microhemorrhages within the right frontal operculum and right frontal lobe white matter. There is no significant mass effect at this time. No midline shift. Small acute cortically based infarct within the right occipital lobe (PCA vascular territory). Punctate acute infarct within the left occipital lobe (  PCA vascular territory) (series 5, image 70). Background mild multifocal T2/FLAIR hyperintensity  within the cerebral white matter is nonspecific, but compatible with chronic small vessel ischemic disease. Redemonstrated small chronic infarct within the right cerebellar hemisphere. No evidence of intracranial mass. No extra-axial fluid collection. Vascular: Expected proximal arterial flow voids. Skull and upper cervical spine: No focal marrow lesion. Sinuses/Orbits: Visualized orbits show no acute finding. Trace scattered paranasal sinus mucosal thickening at the imaged levels. MRA HEAD FINDINGS: The intracranial internal carotid arteries are patent. The M1 middle cerebral arteries are patent. High-grade focal stenosis with possible short-segment flow gap at site of previously demonstrated mid to distal right M2 MCA branch occlusion (series 10, images 125-134) (series 1081, image 3). The anterior cerebral arteries are patent. The intracranial vertebral arteries are patent. The basilar artery is patent. The posterior cerebral arteries are patent. A right posterior communicating artery is present. The left posterior communicating artery is hypoplastic or absent. No intracranial aneurysm is identified. IMPRESSION: MRI brain: 1. Acute right MCA vascular territory infarct as described. This has significantly increased in extent as compared to the brain MRI of 05/10/2020, and may have subtly increased in extent as compared to the head CT performed earlier today. No significant mass effect at this time 2. SWI signal loss along the right sylvian fissure which likely reflects a combination of petechial hemorrhage and the small-volume acute subarachnoid hemorrhage previously demonstrated at this site. 3. Small acute cortically-based infarct within the right occipital lobe (PCA vascular territory). 4. Punctate acute cortical infarct within the left occipital lobe (PCA vascular territory). 5. Background chronic findings without interval change, as described. MRA head: 1. High-grade focal stenosis with possible short-segment  flow gap at site of previously demonstrated mid-to-distal right M2 MCA branch occlusion. 2. Elsewhere, no intracranial large vessel occlusion or proximal high-grade arterial stenosis is identified. Electronically Signed   By: Kellie Simmering DO   On: 05/11/2020 18:10   DG CHEST PORT 1 VIEW  Result Date: 05/12/2020 CLINICAL DATA:  Aspiration EXAM: PORTABLE CHEST 1 VIEW COMPARISON:  05/11/2020 FINDINGS: Nasogastric tube extends into the upper abdomen. Mild elevation of the right hemidiaphragm is unchanged with associated right basilar atelectasis. Lungs are otherwise clear. No pneumothorax or pleural effusion. Cardiac size is within normal limits. Apparent mediastinal widening is likely artifactual and related to semi-erect positioning. Pulmonary vascularity is normal. IMPRESSION: Stable mild elevation of the right hemidiaphragm with associated right basilar atelectasis. Electronically Signed   By: Fidela Salisbury MD   On: 05/12/2020 06:11   DG CHEST PORT 1 VIEW  Result Date: 05/11/2020 CLINICAL DATA:  Hypoxia EXAM: PORTABLE CHEST 1 VIEW COMPARISON:  05/10/2020 FINDINGS: Cardiac shadow is stable. Gastric catheter is noted in satisfactory position. Previously seen endotracheal tube is no longer identified. Bibasilar atelectatic changes are noted slightly increased when compared with the prior exam likely accentuated by poor inspiratory effort. No acute bony abnormality is seen. IMPRESSION: Increasing bibasilar atelectasis. Electronically Signed   By: Inez Catalina M.D.   On: 05/11/2020 12:48   DG Abd Portable 1V  Result Date: 05/11/2020 CLINICAL DATA:  Check gastric catheter placement EXAM: PORTABLE ABDOMEN - 1 VIEW COMPARISON:  None. FINDINGS: Gastric catheter is noted within the stomach. No obstructive changes are seen. No free air is noted. IMPRESSION: Gastric catheter within the stomach. Electronically Signed   By: Inez Catalina M.D.   On: 05/11/2020 12:58    PHYSICAL EXAM Gen: NAD, Thomas Sampson,  resting in bed HEENT: Gaze preference to  right, no trauma or deformity noted CV: RRR Resp: Symmetrical chest rise and fall, no acute distress Ext: LLE is mildly swollen from the knee down, SCD has been removed.  Skin: No brusing or lesions noted.   Neurological Exam :  Thomas Sampson is extubated. Awake oriented to person, place, time, and situation. Thomas Sampson speech is mildly dysarthric, but understandable. Thomas Sampson is awake alert Thomas Sampson head is more midline today, but Thomas Sampson does have preference for the right.   Thomas Sampson follows all commands well. 75mm pupils, Thomas Sampson comes to midline but does   look towards the left beyond midline. Thomas Sampson completely turns Thomas Sampson head to look at items to the left only slightly left of midline. Thomas Sampson has moderate left lower facial weakness, with facial droop. Tongue midline. No shoulder shrug on the Left. LLE 3/5 with less drift today. RLE 4/5 can resist pressure.  RUE 5/5. LUE did not really move proximal muslces much today 0/5.Light Touch intact to all extremities today. Thomas Sampson does not have left hemibody neglect.  Some dysmetria in the R, unable to test L FTN 2/2 weakness.Extinction is intact today. Gait not tested  ASSESSMENT/PLAN Thomas Mayer Zacharyis a 75 y.o.malewith medical history significant ofhypertension, hyperlipidemia, diabetes mellitus, GERD, CKD-4, kidney stone, claudication, who [place], slurred speechupon arising from sleep at 7:30 AM, 05/10/2020. Thomas Sampson symptoms initial improved upon arrival to [facility]; MRI concluded that Thomas Sampson had a acute right insular and parietal operculum infarct and MRA showed occlusion of the right M3 superior division branch which was quite large. Thomas Sampson NIHHSS worsened to a 10 and CT Perfusion suggested Thomas Sampson was a candidate for IR thrombectomy, Thomas Sampson was transferred to [facility] for procedure. Thomas Sampson underwent repeat MRI and MRA and was noted to have continued stenosis of the previously  occluded R M2 MCA. MRI noted that Thomas Sampson's increase in infarct size from 4/11 as well as a small acute cortically- based infarct within the R occipital lobe and a punctate cortical infarct in the L occipital lobe. Etiology of strokes is intracranial stenosis. Thomas Sampson was noted to have asymmetrical swelling in LE will assess for DVT. Thomas Sampson is now out of Thomas Sampson 24h window will start Thomas Sampson on ASA+ Plavix for 62mon. 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 Normal appearing brain parenchyma. No acute infarct demonstrated by CT. No mass or hemorrhage.  Slight arterial vascular calcification noted. Areas of paranasal sinus mucosal thickening noted. - CTA: completed -CT perfusion: Abnormal CT perfusion. 29 mm core infarct with additional 33 mL of surrounding penumbra in the right insula and operculum corresponding to restricted diffusion on MRI.  Occluded right superior branch of M3 which is a relatively large vessel compatible with an embolus.   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  MRA : Occlusion of the right M3 branch superior division  Repeat MRA: 1. High-grade focal stenosis with possible short-segment flow gap at site of previously demonstrated mid-to-distal right M2 MCA branch occlusion. 2. Elsewhere, no intracranial large vessel occlusion or proximal high-grade arterial stenosis is identified.   Repeat MRI: 1. Acute right MCA vascular territory infarct as described. This has significantly increased in extent as compared to the brain MRI of 05/10/2020, and may have subtly increased in extent as compared to the head CT performed earlier today. No significant mass effect at this time  2. SWI signal loss along the right sylvian fissure which likely reflects a  combination of petechial hemorrhage and the small-volume acute subarachnoid hemorrhage previously demonstrated at this site. 3. Small acute cortically-based infarct within the right occipital lobe (PCA vascular territory). 4. Punctate acute cortical infarct within the left occipital lobe (PCA vascular territory). 5. Background chronic findings without interval change, as Described.   Extubation was successful 4/12  2D Echo:  pending  LDL 91  HgbA1c 8.2  VTE prophylaxis - SCDs        Diet   Diet NPO time specified         aspirin 81 mg daily prior to admission, now on ASA+ Plavix for 3 months. Continue Plavix alone after 3 mon.   Therapy recommendations:  PT/OT  Disposition:  Pending  Hypertension  Home meds:  Amlodipine 2.5mg  daily, lisinopril 40mg , HCTZ 12.5 daily, metoprolol 100mg   BP goal remains 120-172mmgHg sys  On Celviprex  Hyperlipidemia  LDL 91, goal < 70  S/p CVA will start Atorvastatin 40mg  daily  Continue statin at discharge  Diabetes type II   Home meds:  Glipizide 10mg , metformin 1000mg  BID, Novolog 26 U ac breakfast an 22 U ac supper  HgbA1c 8.2, goal < 7.0  CBGs Recent Labs (last 2 labs)         Recent Labs    05/10/20 2346 05/11/20 0336 05/11/20 0720  GLUCAP 386* 390* 341*    ?    SSI  Other Stroke Risk Factors  Advanced Age >/= 80    Obesity, Body mass index is 32.52 kg/m., BMI >/= 30 associated with increased stroke risk, recommend weight loss, diet and exercise as appropriate   Acute onset LLE edema - venous US DVT of BLE pending   Hospital day # 2  Damita Dunnings, MD PGY-1 I have personally obtained history,examined this Thomas Sampson, reviewed notes, independently viewed imaging studies, participated in medical decision making and plan of care.ROS completed by me personally and pertinent positives fully documented  I have made any additions or clarifications directly to the above note. Agree with note above.   Continue dual antiplatelet therapy for 3 months and aggressive risk factor modification.  Check lower extremity venous ultrasound for DVT.  Mobilize out of bed.  Physical Occupational Therapy consults.  Speech therapy plans to do modified barium this afternoon.  Long discussion at bedside with the Thomas Sampson wife and answered questions about Thomas Sampson care. This Thomas Sampson is critically ill and at significant risk of neurological worsening, death and care requires constant monitoring of vital signs, hemodynamics,respiratory and cardiac monitoring, extensive review of multiple databases, frequent neurological assessment, discussion with family, other specialists and medical decision making of high complexity.I have made any additions or clarifications directly to the above note.This critical care time does not reflect procedure time, or teaching time or supervisory time of PA/NP/Med Resident etc but could involve care discussion time.  I spent 30 minutes of neurocritical care time  in the care of  this Thomas Sampson.      Antony Contras, MD Medical Director [facility] Pager: [phone] 05/12/2020 12:31 PM  To contact Stroke Continuity provider, please refer to [email or web address] After hours, contact General Neurology

## 2020-05-12 NOTE — Progress Notes (Signed)
eLink Physician-Brief Progress Note Patient Name: COLBEY WIRTANEN DOB: 03-13-1945 MRN: [id number]   Date of Service  05/12/2020  HPI/Events of Note  Hypomagnesemia  Hypokalemia - Mg++ = 1.5 and K+ = 3.5. Creatinine = 2.05.  eICU Interventions  Will replace Mg++ and K+.     Intervention Category Major Interventions: Electrolyte abnormality - evaluation and management  Lysle Dingwall 05/12/2020, 6:35 AM

## 2020-05-12 NOTE — Progress Notes (Signed)
Modified Barium Swallow Progress Note  Patient Details  Name: Thomas Sampson MRN: [id number] Date of Birth: [birth year]  Today's Date: 05/12/2020  Modified Barium Swallow completed.  Full report located under Chart Review in the Imaging Section.  Brief recommendations include the following:  Clinical Impression  Oropharyngeal dysphagia exhibited by lingual pumping, decreased cohesion, delayed transit and mistimed sequlae of swallow. Delayed and effortful bolus propulsion with bolus on posterior base of tongue while pt attempting to retract tongue and initiate swallow. There was left anterior spill and stasis with thinner consistencies. Thin barium penetrated vestibule and was aspirated into before protective mechanisms were initiated followed by reflexive cough. A chin tuck did not eliminate or reduce aspiration which occured silently. Nectar thick boluses consistently reached his pyriform sinuses for 4-5 seconds prior to onset. Minimal valleculuar residue. Challenged with nectar with larger sips via straw filling pyriform sinuses which did not enter vestibule. Recommend pt initiate Dys 1 (pureee), nectar, crush pills, check left side oral cavity for pocketing and full assist for strategies.   Swallow Evaluation Recommendations       SLP Diet Recommendations: Dysphagia 1 (Puree) solids;Nectar thick liquid   Liquid Administration via: Cup;Straw   Medication Administration: Crushed with puree   Supervision: Staff to assist with self feeding;Patient able to self feed;Full supervision/cueing for compensatory strategies   Compensations: Minimize environmental distractions;Slow rate;Small sips/bites;Lingual sweep for clearance of pocketing   Postural Changes: Seated upright at 90 degrees   Oral Care Recommendations: Oral care BID   Other Recommendations: Order thickener from pharmacy    Houston Siren 05/12/2020,4:49 PM   Orbie Pyo Colvin Caroli.Ed Chief Technology Officer [phone] Office [phone]

## 2020-05-12 NOTE — Progress Notes (Signed)
Inpatient Rehab Admissions Coordinator Note:   Per therapy recommendations, pt was screened for CIR candidacy by Shann Medal, PT, DPT.  At this time we are recommending a CIR consult and I will place an order per our protocol.  Please contact me with questions.   Shann Medal, PT, DPT [phone] 05/12/20 3:21 PM

## 2020-05-13 DIAGNOSIS — R739 Hyperglycemia, unspecified: Secondary | ICD-10-CM

## 2020-05-13 DIAGNOSIS — G8194 Hemiplegia, unspecified affecting left nondominant side: Secondary | ICD-10-CM

## 2020-05-13 DIAGNOSIS — I63411 Cerebral infarction due to embolism of right middle cerebral artery: Secondary | ICD-10-CM | POA: Diagnosis not present

## 2020-05-13 DIAGNOSIS — E1165 Type 2 diabetes mellitus with hyperglycemia: Secondary | ICD-10-CM

## 2020-05-13 DIAGNOSIS — I6601 Occlusion and stenosis of right middle cerebral artery: Secondary | ICD-10-CM | POA: Diagnosis not present

## 2020-05-13 DIAGNOSIS — I609 Nontraumatic subarachnoid hemorrhage, unspecified: Secondary | ICD-10-CM

## 2020-05-13 LAB — CBC
HCT: 36 % — ABNORMAL LOW (ref 39.0–52.0)
Hemoglobin: 11.6 g/dL — ABNORMAL LOW (ref 13.0–17.0)
MCH: 32 pg (ref 26.0–34.0)
MCHC: 32.2 g/dL (ref 30.0–36.0)
MCV: 99.4 fL (ref 80.0–100.0)
Platelets: 234 10*3/uL (ref 150–400)
RBC: 3.62 MIL/uL — ABNORMAL LOW (ref 4.22–5.81)
RDW: 13.5 % (ref 11.5–15.5)
WBC: 11.9 10*3/uL — ABNORMAL HIGH (ref 4.0–10.5)
nRBC: 0 % (ref 0.0–0.2)

## 2020-05-13 LAB — BASIC METABOLIC PANEL
Anion gap: 7 (ref 5–15)
BUN: 24 mg/dL — ABNORMAL HIGH (ref 8–23)
CO2: 27 mmol/L (ref 22–32)
Calcium: 8.7 mg/dL — ABNORMAL LOW (ref 8.9–10.3)
Chloride: 109 mmol/L (ref 98–111)
Creatinine, Ser: 2.02 mg/dL — ABNORMAL HIGH (ref 0.61–1.24)
GFR, Estimated: 34 mL/min — ABNORMAL LOW (ref >60)
Glucose, Bld: 223 mg/dL — ABNORMAL HIGH (ref 70–99)
Potassium: 3.8 mmol/L (ref 3.5–5.1)
Sodium: 143 mmol/L (ref 135–145)

## 2020-05-13 LAB — GLUCOSE, CAPILLARY
Glucose-Capillary: 144 mg/dL — ABNORMAL HIGH (ref 70–99)
Glucose-Capillary: 176 mg/dL — ABNORMAL HIGH (ref 70–99)
Glucose-Capillary: 219 mg/dL — ABNORMAL HIGH (ref 70–99)
Glucose-Capillary: 228 mg/dL — ABNORMAL HIGH (ref 70–99)
Glucose-Capillary: 286 mg/dL — ABNORMAL HIGH (ref 70–99)
Glucose-Capillary: 299 mg/dL — ABNORMAL HIGH (ref 70–99)

## 2020-05-13 MED ORDER — ASPIRIN 81 MG PO CHEW
324.0000 mg | CHEWABLE_TABLET | Freq: Every day | ORAL | Status: DC
  Administered 2020-05-14: 324 mg
  Filled 2020-05-13: qty 4

## 2020-05-13 MED ORDER — INSULIN ASPART 100 UNIT/ML ~~LOC~~ SOLN
8.0000 [IU] | Freq: Three times a day (TID) | SUBCUTANEOUS | Status: DC
  Administered 2020-05-13 – 2020-05-14 (×3): 8 [IU] via SUBCUTANEOUS

## 2020-05-13 MED ORDER — INSULIN GLARGINE 100 UNIT/ML ~~LOC~~ SOLN
35.0000 [IU] | Freq: Every day | SUBCUTANEOUS | Status: DC
  Administered 2020-05-13: 35 [IU] via SUBCUTANEOUS
  Filled 2020-05-13 (×2): qty 0.35

## 2020-05-13 MED ORDER — CHLORHEXIDINE GLUCONATE 0.12 % MT SOLN: 15.0000 mL | Freq: Two times a day (BID) | OROMUCOSAL | Status: DC

## 2020-05-13 MED ORDER — ORAL CARE MOUTH RINSE
15.0000 mL | Freq: Two times a day (BID) | OROMUCOSAL | Status: DC
  Administered 2020-05-13 – 2020-05-14 (×2): 15 mL via OROMUCOSAL

## 2020-05-13 MED ORDER — ASPIRIN EC 325 MG PO TBEC: 325.0000 mg | DELAYED_RELEASE_TABLET | Freq: Every day | ORAL | Status: DC

## 2020-05-13 MED ORDER — PANTOPRAZOLE SODIUM 40 MG PO PACK
40.0000 mg | PACK | Freq: Every day | ORAL | Status: DC
  Administered 2020-05-13: 40 mg via ORAL
  Filled 2020-05-13 (×2): qty 20

## 2020-05-13 MED ORDER — PANTOPRAZOLE SODIUM 40 MG PO TBEC
40.0000 mg | DELAYED_RELEASE_TABLET | Freq: Every day | ORAL | Status: DC
  Administered 2020-05-13 – 2020-05-14 (×2): 40 mg via ORAL
  Filled 2020-05-13: qty 1

## 2020-05-13 MED ORDER — INSULIN ASPART 100 UNIT/ML ~~LOC~~ SOLN
0.0000 [IU] | Freq: Three times a day (TID) | SUBCUTANEOUS | Status: DC
  Administered 2020-05-14: 11 [IU] via SUBCUTANEOUS

## 2020-05-13 NOTE — Progress Notes (Signed)
Inpatient Diabetes Program Recommendations  AACE/ADA: New Consensus Statement on Inpatient Glycemic Control   Target Ranges:  Prepandial:   less than 140 mg/dL      Peak postprandial:   less than 180 mg/dL (1-2 hours)      Critically ill patients:  140 - 180 mg/dL   Results for Thomas Sampson, Thomas Sampson (MRN [id number]) as of 05/13/2020 08:12  Ref. Range 05/12/2020 07:30 05/12/2020 11:59 05/12/2020 15:17 05/12/2020 19:34 05/12/2020 23:23 05/13/2020 03:15 05/13/2020 07:31  Glucose-Capillary Latest Ref Range: 70 - 99 mg/dL 240 (H) 232 (H) 141 (H) 239 (H) 242 (H) 219 (H) 176 (H)   Review of Glycemic Control  Diabetes history: DM2 Outpatient Diabetes medications: Glipizide XL 10 mg QHS, 70/30 42 units QAM, 70/30 52 units QPM, Novolog 26 units with breakfast, 26 units with lunch, and 22 units with supper Current orders for Inpatient glycemic control: Lantus 25 units daily, Novolog 0-20 units Q4H, Novolog 5 units Q4H  Inpatient Diabetes Program Recommendations:    Insulin:  Please consider increasing Lantus to 35 units daily, discontinue Novolog 5 units Q4H, and order Novolog 8 units TID with meals if patient eats at least 50% of meals.  NOTE: In reviewing chart, noted patient sees Dr. Gabriel Carina (Endocrinology) and was last seen on 05/07/20. Per office note on 05/07/20, patient was told to increase 70/30 to 42 units QAM and 52 units QPM. Once patient uses up 70/30 on hand, he was asked to start Tresiba 54 units daily and Novolog 20-35 units BID with meals (dose based on glucose).   Thanks, Barnie Alderman, RN, MSN, CDE Diabetes Coordinator Inpatient Diabetes Program [phone] (Team Pager from 8am to 5pm)

## 2020-05-13 NOTE — Progress Notes (Signed)
Patient transferred to 3W at this time without complications. Report given to [facility]. Wife and brother in accompaniment. VSS Patient and family educated with good understanding verbalized.

## 2020-05-13 NOTE — Consult Note (Signed)
Physical Medicine and Rehabilitation Consult Reason for Consult: Left side weakness and slurred speech Referring Physician: Dr. Leonie Man   HPI: Thomas Sampson is a 75 y.o. right-handed male with history of chronic kidney disease stage IV, diabetes mellitus, hypertension, hyperlipidemia, tobacco abuse.  Per chart review patient lives with spouse.  Reportedly independent prior to admission retired.  1 level home with ramped entrance.  Presented 05/10/2020 to [facility] with left-sided weakness and slurred speech of acute onset.  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  No significant carotid or vertebral artery stenosis in the neck.  Admission chemistries unremarkable except glucose 207, BUN 31, creatinine 2.26, hemoglobin A1c 8.1.  Echocardiogram with ejection fraction of 70 to 75% no wall motion abnormalities.  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  Lower extremity Dopplers negative for DVT.  Initially maintained on Cleviprex for blood pressure control.  Neurology follow-up currently maintained on aspirin 81 mg plus Plavix for 3 months then Plavix alone.  Dysphagia #1 nectar thick liquid diet.  Therapy evaluations completed due to patient's left-sided weakness and slurred speech recommendations of physical medicine rehab consult.   Review of Systems  Constitutional: Negative for chills and fever.  HENT: Negative for hearing loss.   Eyes: Negative for blurred vision and double vision.  Respiratory: Negative for cough and shortness of breath.   Cardiovascular: Negative for chest pain, palpitations and leg swelling.  Gastrointestinal: Positive for constipation. Negative for heartburn and nausea.  Genitourinary: Negative for dysuria, flank pain and  hematuria.  Musculoskeletal: Positive for joint pain and myalgias.  Skin: Negative for rash.  Neurological: Positive for speech change and weakness.  All other systems reviewed and are negative.  Past Medical History:  Diagnosis Date  . Chronic kidney disease    kidney stones  . Diabetes mellitus without complication ([place])   . Hyperlipidemia   . Hypertension   . Tinea pedis    Past Surgical History:  Procedure Laterality Date  . COLON SURGERY    . COLONOSCOPY WITH PROPOFOL N/A 09/18/2016   Procedure: COLONOSCOPY WITH PROPOFOL;  Surgeon: Lollie Sails, MD;  Location: [facility] ENDOSCOPY;  Service: Endoscopy;  Laterality: N/A;  . EYE SURGERY     cataract extraction  . IR CT HEAD LTD  05/10/2020  . IR PERCUTANEOUS ART THROMBECTOMY/INFUSION INTRACRANIAL INC DIAG ANGIO  05/10/2020  . RADIOLOGY WITH ANESTHESIA N/A 05/10/2020   Procedure: IR WITH ANESTHESIA;  Surgeon: Radiologist, Medication, MD;  Location: [facility];  Service: Radiology;  Laterality: N/A;   Family History  Problem Relation Age of Onset  . Cancer Mother   . Heart attack Father   . Heart attack Sister   . Heart disease Sister   . Diabetes Brother    Social History:  reports that he has quit smoking. His smoking use included cigarettes. He quit after 40.00 years of use. He has never used smokeless tobacco. He reports that he does not drink alcohol and does not use drugs. Allergies:  Allergies  Allergen Reactions  . Demerol [Meperidine]    Medications Prior to Admission  Medication Sig Dispense Refill  . Alpha-Lipoic Acid 600 MG TABS Take by mouth.    Marland Kitchen amLODipine (NORVASC) 2.5 MG tablet Take 2.5 mg by mouth daily.    Marland Kitchen aspirin EC  81 MG tablet Take by mouth.    . calcitRIOL (ROCALTROL) 0.25 MCG capsule Take 0.25 mcg by mouth daily.    . Cholecalciferol 2000 units CAPS Take 2,000 Units by mouth daily.    . cholestyramine (QUESTRAN) 4 GM/DOSE powder Take by mouth 2 (two) times daily with a meal. (Patient not taking: No  sig reported)    . colestipol (COLESTID) 1 g tablet Take 1 g by mouth 2 (two) times daily.    . cyanocobalamin (,VITAMIN B-12,) 1000 MCG/ML injection Inject 37ml IM once very 4 weeks then once a month for 4 months. (Patient not taking: No sig reported)    . cyanocobalamin 1000 MCG tablet Take 1,000 mcg by mouth daily.    Marland Kitchen gabapentin (NEURONTIN) 300 MG capsule Take by mouth 3 (three) times daily.     Marland Kitchen glipiZIDE (GLUCOTROL XL) 10 MG 24 hr tablet Take 10 mg by mouth at bedtime.    Marland Kitchen glucose blood (ONETOUCH ULTRA) test strip Use 2 (two) times daily Dx E11.9    . hydrochlorothiazide (HYDRODIURIL) 12.5 MG tablet Take 12.5 mg by mouth daily.    . insulin aspart (NOVOLOG) 100 UNIT/ML injection Inject 26 Units into the skin AC breakfast. take 26 units before breakfast and 22 units before supper (Patient not taking: No sig reported)    . insulin aspart protamine - aspart (NOVOLOG 70/30 MIX) (70-30) 100 UNIT/ML FlexPen 42-52 Units. 42 units qam and 52 qhs    . Insulin Pen Needle (BD PEN NEEDLE NANO U/F) 32G X 4 MM MISC USE WITH INJECTIONS 2 TIMES DAILY    . lisinopril (PRINIVIL,ZESTRIL) 40 MG tablet Take 40 mg by mouth daily. Take 20 mg twice a day    . metFORMIN (GLUCOPHAGE-XR) 500 MG 24 hr tablet Take 1,000 mg by mouth 2 (two) times daily. (Patient not taking: No sig reported)    . metoprolol succinate (TOPROL-XL) 100 MG 24 hr tablet Take 100 mg by mouth daily. Take with or immediately following a meal.    . pantoprazole (PROTONIX) 40 MG tablet Take 40 mg by mouth daily.    . pregabalin (LYRICA) 75 MG capsule Take 75 mg by mouth 2 (two) times daily.      Home: Home Living Family/patient expects to be discharged to:: Private residence Living Arrangements: Spouse/significant other Available Help at Discharge: Family,Available 24 hours/day Type of Home: House Home Access: [place]: One level Bathroom Shower/Tub: [place]: [place] - built in,[place] - 2  wheels  Functional History: Prior Function Level of Independence: Independent Comments: Retired, used to work in [employer], occupational for [employer] [place]:  Mobility: [place] bed mobility: Needs Assistance Bed Mobility: Supine to Sit Supine to sit: Mod assist General bed mobility comments: Cues for initiating BLE's off edge of bed, modA for trunk to upright and guarding LUE Transfers Overall transfer level: Needs assistance Equipment used: 1 person hand held assist Transfers: Sit to/from Stand Sit to Stand: Mod assist,+2 physical assistance General transfer comment: ModA + 2 to stand from edge of bed. Pt demonstrating increased L knee flexion, but no buckle, increased trunk/hip flexion, and left lateral lean. Able to correct with multimodal cues, but not maintain.      ADL: ADL Overall ADL's : Needs assistance/impaired Eating/Feeding: Set up,Sitting,Bed level Grooming: Wash/dry hands,Wash/dry face,Oral care,Brushing hair,Minimal assistance,Sitting Grooming Details (indicate cue type and reason): sitting EOB Upper Body Bathing: Moderate assistance,Sitting Lower Body Bathing: Maximal assistance,Sit to/from stand Upper Body  Dressing : Moderate assistance,Sitting Lower Body Dressing: Maximal assistance,Sit to/from stand Lower Body Dressing Details (indicate cue type and reason): Pt able to don socks with mod A Toilet Transfer: Maximal assistance,+2 for physical assistance,+2 for safety/equipment,BSC Toileting- Clothing Manipulation and Hygiene: Maximal assistance,Sit to/from stand Functional mobility during ADLs: Moderate assistance,+2 for physical assistance,+2 for safety/equipment General ADL Comments: Pt limited by Lt hemiparesis and Lt inattention as well as visual field deficit  Cognition: Cognition Overall Cognitive Status: Impaired/Different from baseline Orientation Level: Oriented X4 Cognition Arousal/Alertness: Awake/alert Behavior During  Therapy: WFL for tasks assessed/performed Overall Cognitive Status: Impaired/Different from baseline Area of Impairment: Following commands,Safety/judgement,Awareness,Problem solving Following Commands: Follows one step commands consistently,Follows multi-step commands inconsistently Safety/Judgement: Decreased awareness of safety,Decreased awareness of deficits Awareness: Emergent Problem Solving: Requires verbal cues General Comments: Pt alert, joking with family and therapists. Decreased insight into safety/deficits, requiring cues for attending and locating objects on the left side. Able to correct left lateral lean with multimodal cues, but not maintain. Pt stating he is not tired during session, but then falling asleep quickly after completing.  Blood pressure 125/73, pulse 65, temperature 99.3 F (37.4 C), temperature source Oral, resp. rate (!) 21, height 5\' 9"  (1.753 m), SpO2 98 %. Physical Exam Constitutional:      General: He is not in acute distress.    Appearance: He is obese.  HENT:     Head: Normocephalic and atraumatic.     Nose: Nose normal.     Mouth/Throat:     Mouth: Mucous membranes are moist.     Comments: Thrush on tongue Eyes:     Extraocular Movements: Extraocular movements intact.     Conjunctiva/sclera: Conjunctivae normal.     Pupils: Pupils are equal, round, and reactive to light.  Cardiovascular:     Rate and Rhythm: Normal rate.     Pulses: Normal pulses.  Pulmonary:     Effort: Pulmonary effort is normal.  Abdominal:     Palpations: Abdomen is soft.  Musculoskeletal:        General: Normal range of motion.  Skin:    General: Skin is warm.  Neurological:     Comments: Dysarthric speech. Mild left inattention. Left central 7 and tongue deviation. RUE and RLE 4-5/5. LUE tr-1/5 deltoid, pecs, tr biceps, 0/5 elsewhere. LLE: 2+ to 3-/5 prox to distal. Senses pain and light touch although fine touch discrimination diminished on soles of both feet.    Psychiatric:     Comments: Overall pleasant, frequently displayed pseudobulbar affect     Results for orders placed or performed during the hospital encounter of 05/10/20 (from the past 24 hour(s))  Glucose, capillary     Status: Abnormal   Collection Time: 05/12/20  7:30 AM  Result Value Ref Range   Glucose-Capillary 240 (H) 70 - 99 mg/dL  Glucose, capillary     Status: Abnormal   Collection Time: 05/12/20 11:59 AM  Result Value Ref Range   Glucose-Capillary 232 (H) 70 - 99 mg/dL  Glucose, capillary     Status: Abnormal   Collection Time: 05/12/20  3:17 PM  Result Value Ref Range   Glucose-Capillary 141 (H) 70 - 99 mg/dL  Glucose, capillary     Status: Abnormal   Collection Time: 05/12/20  7:34 PM  Result Value Ref Range   Glucose-Capillary 239 (H) 70 - 99 mg/dL  Glucose, capillary     Status: Abnormal   Collection Time: 05/12/20 11:23 PM  Result Value Ref Range   Glucose-Capillary  242 (H) 70 - 99 mg/dL  CBC     Status: Abnormal   Collection Time: 05/13/20  2:29 AM  Result Value Ref Range   WBC 11.9 (H) 4.0 - 10.5 K/uL   RBC 3.62 (L) 4.22 - 5.81 MIL/uL   Hemoglobin 11.6 (L) 13.0 - 17.0 g/dL   HCT 36.0 (L) 39.0 - 52.0 %   MCV 99.4 80.0 - 100.0 fL   MCH 32.0 26.0 - 34.0 pg   MCHC 32.2 30.0 - 36.0 g/dL   RDW 13.5 11.5 - 15.5 %   Platelets 234 150 - 400 K/uL   nRBC 0.0 0.0 - 0.2 %  Basic metabolic panel     Status: Abnormal   Collection Time: 05/13/20  2:29 AM  Result Value Ref Range   Sodium 143 135 - 145 mmol/L   Potassium 3.8 3.5 - 5.1 mmol/L   Chloride 109 98 - 111 mmol/L   CO2 27 22 - 32 mmol/L   Glucose, Bld 223 (H) 70 - 99 mg/dL   BUN 24 (H) 8 - 23 mg/dL   Creatinine, Ser 2.02 (H) 0.61 - 1.24 mg/dL   Calcium 8.7 (L) 8.9 - 10.3 mg/dL   GFR, Estimated 34 (L) >60 mL/min   Anion gap 7 5 - 15  Glucose, capillary     Status: Abnormal   Collection Time: 05/13/20  3:15 AM  Result Value Ref Range   Glucose-Capillary 219 (H) 70 - 99 mg/dL   MR ANGIO HEAD WO  CONTRAST  Result Date: 05/11/2020 CLINICAL DATA:  Stroke, follow-up. EXAM: MRI HEAD WITHOUT CONTRAST MRA HEAD WITHOUT CONTRAST TECHNIQUE: Multiplanar, multiecho pulse sequences of the brain and surrounding structures were obtained without intravenous contrast. Angiographic images of the head were obtained using MRA technique without contrast. COMPARISON:  Head CT 05/11/2020. Images from thrombectomy 05/10/2020. Noncontrast head CT, CT angiogram head/neck and CT perfusion 05/10/2020. MRI/MRA head 05/10/2020. Small chronic infarct within the right cerebellar hemisphere. FINDINGS: MRI HEAD FINDINGS Brain: Mild-to-moderate cerebral atrophy. Comparatively mild cerebellar atrophy. Acute cortical/subcortical infarct within the right MCA vascular territory. The dominant component of the infarct is present within the right frontal operculum and right insula and now measures 4.7 x 3.9 x 4.2 cm. However, there are additional cortical/subcortical acute infarcts more superiorly and posteriorly within the right frontal lobe and right parietal lobe (with involvement of the pre and postcentral gyri). Additional small acute infarcts are also present within the right caudate nucleus and right frontal lobe more anteriorly. SWI signal loss along the right sylvian fissure, likely reflecting a combination of petechial hemorrhage and the previously demonstrated small-volume acute subarachnoid hemorrhage at this site. Additional petechial hemorrhage within the right caudate nucleus infarct. Additional petechial hemorrhage versus chronic microhemorrhages within the right frontal operculum and right frontal lobe white matter. There is no significant mass effect at this time. No midline shift. Small acute cortically based infarct within the right occipital lobe (PCA vascular territory). Punctate acute infarct within the left occipital lobe (PCA vascular territory) (series 5, image 70). Background mild multifocal T2/FLAIR hyperintensity  within the cerebral white matter is nonspecific, but compatible with chronic small vessel ischemic disease. Redemonstrated small chronic infarct within the right cerebellar hemisphere. No evidence of intracranial mass. No extra-axial fluid collection. Vascular: Expected proximal arterial flow voids. Skull and upper cervical spine: No focal marrow lesion. Sinuses/Orbits: Visualized orbits show no acute finding. Trace scattered paranasal sinus mucosal thickening at the imaged levels. MRA HEAD FINDINGS: The intracranial internal carotid arteries are patent.  The M1 middle cerebral arteries are patent. High-grade focal stenosis with possible short-segment flow gap at site of previously demonstrated mid to distal right M2 MCA branch occlusion (series 10, images 125-134) (series 1081, image 3). The anterior cerebral arteries are patent. The intracranial vertebral arteries are patent. The basilar artery is patent. The posterior cerebral arteries are patent. A right posterior communicating artery is present. The left posterior communicating artery is hypoplastic or absent. No intracranial aneurysm is identified. IMPRESSION: MRI brain: 1. Acute right MCA vascular territory infarct as described. This has significantly increased in extent as compared to the brain MRI of 05/10/2020, and may have subtly increased in extent as compared to the head CT performed earlier today. No significant mass effect at this time 2. SWI signal loss along the right sylvian fissure which likely reflects a combination of petechial hemorrhage and the small-volume acute subarachnoid hemorrhage previously demonstrated at this site. 3. Small acute cortically-based infarct within the right occipital lobe (PCA vascular territory). 4. Punctate acute cortical infarct within the left occipital lobe (PCA vascular territory). 5. Background chronic findings without interval change, as described. MRA head: 1. High-grade focal stenosis with possible short-segment  flow gap at site of previously demonstrated mid-to-distal right M2 MCA branch occlusion. 2. Elsewhere, no intracranial large vessel occlusion or proximal high-grade arterial stenosis is identified. Electronically Signed   By: Kellie Simmering DO   On: 05/11/2020 18:10   MR BRAIN WO CONTRAST  Result Date: 05/11/2020 CLINICAL DATA:  Stroke, follow-up. EXAM: MRI HEAD WITHOUT CONTRAST MRA HEAD WITHOUT CONTRAST TECHNIQUE: Multiplanar, multiecho pulse sequences of the brain and surrounding structures were obtained without intravenous contrast. Angiographic images of the head were obtained using MRA technique without contrast. COMPARISON:  Head CT 05/11/2020. Images from thrombectomy 05/10/2020. Noncontrast head CT, CT angiogram head/neck and CT perfusion 05/10/2020. MRI/MRA head 05/10/2020. Small chronic infarct within the right cerebellar hemisphere. FINDINGS: MRI HEAD FINDINGS Brain: Mild-to-moderate cerebral atrophy. Comparatively mild cerebellar atrophy. Acute cortical/subcortical infarct within the right MCA vascular territory. The dominant component of the infarct is present within the right frontal operculum and right insula and now measures 4.7 x 3.9 x 4.2 cm. However, there are additional cortical/subcortical acute infarcts more superiorly and posteriorly within the right frontal lobe and right parietal lobe (with involvement of the pre and postcentral gyri). Additional small acute infarcts are also present within the right caudate nucleus and right frontal lobe more anteriorly. SWI signal loss along the right sylvian fissure, likely reflecting a combination of petechial hemorrhage and the previously demonstrated small-volume acute subarachnoid hemorrhage at this site. Additional petechial hemorrhage within the right caudate nucleus infarct. Additional petechial hemorrhage versus chronic microhemorrhages within the right frontal operculum and right frontal lobe white matter. There is no significant mass effect at  this time. No midline shift. Small acute cortically based infarct within the right occipital lobe (PCA vascular territory). Punctate acute infarct within the left occipital lobe (PCA vascular territory) (series 5, image 70). Background mild multifocal T2/FLAIR hyperintensity within the cerebral white matter is nonspecific, but compatible with chronic small vessel ischemic disease. Redemonstrated small chronic infarct within the right cerebellar hemisphere. No evidence of intracranial mass. No extra-axial fluid collection. Vascular: Expected proximal arterial flow voids. Skull and upper cervical spine: No focal marrow lesion. Sinuses/Orbits: Visualized orbits show no acute finding. Trace scattered paranasal sinus mucosal thickening at the imaged levels. MRA HEAD FINDINGS: The intracranial internal carotid arteries are patent. The M1 middle cerebral arteries are patent. High-grade focal stenosis  with possible short-segment flow gap at site of previously demonstrated mid to distal right M2 MCA branch occlusion (series 10, images 125-134) (series 1081, image 3). The anterior cerebral arteries are patent. The intracranial vertebral arteries are patent. The basilar artery is patent. The posterior cerebral arteries are patent. A right posterior communicating artery is present. The left posterior communicating artery is hypoplastic or absent. No intracranial aneurysm is identified. IMPRESSION: MRI brain: 1. Acute right MCA vascular territory infarct as described. This has significantly increased in extent as compared to the brain MRI of 05/10/2020, and may have subtly increased in extent as compared to the head CT performed earlier today. No significant mass effect at this time 2. SWI signal loss along the right sylvian fissure which likely reflects a combination of petechial hemorrhage and the small-volume acute subarachnoid hemorrhage previously demonstrated at this site. 3. Small acute cortically-based infarct within the  right occipital lobe (PCA vascular territory). 4. Punctate acute cortical infarct within the left occipital lobe (PCA vascular territory). 5. Background chronic findings without interval change, as described. MRA head: 1. High-grade focal stenosis with possible short-segment flow gap at site of previously demonstrated mid-to-distal right M2 MCA branch occlusion. 2. Elsewhere, no intracranial large vessel occlusion or proximal high-grade arterial stenosis is identified. Electronically Signed   By: Kellie Simmering DO   On: 05/11/2020 18:10   DG CHEST PORT 1 VIEW  Result Date: 05/12/2020 CLINICAL DATA:  Aspiration EXAM: PORTABLE CHEST 1 VIEW COMPARISON:  05/11/2020 FINDINGS: Nasogastric tube extends into the upper abdomen. Mild elevation of the right hemidiaphragm is unchanged with associated right basilar atelectasis. Lungs are otherwise clear. No pneumothorax or pleural effusion. Cardiac size is within normal limits. Apparent mediastinal widening is likely artifactual and related to semi-erect positioning. Pulmonary vascularity is normal. IMPRESSION: Stable mild elevation of the right hemidiaphragm with associated right basilar atelectasis. Electronically Signed   By: Fidela Salisbury MD   On: 05/12/2020 06:11   DG CHEST PORT 1 VIEW  Result Date: 05/11/2020 CLINICAL DATA:  Hypoxia EXAM: PORTABLE CHEST 1 VIEW COMPARISON:  05/10/2020 FINDINGS: Cardiac shadow is stable. Gastric catheter is noted in satisfactory position. Previously seen endotracheal tube is no longer identified. Bibasilar atelectatic changes are noted slightly increased when compared with the prior exam likely accentuated by poor inspiratory effort. No acute bony abnormality is seen. IMPRESSION: Increasing bibasilar atelectasis. Electronically Signed   By: Inez Catalina M.D.   On: 05/11/2020 12:48   DG Abd Portable 1V  Result Date: 05/11/2020 CLINICAL DATA:  Check gastric catheter placement EXAM: PORTABLE ABDOMEN - 1 VIEW COMPARISON:  None.  FINDINGS: Gastric catheter is noted within the stomach. No obstructive changes are seen. No free air is noted. IMPRESSION: Gastric catheter within the stomach. Electronically Signed   By: Inez Catalina M.D.   On: 05/11/2020 12:58   DG Swallowing Func-Speech Pathology  Result Date: 05/12/2020 Objective Swallowing Evaluation: Type of Study: MBS-Modified Barium Swallow Study  Patient Details Name: RAMA MCCLINTOCK MRN: [id number] Date of Birth: 12-31-1945 Today's Date: 05/12/2020 Time: SLP Start Time (ACUTE ONLY): 1403 -SLP Stop Time (ACUTE ONLY): 1424 SLP Time Calculation (min) (ACUTE ONLY): 21 min Past Medical History: Past Medical History: Diagnosis Date . Chronic kidney disease   kidney stones . Diabetes mellitus without complication ([place])  . Hyperlipidemia  . Hypertension  . Tinea pedis  Past Surgical History: Past Surgical History: Procedure Laterality Date . COLON SURGERY   . COLONOSCOPY WITH PROPOFOL N/A 09/18/2016  Procedure: COLONOSCOPY WITH PROPOFOL;  Surgeon: Lollie Sails, MD;  Location: [facility] ENDOSCOPY;  Service: Endoscopy;  Laterality: N/A; . EYE SURGERY    cataract extraction . IR CT HEAD LTD  05/10/2020 . IR PERCUTANEOUS ART THROMBECTOMY/INFUSION INTRACRANIAL INC DIAG ANGIO  05/10/2020 . RADIOLOGY WITH ANESTHESIA N/A 05/10/2020  Procedure: IR WITH ANESTHESIA;  Surgeon: Radiologist, Medication, MD;  Location: [place];  Service: Radiology;  Laterality: N/A; HPI: 75 year-old male presented to [facility] on 4/11 with new left-sided weakness and slurred speech. 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  On 4/11 he was emergently underwent revascularization of occluded superior division right MCA through mechanical thrombectomy.  ETT 4/11-/12.  No data recorded Assessment / Plan / Recommendation CHL IP CLINICAL IMPRESSIONS 05/12/2020 Clinical Impression Oropharyngeal dysphagia exhibited by  lingual pumping, decreased cohesion, delayed transit and mistimed sequlae of swallow. Delayed and effortful bolus propulsion with bolus on posterior base of tongue while pt attempting to retract tongue and initiate swallow. There was left anterior spill and stasis with thinner consistencies. Thin barium penetrated vestibule and was aspirated into before protective mechanisms were initiated followed by reflexive cough. A chin tuck did not eliminate or reduce aspiration which occured silently. Nectar thick boluses consistently reached his pyriform sinuses for 4-5 seconds prior to onset. Minimal valleculuar residue. Challenged with nectar with larger sips via straw filling pyriform sinuses which did not enter vestibule. Recommend pt initiate Dys 1 (pureee), nectar, crush pills, check left side oral cavity for pocketing and full assist for strategies. SLP Visit Diagnosis Dysphagia, oropharyngeal phase (R13.12) Attention and concentration deficit following -- Frontal lobe and executive function deficit following -- Impact on safety and function Moderate aspiration risk   [facility]L IP TREATMENT RECOMMENDATION 05/12/2020 Treatment Recommendations Therapy as outlined in treatment plan below   Prognosis 05/12/2020 Prognosis for Safe Diet Advancement Good Barriers to Reach Goals -- Barriers/Prognosis Comment -- [facility] IP DIET RECOMMENDATION 05/12/2020 SLP Diet Recommendations Dysphagia 1 (Puree) solids;Nectar thick liquid Liquid Administration via Cup;Straw Medication Administration Crushed with puree Compensations Minimize environmental distractions;Slow rate;Small sips/bites;Lingual sweep for clearance of pocketing Postural Changes Seated upright at 90 degrees   CHL IP OTHER RECOMMENDATIONS 05/12/2020 Recommended Consults -- Oral Care Recommendations Oral care BID Other Recommendations Order thickener from pharmacy   CHL IP FOLLOW UP RECOMMENDATIONS 05/12/2020 Follow up Recommendations Inpatient Rehab   [facility]L IP FREQUENCY AND DURATION  05/12/2020 Speech Therapy Frequency (ACUTE ONLY) min 2x/week Treatment Duration 2 weeks      [facility]L IP ORAL PHASE 05/12/2020 Oral Phase Impaired Oral - Pudding Teaspoon -- Oral - Pudding Cup -- Oral - Honey Teaspoon -- Oral - Honey Cup -- Oral - Nectar Teaspoon -- Oral - Nectar Cup Lingual pumping;Delayed oral transit;Weak lingual manipulation;Reduced posterior propulsion Oral - Nectar Straw -- Oral - Thin Teaspoon -- Oral - Thin Cup Left anterior bolus loss;Decreased bolus cohesion;Left pocketing in lateral sulci Oral - Thin Straw -- Oral - Puree WFL Oral - Mech Soft -- Oral - Regular Delayed oral transit;Weak lingual manipulation;Impaired mastication Oral - Multi-Consistency -- Oral - Pill -- Oral Phase - Comment --  [facility]L IP PHARYNGEAL PHASE 05/12/2020 Pharyngeal Phase Impaired Pharyngeal- Pudding Teaspoon -- Pharyngeal -- Pharyngeal- Pudding Cup -- Pharyngeal -- Pharyngeal- Honey Teaspoon -- Pharyngeal -- Pharyngeal- Honey Cup -- Pharyngeal -- Pharyngeal- Nectar Teaspoon -- Pharyngeal -- Pharyngeal- Nectar Cup Delayed swallow initiation-pyriform sinuses;Delayed swallow initiation-vallecula;Pharyngeal residue - valleculae Pharyngeal -- Pharyngeal- Nectar Straw --  Pharyngeal -- Pharyngeal- Thin Teaspoon -- Pharyngeal -- Pharyngeal- Thin Cup Penetration/Aspiration before swallow;Compensatory strategies attempted (with notebox) Pharyngeal Material enters airway, passes BELOW cords without attempt by patient to eject out (silent aspiration);Material enters airway, passes BELOW cords and not ejected out despite cough attempt by patient Pharyngeal- Thin Straw -- Pharyngeal -- Pharyngeal- Puree WFL Pharyngeal -- Pharyngeal- Mechanical Soft -- Pharyngeal -- Pharyngeal- Regular WFL Pharyngeal -- Pharyngeal- Multi-consistency -- Pharyngeal -- Pharyngeal- Pill -- Pharyngeal -- Pharyngeal Comment --  [facility]L IP CERVICAL ESOPHAGEAL PHASE 05/12/2020 Cervical Esophageal Phase WFL Pudding Teaspoon -- Pudding Cup -- Honey Teaspoon -- Honey  Cup -- Nectar Teaspoon -- Nectar Cup -- Nectar Straw -- Thin Teaspoon -- Thin Cup -- Thin Straw -- Puree -- Mechanical Soft -- Regular -- Multi-consistency -- Pill -- Cervical Esophageal Comment -- [place] Siren 05/12/2020, 4:48 PM  Orbie Pyo Litaker M.Ed Actor Pager [phone] Office [phone]             ECHOCARDIOGRAM COMPLETE  Result Date: 05/12/2020    ECHOCARDIOGRAM REPORT   Patient Name:   KAYSAN PEIXOTO Date of Exam: 05/12/2020 Medical Rec #:  [id number]       Height:       69.0 in Accession #:    [id number]      Weight:       220.2 lb Date of Birth:  [birth year]      BSA:          2.152 m Patient Age:    85 years        BP:           106/56 mmHg Patient Gender: M               HR:           74 bpm. Exam Location:  Inpatient Procedure: 2D Echo, Cardiac Doppler and Color Doppler Indications:    CVA  History:        Patient has no prior history of Echocardiogram examinations.                 Signs/Symptoms:CKD; Risk Factors:Hypertension, Dyslipidemia,                 Diabetes and Former Smoker.  Sonographer:    Dustin Flock Referring Phys: Dix  1. Left ventricular ejection fraction, by estimation, is 70 to 75%. The left ventricle has hyperdynamic function. The left ventricle has no regional wall motion abnormalities. There is mild concentric left ventricular hypertrophy. Left ventricular diastolic parameters were normal.  2. Right ventricular systolic function is normal. The right ventricular size is normal.  3. The mitral valve is normal in structure. Trivial mitral valve regurgitation.  4. The aortic valve is tricuspid. There is mild thickening of the aortic valve. Aortic valve regurgitation is not visualized. No aortic stenosis is present.  5. Aortic dilatation noted. There is mild dilatation of the aortic root, measuring 40 mm. Comparison(s): No prior Echocardiogram. Conclusion(s)/Recommendation(s): No intracardiac source of embolism  detected on this transthoracic study. A transesophageal echocardiogram is recommended to exclude cardiac source of embolism if clinically indicated. FINDINGS  Left Ventricle: Left ventricular ejection fraction, by estimation, is 70 to 75%. The left ventricle has hyperdynamic function. The left ventricle has no regional wall motion abnormalities. The left ventricular internal cavity size was normal in size. There is mild concentric left ventricular hypertrophy. Left ventricular diastolic parameters were normal. Right Ventricle: The right ventricular size is normal.  Right vetricular wall thickness was not well visualized. Right ventricular systolic function is normal. Left Atrium: Left atrial size was normal in size. Right Atrium: Right atrial size was normal in size. Pericardium: There is no evidence of pericardial effusion. Mitral Valve: The mitral valve is normal in structure. Trivial mitral valve regurgitation. Tricuspid Valve: The tricuspid valve is normal in structure. Tricuspid valve regurgitation is trivial. Aortic Valve: The aortic valve is tricuspid. There is mild thickening of the aortic valve. Aortic valve regurgitation is not visualized. No aortic stenosis is present. Pulmonic Valve: The pulmonic valve was normal in structure. Pulmonic valve regurgitation is trivial. Aorta: Aortic dilatation noted. There is mild dilatation of the aortic root, measuring 40 mm. IAS/Shunts: No atrial level shunt detected by color flow Doppler.  LEFT VENTRICLE PLAX 2D LVIDd:         4.25 cm  Diastology LVIDs:         2.00 cm  LV e' medial:    7.18 cm/s LV PW:         0.90 cm  LV E/e' medial:  10.3 LV IVS:        1.05 cm  LV e' lateral:   9.57 cm/s LVOT diam:     2.00 cm  LV E/e' lateral: 7.7 LV SV:         71 LV SV Index:   33 LVOT Area:     3.14 cm  RIGHT VENTRICLE RV Basal diam:  3.40 cm RV S prime:     17.90 cm/s TAPSE (M-mode): 3.3 cm LEFT ATRIUM             Index       RIGHT ATRIUM           Index LA diam:        3.50  cm 1.63 cm/m  RA Area:     16.00 cm LA Vol (A2C):   47.9 ml 22.25 ml/m RA Volume:   42.30 ml  19.65 ml/m LA Vol (A4C):   33.7 ml 15.66 ml/m LA Biplane Vol: 41.9 ml 19.47 ml/m  AORTIC VALVE LVOT Vmax:   127.00 cm/s LVOT Vmean:  82.500 cm/s LVOT VTI:    0.225 m  AORTA Ao Root diam: 3.50 cm MITRAL VALVE MV Area (PHT): 4.21 cm    SHUNTS MV Decel Time: 180 msec    Systemic VTI:  0.22 m MV E velocity: 74.10 cm/s  Systemic Diam: 2.00 cm MV A velocity: 75.00 cm/s MV E/A ratio:  0.99 Gwyndolyn Kaufman MD Electronically signed by Gwyndolyn Kaufman MD Signature Date/Time: 05/12/2020/12:59:11 PM    Final    VAS [place] LOWER EXTREMITY VENOUS (DVT)  Result Date: 05/12/2020  Lower Venous DVT Study Indications: Swelling and erythema LT>RT, s/p stroke.  Limitations: S/P RT cath. Comparison Study: No prior studies. Performing Technologist: Darlin Coco RDMS,RVT  Examination Guidelines: A complete evaluation includes B-mode imaging, spectral Doppler, color Doppler, and power Doppler as needed of all accessible portions of each vessel. Bilateral testing is considered an integral part of a complete examination. Limited examinations for reoccurring indications may be performed as noted. The reflux portion of the exam is performed with the patient in reverse Trendelenburg.  +---------+---------------+---------+-----------+----------+-------------------+ RIGHT    CompressibilityPhasicitySpontaneityPropertiesThrombus Aging      +---------+---------------+---------+-----------+----------+-------------------+ CFV      Full           Yes      Yes                                      +---------+---------------+---------+-----------+----------+-------------------+  SFJ                                                   Limited visibility                                                        due to recent cath  +---------+---------------+---------+-----------+----------+-------------------+ FV Prox  Full                                                              +---------+---------------+---------+-----------+----------+-------------------+ FV Mid   Full                                                             +---------+---------------+---------+-----------+----------+-------------------+ FV DistalFull                                                             +---------+---------------+---------+-----------+----------+-------------------+ PFV      Full                                                             +---------+---------------+---------+-----------+----------+-------------------+ POP      Full           Yes      Yes                                      +---------+---------------+---------+-----------+----------+-------------------+ PTV      Full                                                             +---------+---------------+---------+-----------+----------+-------------------+ PERO     Full                                                             +---------+---------------+---------+-----------+----------+-------------------+   +---------+---------------+---------+-----------+----------+--------------+ LEFT     CompressibilityPhasicitySpontaneityPropertiesThrombus Aging +---------+---------------+---------+-----------+----------+--------------+ CFV      Full  Yes      Yes                                 +---------+---------------+---------+-----------+----------+--------------+ SFJ      Full                                                        +---------+---------------+---------+-----------+----------+--------------+ FV Prox  Full                                                        +---------+---------------+---------+-----------+----------+--------------+ FV Mid   Full                                                        +---------+---------------+---------+-----------+----------+--------------+  FV DistalFull                                                        +---------+---------------+---------+-----------+----------+--------------+ PFV      Full                                                        +---------+---------------+---------+-----------+----------+--------------+ POP      Full           Yes      Yes                                 +---------+---------------+---------+-----------+----------+--------------+ PTV      Full                                                        +---------+---------------+---------+-----------+----------+--------------+ PERO     Full                                                        +---------+---------------+---------+-----------+----------+--------------+     Summary: RIGHT: - There is no evidence of deep vein thrombosis in the lower extremity.  - No cystic structure found in the popliteal fossa.  LEFT: - There is no evidence of deep vein thrombosis in the lower extremity.  - No cystic structure found in the popliteal fossa.  *See table(s) above for measurements and observations. Electronically signed by Monica Martinez MD on 05/12/2020 at 2:16:36  PM.    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 Does the need for close, 24 hr/day medical supervision in concert with the patient's rehab needs make it unreasonable for this patient to be served in a less intensive setting? Yes 2. Co-Morbidities requiring supervision/potential complications: CKD, DM, HTN 3. Due to bladder management, bowel management, safety, skin/wound care, disease management, medication administration, pain management and patient education, does the patient require 24 hr/day rehab nursing? Yes 4. Does the patient require coordinated care of a physician, rehab nurse, therapy disciplines of PT, OT, SLP to address physical and functional deficits in the context  of the above medical diagnosis(es)? Yes Addressing deficits in the following areas: balance, endurance, locomotion, strength, transferring, bowel/bladder control, bathing, dressing, feeding, grooming, toileting, cognition, speech and psychosocial support 5. Can the patient actively participate in an intensive therapy program of at least 3 hrs of therapy per day at least 5 days per week? Yes 6. The potential for patient to make measurable gains while on inpatient rehab is excellent 7. Anticipated functional outcomes upon discharge from inpatient rehab are supervision  with PT, supervision and min assist with OT, modified independent and supervision with SLP. 8. Estimated rehab length of stay to reach the above functional goals is: 14-20 days 9. Anticipated discharge destination: Home 10. Overall Rehab/Functional Prognosis: excellent  RECOMMENDATIONS: This patient's condition is appropriate for continued rehabilitative care in the following setting: CIR Patient has agreed to participate in recommended program. Yes Note that insurance prior authorization may be required for reimbursement for recommended care.  Comment: Rehab Admissions Coordinator to follow up.  Thanks,  Meredith Staggers, MD, Mellody Drown  I have personally performed a face to face diagnostic evaluation of this patient. Additionally, I have examined pertinent labs and radiographic images. I have reviewed and concur with the physician assistant's documentation above.    Lavon Paganini Angiulli, PA-C 05/13/2020

## 2020-05-13 NOTE — Progress Notes (Signed)
Physical Therapy Treatment Patient Details Name: Thomas Sampson MRN: [id number] DOB: Jun 19, 1945 Today's Date: 05/13/2020    History of Present Illness 75 year-old male presented to [facility] on 4/11 with new left-sided weakness and slurred speech. 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  On 4/11 he was emergently underwent revascularization of occluded superior division right MCA through mechanical thrombectomy.  ETT 4/11-/12. Significant PMH: CKD stage IV, DM2.    PT Comments    The pt made great progress with therapy session today. He was able to demo improvements in bed mobility and transfers (progression from [place] of 2 last session to minA of 1 for sit-stand transfers), and was able to initiate steps with minA of 2 and cues for improved awareness and placement of LLE. The pt remains highly motivated and will benefit from CIR level therapies to address deficits in strength, power, dynamic stability, and walking endurance prior to eventual return home.    Follow Up Recommendations  CIR     Equipment Recommendations  3in1 (PT);Wheelchair (measurements PT);Wheelchair cushion (measurements PT)    Recommendations for Other Services       Precautions / Restrictions Precautions Precautions: Fall;Other (comment) Precaution Comments: L inattention Restrictions Weight Bearing Restrictions: No    Mobility  Bed Mobility Overal bed mobility: Needs Assistance Bed Mobility: Supine to Sit     Supine to sit: Min assist;HOB elevated     General bed mobility comments: cues for moving BLE to EOB, needing minA to complete movement. minA to elevate trunk from elevated HOB. pt then able to steady in sitting EOB without assist    Transfers Overall transfer level: Needs assistance Equipment used: 1 person hand held assist;2 person hand held assist Transfers: Sit to/from  [facility] Sit to Stand: Min assist Stand pivot transfers: Min assist;+2 physical assistance       General transfer comment: minA of 1 to power up from chair, pt with no noted buckling in LLE, able to maintain wt shift in midline. minA of 2 to take lateral steps and pivot to recliner. RUE support with stance on LLE  Ambulation/Gait Ambulation/Gait assistance: Min assist;+2 physical assistance Gait Distance (Feet): 5 Feet Assistive device: 2 person hand held assist Gait Pattern/deviations: Step-to pattern;Decreased stride length Gait velocity: decreased Gait velocity interpretation: <1.31 ft/sec, indicative of household ambulator General Gait Details: small lateral steps to R, increased RUE support with stance on LLE to advance RLE. small steps with increased difficulty coordinating placement of LLE      Modified Rankin (Stroke Patients Only) Modified Rankin (Stroke Patients Only) Pre-Morbid Rankin Score: No symptoms Modified Rankin: Moderately severe disability     Balance Overall balance assessment: Needs assistance Sitting-balance support: Feet supported Sitting balance-Leahy Scale: Fair Sitting balance - Comments: Able to sit unsupported with supervision, left lateral lean with challenge Postural control: Left lateral lean Standing balance support: Single extremity supported Standing balance-Leahy Scale: Poor Standing balance comment: left lateral lean, minA of 2 for dynamic stance, minA of 1 for static stance                            Cognition Arousal/Alertness: Awake/alert Behavior During Therapy: WFL for tasks assessed/performed Overall Cognitive Status: Impaired/Different from baseline Area of Impairment: Following commands;Safety/judgement;Awareness;Problem solving  Following Commands: Follows one step commands consistently;Follows multi-step commands inconsistently Safety/Judgement: Decreased awareness  of safety;Decreased awareness of deficits Awareness: Emergent Problem Solving: Requires verbal cues General Comments: pt alert and oriented, able to voice need for thickened liquid and other safety measures, but needing cues to attend to L side when scanning room. making corrections well to cues, highly motivated. emotionally labile through session in regards to function lost, but easily redirectable      Exercises General Exercises - Lower Extremity Long Arc Quad: AROM;Both;10 reps;Seated Heel Slides: AROM;Both;10 reps;Seated Hip Flexion/Marching: AROM;Both;10 reps;Seated (tactile cues to limit LLE ER with hip flexion) Toe Raises: AROM;Both;15 reps;Seated Heel Raises: AROM;Both;15 reps;Seated Other Exercises Other Exercises: sit-stand from recliner x5 with minA of 1 and cues to power back up to standing after light touch of hips to recliner    General Comments General comments (skin integrity, edema, etc.): VSS, wife present and supportive.      Pertinent Vitals/Pain Pain Assessment: No/denies pain           PT Goals (current goals can now be found in the care plan section) Acute Rehab PT Goals Patient Stated Goal: to be able to take care of self PT Goal Formulation: With patient/family Time For Goal Achievement: 05/26/20 Potential to Achieve Goals: Good Progress towards PT goals: Progressing toward goals    Frequency    Min 4X/week      PT Plan Current plan remains appropriate       AM-PAC PT "6 Clicks" Mobility   Outcome Measure  Help needed turning from your back to your side while in a flat bed without using bedrails?: A Lot Help needed moving from lying on your back to sitting on the side of a flat bed without using bedrails?: A Lot Help needed moving to and from a bed to a chair (including a wheelchair)?: A Lot Help needed standing up from a chair using your arms (e.g., wheelchair or bedside chair)?: A Little Help needed to walk in hospital room?: A  Lot Help needed climbing 3-5 steps with a railing? : A Lot 6 Click Score: 13    End of Session Equipment Utilized During Treatment: Gait belt Activity Tolerance: Patient tolerated treatment well Patient left: in chair;with call bell/phone within reach;with chair alarm set;with family/visitor present Nurse Communication: Mobility status PT Visit Diagnosis: Unsteadiness on feet (R26.81);Difficulty in walking, not elsewhere classified (R26.2);Other symptoms and signs involving the nervous system (R29.898);Hemiplegia and hemiparesis Hemiplegia - Right/Left: Left Hemiplegia - dominant/non-dominant: Non-dominant Hemiplegia - caused by: Cerebral infarction     Time: 1405-1450 PT Time Calculation (min) (ACUTE ONLY): 45 min  Charges:  $Gait Training: 8-22 mins $Therapeutic Exercise: 8-22 mins $Neuromuscular Re-education: 8-22 mins                     Karma Ganja, PT, DPT   Acute Rehabilitation Department Pager #: [phone]   Otho Bellows 05/13/2020, 4:02 PM

## 2020-05-13 NOTE — Progress Notes (Addendum)
STROKE TEAM PROGRESS NOTE   INTERVAL HISTORY His wife and brother come to the bedside towards the end of exam.  Patient was transferred out of the ICU this AM. Per RN patient is a bit upset because he is worried that he is not improving, but patient is happy to report that he was able to eat breakfast that fit his recommended diet, per SLP. Patient reports that he is a bit worried about the lack of strength in his LUE. Wife later reveals that the patient is L handed although he has sufficient coordination for task not related to writing with his R hand. Patient suddenly became tearful during the exam and indicated that he was not sad, but he could not control his emotions. Patient's family noted that the patient has had a difficult time controlling his emotions and will have sudden episodes of tearfulness although patient denies feeling sad. These episodes began after the patient's stroke.   Vitals:   05/13/20 0818 05/13/20 0900 05/13/20 1000 05/13/20 1141  BP:  131/62 (!) 150/71 140/65  Pulse:  65 87 70  Resp:  (!) 23 15 20   Temp:    98.2 F (36.8 C)  TempSrc:    Oral  SpO2: 95% 93% 93% 95%  Height:       CBC:  Recent Labs  Lab 05/10/20 0940 05/10/20 1946 05/11/20 0405 05/12/20 0444 05/13/20 0229  WBC 9.0  --  17.2* 15.2* 11.9*  NEUTROABS 6.5  --  15.3*  --   --   HGB 14.6   < > 12.7* 11.6* 11.6*  HCT 42.6   < > 38.8* 35.6* 36.0*  MCV 92.2  --  95.1 96.2 99.4  PLT 270  --  269 241 234   < > = values in this interval not displayed.   Basic Metabolic Panel:  Recent Labs  Lab 05/12/20 0444 05/13/20 0229  NA 145 143  K 3.5 3.8  CL 111 109  CO2 26 27  GLUCOSE 213* 223*  BUN 27* 24*  CREATININE 2.05* 2.02*  CALCIUM 8.3* 8.7*  MG 1.5*  --    Lipid Panel:  Recent Labs  Lab 05/11/20 0405  CHOL 166  TRIG 208*  204*  HDL 33*  CHOLHDL 5.0  VLDL 42*  LDLCALC 91   HgbA1c:  Recent Labs  Lab 05/11/20 0405  HGBA1C 8.2*   Urine Drug Screen: No results for input(s):  LABOPIA, COCAINSCRNUR, LABBENZ, AMPHETMU, THCU, LABBARB in the last 168 hours.  Alcohol Level No results for input(s): ETH in the last 168 hours.  IMAGING past 24 hours DG Swallowing Func-Speech Pathology  Result Date: 05/12/2020 Objective Swallowing Evaluation: Type of Study: MBS-Modified Barium Swallow Study  Patient Details Name: JAIMON BUGAJ MRN: [id number] Date of Birth: 1945-11-27 Today's Date: 05/12/2020 Time: SLP Start Time (ACUTE ONLY): 1403 -SLP Stop Time (ACUTE ONLY): 1424 SLP Time Calculation (min) (ACUTE ONLY): 21 min Past Medical History: Past Medical History: Diagnosis Date . Chronic kidney disease   kidney stones . Diabetes mellitus without complication ([place])  . Hyperlipidemia  . Hypertension  . Tinea pedis  Past Surgical History: Past Surgical History: Procedure Laterality Date . COLON SURGERY   . COLONOSCOPY WITH PROPOFOL N/A 09/18/2016  Procedure: COLONOSCOPY WITH PROPOFOL;  Surgeon: Lollie Sails, MD;  Location: [facility] ENDOSCOPY;  Service: Endoscopy;  Laterality: N/A; . EYE SURGERY    cataract extraction . IR CT HEAD LTD  05/10/2020 . IR PERCUTANEOUS ART THROMBECTOMY/INFUSION INTRACRANIAL INC DIAG ANGIO  05/10/2020 .  RADIOLOGY WITH ANESTHESIA N/A 05/10/2020  Procedure: IR WITH ANESTHESIA;  Surgeon: Radiologist, Medication, MD;  Location: [place];  Service: Radiology;  Laterality: N/A; HPI: 75 year-old male presented to [facility] on 4/11 with new left-sided weakness and slurred speech. 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  On 4/11 he was emergently underwent revascularization of occluded superior division right MCA through mechanical thrombectomy.  ETT 4/11-/12.  No data recorded Assessment / Plan / Recommendation CHL IP CLINICAL IMPRESSIONS 05/12/2020 Clinical Impression Oropharyngeal dysphagia exhibited by lingual pumping, decreased cohesion, delayed transit and  mistimed sequlae of swallow. Delayed and effortful bolus propulsion with bolus on posterior base of tongue while pt attempting to retract tongue and initiate swallow. There was left anterior spill and stasis with thinner consistencies. Thin barium penetrated vestibule and was aspirated into before protective mechanisms were initiated followed by reflexive cough. A chin tuck did not eliminate or reduce aspiration which occured silently. Nectar thick boluses consistently reached his pyriform sinuses for 4-5 seconds prior to onset. Minimal valleculuar residue. Challenged with nectar with larger sips via straw filling pyriform sinuses which did not enter vestibule. Recommend pt initiate Dys 1 (pureee), nectar, crush pills, check left side oral cavity for pocketing and full assist for strategies. SLP Visit Diagnosis Dysphagia, oropharyngeal phase (R13.12) Attention and concentration deficit following -- Frontal lobe and executive function deficit following -- Impact on safety and function Moderate aspiration risk   CHL IP TREATMENT RECOMMENDATION 05/12/2020 Treatment Recommendations Therapy as outlined in treatment plan below   Prognosis 05/12/2020 Prognosis for Safe Diet Advancement Good Barriers to Reach Goals -- Barriers/Prognosis Comment -- CHL IP DIET RECOMMENDATION 05/12/2020 SLP Diet Recommendations Dysphagia 1 (Puree) solids;Nectar thick liquid Liquid Administration via Cup;Straw Medication Administration Crushed with puree Compensations Minimize environmental distractions;Slow rate;Small sips/bites;Lingual sweep for clearance of pocketing Postural Changes Seated upright at 90 degrees   CHL IP OTHER RECOMMENDATIONS 05/12/2020 Recommended Consults -- Oral Care Recommendations Oral care BID Other Recommendations Order thickener from pharmacy   CHL IP FOLLOW UP RECOMMENDATIONS 05/12/2020 Follow up Recommendations Inpatient Rehab   CHL IP FREQUENCY AND DURATION 05/12/2020 Speech Therapy Frequency (ACUTE ONLY) min 2x/week  Treatment Duration 2 weeks      CHL IP ORAL PHASE 05/12/2020 Oral Phase Impaired Oral - Pudding Teaspoon -- Oral - Pudding Cup -- Oral - Honey Teaspoon -- Oral - Honey Cup -- Oral - Nectar Teaspoon -- Oral - Nectar Cup Lingual pumping;Delayed oral transit;Weak lingual manipulation;Reduced posterior propulsion Oral - Nectar Straw -- Oral - Thin Teaspoon -- Oral - Thin Cup Left anterior bolus loss;Decreased bolus cohesion;Left pocketing in lateral sulci Oral - Thin Straw -- Oral - Puree WFL Oral - Mech Soft -- Oral - Regular Delayed oral transit;Weak lingual manipulation;Impaired mastication Oral - Multi-Consistency -- Oral - Pill -- Oral Phase - Comment --  CHL IP PHARYNGEAL PHASE 05/12/2020 Pharyngeal Phase Impaired Pharyngeal- Pudding Teaspoon -- Pharyngeal -- Pharyngeal- Pudding Cup -- Pharyngeal -- Pharyngeal- Honey Teaspoon -- Pharyngeal -- Pharyngeal- Honey Cup -- Pharyngeal -- Pharyngeal- Nectar Teaspoon -- Pharyngeal -- Pharyngeal- Nectar Cup Delayed swallow initiation-pyriform sinuses;Delayed swallow initiation-vallecula;Pharyngeal residue - valleculae Pharyngeal -- Pharyngeal- Nectar Straw -- Pharyngeal -- Pharyngeal- Thin Teaspoon -- Pharyngeal -- Pharyngeal- Thin Cup Penetration/Aspiration before swallow;Compensatory strategies attempted (with notebox) Pharyngeal Material enters airway, passes BELOW cords without attempt by patient to eject out (silent aspiration);Material enters airway, passes BELOW cords and not ejected  out despite cough attempt by patient Pharyngeal- Thin Straw -- Pharyngeal -- Pharyngeal- Puree WFL Pharyngeal -- Pharyngeal- Mechanical Soft -- Pharyngeal -- Pharyngeal- Regular WFL Pharyngeal -- Pharyngeal- Multi-consistency -- Pharyngeal -- Pharyngeal- Pill -- Pharyngeal -- Pharyngeal Comment --  CHL IP CERVICAL ESOPHAGEAL PHASE 05/12/2020 Cervical Esophageal Phase WFL Pudding Teaspoon -- Pudding Cup -- Honey Teaspoon -- Honey Cup -- Nectar Teaspoon -- Nectar Cup -- Nectar Straw -- Thin  Teaspoon -- Thin Cup -- Thin Straw -- Puree -- Mechanical Soft -- Regular -- Multi-consistency -- Pill -- Cervical Esophageal Comment -- [place] Siren 05/12/2020, 4:48 PM  Orbie Pyo Litaker M.Ed Risk analyst [phone] Office [phone]              PHYSICAL EXAM  Gen: NAD, elderly white male, resting in bed HEENT: EOM intact today, crosses midline CV: RRR Resp: Symmetrical chest rise and fall, no acute distress Ext: LLE is mildly swollen from the knee down, SCD has been removed.  Skin: No brusing or lesions noted.   Neurological Exam :  Awake oriented to person, place, time, and situation. Patient talking about his breakfast and making jokes. Patient speech is mildly dysarthric, but understandable.No gaze preference today, EMOI. He follows all commands well.78mm pupils.. Patient completely turns his head to look at items to the left only slightly left of midline. He has moderate left lower facial weakness, with facial droop.Tongue midline. LLE 3/5 with less drift today. RLE 5/5 can resist pressure.  RUE 5/5. LUE did not really move proximal muslces much today 1/5, can see contraction of muscles on purposeful attempt to move.Light Touch intact to all extremities today. He does not have left hemibody neglect.  Some dysmetria in the R, unable to test L FTN 2/2 weakness.Extinction is intact today. Gait not tested Pt. becomes tearful randomly and reports he cannot control his emotions.   ASSESSMENT/PLAN Lacharles Altschuler Zacharyis a 74 y.o.malewith medical history significant ofhypertension, hyperlipidemia, diabetes mellitus, GERD, CKD-4, kidney stone, claudication, who presentedto [place] left-sided weakness, slurred speechupon arising from sleep at 7:30 AM, 05/10/2020. Patient symptoms initial improved upon [facility]; MRI concluded that patient had aacute right insular and parietal operculum infarct and MRA showed  occlusion of the right M3 superior division branch which was quite large. Patient NIHHSS worsened to a 10 and CT Perfusion suggested patient was a candidate for IR thrombectomy, patient was transferred to [facility] for procedure. Patient underwent repeat MRI and MRA and was noted to have continued stenosis of the previously occluded R M2 MCA. MRI noted that patient's increase in infarct size from 4/11 as well as a small acute cortically- based infarct within the R occipital lobe and a punctate cortical infarct in the L occipital lobe. Etiology of strokes is intracranial stenosis. Patient care has been downgraded and patient is no longer in the ICU. Patient speech continues to improve. Patient reports he will be attempting to practice his therapy to work on his strength on his own. Patient also appears to have psuedobubar affect. Discussed with patient and family what this means and that patient's ability to control his tearfulness may improve with time, but if not the patient may be started on specific medications OP. Patient appears to be making gradual improvements.   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 Normal appearing brain parenchyma.  No acute infarct demonstrated by CT. No mass or hemorrhage.  Slight arterial vascular calcification noted. Areas of paranasal sinus mucosal thickening noted. - CTA: completed -CT perfusion:Abnormal CT perfusion. 29 mm core infarct with additional 33 mL of surrounding penumbra in the right insula and operculum corresponding to restricted diffusion on MRI. Occluded right superior branch of M3 which is a relatively large vessel compatible with an embolus.   NWG:NFAOZ infarct right insula and parietal operculum.  HYQ:MVHQIONGE of the right M3 branch superior  division  Repeat MRA: 1. High-grade focal stenosis with possible short-segment flow gap at site of previously demonstrated mid-to-distal right M2 MCA branch occlusion. 2. Elsewhere, no intracranial large vessel occlusion or proximal high-grade arterial stenosis is identified.   Repeat MRI: 1. Acute right MCA vascular territory infarct as described. This has significantly increased in extent as compared to the brain MRI of 05/10/2020, and may have subtly increased in extent as compared to the head CT performed earlier today. No significant mass effect at this time 2. SWI signal loss along the right sylvian fissure which likely reflects a combination of petechial hemorrhage and the small-volume acute subarachnoid hemorrhage previously demonstrated at this site. 3. Small acute cortically-based infarct within the right occipital lobe (PCA vascular territory). 4. Punctate acute cortical infarct within the left occipital lobe (PCA vascular territory). 5. Background chronic findings without interval change, as Described.   2D Echo:  Normal LVEF no LV regional wall abnormalities, Trivial MVR, Mild dilatation of the aortic root.   venous US DVT of BLE: No evidence of DVT  Consider loop recorder to rule out afib  LDL91  HgbA1c8.2  VTE prophylaxis -SCDs  SLP continue to evaluate and possibly advance diet       Diet   Diet Dysphagia 1          aspirin 81 mg dailyprior to admission, now on ASA 324 + Plavix for 3 months. Continue Plavix alone after 3 mon.   Therapy recommendations:CIR  Disposition:Pending  Hypertension  Home meds:Amlodipine 2.5mg  daily, lisinopril 40mg , HCTZ 12.5 daily, metoprolol 100mg   BP goal remains 120-14mmgHg sys  On Celviprex  Hyperlipidemia  Home meds - none  LDL 91, goal < 70  continue Atorvastatin 40mg  daily  Continue statin at discharge  Diabetes type II   Home meds:Glipizide 10mg , metformin 1000mg  BID,  Novolog 26 U ac breakfast an 22 U ac supper  HgbA1c 8.2, goal < 7.0  CBGs  SSI  Now on lantus 35 U with 8 U QAC  DM coordinator following  Other Stroke Risk Factors  Advanced Age >/= 30    Obesity,Body mass index is 32.52 kg/m., BMI >/= 30 associated with increased stroke risk, recommend weight loss, diet and exercise as appropriate   Acute onset LLE edema - venous US DVT of BLE: No evidence of DVT    Hospital day # 3  Damita Dunnings, MD PGY-1   ATTENDING NOTE: I reviewed above note and agree with the assessment and plan. Pt was seen and examined.   75 year old male with history of hypertension, hyperlipidemia, diabetes, CKD 4, PAD admitted for left-sided weakness and slurred speech.  MRI showed right insular cortex infarct and MRA showed right M3 occlusion.  CTA confirmed proximal right MCA occlusion with positive CTP.  Set post thrombectomy with TICI2c.  Post procedure, CT showed right supra and Future small SAH and MRI showed increased right MCA infarct size to moderate sized postprocedure, MRA again showed distal right M3 occlusion.  LDL 91,  A1c 8.2.  DVT negative, EF 70 to 75%.  Patient was extubated 05/11/2020.  Creatinine improving from 2.26 down to 2.05.  Leukocytosis also improving from 17.2 down to 15.2.    On exam, patient wife and brother at bedside, patient awake, alert, eyes open, orientated to age, place, time. No aphasia, but paucity of speech with mild dysarthria, following all simple commands. Pseudobulbar affect present with uncontrollable crying intermittently. Able to name and repeat. Right gaze preference but able to cross midline with left gaze incomplete, tracking bilaterally, visual field full, PERRL. Left facial droop. Tongue protrusion to the left. Right UE and LE at least 4/5, no drift. Left UE plegic, left LE 3/5 with drift but not touch bed within 5 sec. However, distal ankle PF/DF 5-/5. Sensation symmetrical bilaterally, right FTN intact, gait not  tested.   Etiology for patient's stroke not quite clear, but concerning for cardioembolic source with right M3 thrombus.  Recommend loop recorder before discharge to rule out A. Fib.  Patient dysphagia currently on dysphagia 1 and nectar thick liquid.  Continue aspirin 325 and Plavix 75 for 3 months and then Plavix alone given large vessel occlusion.  Continue hydration to improve renal function.  BMP monitoring.  Continue statin.  CBG monitoring, DM coordinator on board, currently on Lantus 35 units and NovoLog 8 units before every meal.  PT/OT recommended CIR.  For detailed assessment and plan, please refer to above as I have made changes wherever appropriate.   Rosalin Hawking, MD PhD Stroke Neurology 05/13/2020 7:31 PM  This patient is critically ill due to right MCA stroke status post thrombectomy, postprocedure SAH, AKI on CKD, leukocytosis hyperglycemia and at significant risk of neurological worsening, death form stroke extension, hemorrhagic conversion, renal failure, aspiration pneumonia, sepsis, DKA, seizure. This patient's care requires constant monitoring of vital signs, hemodynamics, respiratory and cardiac monitoring, review of multiple databases, neurological assessment, discussion with family, other specialists and medical decision making of high complexity. I spent 40 minutes of neurocritical care time in the care of this patient. I had long discussion with wife and brother at bedside, updated pt current condition, treatment plan and potential prognosis, and answered all the questions.  They expressed understanding and appreciation.    To contact Stroke Continuity provider, please refer to [email or web address] After hours, contact General Neurology

## 2020-05-13 NOTE — Evaluation (Signed)
Speech Language Pathology Evaluation Patient Details Name: Thomas Sampson MRN: [id number] DOB: [birth year] Today's Date: 05/13/2020 Time: 8546-2703 SLP Time Calculation (min) (ACUTE ONLY): 17 min  Problem List:  Patient Active Problem List   Diagnosis Date Noted  . Stroke ([place]) 05/10/2020  . CKD (chronic kidney disease), stage IV ([place]) 05/10/2020  . Fall 05/10/2020  . GERD (gastroesophageal reflux disease) 05/10/2020  . Middle cerebral artery embolism, right 05/10/2020  . Stroke (cerebrum) ([place])   . Chronic respiratory failure with hypoxia ([place])   . Atelectasis   . Hyperlipidemia 10/15/2018  . Type II diabetes mellitus with renal manifestations ([place]) 09/21/2017  . Hypertension 09/21/2017  . CKD (chronic kidney disease) stage 3, GFR 30-59 ml/min ([facility]) 09/21/2017  . Claudication ([place]) 09/21/2017  . Left flank pain 11/08/2016  . Porokeratosis 07/23/2015  . Bursitis of hip, right 01/15/2015   Past Medical History:  Past Medical History:  Diagnosis Date  . Chronic kidney disease    kidney stones  . Diabetes mellitus without complication ([place])   . Hyperlipidemia   . Hypertension   . Tinea pedis    Past Surgical History:  Past Surgical History:  Procedure Laterality Date  . COLON SURGERY    . COLONOSCOPY WITH PROPOFOL N/A 09/18/2016   Procedure: COLONOSCOPY WITH PROPOFOL;  Surgeon: Lollie Sails, MD;  Location: [facility] ENDOSCOPY;  Service: Endoscopy;  Laterality: N/A;  . EYE SURGERY     cataract extraction  . IR CT HEAD LTD  05/10/2020  . IR PERCUTANEOUS ART THROMBECTOMY/INFUSION INTRACRANIAL INC DIAG ANGIO  05/10/2020  . RADIOLOGY WITH ANESTHESIA N/A 05/10/2020   Procedure: IR WITH ANESTHESIA;  Surgeon: Radiologist, Medication, MD;  Location: [facility];  Service: Radiology;  Laterality: N/A;   HPI:  75 year-old male presented to [facility] on 4/11 with new left-sided weakness and slurred speech. MRI/MRA imaging showed right insular and parietal operculum  infarct and MRA showed occlusion of the right M4 branch which was quite large.  On 4/11 he was emergently underwent revascularization of occluded superior division right MCA through mechanical thrombectomy.  ETT 4/11-/12.   Assessment / Plan / Recommendation Clinical Impression  Pt's speech is min-mildly dysarthric with phonemic impresicion but intelligible in conversation. He neglects his left side of his environment however able to see left side of illustrated objects. Receptive and expressive language is intact. On the SLUMS he scored a 24/30 with challenges in delayed recall, mental calculation, attention. Pt would benefit from continued ST on acute venue and CIR.    SLP Assessment  SLP Recommendation/Assessment: Patient needs continued Speech Lanaguage Pathology Services SLP Visit Diagnosis: Dysarthria and anarthria (R47.1);Cognitive communication deficit (R41.841)    Follow Up Recommendations  Inpatient Rehab    Frequency and Duration min 2x/week  2 weeks      SLP Evaluation Cognition  Overall Cognitive Status: Impaired/Different from baseline Arousal/Alertness: Awake/alert Orientation Level: Oriented X4 Attention: Sustained Sustained Attention: Appears intact Memory: Impaired Memory Impairment: Retrieval deficit Awareness: Impaired Awareness Impairment: Anticipatory impairment Problem Solving: Impaired Problem Solving Impairment: Verbal basic Behaviors: Lability Safety/Judgment: Impaired       Comprehension  Auditory Comprehension Overall Auditory Comprehension: Appears within functional limits for tasks assessed Visual Recognition/Discrimination Discrimination: Not tested Reading Comprehension Reading Status: Not tested    Expression Expression Primary Mode of Expression: Verbal Verbal Expression Overall Verbal Expression: Appears within functional limits for tasks assessed Naming: No impairment Pragmatics: No impairment Written Expression Dominant Hand:  Left Written Expression: Not tested   Oral /  Motor  Oral Motor/Sensory Function Overall Oral Motor/Sensory Function: Moderate impairment Facial ROM: Reduced left;Suspected CN VII (facial) dysfunction Facial Symmetry: Abnormal symmetry left;Suspected CN VII (facial) dysfunction Facial Strength: Reduced left;Suspected CN VII (facial) dysfunction Facial Sensation: Other (Comment) Lingual ROM: Reduced left;Suspected CN XII (hypoglossal) dysfunction Lingual Symmetry: Abnormal symmetry left;Suspected CN XII (hypoglossal) dysfunction Lingual Strength: Reduced;Suspected CN XII (hypoglossal) dysfunction Mandible: Within Functional Limits Motor Speech Overall Motor Speech: Impaired Respiration: Within functional limits Phonation: Normal Resonance: Within functional limits Articulation: Impaired Level of Impairment: Sentence Intelligibility: Intelligible Motor Planning: Witnin functional limits   GO                    Thomas Sampson 05/13/2020, 4:56 PM  Thomas Sampson Thomas Sampson M.Ed Risk analyst [phone] Office [phone]

## 2020-05-13 NOTE — Progress Notes (Signed)
  Speech Language Pathology Treatment: Dysphagia  Patient Details Name: Thomas Sampson MRN: [id number] DOB: 1945-06-23 Today's Date: 05/13/2020 Time: 0174-9449 SLP Time Calculation (min) (ACUTE ONLY): 17 min  Assessment / Plan / Recommendation Clinical Impression  Skilled dysphagia intervention including observation, compensatory introduction and implementation and education. Results of MBS relayed to wife. Pt was able to recall general outcome of yesterday's MBS and need for thickener. Managed cup sips, needing min tactile assist to steady straw with nectar consistency and cues for smaller volumes. He did not cough, throat clear or exhibit wet quality. Significant left pocketing with upgraded trial peaches that he was  asensate to with incomplete clearance. Recommend continue Dys 1, nectar with full supervision, crush pills and continued ST.    HPI HPI: 75 year-old male presented to [facility] on 4/11 with new left-sided weakness and slurred speech. 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  On 4/11 he was emergently underwent revascularization of occluded superior division right MCA through mechanical thrombectomy.  ETT 4/11-/12.      SLP Plan  Continue with current plan of care       Recommendations  Diet recommendations: Dysphagia 1 (puree);Nectar-thick liquid Liquids provided via: Cup;Straw Medication Administration: Crushed with puree Compensations: Minimize environmental distractions;Slow rate;Small sips/bites;Lingual sweep for clearance of pocketing Postural Changes and/or Swallow Maneuvers: Seated upright 90 degrees                Oral Care Recommendations: Oral care BID Follow up Recommendations: Inpatient Rehab SLP Visit Diagnosis: Dysphagia, oropharyngeal phase (R13.12) Plan: Continue with current plan of care                       Houston Siren 05/13/2020, 4:05 PM Orbie Pyo Colvin Caroli.Ed Risk analyst [phone] Office [phone]

## 2020-05-13 NOTE — PMR Pre-admission (Signed)
PMR Admission Coordinator Pre-Admission Assessment  Patient: Thomas Sampson is an 75 y.o., male MRN: [id number] DOB: [birth year] Height: $RemoveBeforeDE'5\' 9"'AwuAnVNFObXDADX$  (175.3 cm) Weight:                Insurance Information HMO:     PPO:      PCP:      IPA:      80/20:      OTHER:  PRIMARY: Medicare A and B      Policy#: [id number]      Subscriber: patient CM Name:        Phone#:       Fax#:   Pre-Cert#:        Employer: Retired Benefits:  Phone #:       Name: Checked in passport one source [employer] Date: 10/31/10     Deduct: $1556      Out of Pocket Max: none      Life Max: N/A  CIR: 100%      SNF: 100 days Outpatient: 80%     Co-Pay: 20% Home Health: 100%      Co-Pay: none DME: 80%     Co-Pay: 20% Providers: patient's choice  SECONDARY: BCBS state health plan      Policy#: [id number]      Phone#: [phone]  Financial Counselor:        Phone#:    The "Data Collection Information Summary" for patients in Inpatient Rehabilitation Facilities with attached "Privacy Act [place] Records" was provided and verbally reviewed with: Patient and Family  Emergency Contact Information Contact Information    Name Relation Home Work Mobile   [place]rell[place] Spouse [phone]     Dauntae, Derusha [phone]       Current Medical History  Patient Admitting Diagnosis: R MCA CVA  History of Present Illness:  A 75 y.o. right-handed male with history of chronic kidney disease stage IV, diabetes mellitus, hypertension, hyperlipidemia, tobacco abuse.  Per chart review patient lives with spouse.  Reportedly independent prior to admission retired.  1 level home with ramped entrance.  Presented 05/10/2020 to [facility] with left-sided weakness and slurred speech of acute onset.  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  No significant carotid  or vertebral artery stenosis in the neck.  Admission chemistries unremarkable except glucose 207, BUN 31, creatinine 2.26, hemoglobin A1c 8.1.  Echocardiogram with ejection fraction of 70 to 75% no wall motion abnormalities.  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  Lower extremity Dopplers negative for DVT.  Initially maintained on Cleviprex for blood pressure control.  Neurology follow-up currently maintained on aspirin 81 mg plus Plavix for 3 months then Plavix alone.  Dysphagia #1 nectar thick liquid diet.  Therapy evaluations completed due to patient's left-sided weakness and slurred speech recommendations of physical medicine rehab consult.  Complete NIHSS TOTAL: 10 Glasgow Coma Scale Score: 15  Past Medical History  Past Medical History:  Diagnosis Date  . Chronic kidney disease    kidney stones  . Diabetes mellitus without complication ([place])   . Hyperlipidemia   . Hypertension   . Tinea pedis     Family History  family history includes Cancer in his mother; Diabetes in his brother; Heart attack in his father and sister; Heart disease in his sister.  Prior Rehab/Hospitalizations:  Has the patient had prior rehab or hospitalizations prior to admission? No  Has the patient had major surgery during 100 days prior to admission? No  Current Medications   Current Facility-Administered Medications:  .   stroke: mapping our early stages of recovery book, , Does not apply, Once, Sethi, Pramod S, MD .  0.9 %  sodium chloride infusion, , Intravenous, Continuous, Rosalin Hawking, MD, Last Rate: 50 mL/hr at 05/13/20 2100, New Bag at 05/13/20 2100 .  acetaminophen (TYLENOL) tablet 650 mg, 650 mg, Oral, Q4H PRN, 650 mg at 05/14/20 0949 **OR** acetaminophen (TYLENOL) 160 MG/5ML solution 650 mg, 650 mg, Per Tube, Q4H PRN, 650 mg at 05/12/20 0744 **OR** acetaminophen (TYLENOL) suppository 650 mg, 650 mg, Rectal, Q4H PRN, Leonie Man,  Pramod S, MD .  amLODipine (NORVASC) tablet 2.5 mg, 2.5 mg, Per Tube, Daily, Erick Colace, NP, 2.5 mg at 05/14/20 0947 .  aspirin chewable tablet 324 mg, 324 mg, Per Tube, Daily, Rosalin Hawking, MD, 324 mg at 05/14/20 0948 .  atorvastatin (LIPITOR) tablet 40 mg, 40 mg, Per Tube, Daily, Garvin Fila, MD, 40 mg at 05/14/20 0948 .  chlorhexidine gluconate (MEDLINE KIT) (PERIDEX) 0.12 % solution 15 mL, 15 mL, Mouth Rinse, BID, Bowser, Grace E, NP, 15 mL at 05/14/20 0953 .  Chlorhexidine Gluconate Cloth 2 % PADS 6 each, 6 each, Topical, Daily, Derek Jack, MD, 6 each at 05/14/20 [phone] .  clopidogrel (PLAVIX) tablet 75 mg, 75 mg, Per Tube, Daily, Garvin Fila, MD, 75 mg at 05/14/20 0948 .  hydrALAZINE (APRESOLINE) injection 10 mg, 10 mg, Intravenous, Q4H PRN, Salvadore Dom E, NP .  insulin aspart (novoLOG) injection 0-20 Units, 0-20 Units, Subcutaneous, TID WC, McQuilla, Jai B, MD .  insulin aspart (novoLOG) injection 0-5 Units, 0-5 Units, Subcutaneous, QHS, McQuilla, Jai B, MD .  insulin aspart (novoLOG) injection 12 Units, 12 Units, Subcutaneous, TID WC, McQuilla, Jai B, MD .  insulin glargine (LANTUS) injection 42 Units, 42 Units, Subcutaneous, Daily, Damita Dunnings B, MD, 42 Units at 05/14/20 0959 .  MEDLINE mouth rinse, 15 mL, Mouth Rinse, q12n4p, Garvin Fila, MD, 15 mL at 05/13/20 1745 .  ondansetron (ZOFRAN) injection 4 mg, 4 mg, Intravenous, Q6H PRN, Magdalen Spatz, NP, 4 mg at 05/11/20 1634 .  pantoprazole (PROTONIX) EC tablet 40 mg, 40 mg, Oral, Daily, Rosalin Hawking, MD, 40 mg at 05/14/20 0948 .  pregabalin (LYRICA) capsule 50 mg, 50 mg, Per Tube, BID, Garvin Fila, MD, 50 mg at 05/14/20 0948 .  Resource ThickenUp Clear, , Oral, PRN, Garvin Fila, MD .  senna-docusate (Senokot-S) tablet 1 tablet, 1 tablet, Oral, QHS PRN, Garvin Fila, MD  Patients Current Diet:  Diet Order            DIET - DYS 1 Room service appropriate? No; Fluid consistency: Nectar Thick  Diet  effective now                 Precautions / Restrictions Precautions Precautions: Fall,Other (comment) Precaution Comments: L inattention Restrictions Weight Bearing Restrictions: No   Has the patient had 2 or more falls or a fall with injury in the past year?No  Prior Activity Level Community (5-7x/wk): Went out daily, was driving.  Prior Functional Level Prior Function Level of Independence: Independent Comments: Retired, used to work in Psych[employer], occupational for [employer]: Did the patient need help bathing, dressing, using the toilet or eating?  Independent  Indoor Mobility:  Did the patient need assistance with walking from room to room (with or without device)? Independent  Stairs: Did the patient need assistance with internal or external stairs (with or without device)? Independent  Functional Cognition: Did the patient need help planning regular tasks such as shopping or remembering to take medications? Independent  Home Assistive Devices / Equipment Home Equipment: Shower seat - built in,Walker - 2 wheels  Prior Device Use: Indicate devices/aids used by the patient prior to current illness, exacerbation or injury? Cane as needed  Current Functional Level Cognition  Arousal/Alertness: Awake/alert Overall Cognitive Status: Impaired/Different from baseline Orientation Level: Oriented X4 Following Commands: Follows one step commands consistently,Follows multi-step commands inconsistently Safety/Judgement: Decreased awareness of safety,Decreased awareness of deficits General Comments: pt alert and oriented, able to voice need for thickened liquid and other safety measures, but needing cues to attend to L side when scanning room. making corrections well to cues, highly motivated. 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 He is able to initiate minimal movement Lt shoulder LUE Coordination: decreased fine motor,decreased gross motor  Lower Extremity Assessment: Defer to PT evaluation LLE Deficits / Details: Grossly 4/5 LLE Sensation: history of peripheral neuropathy LLE Coordination: decreased gross motor    ADLs  Overall ADL's : Needs assistance/impaired Eating/Feeding: Set up,Sitting,Bed level Grooming: Wash/dry hands,Wash/dry face,Oral care,Brushing hair,Minimal assistance,Sitting Grooming Details (indicate cue type and reason): sitting EOB Upper Body Bathing: Moderate assistance,Sitting Lower Body Bathing: Maximal assistance,Sit to/from stand Upper Body Dressing : Moderate assistance,Sitting Lower Body Dressing: Maximal assistance,Sit to/from stand Lower Body Dressing Details (indicate cue type and reason): Pt able to don socks with mod A Toilet Transfer: Maximal assistance,+2 for physical assistance,+2 for safety/equipment,BSC Toileting- Clothing Manipulation and Hygiene: Maximal assistance,Sit to/from stand Functional mobility during ADLs: Moderate assistance,+2 for physical assistance,+2 for safety/equipment General ADL Comments: Pt limited by Lt hemiparesis and Lt inattention as well as visual field deficit    Mobility  Overal bed mobility: Needs Assistance Bed Mobility: Supine to Sit Supine to sit: Min assist,HOB elevated General bed mobility comments: cues for moving BLE to EOB, needing minA to complete movement. minA to elevate trunk from elevated HOB. pt then able to steady in sitting  EOB without assist    Transfers  Overall transfer level: Needs assistance Equipment used: 1 person hand held assist,2 person hand held assist Transfers: Sit to/from [place] to Stand: Min assist Stand pivot transfers: Min assist,+2 physical assistance General transfer comment: minA of 1 to power up from chair, pt with no noted buckling in LLE, able to maintain wt shift in midline. minA of 2 to take lateral steps and pivot to recliner. RUE support with stance on LLE    Ambulation / Gait / Stairs / Wheelchair Mobility  Ambulation/Gait Ambulation/Gait assistance: Min assist,+2 physical assistance Gait Distance (Feet): 5 Feet Assistive device: 2 person hand held assist Gait Pattern/deviations: Step-to pattern,Decreased stride length General Gait Details: small lateral steps to R, increased RUE support with stance on LLE to advance RLE. small steps with increased difficulty coordinating placement of LLE Gait velocity: decreased Gait velocity interpretation: <1.31 ft/sec, indicative of household ambulator  Posture / Balance Dynamic Sitting Balance Sitting balance - Comments: Able to sit unsupported with supervision, left lateral lean with challenge Balance Overall balance assessment: Needs assistance Sitting-balance support: Feet supported Sitting balance-Leahy Scale: Fair Sitting balance - Comments: Able to sit unsupported with supervision, left lateral lean with challenge Postural control: Left lateral lean Standing balance support: Single extremity supported Standing balance-Leahy Scale: Poor Standing balance comment: left lateral lean, minA of 2 for dynamic stance, minA of 1 for static stance    Special needs/care consideration Continuous Drip IV  0.9% NS at 125 ml/hr, Skin Right groin dressing over puncture site and Diabetic management Currently on lantus and novolog insulin in hospital for DM     Previous Home Environment (from acute therapy  documentation) Living Arrangements: Spouse/significant other  Lives With: Spouse Available Help at Discharge: Family,Available 24 hours/day Type of Home: House Home Layout: One level Home Access: Ramped entrance Bathroom Shower/Tub: Walk-in shower  Discharge Living Setting Plans for Discharge Living Setting: Patient's home,House,Lives with (comment) (Lives with wife.) Type of Home at Discharge: House Discharge Home Layout: One level Discharge Home Access: Ramped entrance Discharge Bathroom Shower/Tub: Walk-in shower,Door Discharge Bathroom Toilet: Standard Discharge Bathroom Accessibility: Yes How Accessible: Accessible via walker  Social/Family/Support Systems Patient Roles: Spouse,Parent (Has a wife and a son.) Contact Information: Hansen Carino - wife Anticipated Caregiver: Wife Ability/Limitations of Caregiver: Wife is retired and can provide supervision to light assistance Caregiver Availability: 24/7 Discharge Plan Discussed with Primary Caregiver: Yes Is Caregiver In Agreement with Plan?: Yes Does Caregiver/Family have Issues with Lodging/Transportation while Pt is in Rehab?: No  Goals Patient/Family Goal for Rehab: PT S, OT S/min, SLP mod I/S goals Expected length of stay: 14-20 days Cultural Considerations: none Pt/Family Agrees to Admission and willing to participate: Yes Program Orientation Provided & Reviewed with Pt/Caregiver Including Roles  & Responsibilities: Yes  Decrease burden of Care through IP rehab admission: N/A  Possible need for SNF placement upon discharge: Yes, if patient does not progress to point where wife and family can manage care at home after CIR stay.  Patient Condition: This patient's condition remains as documented in the consult dated 05/13/20, in which the Rehabilitation Physician determined and documented that the patient's condition is appropriate for intensive rehabilitative care in an inpatient rehabilitation facility. Will admit to  inpatient rehab today.  Preadmission Screen Completed By:  Retta Diones, RN, 05/14/2020 10:26 AM ______________________________________________________________________   Discussed status with Dr. Naaman Plummer on 05/14/20 at 0930 and received approval for admission today.  Admission Coordinator:  Retta Diones, time 1027 Sudie Grumbling 05/14/20

## 2020-05-14 ENCOUNTER — Inpatient Hospital Stay (HOSPITAL_COMMUNITY)
Admission: RE | Admit: 2020-05-14 | Discharge: 2020-05-28 | DRG: 057 | Disposition: A | Discharge status: Home Health | Payer: Medicare Other | Source: Intra-hospital | Attending: Physical Medicine & Rehabilitation | Admitting: Physical Medicine & Rehabilitation

## 2020-05-14 ENCOUNTER — Encounter (HOSPITAL_COMMUNITY)
Admission: EM | Disposition: A | Discharge status: Rehabilitation Facility | Payer: Self-pay | Source: Home / Self Care | Attending: Neurology

## 2020-05-14 DIAGNOSIS — Z87891 Personal history of nicotine dependence: Secondary | ICD-10-CM | POA: Diagnosis not present

## 2020-05-14 DIAGNOSIS — E1165 Type 2 diabetes mellitus with hyperglycemia: Secondary | ICD-10-CM | POA: Diagnosis not present

## 2020-05-14 DIAGNOSIS — Z6832 Body mass index (BMI) 32.0-32.9, adult: Secondary | ICD-10-CM

## 2020-05-14 DIAGNOSIS — Z8249 Family history of ischemic heart disease and other diseases of the circulatory system: Secondary | ICD-10-CM | POA: Diagnosis not present

## 2020-05-14 DIAGNOSIS — M940 Chondrocostal junction syndrome [Tietze]: Secondary | ICD-10-CM | POA: Diagnosis present

## 2020-05-14 DIAGNOSIS — R1310 Dysphagia, unspecified: Secondary | ICD-10-CM | POA: Diagnosis present

## 2020-05-14 DIAGNOSIS — Z794 Long term (current) use of insulin: Secondary | ICD-10-CM

## 2020-05-14 DIAGNOSIS — M19011 Primary osteoarthritis, right shoulder: Secondary | ICD-10-CM | POA: Diagnosis present

## 2020-05-14 DIAGNOSIS — G8929 Other chronic pain: Secondary | ICD-10-CM | POA: Diagnosis present

## 2020-05-14 DIAGNOSIS — I6389 Other cerebral infarction: Secondary | ICD-10-CM

## 2020-05-14 DIAGNOSIS — I10 Essential (primary) hypertension: Secondary | ICD-10-CM

## 2020-05-14 DIAGNOSIS — I63511 Cerebral infarction due to unspecified occlusion or stenosis of right middle cerebral artery: Secondary | ICD-10-CM | POA: Diagnosis present

## 2020-05-14 DIAGNOSIS — Z833 Family history of diabetes mellitus: Secondary | ICD-10-CM

## 2020-05-14 DIAGNOSIS — I6601 Occlusion and stenosis of right middle cerebral artery: Secondary | ICD-10-CM | POA: Diagnosis not present

## 2020-05-14 DIAGNOSIS — N184 Chronic kidney disease, stage 4 (severe): Secondary | ICD-10-CM | POA: Diagnosis present

## 2020-05-14 DIAGNOSIS — E1122 Type 2 diabetes mellitus with diabetic chronic kidney disease: Secondary | ICD-10-CM | POA: Diagnosis present

## 2020-05-14 DIAGNOSIS — I129 Hypertensive chronic kidney disease with stage 1 through stage 4 chronic kidney disease, or unspecified chronic kidney disease: Secondary | ICD-10-CM | POA: Diagnosis present

## 2020-05-14 DIAGNOSIS — B370 Candidal stomatitis: Secondary | ICD-10-CM | POA: Diagnosis present

## 2020-05-14 DIAGNOSIS — E785 Hyperlipidemia, unspecified: Secondary | ICD-10-CM | POA: Diagnosis present

## 2020-05-14 DIAGNOSIS — Z7982 Long term (current) use of aspirin: Secondary | ICD-10-CM

## 2020-05-14 DIAGNOSIS — Z885 Allergy status to narcotic agent status: Secondary | ICD-10-CM | POA: Diagnosis not present

## 2020-05-14 DIAGNOSIS — E1142 Type 2 diabetes mellitus with diabetic polyneuropathy: Secondary | ICD-10-CM

## 2020-05-14 DIAGNOSIS — I69391 Dysphagia following cerebral infarction: Secondary | ICD-10-CM | POA: Diagnosis not present

## 2020-05-14 DIAGNOSIS — Z7984 Long term (current) use of oral hypoglycemic drugs: Secondary | ICD-10-CM | POA: Diagnosis not present

## 2020-05-14 DIAGNOSIS — I69354 Hemiplegia and hemiparesis following cerebral infarction affecting left non-dominant side: Principal | ICD-10-CM

## 2020-05-14 DIAGNOSIS — Z79899 Other long term (current) drug therapy: Secondary | ICD-10-CM

## 2020-05-14 DIAGNOSIS — I609 Nontraumatic subarachnoid hemorrhage, unspecified: Secondary | ICD-10-CM | POA: Diagnosis not present

## 2020-05-14 DIAGNOSIS — E7800 Pure hypercholesterolemia, unspecified: Secondary | ICD-10-CM | POA: Diagnosis not present

## 2020-05-14 DIAGNOSIS — R0989 Other specified symptoms and signs involving the circulatory and respiratory systems: Secondary | ICD-10-CM | POA: Diagnosis not present

## 2020-05-14 DIAGNOSIS — G4700 Insomnia, unspecified: Secondary | ICD-10-CM | POA: Diagnosis not present

## 2020-05-14 DIAGNOSIS — M25511 Pain in right shoulder: Secondary | ICD-10-CM

## 2020-05-14 DIAGNOSIS — I69322 Dysarthria following cerebral infarction: Secondary | ICD-10-CM | POA: Diagnosis not present

## 2020-05-14 DIAGNOSIS — E669 Obesity, unspecified: Secondary | ICD-10-CM | POA: Diagnosis present

## 2020-05-14 DIAGNOSIS — E1151 Type 2 diabetes mellitus with diabetic peripheral angiopathy without gangrene: Secondary | ICD-10-CM | POA: Diagnosis present

## 2020-05-14 DIAGNOSIS — E1169 Type 2 diabetes mellitus with other specified complication: Secondary | ICD-10-CM | POA: Diagnosis not present

## 2020-05-14 DIAGNOSIS — Z716 Tobacco abuse counseling: Secondary | ICD-10-CM

## 2020-05-14 DIAGNOSIS — K912 Postsurgical malabsorption, not elsewhere classified: Secondary | ICD-10-CM | POA: Diagnosis present

## 2020-05-14 DIAGNOSIS — F482 Pseudobulbar affect: Secondary | ICD-10-CM | POA: Diagnosis not present

## 2020-05-14 HISTORY — PX: LOOP RECORDER INSERTION: EP1214

## 2020-05-14 LAB — CBC
HCT: 35.7 % — ABNORMAL LOW (ref 39.0–52.0)
Hemoglobin: 11.7 g/dL — ABNORMAL LOW (ref 13.0–17.0)
MCH: 31.4 pg (ref 26.0–34.0)
MCHC: 32.8 g/dL (ref 30.0–36.0)
MCV: 95.7 fL (ref 80.0–100.0)
Platelets: 194 10*3/uL (ref 150–400)
RBC: 3.73 MIL/uL — ABNORMAL LOW (ref 4.22–5.81)
RDW: 13.2 % (ref 11.5–15.5)
WBC: 10.3 10*3/uL (ref 4.0–10.5)
nRBC: 0 % (ref 0.0–0.2)

## 2020-05-14 LAB — GLUCOSE, CAPILLARY
Glucose-Capillary: 194 mg/dL — ABNORMAL HIGH (ref 70–99)
Glucose-Capillary: 255 mg/dL — ABNORMAL HIGH (ref 70–99)
Glucose-Capillary: 372 mg/dL — ABNORMAL HIGH (ref 70–99)
Glucose-Capillary: 172 mg/dL — ABNORMAL HIGH (ref 70–99)
Glucose-Capillary: 201 mg/dL — ABNORMAL HIGH (ref 70–99)

## 2020-05-14 LAB — BASIC METABOLIC PANEL
Anion gap: 4 — ABNORMAL LOW (ref 5–15)
BUN: 24 mg/dL — ABNORMAL HIGH (ref 8–23)
CO2: 27 mmol/L (ref 22–32)
Calcium: 8.2 mg/dL — ABNORMAL LOW (ref 8.9–10.3)
Chloride: 109 mmol/L (ref 98–111)
Creatinine, Ser: 1.82 mg/dL — ABNORMAL HIGH (ref 0.61–1.24)
GFR, Estimated: 38 mL/min — ABNORMAL LOW (ref >60)
Glucose, Bld: 176 mg/dL — ABNORMAL HIGH (ref 70–99)
Potassium: 3.5 mmol/L (ref 3.5–5.1)
Sodium: 140 mmol/L (ref 135–145)

## 2020-05-14 SURGERY — LOOP RECORDER INSERTION
Anesthesia: LOCAL

## 2020-05-14 MED ORDER — INSULIN ASPART 100 UNIT/ML ~~LOC~~ SOLN
0.0000 [IU] | Freq: Three times a day (TID) | SUBCUTANEOUS | Status: DC
Start: 2020-05-14 — End: 2020-05-28
  Administered 2020-05-14: 4 [IU] via SUBCUTANEOUS
  Administered 2020-05-15: 20 [IU] via SUBCUTANEOUS
  Administered 2020-05-15 (×2): 7 [IU] via SUBCUTANEOUS
  Administered 2020-05-16: 11 [IU] via SUBCUTANEOUS
  Administered 2020-05-16: 7 [IU] via SUBCUTANEOUS
  Administered 2020-05-16 – 2020-05-17 (×3): 4 [IU] via SUBCUTANEOUS
  Administered 2020-05-17: 11 [IU] via SUBCUTANEOUS
  Administered 2020-05-18: 4 [IU] via SUBCUTANEOUS
  Administered 2020-05-18: 3 [IU] via SUBCUTANEOUS
  Administered 2020-05-18: 7 [IU] via SUBCUTANEOUS
  Administered 2020-05-19 (×2): 4 [IU] via SUBCUTANEOUS
  Administered 2020-05-19: 11 [IU] via SUBCUTANEOUS
  Administered 2020-05-20: 4 [IU] via SUBCUTANEOUS
  Administered 2020-05-20: 11 [IU] via SUBCUTANEOUS
  Administered 2020-05-21: 7 [IU] via SUBCUTANEOUS
  Administered 2020-05-21: 11 [IU] via SUBCUTANEOUS
  Administered 2020-05-21: 3 [IU] via SUBCUTANEOUS
  Administered 2020-05-22 – 2020-05-23 (×4): 4 [IU] via SUBCUTANEOUS
  Administered 2020-05-23: 3 [IU] via SUBCUTANEOUS
  Administered 2020-05-24 (×3): 4 [IU] via SUBCUTANEOUS
  Administered 2020-05-25: 7 [IU] via SUBCUTANEOUS
  Administered 2020-05-25 – 2020-05-26 (×4): 3 [IU] via SUBCUTANEOUS
  Administered 2020-05-27 (×2): 4 [IU] via SUBCUTANEOUS
  Administered 2020-05-28: 3 [IU] via SUBCUTANEOUS

## 2020-05-14 MED ORDER — CLOPIDOGREL BISULFATE 75 MG PO TABS
75.0000 mg | ORAL_TABLET | Freq: Every day | ORAL | Status: DC
  Administered 2020-05-15 – 2020-05-28 (×14): 75 mg via ORAL
  Filled 2020-05-14 (×14): qty 1

## 2020-05-14 MED ORDER — ATORVASTATIN CALCIUM 40 MG PO TABS: 40.0000 mg | ORAL_TABLET | Freq: Every day | ORAL | 0 refills | Status: DC

## 2020-05-14 MED ORDER — DEXTROMETHORPHAN-QUINIDINE 20-10 MG PO CAPS
1.0000 | ORAL_CAPSULE | Freq: Every day | ORAL | Status: DC
  Administered 2020-05-15 – 2020-05-28 (×14): 1 via ORAL
  Filled 2020-05-14 (×15): qty 1

## 2020-05-14 MED ORDER — INSULIN ASPART 100 UNIT/ML ~~LOC~~ SOLN
12.0000 [IU] | Freq: Three times a day (TID) | SUBCUTANEOUS | Status: DC
  Administered 2020-05-14: 12 [IU] via SUBCUTANEOUS

## 2020-05-14 MED ORDER — ACETAMINOPHEN 325 MG PO TABS
650.0000 mg | ORAL_TABLET | ORAL | Status: DC | PRN
Start: 2020-05-14 — End: 2020-05-28
  Administered 2020-05-17: 650 mg via ORAL
  Filled 2020-05-14: qty 2

## 2020-05-14 MED ORDER — INSULIN ASPART 100 UNIT/ML ~~LOC~~ SOLN
12.0000 [IU] | Freq: Three times a day (TID) | SUBCUTANEOUS | Status: DC
  Administered 2020-05-14 – 2020-05-28 (×41): 12 [IU] via SUBCUTANEOUS

## 2020-05-14 MED ORDER — ASPIRIN 81 MG PO CHEW
324.0000 mg | CHEWABLE_TABLET | Freq: Every day | ORAL | Status: DC
  Administered 2020-05-15 – 2020-05-20 (×6): 324 mg via ORAL
  Filled 2020-05-14 (×7): qty 4

## 2020-05-14 MED ORDER — ASPIRIN 81 MG PO CHEW: 324.0000 mg | CHEWABLE_TABLET | Freq: Every day | ORAL | 0 refills | Status: DC

## 2020-05-14 MED ORDER — LIDOCAINE-EPINEPHRINE 1 %-1:100000 IJ SOLN
INTRAMUSCULAR | Status: DC | PRN
  Administered 2020-05-14: 10 mL

## 2020-05-14 MED ORDER — AMLODIPINE BESYLATE 2.5 MG PO TABS
2.5000 mg | ORAL_TABLET | Freq: Every day | ORAL | Status: DC
  Administered 2020-05-15 – 2020-05-26 (×12): 2.5 mg via ORAL
  Filled 2020-05-14 (×13): qty 1

## 2020-05-14 MED ORDER — INSULIN ASPART 100 UNIT/ML ~~LOC~~ SOLN: 0.0000 [IU] | Freq: Every day | SUBCUTANEOUS | Status: DC

## 2020-05-14 MED ORDER — RESOURCE THICKENUP CLEAR PO POWD
ORAL | Status: DC | PRN
  Filled 2020-05-14: qty 125

## 2020-05-14 MED ORDER — INSULIN GLARGINE 100 UNIT/ML ~~LOC~~ SOLN
42.0000 [IU] | Freq: Every day | SUBCUTANEOUS | Status: DC
  Administered 2020-05-14: 42 [IU] via SUBCUTANEOUS
  Filled 2020-05-14: qty 0.42

## 2020-05-14 MED ORDER — SENNOSIDES-DOCUSATE SODIUM 8.6-50 MG PO TABS: 1.0000 | ORAL_TABLET | Freq: Every evening | ORAL | Status: DC | PRN

## 2020-05-14 MED ORDER — ATORVASTATIN CALCIUM 40 MG PO TABS
40.0000 mg | ORAL_TABLET | Freq: Every day | ORAL | Status: DC
  Administered 2020-05-15 – 2020-05-28 (×14): 40 mg via ORAL
  Filled 2020-05-14 (×14): qty 1

## 2020-05-14 MED ORDER — PANTOPRAZOLE SODIUM 40 MG PO TBEC
40.0000 mg | DELAYED_RELEASE_TABLET | Freq: Every day | ORAL | Status: DC
  Administered 2020-05-15 – 2020-05-19 (×5): 40 mg via ORAL
  Filled 2020-05-14 (×6): qty 1

## 2020-05-14 MED ORDER — ACETAMINOPHEN 160 MG/5ML PO SOLN
650.0000 mg | ORAL | Status: DC | PRN
Start: 2020-05-14 — End: 2020-05-28

## 2020-05-14 MED ORDER — ACETAMINOPHEN 650 MG RE SUPP: 650.0000 mg | RECTAL | Status: DC | PRN

## 2020-05-14 MED ORDER — PREGABALIN 25 MG PO CAPS
50.0000 mg | ORAL_CAPSULE | Freq: Two times a day (BID) | ORAL | Status: DC
  Administered 2020-05-14 – 2020-05-15 (×2): 50 mg via ORAL
  Filled 2020-05-14 (×2): qty 2

## 2020-05-14 MED ORDER — INSULIN ASPART 100 UNIT/ML ~~LOC~~ SOLN
0.0000 [IU] | Freq: Three times a day (TID) | SUBCUTANEOUS | Status: DC
  Administered 2020-05-14: 20 [IU] via SUBCUTANEOUS

## 2020-05-14 MED ORDER — CLOPIDOGREL BISULFATE 75 MG PO TABS: 75.0000 mg | ORAL_TABLET | Freq: Every day | ORAL | 0 refills | Status: DC

## 2020-05-14 MED ORDER — INSULIN GLARGINE 100 UNIT/ML ~~LOC~~ SOLN
42.0000 [IU] | Freq: Every day | SUBCUTANEOUS | Status: DC
  Administered 2020-05-15: 42 [IU] via SUBCUTANEOUS
  Filled 2020-05-14: qty 0.42

## 2020-05-14 SURGICAL SUPPLY — 2 items
MONITOR REVEAL LINQ II (Prosthesis & Implant Heart) ×2 IMPLANT
PACK LOOP INSERTION (CUSTOM PROCEDURE TRAY) ×2 IMPLANT

## 2020-05-14 NOTE — Progress Notes (Signed)
Thomas Diones, RN  Rehab Admission Coordinator  Nursing  PMR Pre-admission    Signed  Date of Service:  05/13/2020 4:19 PM      Related encounter: ED to Hosp-Admission (Current) from 05/10/2020 in [facility]       Signed          Show:Clear all '[x]' Manual'[x]' Template'[x]' Copied  Added by: '[x]' Karl Bales, Evalee Mutton, RN   '[]' Hover for details  PMR Admission Coordinator Pre-Admission Assessment  Patient: Thomas Sampson is an 75 y.o., male MRN: [id number] DOB: 04-26-1945 Height: '5\' 9"'  (175.3 cm) Weight:                                                                                                                                                    Insurance Information HMO:     PPO:      PCP:      IPA:      80/20:      OTHER:  PRIMARY: Medicare A and B      Policy#: [id number]      Subscriber: patient CM Name:        Phone#:       Fax#:   Pre-Cert#:        Employer: Retired Benefits:  Phone #:       Name: Checked in passport one source [employer] Date: 10/31/10     Deduct: $1556      Out of Pocket Max: none      Life Max: N/A  CIR: 100%      SNF: 100 days Outpatient: 80%     Co-Pay: 20% Home Health: 100%      Co-Pay: none DME: 80%     Co-Pay: 20% Providers: patient's choice  SECONDARY: BCBS state health plan      Policy#: [id number]      Phone#: [phone]  Financial Counselor:        Phone#:    The "Data Collection Information Summary" for patients in Inpatient Rehabilitation Facilities with attached "Privacy Act [place] Records" was provided and verbally reviewed with: Patient and Family  Emergency Contact Information         Contact Information    Name Relation Home Work Mobile   [place] Spouse [phone]     Arshawn, [place] [phone]       Current Medical History  Patient Admitting Diagnosis: R MCA CVA  History of Present Illness:  A 75 y.o.right-handed malewith history of chronic kidney disease  stage IV, diabetes mellitus, hypertension, hyperlipidemia, tobacco abuse. Per chart review patient lives with spouse. Reportedly independent prior to admission retired. 1 level home with ramped entrance. [facility]ed [facility]/10/[facility] to [facility] with left-sided weakness and slurred speech of acute onset. 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 MRA as well as CT angiogram of head and  neck showed occlusion of right M3 branch superior division compatible with embolus. No significant carotid or vertebral artery stenosis in the neck. Admission chemistries unremarkable except glucose 207, BUN 31, creatinine 2.26, hemoglobin A1c 8.1. Echocardiogram with ejection fraction of 70 to 75% no wall motion abnormalities. 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 Lower extremity Dopplers negative for DVT. Initially maintained on Cleviprex for blood pressure control. Neurology follow-up currently maintained on aspirin 81 mg plus Plavix for 3 months then Plavix alone. Dysphagia #1 nectar thick liquid diet. Therapy evaluations completed due to patient's left-sided weakness and slurred speech recommendations of physical medicine rehab consult.  Complete NIHSS TOTAL: 10 Glasgow Coma Scale Score: 15  Past Medical History      Past Medical History:  Diagnosis Date  . Chronic kidney disease    kidney stones  . Diabetes mellitus without complication ([place])   . Hyperlipidemia   . Hypertension   . Tinea pedis     Family History  family history includes Cancer in his mother; Diabetes in his brother; Heart attack in his father and sister; Heart disease in his sister.  Prior Rehab/Hospitalizations:  Has the patient had prior rehab or hospitalizations prior to admission? No  Has the patient had major surgery during 100 days prior to admission? No  Current Medications   Current  Facility-Administered Medications:  .   stroke: mapping our early stages of recovery book, , Does not apply, Once, Sethi, Pramod S, MD .  0.9 %  sodium chloride infusion, , Intravenous, Continuous, Rosalin Hawking, MD, Last Rate: 50 mL/hr at 05/13/20 2100, New Bag at 05/13/20 2100 .  acetaminophen (TYLENOL) tablet 650 mg, 650 mg, Oral, Q4H PRN, 650 mg at 05/14/20 0949 **OR** acetaminophen (TYLENOL) 160 MG/5ML solution 650 mg, 650 mg, Per Tube, Q4H PRN, 650 mg at 05/12/20 0744 **OR** acetaminophen (TYLENOL) suppository 650 mg, 650 mg, Rectal, Q4H PRN, Leonie Man, Pramod S, MD .  amLODipine (NORVASC) tablet 2.5 mg, 2.5 mg, Per Tube, Daily, Erick Colace, NP, 2.5 mg at 05/14/20 0947 .  aspirin chewable tablet 324 mg, 324 mg, Per Tube, Daily, Rosalin Hawking, MD, 324 mg at 05/14/20 0948 .  atorvastatin (LIPITOR) tablet 40 mg, 40 mg, Per Tube, Daily, Garvin Fila, MD, 40 mg at 05/14/20 0948 .  chlorhexidine gluconate (MEDLINE KIT) (PERIDEX) 0.12 % solution 15 mL, 15 mL, Mouth Rinse, BID, Bowser, Grace E, NP, 15 mL at 05/14/20 0953 .  Chlorhexidine Gluconate Cloth 2 % PADS 6 each, 6 each, Topical, Daily, Derek Jack, MD, 6 each at 05/14/20 [phone] .  clopidogrel (PLAVIX) tablet 75 mg, 75 mg, Per Tube, Daily, Garvin Fila, MD, 75 mg at 05/14/20 0948 .  hydrALAZINE (APRESOLINE) injection 10 mg, 10 mg, Intravenous, Q4H PRN, Salvadore Dom E, NP .  insulin aspart (novoLOG) injection 0-20 Units, 0-20 Units, Subcutaneous, TID WC, McQuilla, Jai B, MD .  insulin aspart (novoLOG) injection 0-5 Units, 0-5 Units, Subcutaneous, QHS, McQuilla, Jai B, MD .  insulin aspart (novoLOG) injection 12 Units, 12 Units, Subcutaneous, TID WC, McQuilla, Jai B, MD .  insulin glargine (LANTUS) injection 42 Units, 42 Units, Subcutaneous, Daily, Damita Dunnings B, MD, 42 Units at 05/14/20 0959 .  MEDLINE mouth rinse, 15 mL, Mouth Rinse, q12n4p, Garvin Fila, MD, 15 mL at 05/13/20 1745 .  ondansetron (ZOFRAN) injection 4 mg, 4 mg,  Intravenous, Q6H PRN, Magdalen Spatz, NP, 4 mg at 05/11/20 1634 .  pantoprazole (PROTONIX)  EC tablet 40 mg, 40 mg, Oral, Daily, Rosalin Hawking, MD, 40 mg at 05/14/20 0948 .  pregabalin (LYRICA) capsule 50 mg, 50 mg, Per Tube, BID, Garvin Fila, MD, 50 mg at 05/14/20 0948 .  Resource ThickenUp Clear, , Oral, PRN, Garvin Fila, MD .  senna-docusate (Senokot-S) tablet 1 tablet, 1 tablet, Oral, QHS PRN, Garvin Fila, MD  Patients Current Diet:     Diet Order                  DIET - DYS 1 Room service appropriate? No; Fluid consistency: Nectar Thick  Diet effective now                  Precautions / Restrictions Precautions Precautions: Fall,Other (comment) Precaution Comments: L inattention Restrictions Weight Bearing Restrictions: No   Has the patient had 2 or more falls or a fall with injury in the past year?No  Prior Activity Level Community (5-7x/wk): Went out daily, was driving.  Prior Functional Level Prior Function Level of Independence: Independent Comments: Retired, used to work in [employer], occupational for [employer]: Did the patient need help bathing, dressing, using the toilet or eating?  Independent  Indoor Mobility: Did the patient need assistance with walking from room to room (with or without device)? Independent  Stairs: Did the patient need assistance with internal or external stairs (with or without device)? Independent  Functional Cognition: Did the patient need help planning regular tasks such as shopping or remembering to take medications? Independent  Home Assistive Devices / Equipment Home Equipment: Shower seat - built in,Walker - 2 wheels  Prior Device Use: Indicate devices/aids used by the patient prior to current illness, exacerbation or injury? Cane as needed  Current Functional Level Cognition  Arousal/Alertness: Awake/alert Overall Cognitive Status: Impaired/Different from baseline Orientation Level:  Oriented X4 Following Commands: Follows one step commands consistently,Follows multi-step commands inconsistently Safety/Judgement: Decreased awareness of safety,Decreased awareness of deficits General Comments: pt alert and oriented, able to voice need for thickened liquid and other safety measures, but needing cues to attend to L side when scanning room. making corrections well to cues, highly motivated. 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 He is able to initiate minimal movement Lt shoulder LUE Coordination: decreased fine motor,decreased gross motor  Lower Extremity Assessment: Defer to PT evaluation LLE Deficits / Details: Grossly 4/5 LLE Sensation: history of peripheral neuropathy LLE Coordination: decreased gross motor    ADLs  Overall ADL's : Needs assistance/impaired Eating/Feeding: Set up,Sitting,Bed level Grooming: Wash/dry hands,Wash/dry face,Oral care,Brushing hair,Minimal assistance,Sitting Grooming Details (indicate cue type and reason): sitting EOB Upper Body Bathing: Moderate assistance,Sitting Lower Body Bathing: Maximal assistance,Sit to/from stand Upper Body Dressing : Moderate assistance,Sitting Lower Body Dressing: Maximal assistance,Sit to/from stand Lower Body Dressing Details (indicate cue type and reason): Pt able to don socks with mod A Toilet Transfer: Maximal assistance,+2 for physical assistance,+2 for  safety/equipment,BSC Toileting- Clothing Manipulation and Hygiene: Maximal assistance,Sit to/from stand Functional mobility during ADLs: Moderate assistance,+2 for physical assistance,+2 for safety/equipment General ADL Comments: Pt limited by Lt hemiparesis and Lt inattention as well as  visual field deficit    Mobility  Overal bed mobility: Needs Assistance Bed Mobility: Supine to Sit Supine to sit: Min assist,HOB elevated General bed mobility comments: cues for moving BLE to EOB, needing minA to complete movement. minA to elevate trunk from elevated HOB. pt then able to steady in sitting EOB without assist    Transfers  Overall transfer level: Needs assistance Equipment used: 1 person hand held assist,2 person hand held assist Transfers: Sit to/from [place] to Stand: Min assist Stand pivot transfers: Min assist,+2 physical assistance General transfer comment: minA of 1 to power up from chair, pt with no noted buckling in LLE, able to maintain wt shift in midline. minA of 2 to take lateral steps and pivot to recliner. RUE support with stance on LLE    Ambulation / Gait / Stairs / Wheelchair Mobility  Ambulation/Gait Ambulation/Gait assistance: Min assist,+2 physical assistance Gait Distance (Feet): 5 Feet Assistive device: 2 person hand held assist Gait Pattern/deviations: Step-to pattern,Decreased stride length General Gait Details: small lateral steps to R, increased RUE support with stance on LLE to advance RLE. small steps with increased difficulty coordinating placement of LLE Gait velocity: decreased Gait velocity interpretation: <1.31 ft/sec, indicative of household ambulator    Posture / Balance Dynamic Sitting Balance Sitting balance - Comments: Able to sit unsupported with supervision, left lateral lean with challenge Balance Overall balance assessment: Needs assistance Sitting-balance support: Feet supported Sitting balance-Leahy Scale:  Fair Sitting balance - Comments: Able to sit unsupported with supervision, left lateral lean with challenge Postural control: Left lateral lean Standing balance support: Single extremity supported Standing balance-Leahy Scale: Poor Standing balance comment: left lateral lean, minA of 2 for dynamic stance, minA of 1 for static stance    Special needs/care consideration Continuous Drip IV  0.9% NS at 125 ml/hr, Skin Right groin dressing over puncture site and Diabetic management Currently on lantus and novolog insulin in hospital for DM     Previous Home Environment (from acute therapy documentation) Living Arrangements: Spouse/significant other  Lives With: Spouse Available Help at Discharge: Family,Available 24 hours/day Type of Home: House Home Layout: One level Home Access: Ramped entrance Bathroom Shower/Tub: Walk-in shower  Discharge Living Setting Plans for Discharge Living Setting: Patient's home,House,Lives with (comment) (Lives with wife.) Type of Home at Discharge: House Discharge Home Layout: One level Discharge Home Access: Ramped entrance Discharge Bathroom Shower/Tub: Walk-in shower,Door Discharge Bathroom Toilet: Standard Discharge Bathroom Accessibility: Yes How Accessible: Accessible via walker  Social/Family/Support Systems Patient Roles: Spouse,Parent (Has a wife and a son.) Contact Information: Talha Iser - wife Anticipated Caregiver: Wife Ability/Limitations of Caregiver: Wife is retired and can provide supervision to light assistance Caregiver Availability: 24/7 Discharge Plan Discussed with Primary Caregiver: Yes Is Caregiver In Agreement with Plan?: Yes Does Caregiver/Family have Issues with Lodging/Transportation while Pt is in Rehab?: No  Goals Patient/Family Goal for Rehab: PT S, OT S/min, SLP mod I/S goals Expected length of stay: 14-20 days Cultural Considerations: none Pt/Family Agrees to Admission and willing to participate:  Yes Program Orientation Provided & Reviewed with Pt/Caregiver Including Roles  & Responsibilities: Yes  Decrease burden of Care through IP rehab admission: N/A  Possible need for SNF placement upon discharge: Yes, if patient does not progress to point where wife and family can manage care at home after CIR stay.  Patient Condition: This patient's condition remains as documented in the consult dated 05/13/20, in which the Rehabilitation Physician determined and documented that the patient's condition  is appropriate for intensive rehabilitative care in an inpatient rehabilitation facility. Will admit to inpatient rehab today.  Preadmission Screen Completed By:  Thomas Diones, RN, 05/14/2020 10:26 AM ______________________________________________________________________   Discussed status with Dr. Naaman Plummer on 05/14/20 at 0930 and received approval for admission today.  Admission Coordinator:  Thomas Sampson, time 1027 Sudie Grumbling 05/14/20           Cosigned by: Meredith Staggers, MD at 05/14/2020 10:31 AM    Revision History                                  Note Details  Author Thomas Diones, RN File Time 05/14/2020 10:28 AM  Author Type Rehab Admission Coordinator Status Signed  Last Editor Rithy Mandley, Evalee Mutton, RN Service Nursing

## 2020-05-14 NOTE — Progress Notes (Signed)
Inpatient Diabetes Program Recommendations  AACE/ADA: New Consensus Statement on Inpatient Glycemic Control   Target Ranges:  Prepandial:   less than 140 mg/dL      Peak postprandial:   less than 180 mg/dL (1-2 hours)      Critically ill patients:  140 - 180 mg/dL   Results for Thomas Sampson, Thomas Sampson (MRN [id number]) as of 05/14/2020 08:54  Ref. Range 05/13/2020 07:31 05/13/2020 11:45 05/13/2020 16:33 05/13/2020 20:14 05/13/2020 23:52 05/14/2020 03:25 05/14/2020 08:14  Glucose-Capillary Latest Ref Range: 70 - 99 mg/dL 176 (H) 299 (H) 286 (H) 228 (H) 144 (H) 194 (H) 255 (H)   Review of Glycemic Control  Diabetes history:DM2 Outpatient Diabetes medications:Glipizide XL 10 mg QHS, 70/30 42 units QAM, 70/30 52 units QPM, Novolog 26 units with breakfast, 26 units with lunch, and 22 units with supper Current orders for Inpatient glycemic control:Lantus 35 units daily, Novolog 0-20 units TID with meals, Novolog 8 units TID with meals  Inpatient Diabetes Program Recommendations:  Insulin:  Please consider increasing Lantus to 42 units daily, meal coverage to Novolog 12 units TID with meals if patient eats at least 50% of meals, and Novolog 0-5 units QHS.  NOTE: In reviewing chart, noted patient sees Dr. Gabriel Carina (Endocrinology) and was last seen on 05/07/20. Per office note on 05/07/20, patient was told to increase 70/30 to 42 units QAM and 52 units QPM. Once patient uses up 70/30 on hand, he was asked to start Tresiba 54 units daily and Novolog 20-35 units BID with meals (dose based on glucose).   Thanks, Barnie Alderman, RN, MSN, CDE Diabetes Coordinator Inpatient Diabetes Program [phone] (Team Pager from 8am to 5pm)

## 2020-05-14 NOTE — Discharge Instructions (Signed)
Wound care instructions (heart monitor site/chest) Keep incision clean and dry for 3 days.  You can remove outer dressing tomorrow. Leave steri-strips (little pieces of tape) on until seen in the office for wound check appointment. Call the office [phone]) for redness, drainage, swelling, or fever.   Femoral Site Care This sheet gives you information about how to care for yourself after your procedure. Your health care provider may also give you more specific instructions. If you have problems or questions, contact your health care provider. What can I expect after the procedure? After the procedure, it is common to have:  Bruising that usually fades within 1-2 weeks.  Tenderness at the site. Follow these instructions at home: Wound care 1. Follow instructions from your health care provider about how to take care of your insertion site. Make sure you: ? Wash your hands with soap and water before you change your bandage (dressing). If soap and water are not available, use hand sanitizer. ? Change your dressing as directed- pressure dressing removed 24 hours post-procedure (and switch for bandaid), bandaid removed 72 hours post-procedure 2. Do not take baths, swim, or use a hot tub for 7 days post-procedure. 3. You may shower 48 hours after the procedure or as told by your health care provider. ? Gently wash the site with plain soap and water. ? Pat the area dry with a clean towel. ? Do not rub the site. This may cause bleeding. 4. Check your site every day for signs of infection. Check for: ? Redness, swelling, or pain. ? Fluid or blood. ? Warmth. ? Pus or a bad smell. Activity  Do not stoop, bend, or lift anything that is heavier than 10 lb (4.5 kg) for 2 weeks post-procedure.  Do not drive self for 2 weeks post-procedure. Contact a health care provider if you have:  A fever or chills.  You have redness, swelling, or pain around your insertion site. Get help right away  if:  The catheter insertion area swells very fast.  You pass out.  You suddenly start to sweat or your skin gets clammy.  The catheter insertion area is bleeding, and the bleeding does not stop when you hold steady pressure on the area.  The area near or just beyond the catheter insertion site becomes pale, cool, tingly, or numb. These symptoms may represent a serious problem that is an emergency. Do not wait to see if the symptoms will go away. Get medical help right away. Call your local emergency services (911 in the [place] Do not drive yourself to the hospital.  This information is not intended to replace advice given to you by your health care provider. Make sure you discuss any questions you have with your health care provider. Document Revised: 01/29/2017 Document Reviewed: 01/29/2017 Elsevier Patient Education  2020 [employer].

## 2020-05-14 NOTE — Progress Notes (Signed)
Inpatient Rehabilitation Medication Review by a Pharmacist  A complete drug regimen review was completed for this patient to identify any potential clinically significant medication issues.  Clinically significant medication issues were identified:  no    Pharmacist comments:   Time spent performing this drug regimen review (minutes):  5   Gerldine Suleiman A. Levada Dy, PharmD, BCPS, FNKF Clinical Pharmacist [place] Please utilize Amion for appropriate phone number to reach the unit pharmacist ([place])  05/14/2020 7:09 PM

## 2020-05-14 NOTE — Care Management Important Message (Signed)
Important Message  Patient Details  Name: Thomas Sampson MRN: [id number] Date of Birth: 1945-08-21   Medicare Important Message Given:  Yes - Important Message mailed due to current National Emergency   Verbal consent obtained due to current National Emergency  Relationship to patient: Spouse/Significant Other Contact Name: Hassan Rowan Call Date: 05/14/20  Time: 1143 Phone: [phone] Outcome: Spoke with contact Important Message mailed to: Patient address on file    Delorse Lek 05/14/2020, 11:43 AM

## 2020-05-14 NOTE — Progress Notes (Signed)
Meredith Staggers, MD  Physician  Physical Medicine and Rehabilitation  Consult Note    Signed  Date of Service:  05/13/2020 5:28 AM      Related encounter: ED to Hosp-Admission (Current) from 05/10/2020 in [place] [facility]       Signed      Expand All Collapse All     Show:Clear all [x] Manual[x] Template[] Copied  Added by: [x] Angiulli, Lavon Paganini, PA-C[x] Meredith Staggers, MD   [] Hover for details       Physical Medicine and Rehabilitation Consult Reason for Consult: Left side weakness and slurred speech Referring Physician: Dr. Leonie Man   HPI: Thomas Sampson is a 75 y.o. right-handed male with history of chronic kidney disease stage IV, diabetes mellitus, hypertension, hyperlipidemia, tobacco abuse.  Per chart review patient lives with spouse.  Reportedly independent prior to admission retired.  1 level home with ramped entrance.  Presented 05/10/2020 to [facility] with left-sided weakness and slurred speech of acute onset.  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  No significant carotid or vertebral artery stenosis in the neck.  Admission chemistries unremarkable except glucose 207, BUN 31, creatinine 2.26, hemoglobin A1c 8.1.  Echocardiogram with ejection fraction of 70 to 75% no wall motion abnormalities.  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  Lower extremity Dopplers negative for DVT.  Initially maintained on Cleviprex for blood pressure control.  Neurology follow-up currently maintained on aspirin 81 mg plus Plavix for 3 months then Plavix alone.  Dysphagia #1 nectar thick liquid diet.  Therapy evaluations completed due to patient's left-sided weakness and slurred speech recommendations of physical medicine rehab  consult.   Review of Systems  Constitutional: Negative for chills and fever.  HENT: Negative for hearing loss.   Eyes: Negative for blurred vision and double vision.  Respiratory: Negative for cough and shortness of breath.   Cardiovascular: Negative for chest pain, palpitations and leg swelling.  Gastrointestinal: Positive for constipation. Negative for heartburn and nausea.  Genitourinary: Negative for dysuria, flank pain and hematuria.  Musculoskeletal: Positive for joint pain and myalgias.  Skin: Negative for rash.  Neurological: Positive for speech change and weakness.  All other systems reviewed and are negative.      Past Medical History:  Diagnosis Date  . Chronic kidney disease    kidney stones  . Diabetes mellitus without complication ([place])   . Hyperlipidemia   . Hypertension   . Tinea pedis         Past Surgical History:  Procedure Laterality Date  . COLON SURGERY    . COLONOSCOPY WITH PROPOFOL N/A 09/18/2016   Procedure: COLONOSCOPY WITH PROPOFOL;  Surgeon: Lollie Sails, MD;  Location: [facility] ENDOSCOPY;  Service: Endoscopy;  Laterality: N/A;  . EYE SURGERY     cataract extraction  . IR CT HEAD LTD  05/10/2020  . IR PERCUTANEOUS ART THROMBECTOMY/INFUSION INTRACRANIAL INC DIAG ANGIO  05/10/2020  . RADIOLOGY WITH ANESTHESIA N/A 05/10/2020   Procedure: IR WITH ANESTHESIA;  Surgeon: Radiologist, Medication, MD;  Location: [place];  Service: Radiology;  Laterality: N/A;        Family History  Problem Relation Age of Onset  . Cancer Mother   . Heart attack Father   . Heart attack Sister   . Heart disease Sister   .  Diabetes Brother    Social History:  reports that he has quit smoking. His smoking use included cigarettes. He quit after 40.00 years of use. He has never used smokeless tobacco. He reports that he does not drink alcohol and does not use drugs. Allergies:      Allergies  Allergen Reactions  . Demerol [Meperidine]           Medications Prior to Admission  Medication Sig Dispense Refill  . Alpha-Lipoic Acid 600 MG TABS Take by mouth.    Marland Kitchen amLODipine (NORVASC) 2.5 MG tablet Take 2.5 mg by mouth daily.    Marland Kitchen aspirin EC 81 MG tablet Take by mouth.    . calcitRIOL (ROCALTROL) 0.25 MCG capsule Take 0.25 mcg by mouth daily.    . Cholecalciferol 2000 units CAPS Take 2,000 Units by mouth daily.    . cholestyramine (QUESTRAN) 4 GM/DOSE powder Take by mouth 2 (two) times daily with a meal. (Patient not taking: No sig reported)    . colestipol (COLESTID) 1 g tablet Take 1 g by mouth 2 (two) times daily.    . cyanocobalamin (,VITAMIN B-12,) 1000 MCG/ML injection Inject 74ml IM once very 4 weeks then once a month for 4 months. (Patient not taking: No sig reported)    . cyanocobalamin 1000 MCG tablet Take 1,000 mcg by mouth daily.    Marland Kitchen gabapentin (NEURONTIN) 300 MG capsule Take by mouth 3 (three) times daily.     Marland Kitchen glipiZIDE (GLUCOTROL XL) 10 MG 24 hr tablet Take 10 mg by mouth at bedtime.    Marland Kitchen glucose blood (ONETOUCH ULTRA) test strip Use 2 (two) times daily Dx E11.9    . hydrochlorothiazide (HYDRODIURIL) 12.5 MG tablet Take 12.5 mg by mouth daily.    . insulin aspart (NOVOLOG) 100 UNIT/ML injection Inject 26 Units into the skin AC breakfast. take 26 units before breakfast and 22 units before supper (Patient not taking: No sig reported)    . insulin aspart protamine - aspart (NOVOLOG 70/30 MIX) (70-30) 100 UNIT/ML FlexPen 42-52 Units. 42 units qam and 52 qhs    . Insulin Pen Needle (BD PEN NEEDLE NANO U/F) 32G X 4 MM MISC USE WITH INJECTIONS 2 TIMES DAILY    . lisinopril (PRINIVIL,ZESTRIL) 40 MG tablet Take 40 mg by mouth daily. Take 20 mg twice a day    . metFORMIN (GLUCOPHAGE-XR) 500 MG 24 hr tablet Take 1,000 mg by mouth 2 (two) times daily. (Patient not taking: No sig reported)    . metoprolol succinate (TOPROL-XL) 100 MG 24 hr tablet Take 100 mg by mouth daily. Take with or immediately  following a meal.    . pantoprazole (PROTONIX) 40 MG tablet Take 40 mg by mouth daily.    . pregabalin (LYRICA) 75 MG capsule Take 75 mg by mouth 2 (two) times daily.      Home: Home Living Family/patient expects to be discharged to:: Private residence Living Arrangements: Spouse/significant other Available Help at Discharge: Family,Available 24 hours/day Type of Home: House Home Access: [place]: One level Bathroom Shower/Tub: [place]: [place] - built in,[place] - 2 wheels  Functional History: Prior Function Level of Independence: Independent Comments: Retired, used to work in [employer], occupational for [place]:  Mobility: [place] bed mobility: Needs Assistance Bed Mobility: Supine to Sit Supine to sit: Mod assist General bed mobility comments: Cues for initiating BLE's off edge of bed, modA for trunk to upright and guarding  LUE Transfers Overall transfer level: Needs assistance Equipment used: 1 person hand held assist Transfers: Sit to/from Stand Sit to Stand: Mod assist,+2 physical assistance General transfer comment: ModA + 2 to stand from edge of bed. Pt demonstrating increased L knee flexion, but no buckle, increased trunk/hip flexion, and left lateral lean. Able to correct with multimodal cues, but not maintain.  ADL: ADL Overall ADL's : Needs assistance/impaired Eating/Feeding: Set up,Sitting,Bed level Grooming: Wash/dry hands,Wash/dry face,Oral care,Brushing hair,Minimal assistance,Sitting Grooming Details (indicate cue type and reason): sitting EOB Upper Body Bathing: Moderate assistance,Sitting Lower Body Bathing: Maximal assistance,Sit to/from stand Upper Body Dressing : Moderate assistance,Sitting Lower Body Dressing: Maximal assistance,Sit to/from stand Lower Body Dressing Details (indicate cue type and reason): Pt able to don socks with mod A Toilet Transfer: Maximal  assistance,+2 for physical assistance,+2 for safety/equipment,BSC Toileting- Clothing Manipulation and Hygiene: Maximal assistance,Sit to/from stand Functional mobility during ADLs: Moderate assistance,+2 for physical assistance,+2 for safety/equipment General ADL Comments: Pt limited by Lt hemiparesis and Lt inattention as well as visual field deficit  Cognition: Cognition Overall Cognitive Status: Impaired/Different from baseline Orientation Level: Oriented X4 Cognition Arousal/Alertness: Awake/alert Behavior During Therapy: WFL for tasks assessed/performed Overall Cognitive Status: Impaired/Different from baseline Area of Impairment: Following commands,Safety/judgement,Awareness,Problem solving Following Commands: Follows one step commands consistently,Follows multi-step commands inconsistently Safety/Judgement: Decreased awareness of safety,Decreased awareness of deficits Awareness: Emergent Problem Solving: Requires verbal cues General Comments: Pt alert, joking with family and therapists. Decreased insight into safety/deficits, requiring cues for attending and locating objects on the left side. Able to correct left lateral lean with multimodal cues, but not maintain. Pt stating he is not tired during session, but then falling asleep quickly after completing.  Blood pressure 125/73, pulse 65, temperature 99.3 F (37.4 C), temperature source Oral, resp. rate (!) 21, height 5\' 9"  (1.753 m), SpO2 98 %. Physical Exam Constitutional:      General: He is not in acute distress.    Appearance: He is obese.  HENT:     Head: Normocephalic and atraumatic.     Nose: Nose normal.     Mouth/Throat:     Mouth: Mucous membranes are moist.     Comments: Thrush on tongue Eyes:     Extraocular Movements: Extraocular movements intact.     Conjunctiva/sclera: Conjunctivae normal.     Pupils: Pupils are equal, round, and reactive to light.  Cardiovascular:     Rate and Rhythm: Normal rate.      Pulses: Normal pulses.  Pulmonary:     Effort: Pulmonary effort is normal.  Abdominal:     Palpations: Abdomen is soft.  Musculoskeletal:        General: Normal range of motion.  Skin:    General: Skin is warm.  Neurological:     Comments: Dysarthric speech. Mild left inattention. Left central 7 and tongue deviation. RUE and RLE 4-5/5. LUE tr-1/5 deltoid, pecs, tr biceps, 0/5 elsewhere. LLE: 2+ to 3-/5 prox to distal. Senses pain and light touch although fine touch discrimination diminished on soles of both feet.   Psychiatric:     Comments: Overall pleasant, frequently displayed pseudobulbar affect     Lab Results Last 24 Hours       Results for orders placed or performed during the hospital encounter of 05/10/20 (from the past 24 hour(s))  Glucose, capillary     Status: Abnormal   Collection Time: 05/12/20  7:30 AM  Result Value Ref Range   Glucose-Capillary 240 (H) 70 - 99 mg/dL  Glucose, capillary     Status: Abnormal   Collection Time: 05/12/20 11:59 AM  Result Value Ref Range   Glucose-Capillary 232 (H) 70 - 99 mg/dL  Glucose, capillary     Status: Abnormal   Collection Time: 05/12/20  3:17 PM  Result Value Ref Range   Glucose-Capillary 141 (H) 70 - 99 mg/dL  Glucose, capillary     Status: Abnormal   Collection Time: 05/12/20  7:34 PM  Result Value Ref Range   Glucose-Capillary 239 (H) 70 - 99 mg/dL  Glucose, capillary     Status: Abnormal   Collection Time: 05/12/20 11:23 PM  Result Value Ref Range   Glucose-Capillary 242 (H) 70 - 99 mg/dL  CBC     Status: Abnormal   Collection Time: 05/13/20  2:29 AM  Result Value Ref Range   WBC 11.9 (H) 4.0 - 10.5 K/uL   RBC 3.62 (L) 4.22 - 5.81 MIL/uL   Hemoglobin 11.6 (L) 13.0 - 17.0 g/dL   HCT 36.0 (L) 39.0 - 52.0 %   MCV 99.4 80.0 - 100.0 fL   MCH 32.0 26.0 - 34.0 pg   MCHC 32.2 30.0 - 36.0 g/dL   RDW 13.5 11.5 - 15.5 %   Platelets 234 150 - 400 K/uL   nRBC 0.0 0.0 - 0.2 %  Basic metabolic  panel     Status: Abnormal   Collection Time: 05/13/20  2:29 AM  Result Value Ref Range   Sodium 143 135 - 145 mmol/L   Potassium 3.8 3.5 - 5.1 mmol/L   Chloride 109 98 - 111 mmol/L   CO2 27 22 - 32 mmol/L   Glucose, Bld 223 (H) 70 - 99 mg/dL   BUN 24 (H) 8 - 23 mg/dL   Creatinine, Ser 2.02 (H) 0.61 - 1.24 mg/dL   Calcium 8.7 (L) 8.9 - 10.3 mg/dL   GFR, Estimated 34 (L) >60 mL/min   Anion gap 7 5 - 15  Glucose, capillary     Status: Abnormal   Collection Time: 05/13/20  3:15 AM  Result Value Ref Range   Glucose-Capillary 219 (H) 70 - 99 mg/dL      Imaging Results (Last 48 hours)  MR ANGIO HEAD WO CONTRAST  Result Date: 05/11/2020 CLINICAL DATA:  Stroke, follow-up. EXAM: MRI HEAD WITHOUT CONTRAST MRA HEAD WITHOUT CONTRAST TECHNIQUE: Multiplanar, multiecho pulse sequences of the brain and surrounding structures were obtained without intravenous contrast. Angiographic images of the head were obtained using MRA technique without contrast. COMPARISON:  Head CT 05/11/2020. Images from thrombectomy 05/10/2020. Noncontrast head CT, CT angiogram head/neck and CT perfusion 05/10/2020. MRI/MRA head 05/10/2020. Small chronic infarct within the right cerebellar hemisphere. FINDINGS: MRI HEAD FINDINGS Brain: Mild-to-moderate cerebral atrophy. Comparatively mild cerebellar atrophy. Acute cortical/subcortical infarct within the right MCA vascular territory. The dominant component of the infarct is present within the right frontal operculum and right insula and now measures 4.7 x 3.9 x 4.2 cm. However, there are additional cortical/subcortical acute infarcts more superiorly and posteriorly within the right frontal lobe and right parietal lobe (with involvement of the pre and postcentral gyri). Additional small acute infarcts are also present within the right caudate nucleus and right frontal lobe more anteriorly. SWI signal loss along the right sylvian fissure, likely reflecting a combination of  petechial hemorrhage and the previously demonstrated small-volume acute subarachnoid hemorrhage at this site. Additional petechial hemorrhage within the right caudate nucleus infarct. Additional petechial hemorrhage versus chronic microhemorrhages within the right frontal operculum and right  frontal lobe white matter. There is no significant mass effect at this time. No midline shift. Small acute cortically based infarct within the right occipital lobe (PCA vascular territory). Punctate acute infarct within the left occipital lobe (PCA vascular territory) (series 5, image 70). Background mild multifocal T2/FLAIR hyperintensity within the cerebral white matter is nonspecific, but compatible with chronic small vessel ischemic disease. Redemonstrated small chronic infarct within the right cerebellar hemisphere. No evidence of intracranial mass. No extra-axial fluid collection. Vascular: Expected proximal arterial flow voids. Skull and upper cervical spine: No focal marrow lesion. Sinuses/Orbits: Visualized orbits show no acute finding. Trace scattered paranasal sinus mucosal thickening at the imaged levels. MRA HEAD FINDINGS: The intracranial internal carotid arteries are patent. The M1 middle cerebral arteries are patent. High-grade focal stenosis with possible short-segment flow gap at site of previously demonstrated mid to distal right M2 MCA branch occlusion (series 10, images 125-134) (series 1081, image 3). The anterior cerebral arteries are patent. The intracranial vertebral arteries are patent. The basilar artery is patent. The posterior cerebral arteries are patent. A right posterior communicating artery is present. The left posterior communicating artery is hypoplastic or absent. No intracranial aneurysm is identified. IMPRESSION: MRI brain: 1. Acute right MCA vascular territory infarct as described. This has significantly increased in extent as compared to the brain MRI of 05/10/2020, and may have subtly  increased in extent as compared to the head CT performed earlier today. No significant mass effect at this time 2. SWI signal loss along the right sylvian fissure which likely reflects a combination of petechial hemorrhage and the small-volume acute subarachnoid hemorrhage previously demonstrated at this site. 3. Small acute cortically-based infarct within the right occipital lobe (PCA vascular territory). 4. Punctate acute cortical infarct within the left occipital lobe (PCA vascular territory). 5. Background chronic findings without interval change, as described. MRA head: 1. High-grade focal stenosis with possible short-segment flow gap at site of previously demonstrated mid-to-distal right M2 MCA branch occlusion. 2. Elsewhere, no intracranial large vessel occlusion or proximal high-grade arterial stenosis is identified. Electronically Signed   By: Kellie Simmering DO   On: 05/11/2020 18:10   MR BRAIN WO CONTRAST  Result Date: 05/11/2020 CLINICAL DATA:  Stroke, follow-up. EXAM: MRI HEAD WITHOUT CONTRAST MRA HEAD WITHOUT CONTRAST TECHNIQUE: Multiplanar, multiecho pulse sequences of the brain and surrounding structures were obtained without intravenous contrast. Angiographic images of the head were obtained using MRA technique without contrast. COMPARISON:  Head CT 05/11/2020. Images from thrombectomy 05/10/2020. Noncontrast head CT, CT angiogram head/neck and CT perfusion 05/10/2020. MRI/MRA head 05/10/2020. Small chronic infarct within the right cerebellar hemisphere. FINDINGS: MRI HEAD FINDINGS Brain: Mild-to-moderate cerebral atrophy. Comparatively mild cerebellar atrophy. Acute cortical/subcortical infarct within the right MCA vascular territory. The dominant component of the infarct is present within the right frontal operculum and right insula and now measures 4.7 x 3.9 x 4.2 cm. However, there are additional cortical/subcortical acute infarcts more superiorly and posteriorly within the right frontal lobe  and right parietal lobe (with involvement of the pre and postcentral gyri). Additional small acute infarcts are also present within the right caudate nucleus and right frontal lobe more anteriorly. SWI signal loss along the right sylvian fissure, likely reflecting a combination of petechial hemorrhage and the previously demonstrated small-volume acute subarachnoid hemorrhage at this site. Additional petechial hemorrhage within the right caudate nucleus infarct. Additional petechial hemorrhage versus chronic microhemorrhages within the right frontal operculum and right frontal lobe white matter. There is no significant mass effect  at this time. No midline shift. Small acute cortically based infarct within the right occipital lobe (PCA vascular territory). Punctate acute infarct within the left occipital lobe (PCA vascular territory) (series 5, image 70). Background mild multifocal T2/FLAIR hyperintensity within the cerebral white matter is nonspecific, but compatible with chronic small vessel ischemic disease. Redemonstrated small chronic infarct within the right cerebellar hemisphere. No evidence of intracranial mass. No extra-axial fluid collection. Vascular: Expected proximal arterial flow voids. Skull and upper cervical spine: No focal marrow lesion. Sinuses/Orbits: Visualized orbits show no acute finding. Trace scattered paranasal sinus mucosal thickening at the imaged levels. MRA HEAD FINDINGS: The intracranial internal carotid arteries are patent. The M1 middle cerebral arteries are patent. High-grade focal stenosis with possible short-segment flow gap at site of previously demonstrated mid to distal right M2 MCA branch occlusion (series 10, images 125-134) (series 1081, image 3). The anterior cerebral arteries are patent. The intracranial vertebral arteries are patent. The basilar artery is patent. The posterior cerebral arteries are patent. A right posterior communicating artery is present. The left  posterior communicating artery is hypoplastic or absent. No intracranial aneurysm is identified. IMPRESSION: MRI brain: 1. Acute right MCA vascular territory infarct as described. This has significantly increased in extent as compared to the brain MRI of 05/10/2020, and may have subtly increased in extent as compared to the head CT performed earlier today. No significant mass effect at this time 2. SWI signal loss along the right sylvian fissure which likely reflects a combination of petechial hemorrhage and the small-volume acute subarachnoid hemorrhage previously demonstrated at this site. 3. Small acute cortically-based infarct within the right occipital lobe (PCA vascular territory). 4. Punctate acute cortical infarct within the left occipital lobe (PCA vascular territory). 5. Background chronic findings without interval change, as described. MRA head: 1. High-grade focal stenosis with possible short-segment flow gap at site of previously demonstrated mid-to-distal right M2 MCA branch occlusion. 2. Elsewhere, no intracranial large vessel occlusion or proximal high-grade arterial stenosis is identified. Electronically Signed   By: Kellie Simmering DO   On: 05/11/2020 18:10   DG CHEST PORT 1 VIEW  Result Date: 05/12/2020 CLINICAL DATA:  Aspiration EXAM: PORTABLE CHEST 1 VIEW COMPARISON:  05/11/2020 FINDINGS: Nasogastric tube extends into the upper abdomen. Mild elevation of the right hemidiaphragm is unchanged with associated right basilar atelectasis. Lungs are otherwise clear. No pneumothorax or pleural effusion. Cardiac size is within normal limits. Apparent mediastinal widening is likely artifactual and related to semi-erect positioning. Pulmonary vascularity is normal. IMPRESSION: Stable mild elevation of the right hemidiaphragm with associated right basilar atelectasis. Electronically Signed   By: Fidela Salisbury MD   On: 05/12/2020 06:11   DG CHEST PORT 1 VIEW  Result Date: 05/11/2020 CLINICAL DATA:   Hypoxia EXAM: PORTABLE CHEST 1 VIEW COMPARISON:  05/10/2020 FINDINGS: Cardiac shadow is stable. Gastric catheter is noted in satisfactory position. Previously seen endotracheal tube is no longer identified. Bibasilar atelectatic changes are noted slightly increased when compared with the prior exam likely accentuated by poor inspiratory effort. No acute bony abnormality is seen. IMPRESSION: Increasing bibasilar atelectasis. Electronically Signed   By: Inez Catalina M.D.   On: 05/11/2020 12:48   DG Abd Portable 1V  Result Date: 05/11/2020 CLINICAL DATA:  Check gastric catheter placement EXAM: PORTABLE ABDOMEN - 1 VIEW COMPARISON:  None. FINDINGS: Gastric catheter is noted within the stomach. No obstructive changes are seen. No free air is noted. IMPRESSION: Gastric catheter within the stomach. Electronically Signed   By:  Inez Catalina M.D.   On: 05/11/2020 12:58   DG Swallowing Func-Speech Pathology  Result Date: 05/12/2020 Objective Swallowing Evaluation: Type of Study: MBS-Modified Barium Swallow Study  Patient Details Name: SHAWNN BOUILLON MRN: [id number] Date of Birth: 1946/01/09 Today's Date: 05/12/2020 Time: SLP Start Time (ACUTE ONLY): 1403 -SLP Stop Time (ACUTE ONLY): 1424 SLP Time Calculation (min) (ACUTE ONLY): 21 min Past Medical History: Past Medical History: Diagnosis Date . Chronic kidney disease   kidney stones . Diabetes mellitus without complication ([place])  . Hyperlipidemia  . Hypertension  . Tinea pedis  Past Surgical History: Past Surgical History: Procedure Laterality Date . COLON SURGERY   . COLONOSCOPY WITH PROPOFOL N/A 09/18/2016  Procedure: COLONOSCOPY WITH PROPOFOL;  Surgeon: Lollie Sails, MD;  Location[facility] ENDOSCOPY;  Service: Endoscopy;  Laterality: N/A; . EYE SURGERY    cataract extraction . IR CT HEAD LTD  05/10/2020 . IR PERCUTANEOUS ART THROMBECTOMY/INFUSION INTRACRANIAL INC DIAG ANGIO  05/10/2020 . RADIOLOGY WITH ANESTHESIA N/A 05/10/2020  Procedure: IR WITH ANESTHESIA;   Surgeon: Radiologist, Medication, MD;  Location: [place];  Service: Radiology;  Laterality: N/A; HPI: 75 year-old male [facility] to [facility] on 4/11 with new left-sided weakness and slurred speech. 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  On 4/11 he was emergently underwent revascularization of occluded superior division right MCA through mechanical thrombectomy.  ETT 4/11-/12.  No data recorded Assessment / Plan / Recommendation CHL IP CLINICAL IMPRESSIONS 05/12/2020 Clinical Impression Oropharyngeal dysphagia exhibited by lingual pumping, decreased cohesion, delayed transit and mistimed sequlae of swallow. Delayed and effortful bolus propulsion with bolus on posterior base of tongue while pt attempting to retract tongue and initiate swallow. There was left anterior spill and stasis with thinner consistencies. Thin barium penetrated vestibule and was aspirated into before protective mechanisms were initiated followed by reflexive cough. A chin tuck did not eliminate or reduce aspiration which occured silently. Nectar thick boluses consistently reached his pyriform sinuses for 4-5 seconds prior to onset. Minimal valleculuar residue. Challenged with nectar with larger sips via straw filling pyriform sinuses which did not enter vestibule. Recommend pt initiate Dys 1 (pureee), nectar, crush pills, check left side oral cavity for pocketing and full assist for strategies. SLP Visit Diagnosis Dysphagia, oropharyngeal phase (R13.12) Attention and concentration deficit following -- Frontal lobe and executive function deficit following -- Impact on safety and function Moderate aspiration risk   [facility] IP TREATMENT RECOMMENDATION 05/12/2020 Treatment Recommendations Therapy as outlined in treatment plan below   Prognosis 05/12/2020 Prognosis for Safe Diet Advancement Good Barriers to Reach Goals --  Barriers/Prognosis Comment -- [facility] IP DIET RECOMMENDATION 05/12/2020 SLP Diet Recommendations Dysphagia 1 (Puree) solids;Nectar thick liquid Liquid Administration via Cup;Straw Medication Administration Crushed with puree Compensations Minimize environmental distractions;Slow rate;Small sips/bites;Lingual sweep for clearance of pocketing Postural Changes Seated upright at 90 degrees   [facility] IP OTHER RECOMMENDATIONS 05/12/2020 Recommended Consults -- Oral Care Recommendations Oral care BID Other Recommendations Order thickener from pharmacy   [facility] IP FOLLOW UP RECOMMENDATIONS 05/12/2020 Follow up Recommendations Inpatient Rehab   [facility] IP FREQUENCY AND DURATION 05/12/2020 Speech Therapy Frequency (ACUTE ONLY) min 2x/week Treatment Duration 2 weeks      [facility] IP ORAL PHASE 05/12/2020 Oral Phase Impaired Oral - Pudding Teaspoon -- Oral - Pudding Cup -- Oral - Honey Teaspoon -- Oral - Honey Cup -- Oral - Nectar Teaspoon -- Oral - Nectar Cup  Lingual pumping;Delayed oral transit;Weak lingual manipulation;Reduced posterior propulsion Oral - Nectar Straw -- Oral - Thin Teaspoon -- Oral - Thin Cup Left anterior bolus loss;Decreased bolus cohesion;Left pocketing in lateral sulci Oral - Thin Straw -- Oral - Puree WFL Oral - Mech Soft -- Oral - Regular Delayed oral transit;Weak lingual manipulation;Impaired mastication Oral - Multi-Consistency -- Oral - Pill -- Oral Phase - Comment --  [facility] IP PHARYNGEAL PHASE 05/12/2020 Pharyngeal Phase Impaired Pharyngeal- Pudding Teaspoon -- Pharyngeal -- Pharyngeal- Pudding Cup -- Pharyngeal -- Pharyngeal- Honey Teaspoon -- Pharyngeal -- Pharyngeal- Honey Cup -- Pharyngeal -- Pharyngeal- Nectar Teaspoon -- Pharyngeal -- Pharyngeal- Nectar Cup Delayed swallow initiation-pyriform sinuses;Delayed swallow initiation-vallecula;Pharyngeal residue - valleculae Pharyngeal -- Pharyngeal- Nectar Straw -- Pharyngeal -- Pharyngeal- Thin Teaspoon -- Pharyngeal -- Pharyngeal- Thin Cup Penetration/Aspiration before  swallow;Compensatory strategies attempted (with notebox) Pharyngeal Material enters airway, passes BELOW cords without attempt by patient to eject out (silent aspiration);Material enters airway, passes BELOW cords and not ejected out despite cough attempt by patient Pharyngeal- Thin Straw -- Pharyngeal -- Pharyngeal- Puree WFL Pharyngeal -- Pharyngeal- Mechanical Soft -- Pharyngeal -- Pharyngeal- Regular WFL Pharyngeal -- Pharyngeal- Multi-consistency -- Pharyngeal -- Pharyngeal- Pill -- Pharyngeal -- Pharyngeal Comment --  [facility] IP CERVICAL ESOPHAGEAL PHASE 05/12/2020 Cervical Esophageal Phase WFL Pudding Teaspoon -- Pudding Cup -- Honey Teaspoon -- Honey Cup -- Nectar Teaspoon -- Nectar Cup -- Nectar Straw -- Thin Teaspoon -- Thin Cup -- Thin Straw -- Puree -- Mechanical Soft -- Regular -- Multi-consistency -- Pill -- Cervical Esophageal Comment -- [place] Siren 05/12/2020, 4:48 PM  Orbie Pyo Litaker M.Ed Actor Pager [phone] Office [phone]             ECHOCARDIOGRAM COMPLETE  Result Date: 05/12/2020    ECHOCARDIOGRAM REPORT   Patient Name:   ZAKARY KIMURA Date of Exam: 05/12/2020 Medical Rec #:  [id number]       Height:       69.0 in Accession #:    [id number]      Weight:       220.2 lb Date of Birth:  20-May-1945      BSA:          2.152 m Patient Age:    73 years        BP:           106/56 mmHg Patient Gender: M               HR:           74 bpm. Exam Location:  Inpatient Procedure: 2D Echo, Cardiac Doppler and Color Doppler Indications:    CVA  History:        Patient has no prior history of Echocardiogram examinations.                 Signs/Symptoms:CKD; Risk Factors:Hypertension, Dyslipidemia,                 Diabetes and Former Smoker.  Sonographer:    Dustin Flock Referring Phys: [place]  1. Left ventricular ejection fraction, by estimation, is 70 to 75%. The left ventricle has hyperdynamic function. The left ventricle has no  regional wall motion abnormalities. There is mild concentric left ventricular hypertrophy. Left ventricular diastolic parameters were normal.  2. Right ventricular systolic function is normal. The right ventricular size is normal.  3. The mitral valve is normal in structure. Trivial mitral valve regurgitation.  4. The aortic valve is  tricuspid. There is mild thickening of the aortic valve. Aortic valve regurgitation is not visualized. No aortic stenosis is present.  5. Aortic dilatation noted. There is mild dilatation of the aortic root, measuring 40 mm. Comparison(s): No prior Echocardiogram. Conclusion(s)/Recommendation(s): No intracardiac source of embolism detected on this transthoracic study. A transesophageal echocardiogram is recommended to exclude cardiac source of embolism if clinically indicated. FINDINGS  Left Ventricle: Left ventricular ejection fraction, by estimation, is 70 to 75%. The left ventricle has hyperdynamic function. The left ventricle has no regional wall motion abnormalities. The left ventricular internal cavity size was normal in size. There is mild concentric left ventricular hypertrophy. Left ventricular diastolic parameters were normal. Right Ventricle: The right ventricular size is normal. Right vetricular wall thickness was not well visualized. Right ventricular systolic function is normal. Left Atrium: Left atrial size was normal in size. Right Atrium: Right atrial size was normal in size. Pericardium: There is no evidence of pericardial effusion. Mitral Valve: The mitral valve is normal in structure. Trivial mitral valve regurgitation. Tricuspid Valve: The tricuspid valve is normal in structure. Tricuspid valve regurgitation is trivial. Aortic Valve: The aortic valve is tricuspid. There is mild thickening of the aortic valve. Aortic valve regurgitation is not visualized. No aortic stenosis is present. Pulmonic Valve: The pulmonic valve was normal in structure. Pulmonic valve  regurgitation is trivial. Aorta: Aortic dilatation noted. There is mild dilatation of the aortic root, measuring 40 mm. IAS/Shunts: No atrial level shunt detected by color flow Doppler.  LEFT VENTRICLE PLAX 2D LVIDd:         4.25 cm  Diastology LVIDs:         2.00 cm  LV e' medial:    7.18 cm/s LV PW:         0.90 cm  LV E/e' medial:  10.3 LV IVS:        1.05 cm  LV e' lateral:   9.57 cm/s LVOT diam:     2.00 cm  LV E/e' lateral: 7.7 LV SV:         71 LV SV Index:   33 LVOT Area:     3.14 cm  RIGHT VENTRICLE RV Basal diam:  3.40 cm RV S prime:     17.90 cm/s TAPSE (M-mode): 3.3 cm LEFT ATRIUM             Index       RIGHT ATRIUM           Index LA diam:        3.50 cm 1.63 cm/m  RA Area:     16.00 cm LA Vol (A2C):   47.9 ml 22.25 ml/m RA Volume:   42.30 ml  19.65 ml/m LA Vol (A4C):   33.7 ml 15.66 ml/m LA Biplane Vol: 41.9 ml 19.47 ml/m  AORTIC VALVE LVOT Vmax:   127.00 cm/s LVOT Vmean:  82.500 cm/s LVOT VTI:    0.225 m  AORTA Ao Root diam: 3.50 cm MITRAL VALVE MV Area (PHT): 4.21 cm    SHUNTS MV Decel Time: 180 msec    Systemic VTI:  0.22 m MV E velocity: 74.10 cm/s  Systemic Diam: 2.00 cm MV A velocity: 75.00 cm/s MV E/A ratio:  0.99 Gwyndolyn Kaufman MD Electronically signed by Gwyndolyn Kaufman MD Signature Date/Time: 05/12/2020/12:59:11 PM    Final    VAS [place] LOWER EXTREMITY VENOUS (DVT)  Result Date: 05/12/2020  Lower Venous DVT Study Indications: Swelling and erythema LT>RT, s/p stroke.  Limitations: S/P  RT cath. Comparison Study: No prior studies. Performing Technologist: Darlin Coco RDMS,RVT  Examination Guidelines: A complete evaluation includes B-mode imaging, spectral Doppler, color Doppler, and power Doppler as needed of all accessible portions of each vessel. Bilateral testing is considered an integral part of a complete examination. Limited examinations for reoccurring indications may be performed as noted. The reflux portion of the exam is performed with the patient in reverse  Trendelenburg.  +---------+---------------+---------+-----------+----------+-------------------+ RIGHT    CompressibilityPhasicitySpontaneityPropertiesThrombus Aging      +---------+---------------+---------+-----------+----------+-------------------+ CFV      Full           Yes      Yes                                      +---------+---------------+---------+-----------+----------+-------------------+ SFJ                                                   Limited visibility                                                        due to recent cath  +---------+---------------+---------+-----------+----------+-------------------+ FV Prox  Full                                                             +---------+---------------+---------+-----------+----------+-------------------+ FV Mid   Full                                                             +---------+---------------+---------+-----------+----------+-------------------+ FV DistalFull                                                             +---------+---------------+---------+-----------+----------+-------------------+ PFV      Full                                                             +---------+---------------+---------+-----------+----------+-------------------+ POP      Full           Yes      Yes                                      +---------+---------------+---------+-----------+----------+-------------------+ PTV      Full                                                             +---------+---------------+---------+-----------+----------+-------------------+  PERO     Full                                                             +---------+---------------+---------+-----------+----------+-------------------+   +---------+---------------+---------+-----------+----------+--------------+ LEFT     CompressibilityPhasicitySpontaneityPropertiesThrombus  Aging +---------+---------------+---------+-----------+----------+--------------+ CFV      Full           Yes      Yes                                 +---------+---------------+---------+-----------+----------+--------------+ SFJ      Full                                                        +---------+---------------+---------+-----------+----------+--------------+ FV Prox  Full                                                        +---------+---------------+---------+-----------+----------+--------------+ FV Mid   Full                                                        +---------+---------------+---------+-----------+----------+--------------+ FV DistalFull                                                        +---------+---------------+---------+-----------+----------+--------------+ PFV      Full                                                        +---------+---------------+---------+-----------+----------+--------------+ POP      Full           Yes      Yes                                 +---------+---------------+---------+-----------+----------+--------------+ PTV      Full                                                        +---------+---------------+---------+-----------+----------+--------------+ PERO     Full                                                        +---------+---------------+---------+-----------+----------+--------------+  Summary: RIGHT: - There is no evidence of deep vein thrombosis in the lower extremity.  - No cystic structure found in the popliteal fossa.  LEFT: - There is no evidence of deep vein thrombosis in the lower extremity.  - No cystic structure found in the popliteal fossa.  *See table(s) above for measurements and observations. Electronically signed by Monica Martinez MD on 05/12/2020 at 2:16:36 PM.    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 Does the need for close, 24 hr/day medical supervision in concert with the patient's rehab needs make it unreasonable for this patient to be served in a less intensive setting? Yes 2. Co-Morbidities requiring supervision/potential complications: CKD, DM, HTN 3. Due to bladder management, bowel management, safety, skin/wound care, disease management, medication administration, pain management and patient education, does the patient require 24 hr/day rehab nursing? Yes 4. Does the patient require coordinated care of a physician, rehab nurse, therapy disciplines of PT, OT, SLP to address physical and functional deficits in the context of the above medical diagnosis(es)? Yes Addressing deficits in the following areas: balance, endurance, locomotion, strength, transferring, bowel/bladder control, bathing, dressing, feeding, grooming, toileting, cognition, speech and psychosocial support 5. Can the patient actively participate in an intensive therapy program of at least 3 hrs of therapy per day at least 5 days per week? Yes 6. The potential for patient to make measurable gains while on inpatient rehab is excellent 7. Anticipated functional outcomes upon discharge from inpatient rehab are supervision  with PT, supervision and min assist with OT, modified independent and supervision with SLP. 8. Estimated rehab length of stay to reach the above functional goals is: 14-20 days 9. Anticipated discharge destination: Home 10. Overall Rehab/Functional Prognosis: excellent  RECOMMENDATIONS: This patient's condition is appropriate for continued rehabilitative care in the following setting: CIR Patient has agreed to participate in recommended program. Yes Note that insurance prior authorization may be required for reimbursement for recommended care.  Comment: Rehab Admissions Coordinator to follow up.  Thanks,  Meredith Staggers, MD, Mellody Drown  I have  personally performed a face to face diagnostic evaluation of this patient. Additionally, I have examined pertinent labs and radiographic images. I have reviewed and concur with the physician assistant's documentation above.    Cathlyn Parsons, PA-C 05/13/2020          Revision History                        Routing History              Note Details  Author Meredith Staggers, MD File Time 05/13/2020 12:43 PM  Author Type Physician Status Signed  Last Editor Meredith Staggers, MD Service Physical Medicine and Rehabilitation

## 2020-05-14 NOTE — Progress Notes (Signed)
IP rehab admissions - I met with patient and his wife, brother yesterday.  All are agreeable to inpatient rehab admission.  Attending MD has cleared patient for CIR today.  Bed available and will admit to CIR today.  Call for questions.  [phone]

## 2020-05-14 NOTE — H&P (Addendum)
Physical Medicine and Rehabilitation Admission H&P     HPI: Thomas Sampson is a 75 year old right-handed male with history of chronic kidney disease stage IV, diabetes mellitus, hypertension, hyperlipidemia and tobacco abuse.  Per chart review patient lives with spouse independent prior to admission.  1 level home with ramped entrance.  Presented 05/10/2020 to [facility] with left-sided weakness and slurred speech of acute onset.  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  No significant carotid or vertebral artery stenosis of the neck.  Admission chemistries unremarkable except glucose 207, BUN 31, creatinine 2.26 hemoglobin A1c 8.1.  Echocardiogram with ejection fraction of 70 to 75% no wall motion abnormalities.  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  Lower extremity Dopplers negative for DVT.  Initially maintained on Cleviprex for blood pressure control.  Neurology follow-up currently maintained on aspirin 81 mg daily plus Plavix 75 mg daily for 3 months then Plavix alone.  Awaiting   loop recorder placement today..  Pt on dysphagia #1 nectar thick liquid diet.  Therapy evaluations completed due to patient's left-sided weakness and slurred speech. He was evaluated by the rehab team who felt that he would benefit from a comprehensive rehab program.  Review of Systems  Constitutional: Negative for chills and fever.  HENT: Negative for hearing loss.   Eyes: Negative for blurred vision and double vision.  Respiratory: Negative for cough and shortness of breath.   Cardiovascular: Negative for chest pain and leg swelling.  Gastrointestinal: Positive for constipation. Negative for heartburn, nausea and vomiting.  Genitourinary: Negative for dysuria, flank  pain and hematuria.  Musculoskeletal: Positive for joint pain and myalgias.  Skin: Negative for rash.  Neurological: Positive for speech change and weakness.  All other systems reviewed and are negative.  Past Medical History:  Diagnosis Date  . Chronic kidney disease    kidney stones  . Diabetes mellitus without complication ([place])   . Hyperlipidemia   . Hypertension   . Tinea pedis    Past Surgical History:  Procedure Laterality Date  . COLON SURGERY    . COLONOSCOPY WITH PROPOFOL N/A 09/18/2016   Procedure: COLONOSCOPY WITH PROPOFOL;  Surgeon: Lollie Sails, MD;  Location: [facility] ENDOSCOPY;  Service: Endoscopy;  Laterality: N/A;  . EYE SURGERY     cataract extraction  . IR CT HEAD LTD  05/10/2020  . IR PERCUTANEOUS ART THROMBECTOMY/INFUSION INTRACRANIAL INC DIAG ANGIO  05/10/2020  . RADIOLOGY WITH ANESTHESIA N/A 05/10/2020   Procedure: IR WITH ANESTHESIA;  Surgeon: Radiologist, Medication, MD;  Location: [facility];  Service: Radiology;  Laterality: N/A;   Family History  Problem Relation Age of Onset  . Cancer Mother   . Heart attack Father   . Heart attack Sister   . Heart disease Sister   . Diabetes Brother    Social History:  reports that he has quit smoking. His smoking use included cigarettes. He quit after 40.00 years of use. He has never used smokeless tobacco. He reports that he does not drink alcohol and does not use drugs. Allergies:  Allergies  Allergen Reactions  . Demerol [Meperidine]    Medications Prior to Admission  Medication Sig Dispense Refill  . Alpha-Lipoic Acid 600 MG TABS Take by mouth.    Marland Kitchen amLODipine (NORVASC) 2.5 MG tablet Take 2.5  mg by mouth daily.    Marland Kitchen aspirin EC 81 MG tablet Take by mouth.    . calcitRIOL (ROCALTROL) 0.25 MCG capsule Take 0.25 mcg by mouth daily.    . Cholecalciferol 2000 units CAPS Take 2,000 Units by mouth daily.    . cholestyramine (QUESTRAN) 4 GM/DOSE powder Take by mouth 2 (two) times daily with a meal. (Patient not  taking: No sig reported)    . colestipol (COLESTID) 1 g tablet Take 1 g by mouth 2 (two) times daily.    . cyanocobalamin (,VITAMIN B-12,) 1000 MCG/ML injection Inject 54ml IM once very 4 weeks then once a month for 4 months. (Patient not taking: No sig reported)    . cyanocobalamin 1000 MCG tablet Take 1,000 mcg by mouth daily.    Marland Kitchen gabapentin (NEURONTIN) 300 MG capsule Take by mouth 3 (three) times daily.     Marland Kitchen glipiZIDE (GLUCOTROL XL) 10 MG 24 hr tablet Take 10 mg by mouth at bedtime.    Marland Kitchen glucose blood (ONETOUCH ULTRA) test strip Use 2 (two) times daily Dx E11.9    . hydrochlorothiazide (HYDRODIURIL) 12.5 MG tablet Take 12.5 mg by mouth daily.    . insulin aspart (NOVOLOG) 100 UNIT/ML injection Inject 26 Units into the skin AC breakfast. take 26 units before breakfast and 22 units before supper (Patient not taking: No sig reported)    . insulin aspart protamine - aspart (NOVOLOG 70/30 MIX) (70-30) 100 UNIT/ML FlexPen 42-52 Units. 42 units qam and 52 qhs    . Insulin Pen Needle (BD PEN NEEDLE NANO U/F) 32G X 4 MM MISC USE WITH INJECTIONS 2 TIMES DAILY    . lisinopril (PRINIVIL,ZESTRIL) 40 MG tablet Take 40 mg by mouth daily. Take 20 mg twice a day    . metFORMIN (GLUCOPHAGE-XR) 500 MG 24 hr tablet Take 1,000 mg by mouth 2 (two) times daily. (Patient not taking: No sig reported)    . metoprolol succinate (TOPROL-XL) 100 MG 24 hr tablet Take 100 mg by mouth daily. Take with or immediately following a meal.    . pantoprazole (PROTONIX) 40 MG tablet Take 40 mg by mouth daily.    . pregabalin (LYRICA) 75 MG capsule Take 75 mg by mouth 2 (two) times daily.      Drug Regimen Review Drug regimen was reviewed and remains appropriate with no significant issues identified  Home: Home Living Family/patient expects to be discharged to:: Private residence Living Arrangements: Spouse/significant other Available Help at Discharge: Family,Available 24 hours/day Type of Home: House Home Access: Ramped  entrance Home Layout: One level Bathroom Shower/Tub: Walk-in shower Home Equipment: Shower seat - built in,Walker - 2 wheels  Lives With: Spouse   Functional History: Prior Function Level of Independence: Independent Comments: Retired, used to work in [employer], occupational for [place]:  Mobility: [place] bed mobility: Needs Assistance Bed Mobility: Supine to Sit Supine to sit: Min assist,HOB elevated General bed mobility comments: cues for moving BLE to EOB, needing minA to complete movement. minA to elevate trunk from elevated HOB. pt then able to steady in sitting EOB without assist Transfers Overall transfer level: Needs assistance Equipment used: 1 person hand held assist,2 person hand held assist Transfers: Sit to/from [place] to Stand: Min assist Stand pivot transfers: Min assist,+2 physical assistance General transfer comment: minA of 1 to power up from chair, pt with no noted buckling in LLE, able to maintain wt shift in midline. minA of  2 to take lateral steps and pivot to recliner. RUE support with stance on LLE Ambulation/Gait Ambulation/Gait assistance: Min assist,+2 physical assistance Gait Distance (Feet): 5 Feet Assistive device: 2 person hand held assist Gait Pattern/deviations: Step-to pattern,Decreased stride length General Gait Details: small lateral steps to R, increased RUE support with stance on LLE to advance RLE. small steps with increased difficulty coordinating placement of LLE Gait velocity: decreased Gait velocity interpretation: <1.31 ft/sec, indicative of household ambulator    ADL: ADL Overall ADL's : Needs assistance/impaired Eating/Feeding: Set up,Sitting,Bed level Grooming: Wash/dry hands,Wash/dry face,Oral care,Brushing hair,Minimal assistance,Sitting Grooming Details (indicate cue type and reason): sitting EOB Upper Body Bathing: Moderate assistance,Sitting Lower Body Bathing:  Maximal assistance,Sit to/from stand Upper Body Dressing : Moderate assistance,Sitting Lower Body Dressing: Maximal assistance,Sit to/from stand Lower Body Dressing Details (indicate cue type and reason): Pt able to don socks with mod A Toilet Transfer: Maximal assistance,+2 for physical assistance,+2 for safety/equipment,BSC Toileting- Clothing Manipulation and Hygiene: Maximal assistance,Sit to/from stand Functional mobility during ADLs: Moderate assistance,+2 for physical assistance,+2 for safety/equipment General ADL Comments: Pt limited by Lt hemiparesis and Lt inattention as well as visual field deficit  Cognition: Cognition Overall Cognitive Status: Impaired/Different from baseline Arousal/Alertness: Awake/alert Orientation Level: Oriented X4 Attention: Sustained Sustained Attention: Appears intact Memory: Impaired Memory Impairment: Retrieval deficit Awareness: Impaired Awareness Impairment: Anticipatory impairment Problem Solving: Impaired Problem Solving Impairment: Verbal basic Behaviors: Lability Safety/Judgment: Impaired Cognition Arousal/Alertness: Awake/alert Behavior During Therapy: WFL for tasks assessed/performed Overall Cognitive Status: Impaired/Different from baseline Area of Impairment: Following commands,Safety/judgement,Awareness,Problem solving Following Commands: Follows one step commands consistently,Follows multi-step commands inconsistently Safety/Judgement: Decreased awareness of safety,Decreased awareness of deficits Awareness: Emergent Problem Solving: Requires verbal cues General Comments: pt alert and oriented, able to voice need for thickened liquid and other safety measures, but needing cues to attend to L side when scanning room. making corrections well to cues, highly motivated. emotionally labile through session in regards to function lost, but easily redirectable  Physical Exam: Blood pressure 126/70, pulse 61, temperature 98.4 F (36.9 C),  temperature source Oral, resp. rate 17, height 5\' 9"  (1.753 m), SpO2 92 %. Physical Exam Constitutional:      Appearance: He is obese.  HENT:     Head: Normocephalic and atraumatic.     Nose: Nose normal.     Mouth/Throat:     Mouth: Mucous membranes are moist.     Comments: Thrush on tongue Eyes:     Extraocular Movements: Extraocular movements intact.     Pupils: Pupils are equal, round, and reactive to light.  Cardiovascular:     Rate and Rhythm: Normal rate and regular rhythm.     Pulses: Normal pulses.     Heart sounds: Normal heart sounds.  Pulmonary:     Effort: Pulmonary effort is normal.     Breath sounds: Normal breath sounds.  Abdominal:     General: Bowel sounds are normal. There is no distension.     Palpations: Abdomen is soft.     Tenderness: There is no abdominal tenderness.  Musculoskeletal:        General: Swelling and tenderness present.     Cervical back: Normal range of motion.     Comments: Mild right and left shoulder tenderness with AROM/PROM  Skin:    General: Skin is warm and dry.  Neurological:     Mental Status: He is alert.     Comments: Patient is alert.  No acute distress.  He does exhibit some left inattention.  Speech is dysarthric but intelligible.  Follows commands. Left central 7 and tongue deviation. Senses pain and light touch left face,arm, leg. DTR's 2+ LUE and LLE and 1+ on right. LUE: 1/5 delt, pec, biceps and 0/5 elsewhere. LLE: 3 to 3+/5 HF, KE and ADF/PF.  Psychiatric:     Comments: Pleasant, pseudobulbar affect     Results for orders placed or performed during the hospital encounter of 05/10/20 (from the past 48 hour(s))  Glucose, capillary     Status: Abnormal   Collection Time: 05/12/20  7:30 AM  Result Value Ref Range   Glucose-Capillary 240 (H) 70 - 99 mg/dL    Comment: Glucose reference range applies only to samples taken after fasting for at least 8 hours.  Glucose, capillary     Status: Abnormal   Collection Time:  05/12/20 11:59 AM  Result Value Ref Range   Glucose-Capillary 232 (H) 70 - 99 mg/dL    Comment: Glucose reference range applies only to samples taken after fasting for at least 8 hours.  Glucose, capillary     Status: Abnormal   Collection Time: 05/12/20  3:17 PM  Result Value Ref Range   Glucose-Capillary 141 (H) 70 - 99 mg/dL    Comment: Glucose reference range applies only to samples taken after fasting for at least 8 hours.  Glucose, capillary     Status: Abnormal   Collection Time: 05/12/20  7:34 PM  Result Value Ref Range   Glucose-Capillary 239 (H) 70 - 99 mg/dL    Comment: Glucose reference range applies only to samples taken after fasting for at least 8 hours.  Glucose, capillary     Status: Abnormal   Collection Time: 05/12/20 11:23 PM  Result Value Ref Range   Glucose-Capillary 242 (H) 70 - 99 mg/dL    Comment: Glucose reference range applies only to samples taken after fasting for at least 8 hours.  CBC     Status: Abnormal   Collection Time: 05/13/20  2:29 AM  Result Value Ref Range   WBC 11.9 (H) 4.0 - 10.5 K/uL   RBC 3.62 (L) 4.22 - 5.81 MIL/uL   Hemoglobin 11.6 (L) 13.0 - 17.0 g/dL   HCT 36.0 (L) 39.0 - 52.0 %   MCV 99.4 80.0 - 100.0 fL   MCH 32.0 26.0 - 34.0 pg   MCHC 32.2 30.0 - 36.0 g/dL   RDW 13.5 11.5 - 15.5 %   Platelets 234 150 - 400 K/uL   nRBC 0.0 0.0 - 0.2 %    Comment: Performed at [facility], [place] [address], [place], [place] [address]  Basic metabolic panel     Status: Abnormal   Collection Time: 05/13/20  2:29 AM  Result Value Ref Range   Sodium 143 135 - 145 mmol/L   Potassium 3.8 3.5 - 5.1 mmol/L   Chloride 109 98 - 111 mmol/L   CO2 27 22 - 32 mmol/L   Glucose, Bld 223 (H) 70 - 99 mg/dL    Comment: Glucose reference range applies only to samples taken after fasting for at least 8 hours.   BUN 24 (H) 8 - 23 mg/dL   Creatinine, Ser 2.02 (H) 0.61 - 1.24 mg/dL   Calcium 8.7 (L) 8.9 - 10.3 mg/dL   GFR, Estimated 34 (L) >60 mL/min     Comment: (NOTE) Calculated using the CKD-EPI Creatinine Equation (2021)    Anion gap 7 5 - 15    Comment: Performed at [facility]  Lab, [address] [place], [place] [address]  Glucose, capillary     Status: Abnormal   Collection Time: 05/13/20  3:15 AM  Result Value Ref Range   Glucose-Capillary 219 (H) 70 - 99 mg/dL    Comment: Glucose reference range applies only to samples taken after fasting for at least 8 hours.  Glucose, capillary     Status: Abnormal   Collection Time: 05/13/20  7:31 AM  Result Value Ref Range   Glucose-Capillary 176 (H) 70 - 99 mg/dL    Comment: Glucose reference range applies only to samples taken after fasting for at least 8 hours.  Glucose, capillary     Status: Abnormal   Collection Time: 05/13/20 11:45 AM  Result Value Ref Range   Glucose-Capillary 299 (H) 70 - 99 mg/dL    Comment: Glucose reference range applies only to samples taken after fasting for at least 8 hours.   Comment 1 Notify RN    Comment 2 Document in Chart   Glucose, capillary     Status: Abnormal   Collection Time: 05/13/20  4:33 PM  Result Value Ref Range   Glucose-Capillary 286 (H) 70 - 99 mg/dL    Comment: Glucose reference range applies only to samples taken after fasting for at least 8 hours.   Comment 1 Notify RN    Comment 2 Document in Chart   Glucose, capillary     Status: Abnormal   Collection Time: 05/13/20  8:14 PM  Result Value Ref Range   Glucose-Capillary 228 (H) 70 - 99 mg/dL    Comment: Glucose reference range applies only to samples taken after fasting for at least 8 hours.  Glucose, capillary     Status: Abnormal   Collection Time: 05/13/20 11:52 PM  Result Value Ref Range   Glucose-Capillary 144 (H) 70 - 99 mg/dL    Comment: Glucose reference range applies only to samples taken after fasting for at least 8 hours.  CBC     Status: Abnormal   Collection Time: 05/14/20  2:09 AM  Result Value Ref Range   WBC 10.3 4.0 - 10.5 K/uL   RBC 3.73 (L) 4.22 - 5.81  MIL/uL   Hemoglobin 11.7 (L) 13.0 - 17.0 g/dL   HCT 35.7 (L) 39.0 - 52.0 %   MCV 95.7 80.0 - 100.0 fL   MCH 31.4 26.0 - 34.0 pg   MCHC 32.8 30.0 - 36.0 g/dL   RDW 13.2 11.5 - 15.5 %   Platelets 194 150 - 400 K/uL   nRBC 0.0 0.0 - 0.2 %    Comment: Performed at [facility], [place] [address], [place], [place] [address]  Basic metabolic panel     Status: Abnormal   Collection Time: 05/14/20  2:09 AM  Result Value Ref Range   Sodium 140 135 - 145 mmol/L   Potassium 3.5 3.5 - 5.1 mmol/L   Chloride 109 98 - 111 mmol/L   CO2 27 22 - 32 mmol/L   Glucose, Bld 176 (H) 70 - 99 mg/dL    Comment: Glucose reference range applies only to samples taken after fasting for at least 8 hours.   BUN 24 (H) 8 - 23 mg/dL   Creatinine, Ser 1.82 (H) 0.61 - 1.24 mg/dL   Calcium 8.2 (L) 8.9 - 10.3 mg/dL   GFR, Estimated 38 (L) >60 mL/min    Comment: (NOTE) Calculated using the CKD-EPI Creatinine Equation (2021)    Anion gap 4 (L) 5 - 15  Comment: Performed at [facility], [place] [address], [place], [place] [address]  Glucose, capillary     Status: Abnormal   Collection Time: 05/14/20  3:25 AM  Result Value Ref Range   Glucose-Capillary 194 (H) 70 - 99 mg/dL    Comment: Glucose reference range applies only to samples taken after fasting for at least 8 hours.   Comment 1 Notify RN    Comment 2 Document in Chart    DG Swallowing Func-Speech Pathology  Result Date: 05/12/2020 Objective Swallowing Evaluation: Type of Study: MBS-Modified Barium Swallow Study  Patient Details Name: BEAUFORD LANDO MRN: [id number] Date of Birth: [birth year] Today's Date: 05/12/2020 Time: SLP Start Time (ACUTE ONLY): 1403 -SLP Stop Time (ACUTE ONLY): 1424 SLP Time Calculation (min) (ACUTE ONLY): 21 min Past Medical History: Past Medical History: Diagnosis Date . Chronic kidney disease   kidney stones . Diabetes mellitus without complication ([place])  . Hyperlipidemia  . Hypertension  . Tinea pedis  Past Surgical History: Past  Surgical History: Procedure Laterality Date . COLON SURGERY   . COLONOSCOPY WITH PROPOFOL N/A 09/18/2016  Procedure: COLONOSCOPY WITH PROPOFOL;  Surgeon: Lollie Sails, MD;  Location: [facility] ENDOSCOPY;  Service: Endoscopy;  Laterality: N/A; . EYE SURGERY    cataract extraction . IR CT HEAD LTD  05/10/2020 . IR PERCUTANEOUS ART THROMBECTOMY/INFUSION INTRACRANIAL INC DIAG ANGIO  05/10/2020 . RADIOLOGY WITH ANESTHESIA N/A 05/10/2020  Procedure: IR WITH ANESTHESIA;  Surgeon: Radiologist, Medication, MD;  Location: [facility];  Service: Radiology;  Laterality: N/A; HPI: 75 year-old male presented to [facility] on 4/11 with new left-sided weakness and slurred speech. 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  On 4/11 he was emergently underwent revascularization of occluded superior division right MCA through mechanical thrombectomy.  ETT 4/11-/12.  No data recorded Assessment / Plan / Recommendation CHL IP CLINICAL IMPRESSIONS 05/12/2020 Clinical Impression Oropharyngeal dysphagia exhibited by lingual pumping, decreased cohesion, delayed transit and mistimed sequlae of swallow. Delayed and effortful bolus propulsion with bolus on posterior base of tongue while pt attempting to retract tongue and initiate swallow. There was left anterior spill and stasis with thinner consistencies. Thin barium penetrated vestibule and was aspirated into before protective mechanisms were initiated followed by reflexive cough. A chin tuck did not eliminate or reduce aspiration which occured silently. Nectar thick boluses consistently reached his pyriform sinuses for 4-5 seconds prior to onset. Minimal valleculuar residue. Challenged with nectar with larger sips via straw filling pyriform sinuses which did not enter vestibule. Recommend pt initiate Dys 1 (pureee), nectar, crush pills, check left side oral cavity for pocketing and  full assist for strategies. SLP Visit Diagnosis Dysphagia, oropharyngeal phase (R13.12) Attention and concentration deficit following -- Frontal lobe and executive function deficit following -- Impact on safety and function Moderate aspiration risk   CHL IP TREATMENT RECOMMENDATION 05/12/2020 Treatment Recommendations Therapy as outlined in treatment plan below   Prognosis 05/12/2020 Prognosis for Safe Diet Advancement Good Barriers to Reach Goals -- Barriers/Prognosis Comment -- CHL IP DIET RECOMMENDATION 05/12/2020 SLP Diet Recommendations Dysphagia 1 (Puree) solids;Nectar thick liquid Liquid Administration via Cup;Straw Medication Administration Crushed with puree Compensations Minimize environmental distractions;Slow rate;Small sips/bites;Lingual sweep for clearance of pocketing Postural Changes Seated upright at 90 degrees   CHL IP OTHER RECOMMENDATIONS 05/12/2020 Recommended Consults -- Oral Care Recommendations Oral care BID Other Recommendations Order thickener from pharmacy   [facility] IP  FOLLOW UP RECOMMENDATIONS 05/12/2020 Follow up Recommendations Inpatient Rehab   CHL IP FREQUENCY AND DURATION 05/12/2020 Speech Therapy Frequency (ACUTE ONLY) min 2x/week Treatment Duration 2 weeks      CHL IP ORAL PHASE 05/12/2020 Oral Phase Impaired Oral - Pudding Teaspoon -- Oral - Pudding Cup -- Oral - Honey Teaspoon -- Oral - Honey Cup -- Oral - Nectar Teaspoon -- Oral - Nectar Cup Lingual pumping;Delayed oral transit;Weak lingual manipulation;Reduced posterior propulsion Oral - Nectar Straw -- Oral - Thin Teaspoon -- Oral - Thin Cup Left anterior bolus loss;Decreased bolus cohesion;Left pocketing in lateral sulci Oral - Thin Straw -- Oral - Puree WFL Oral - Mech Soft -- Oral - Regular Delayed oral transit;Weak lingual manipulation;Impaired mastication Oral - Multi-Consistency -- Oral - Pill -- Oral Phase - Comment --  CHL IP PHARYNGEAL PHASE 05/12/2020 Pharyngeal Phase Impaired Pharyngeal- Pudding Teaspoon -- Pharyngeal --  Pharyngeal- Pudding Cup -- Pharyngeal -- Pharyngeal- Honey Teaspoon -- Pharyngeal -- Pharyngeal- Honey Cup -- Pharyngeal -- Pharyngeal- Nectar Teaspoon -- Pharyngeal -- Pharyngeal- Nectar Cup Delayed swallow initiation-pyriform sinuses;Delayed swallow initiation-vallecula;Pharyngeal residue - valleculae Pharyngeal -- Pharyngeal- Nectar Straw -- Pharyngeal -- Pharyngeal- Thin Teaspoon -- Pharyngeal -- Pharyngeal- Thin Cup Penetration/Aspiration before swallow;Compensatory strategies attempted (with notebox) Pharyngeal Material enters airway, passes BELOW cords without attempt by patient to eject out (silent aspiration);Material enters airway, passes BELOW cords and not ejected out despite cough attempt by patient Pharyngeal- Thin Straw -- Pharyngeal -- Pharyngeal- Puree WFL Pharyngeal -- Pharyngeal- Mechanical Soft -- Pharyngeal -- Pharyngeal- Regular WFL Pharyngeal -- Pharyngeal- Multi-consistency -- Pharyngeal -- Pharyngeal- Pill -- Pharyngeal -- Pharyngeal Comment --  CHL IP CERVICAL ESOPHAGEAL PHASE 05/12/2020 Cervical Esophageal Phase WFL Pudding Teaspoon -- Pudding Cup -- Honey Teaspoon -- Honey Cup -- Nectar Teaspoon -- Nectar Cup -- Nectar Straw -- Thin Teaspoon -- Thin Cup -- Thin Straw -- Puree -- Mechanical Soft -- Regular -- Multi-consistency -- Pill -- Cervical Esophageal Comment -- Houston Siren 05/12/2020, 4:48 PM  Orbie Pyo Litaker M.Ed Actor Pager [phone] Office [phone]             ECHOCARDIOGRAM COMPLETE  Result Date: 05/12/2020    ECHOCARDIOGRAM REPORT   Patient Name:   CHELSEA NUSZ Date of Exam: 05/12/2020 Medical Rec #:  [id number]       Height:       69.0 in Accession #:    [id number]      Weight:       220.2 lb Date of Birth:  1945/05/15      BSA:          2.152 m Patient Age:    73 years        BP:           106/56 mmHg Patient Gender: M               HR:           74 bpm. Exam Location:  Inpatient Procedure: 2D Echo, Cardiac Doppler and Color  Doppler Indications:    CVA  History:        Patient has no prior history of Echocardiogram examinations.                 Signs/Symptoms:CKD; Risk Factors:Hypertension, Dyslipidemia,                 Diabetes and Former Smoker.  Sonographer:    Dustin Flock Referring Phys: [place]  1. Left  ventricular ejection fraction, by estimation, is 70 to 75%. The left ventricle has hyperdynamic function. The left ventricle has no regional wall motion abnormalities. There is mild concentric left ventricular hypertrophy. Left ventricular diastolic parameters were normal.  2. Right ventricular systolic function is normal. The right ventricular size is normal.  3. The mitral valve is normal in structure. Trivial mitral valve regurgitation.  4. The aortic valve is tricuspid. There is mild thickening of the aortic valve. Aortic valve regurgitation is not visualized. No aortic stenosis is present.  5. Aortic dilatation noted. There is mild dilatation of the aortic root, measuring 40 mm. Comparison(s): No prior Echocardiogram. Conclusion(s)/Recommendation(s): No intracardiac source of embolism detected on this transthoracic study. A transesophageal echocardiogram is recommended to exclude cardiac source of embolism if clinically indicated. FINDINGS  Left Ventricle: Left ventricular ejection fraction, by estimation, is 70 to 75%. The left ventricle has hyperdynamic function. The left ventricle has no regional wall motion abnormalities. The left ventricular internal cavity size was normal in size. There is mild concentric left ventricular hypertrophy. Left ventricular diastolic parameters were normal. Right Ventricle: The right ventricular size is normal. Right vetricular wall thickness was not well visualized. Right ventricular systolic function is normal. Left Atrium: Left atrial size was normal in size. Right Atrium: Right atrial size was normal in size. Pericardium: There is no evidence of pericardial  effusion. Mitral Valve: The mitral valve is normal in structure. Trivial mitral valve regurgitation. Tricuspid Valve: The tricuspid valve is normal in structure. Tricuspid valve regurgitation is trivial. Aortic Valve: The aortic valve is tricuspid. There is mild thickening of the aortic valve. Aortic valve regurgitation is not visualized. No aortic stenosis is present. Pulmonic Valve: The pulmonic valve was normal in structure. Pulmonic valve regurgitation is trivial. Aorta: Aortic dilatation noted. There is mild dilatation of the aortic root, measuring 40 mm. IAS/Shunts: No atrial level shunt detected by color flow Doppler.  LEFT VENTRICLE PLAX 2D LVIDd:         4.25 cm  Diastology LVIDs:         2.00 cm  LV e' medial:    7.18 cm/s LV PW:         0.90 cm  LV E/e' medial:  10.3 LV IVS:        1.05 cm  LV e' lateral:   9.57 cm/s LVOT diam:     2.00 cm  LV E/e' lateral: 7.7 LV SV:         71 LV SV Index:   33 LVOT Area:     3.14 cm  RIGHT VENTRICLE RV Basal diam:  3.40 cm RV S prime:     17.90 cm/s TAPSE (M-mode): 3.3 cm LEFT ATRIUM             Index       RIGHT ATRIUM           Index LA diam:        3.50 cm 1.63 cm/m  RA Area:     16.00 cm LA Vol (A2C):   47.9 ml 22.25 ml/m RA Volume:   42.30 ml  19.65 ml/m LA Vol (A4C):   33.7 ml 15.66 ml/m LA Biplane Vol: 41.9 ml 19.47 ml/m  AORTIC VALVE LVOT Vmax:   127.00 cm/s LVOT Vmean:  82.500 cm/s LVOT VTI:    0.225 m  AORTA Ao Root diam: 3.50 cm MITRAL VALVE MV Area (PHT): 4.21 cm    SHUNTS MV Decel Time: 180 msec  Systemic VTI:  0.22 m MV E velocity: 74.10 cm/s  Systemic Diam: 2.00 cm MV A velocity: 75.00 cm/s MV E/A ratio:  0.99 Gwyndolyn Kaufman MD Electronically signed by Gwyndolyn Kaufman MD Signature Date/Time: 05/12/2020/12:59:11 PM    Final    VAS [place] LOWER EXTREMITY VENOUS (DVT)  Result Date: 05/12/2020  Lower Venous DVT Study Indications: Swelling and erythema LT>RT, s/p stroke.  Limitations: S/P RT cath. Comparison Study: No prior studies. Performing  Technologist: Darlin Coco RDMS,RVT  Examination Guidelines: A complete evaluation includes B-mode imaging, spectral Doppler, color Doppler, and power Doppler as needed of all accessible portions of each vessel. Bilateral testing is considered an integral part of a complete examination. Limited examinations for reoccurring indications may be performed as noted. The reflux portion of the exam is performed with the patient in reverse Trendelenburg.  +---------+---------------+---------+-----------+----------+-------------------+ RIGHT    CompressibilityPhasicitySpontaneityPropertiesThrombus Aging      +---------+---------------+---------+-----------+----------+-------------------+ CFV      Full           Yes      Yes                                      +---------+---------------+---------+-----------+----------+-------------------+ SFJ                                                   Limited visibility                                                        due to recent cath  +---------+---------------+---------+-----------+----------+-------------------+ FV Prox  Full                                                             +---------+---------------+---------+-----------+----------+-------------------+ FV Mid   Full                                                             +---------+---------------+---------+-----------+----------+-------------------+ FV DistalFull                                                             +---------+---------------+---------+-----------+----------+-------------------+ PFV      Full                                                             +---------+---------------+---------+-----------+----------+-------------------+ POP  Full           Yes      Yes                                      +---------+---------------+---------+-----------+----------+-------------------+ PTV      Full                                                              +---------+---------------+---------+-----------+----------+-------------------+ PERO     Full                                                             +---------+---------------+---------+-----------+----------+-------------------+   +---------+---------------+---------+-----------+----------+--------------+ LEFT     CompressibilityPhasicitySpontaneityPropertiesThrombus Aging +---------+---------------+---------+-----------+----------+--------------+ CFV      Full           Yes      Yes                                 +---------+---------------+---------+-----------+----------+--------------+ SFJ      Full                                                        +---------+---------------+---------+-----------+----------+--------------+ FV Prox  Full                                                        +---------+---------------+---------+-----------+----------+--------------+ FV Mid   Full                                                        +---------+---------------+---------+-----------+----------+--------------+ FV DistalFull                                                        +---------+---------------+---------+-----------+----------+--------------+ PFV      Full                                                        +---------+---------------+---------+-----------+----------+--------------+ POP      Full           Yes      Yes                                 +---------+---------------+---------+-----------+----------+--------------+  PTV      Full                                                        +---------+---------------+---------+-----------+----------+--------------+ PERO     Full                                                        +---------+---------------+---------+-----------+----------+--------------+     Summary: RIGHT: - There is no evidence of deep vein thrombosis  in the lower extremity.  - No cystic structure found in the popliteal fossa.  LEFT: - There is no evidence of deep vein thrombosis in the lower extremity.  - No cystic structure found in the popliteal fossa.  *See table(s) above for measurements and observations. Electronically signed by Monica Martinez MD on 05/12/2020 at 2:16:36 PM.    Final        Medical Problem List and Plan: 1.  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  Antithrombotics: -DVT/anticoagulation: SCDs  -antiplatelet therapy: Aspirin and Plavix 75 mg daily x3 months then Plavix alone 3. Pain Management: Lyrica 50 mg twice daily 4. Mood: Provide emotional support  -antipsychotic agents: N/A  -add nuedexta for PBA. Discussed with pt/wife who would like to try it. 5. Neuropsych: This patient is capable of making decisions on his own behalf. 6. Skin/Wound Care: Routine skin checks 7. Fluids/Electrolytes/Nutrition: Routine in and outs with follow-up chemistries on Monday  8.  Dysphagia.  Dysphagia #1 nectar liquids.  Follow-up speech therapy 9.  CKD stage IV.  Admission BUN 2.26.  Follow-up chemistries 10.  Diabetes mellitus with peripheral neuropathy.  Hemoglobin A1c 8.1.    -NovoLog 8 units 3 times daily  - Lantus insulin 35 units daily.    -Check blood sugars before meals and at bedtime 11.  Hypertension.  Norvasc 2.5 mg daily.  Monitor with increased mobility on rehab 12.  Hyperlipidemia.  Lipitor 13.Tobacco abuse.Counseling    Cathlyn Parsons, PA-C 05/14/2020

## 2020-05-14 NOTE — H&P (Signed)
Physical Medicine and Rehabilitation Admission H&P       HPI: Thomas Sampson is a 75 year old right-handed male with history of chronic kidney disease stage IV, diabetes mellitus, hypertension, hyperlipidemia and tobacco abuse.  Per chart review patient lives with spouse independent prior to admission.  1 level home with ramped entrance.  Presented 05/10/2020 to [facility] with left-sided weakness and slurred speech of acute onset.  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  No significant carotid or vertebral artery stenosis of the neck.  Admission chemistries unremarkable except glucose 207, BUN 31, creatinine 2.26 hemoglobin A1c 8.1.  Echocardiogram with ejection fraction of 70 to 75% no wall motion abnormalities.  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  Lower extremity Dopplers negative for DVT.  Initially maintained on Cleviprex for blood pressure control.  Neurology follow-up currently maintained on aspirin 81 mg daily plus Plavix 75 mg daily for 3 months then Plavix alone.  Awaiting   loop recorder placement today..  Pt on dysphagia #1 nectar thick liquid diet.  Therapy evaluations completed due to patient's left-sided weakness and slurred speech. He was evaluated by the rehab team who felt that he would benefit from a comprehensive rehab program.   Review of Systems  Constitutional: Negative for chills and fever.  HENT: Negative for hearing loss.   Eyes: Negative for blurred vision and double vision.  Respiratory: Negative for cough and shortness of breath.   Cardiovascular: Negative for chest pain and leg swelling.  Gastrointestinal: Positive for constipation. Negative for heartburn, nausea and vomiting.  Genitourinary: Negative for dysuria,  flank pain and hematuria.  Musculoskeletal: Positive for joint pain and myalgias.  Skin: Negative for rash.  Neurological: Positive for speech change and weakness.  All other systems reviewed and are negative.       Past Medical History:  Diagnosis Date  . Chronic kidney disease      kidney stones  . Diabetes mellitus without complication ([place])    . Hyperlipidemia    . Hypertension    . Tinea pedis           Past Surgical History:  Procedure Laterality Date  . COLON SURGERY      . COLONOSCOPY WITH PROPOFOL N/A 09/18/2016    Procedure: COLONOSCOPY WITH PROPOFOL;  Surgeon: Lollie Sails, MD;  Location: [facility] ENDOSCOPY;  Service: Endoscopy;  Laterality: N/A;  . EYE SURGERY        cataract extraction  . IR CT HEAD LTD   05/10/2020  . IR PERCUTANEOUS ART THROMBECTOMY/INFUSION INTRACRANIAL INC DIAG ANGIO   05/10/2020  . RADIOLOGY WITH ANESTHESIA N/A 05/10/2020    Procedure: IR WITH ANESTHESIA;  Surgeon: Radiologist, Medication, MD;  Location: [facility];  Service: Radiology;  Laterality: N/A;         Family History  Problem Relation Age of Onset  . Cancer Mother    . Heart attack Father    . Heart attack Sister    . Heart disease Sister    . Diabetes Brother      Social History:  reports that he has quit smoking. His smoking use included cigarettes. He quit after 40.00 years of use. He has never used smokeless tobacco. He reports that he does not drink alcohol and does not use drugs.  Allergies:      Allergies  Allergen Reactions  . Demerol [Meperidine]      Medications Prior to Admission  Medication Sig Dispense Refill  . Alpha-Lipoic Acid 600 MG TABS Take by mouth.      Marland Kitchen amLODipine (NORVASC) 2.5 MG tablet Take 2.5 mg by mouth daily.      Marland Kitchen aspirin EC 81 MG tablet Take by mouth.      . calcitRIOL (ROCALTROL) 0.25 MCG capsule Take 0.25 mcg by mouth daily.      . Cholecalciferol 2000 units CAPS Take 2,000 Units by mouth daily.      . cholestyramine (QUESTRAN) 4 GM/DOSE powder  Take by mouth 2 (two) times daily with a meal. (Patient not taking: No sig reported)      . colestipol (COLESTID) 1 g tablet Take 1 g by mouth 2 (two) times daily.      . cyanocobalamin (,VITAMIN B-12,) 1000 MCG/ML injection Inject 76ml IM once very 4 weeks then once a month for 4 months. (Patient not taking: No sig reported)      . cyanocobalamin 1000 MCG tablet Take 1,000 mcg by mouth daily.      Marland Kitchen gabapentin (NEURONTIN) 300 MG capsule Take by mouth 3 (three) times daily.       Marland Kitchen glipiZIDE (GLUCOTROL XL) 10 MG 24 hr tablet Take 10 mg by mouth at bedtime.      Marland Kitchen glucose blood (ONETOUCH ULTRA) test strip Use 2 (two) times daily Dx E11.9      . hydrochlorothiazide (HYDRODIURIL) 12.5 MG tablet Take 12.5 mg by mouth daily.      . insulin aspart (NOVOLOG) 100 UNIT/ML injection Inject 26 Units into the skin AC breakfast. take 26 units before breakfast and 22 units before supper (Patient not taking: No sig reported)      . insulin aspart protamine - aspart (NOVOLOG 70/30 MIX) (70-30) 100 UNIT/ML FlexPen 42-52 Units. 42 units qam and 52 qhs      . Insulin Pen Needle (BD PEN NEEDLE NANO U/F) 32G X 4 MM MISC USE WITH INJECTIONS 2 TIMES DAILY      . lisinopril (PRINIVIL,ZESTRIL) 40 MG tablet Take 40 mg by mouth daily. Take 20 mg twice a day      . metFORMIN (GLUCOPHAGE-XR) 500 MG 24 hr tablet Take 1,000 mg by mouth 2 (two) times daily. (Patient not taking: No sig reported)      . metoprolol succinate (TOPROL-XL) 100 MG 24 hr tablet Take 100 mg by mouth daily. Take with or immediately following a meal.      . pantoprazole (PROTONIX) 40 MG tablet Take 40 mg by mouth daily.      . pregabalin (LYRICA) 75 MG capsule Take 75 mg by mouth 2 (two) times daily.          Drug Regimen Review Drug regimen was reviewed and remains appropriate with no significant issues identified   Home: Home Living Family/patient expects to be discharged to:: Private residence Living Arrangements: Spouse/significant other Available  Help at Discharge: Family,Available 24 hours/day Type of Home: House Home Access: Ramped entrance Home Layout: One level Bathroom Shower/Tub: Insurance claims handler: Shower seat - built in,Walker - 2 wheels  Lives With: Spouse   Functional History: Prior Function Level of Independence: Independent Comments: Retired, used to work in Psych[employer], occupational for [employer]:  Mobility: [place] bed mobility: Needs Assistance Bed Mobility: Supine to Sit Supine to sit: Min assist,HOB  elevated General bed mobility comments: cues for moving BLE to EOB, needing minA to complete movement. minA to elevate trunk from elevated HOB. pt then able to steady in sitting EOB without assist Transfers Overall transfer level: Needs assistance Equipment used: 1 person hand held assist,2 person hand held assist Transfers: Sit to/from [place] to Stand: Min assist Stand pivot transfers: Min assist,+2 physical assistance General transfer comment: minA of 1 to power up from chair, pt with no noted buckling in LLE, able to maintain wt shift in midline. minA of 2 to take lateral steps and pivot to recliner. RUE support with stance on LLE Ambulation/Gait Ambulation/Gait assistance: Min assist,+2 physical assistance Gait Distance (Feet): 5 Feet Assistive device: 2 person hand held assist Gait Pattern/deviations: Step-to pattern,Decreased stride length General Gait Details: small lateral steps to R, increased RUE support with stance on LLE to advance RLE. small steps with increased difficulty coordinating placement of LLE Gait velocity: decreased Gait velocity interpretation: <1.31 ft/sec, indicative of household ambulator   ADL: ADL Overall ADL's : Needs assistance/impaired Eating/Feeding: Set up,Sitting,Bed level Grooming: Wash/dry hands,Wash/dry face,Oral care,Brushing hair,Minimal assistance,Sitting Grooming Details (indicate cue type and  reason): sitting EOB Upper Body Bathing: Moderate assistance,Sitting Lower Body Bathing: Maximal assistance,Sit to/from stand Upper Body Dressing : Moderate assistance,Sitting Lower Body Dressing: Maximal assistance,Sit to/from stand Lower Body Dressing Details (indicate cue type and reason): Pt able to don socks with mod A Toilet Transfer: Maximal assistance,+2 for physical assistance,+2 for safety/equipment,BSC Toileting- Clothing Manipulation and Hygiene: Maximal assistance,Sit to/from stand Functional mobility during ADLs: Moderate assistance,+2 for physical assistance,+2 for safety/equipment General ADL Comments: Pt limited by Lt hemiparesis and Lt inattention as well as visual field deficit   Cognition: Cognition Overall Cognitive Status: Impaired/Different from baseline Arousal/Alertness: Awake/alert Orientation Level: Oriented X4 Attention: Sustained Sustained Attention: Appears intact Memory: Impaired Memory Impairment: Retrieval deficit Awareness: Impaired Awareness Impairment: Anticipatory impairment Problem Solving: Impaired Problem Solving Impairment: Verbal basic Behaviors: Lability Safety/Judgment: Impaired Cognition Arousal/Alertness: Awake/alert Behavior During Therapy: WFL for tasks assessed/performed Overall Cognitive Status: Impaired/Different from baseline Area of Impairment: Following commands,Safety/judgement,Awareness,Problem solving Following Commands: Follows one step commands consistently,Follows multi-step commands inconsistently Safety/Judgement: Decreased awareness of safety,Decreased awareness of deficits Awareness: Emergent Problem Solving: Requires verbal cues General Comments: pt alert and oriented, able to voice need for thickened liquid and other safety measures, but needing cues to attend to L side when scanning room. making corrections well to cues, highly motivated. emotionally labile through session in regards to function lost, but easily  redirectable   Physical Exam: Blood pressure 126/70, pulse 61, temperature 98.4 F (36.9 C), temperature source Oral, resp. rate 17, height 5\' 9"  (1.753 m), SpO2 92 %. Physical Exam Constitutional:      Appearance: He is obese.  HENT:     Head: Normocephalic and atraumatic.     Nose: Nose normal.     Mouth/Throat:     Mouth: Mucous membranes are moist.     Comments: Thrush on tongue Eyes:     Extraocular Movements: Extraocular movements intact.     Pupils: Pupils are equal, round, and reactive to light.  Cardiovascular:     Rate and Rhythm: Normal rate and regular rhythm.     Pulses: Normal pulses.     Heart sounds: Normal heart sounds.  Pulmonary:     Effort: Pulmonary effort is normal.     Breath sounds: Normal breath sounds.  Abdominal:     General: Bowel sounds are normal. There is no distension.  Palpations: Abdomen is soft.     Tenderness: There is no abdominal tenderness.  Musculoskeletal:        General: Swelling and tenderness present.     Cervical back: Normal range of motion.     Comments: Mild right and left shoulder tenderness with AROM/PROM  Skin:    General: Skin is warm and dry.  Neurological:     Mental Status: He is alert.     Comments: Patient is alert.  No acute distress.  He does exhibit some left inattention.  Speech is dysarthric but intelligible.  Follows commands. Left central 7 and tongue deviation. Senses pain and light touch left face,arm, leg. DTR's 2+ LUE and LLE and 1+ on right. LUE: 1/5 delt, pec, biceps and 0/5 elsewhere. LLE: 3 to 3+/5 HF, KE and ADF/PF.  Psychiatric:     Comments: Pleasant, pseudobulbar affect        Lab Results Last 48 Hours        Results for orders placed or performed during the hospital encounter of 05/10/20 (from the past 48 hour(s))  Glucose, capillary     Status: Abnormal    Collection Time: 05/12/20  7:30 AM  Result Value Ref Range    Glucose-Capillary 240 (H) 70 - 99 mg/dL      Comment: Glucose reference  range applies only to samples taken after fasting for at least 8 hours.  Glucose, capillary     Status: Abnormal    Collection Time: 05/12/20 11:59 AM  Result Value Ref Range    Glucose-Capillary 232 (H) 70 - 99 mg/dL      Comment: Glucose reference range applies only to samples taken after fasting for at least 8 hours.  Glucose, capillary     Status: Abnormal    Collection Time: 05/12/20  3:17 PM  Result Value Ref Range    Glucose-Capillary 141 (H) 70 - 99 mg/dL      Comment: Glucose reference range applies only to samples taken after fasting for at least 8 hours.  Glucose, capillary     Status: Abnormal    Collection Time: 05/12/20  7:34 PM  Result Value Ref Range    Glucose-Capillary 239 (H) 70 - 99 mg/dL      Comment: Glucose reference range applies only to samples taken after fasting for at least 8 hours.  Glucose, capillary     Status: Abnormal    Collection Time: 05/12/20 11:23 PM  Result Value Ref Range    Glucose-Capillary 242 (H) 70 - 99 mg/dL      Comment: Glucose reference range applies only to samples taken after fasting for at least 8 hours.  CBC     Status: Abnormal    Collection Time: 05/13/20  2:29 AM  Result Value Ref Range    WBC 11.9 (H) 4.0 - 10.5 K/uL    RBC 3.62 (L) 4.22 - 5.81 MIL/uL    Hemoglobin 11.6 (L) 13.0 - 17.0 g/dL    HCT 36.0 (L) 39.0 - 52.0 %    MCV 99.4 80.0 - 100.0 fL    MCH 32.0 26.0 - 34.0 pg    MCHC 32.2 30.0 - 36.0 g/dL    RDW 13.5 11.5 - 15.5 %    Platelets 234 150 - 400 K/uL    nRBC 0.0 0.0 - 0.2 %      Comment: Performed at [facility], [place] [address] [place], [place] [address]  Basic metabolic panel     Status: Abnormal  Collection Time: 05/13/20  2:29 AM  Result Value Ref Range    Sodium 143 135 - 145 mmol/L    Potassium 3.8 3.5 - 5.1 mmol/L    Chloride 109 98 - 111 mmol/L    CO2 27 22 - 32 mmol/L    Glucose, Bld 223 (H) 70 - 99 mg/dL      Comment: Glucose reference range applies only to samples taken after fasting for  at least 8 hours.    BUN 24 (H) 8 - 23 mg/dL    Creatinine, Ser 2.02 (H) 0.61 - 1.24 mg/dL    Calcium 8.7 (L) 8.9 - 10.3 mg/dL    GFR, Estimated 34 (L) >60 mL/min      Comment: (NOTE) Calculated using the CKD-EPI Creatinine Equation (2021)      Anion gap 7 5 - 15      Comment: Performed at [facility] [address], [place], [place] [address]  Glucose, capillary     Status: Abnormal    Collection Time: 05/13/20  3:15 AM  Result Value Ref Range    Glucose-Capillary 219 (H) 70 - 99 mg/dL      Comment: Glucose reference range applies only to samples taken after fasting for at least 8 hours.  Glucose, capillary     Status: Abnormal    Collection Time: 05/13/20  7:31 AM  Result Value Ref Range    Glucose-Capillary 176 (H) 70 - 99 mg/dL      Comment: Glucose reference range applies only to samples taken after fasting for at least 8 hours.  Glucose, capillary     Status: Abnormal    Collection Time: 05/13/20 11:45 AM  Result Value Ref Range    Glucose-Capillary 299 (H) 70 - 99 mg/dL      Comment: Glucose reference range applies only to samples taken after fasting for at least 8 hours.    Comment 1 Notify RN      Comment 2 Document in Chart    Glucose, capillary     Status: Abnormal    Collection Time: 05/13/20  4:33 PM  Result Value Ref Range    Glucose-Capillary 286 (H) 70 - 99 mg/dL      Comment: Glucose reference range applies only to samples taken after fasting for at least 8 hours.    Comment 1 Notify RN      Comment 2 Document in Chart    Glucose, capillary     Status: Abnormal    Collection Time: 05/13/20  8:14 PM  Result Value Ref Range    Glucose-Capillary 228 (H) 70 - 99 mg/dL      Comment: Glucose reference range applies only to samples taken after fasting for at least 8 hours.  Glucose, capillary     Status: Abnormal    Collection Time: 05/13/20 11:52 PM  Result Value Ref Range    Glucose-Capillary 144 (H) 70 - 99 mg/dL      Comment: Glucose reference range  applies only to samples taken after fasting for at least 8 hours.  CBC     Status: Abnormal    Collection Time: 05/14/20  2:09 AM  Result Value Ref Range    WBC 10.3 4.0 - 10.5 K/uL    RBC 3.73 (L) 4.22 - 5.81 MIL/uL    Hemoglobin 11.7 (L) 13.0 - 17.0 g/dL    HCT 35.7 (L) 39.0 - 52.0 %    MCV 95.7 80.0 - 100.0 fL    MCH 31.4  26.0 - 34.0 pg    MCHC 32.8 30.0 - 36.0 g/dL    RDW 13.2 11.5 - 15.5 %    Platelets 194 150 - 400 K/uL    nRBC 0.0 0.0 - 0.2 %      Comment: Performed at [facility], [place] [address], [place], [place] [address]  Basic metabolic panel     Status: Abnormal    Collection Time: 05/14/20  2:09 AM  Result Value Ref Range    Sodium 140 135 - 145 mmol/L    Potassium 3.5 3.5 - 5.1 mmol/L    Chloride 109 98 - 111 mmol/L    CO2 27 22 - 32 mmol/L    Glucose, Bld 176 (H) 70 - 99 mg/dL      Comment: Glucose reference range applies only to samples taken after fasting for at least 8 hours.    BUN 24 (H) 8 - 23 mg/dL    Creatinine, Ser 1.82 (H) 0.61 - 1.24 mg/dL    Calcium 8.2 (L) 8.9 - 10.3 mg/dL    GFR, Estimated 38 (L) >60 mL/min      Comment: (NOTE) Calculated using the CKD-EPI Creatinine Equation (2021)      Anion gap 4 (L) 5 - 15      Comment: Performed at [facility] [address], [place], [place] [address]  Glucose, capillary     Status: Abnormal    Collection Time: 05/14/20  3:25 AM  Result Value Ref Range    Glucose-Capillary 194 (H) 70 - 99 mg/dL      Comment: Glucose reference range applies only to samples taken after fasting for at least 8 hours.    Comment 1 Notify RN      Comment 2 Document in Chart         Imaging Results (Last 48 hours)  DG Swallowing Func-Speech Pathology   Result Date: 05/12/2020 Objective Swallowing Evaluation: Type of Study: MBS-Modified Barium Swallow Study  Patient Details Name: ARTY LANTZY MRN: [id number] Date of Birth: 11-02-1945 Today's Date: 05/12/2020 Time: SLP Start Time (ACUTE ONLY): 1403 -SLP Stop Time  (ACUTE ONLY): 1424 SLP Time Calculation (min) (ACUTE ONLY): 21 min Past Medical History: Past Medical History: Diagnosis Date . Chronic kidney disease   kidney stones . Diabetes mellitus without complication ([place])  . Hyperlipidemia  . Hypertension  . Tinea pedis  Past Surgical History: Past Surgical History: Procedure Laterality Date . COLON SURGERY   . COLONOSCOPY WITH PROPOFOL N/A 09/18/2016  Procedure: COLONOSCOPY WITH PROPOFOL;  Surgeon: Lollie Sails, MD;  [facility] ENDOSCOPY;  Service: Endoscopy;  Laterality: N/A; . EYE SURGERY    cataract extraction . IR CT HEAD LTD  05/10/2020 . IR PERCUTANEOUS ART THROMBECTOMY/INFUSION INTRACRANIAL INC DIAG ANGIO  05/10/2020 . RADIOLOGY WITH ANESTHESIA N/A 05/10/2020  Procedure: IR WITH ANESTHESIA;  Surgeon: Radiologist, Medication, MD;  Location: [facility];  Service: Radiology;  Laterality: N/A; HPI: 75 year-old male presented to [facility] on 4/11 with new left-sided weakness and slurred speech. 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  On 4/11 he was emergently underwent revascularization of occluded superior division right MCA through mechanical thrombectomy.  ETT 4/11-/12.  No data recorded Assessment / Plan / Recommendation CHL IP CLINICAL IMPRESSIONS 05/12/2020 Clinical Impression Oropharyngeal dysphagia exhibited by lingual pumping, decreased cohesion, delayed transit and mistimed sequlae of swallow. Delayed and effortful bolus  propulsion with bolus on posterior base of tongue while pt attempting to retract tongue and initiate swallow. There was left anterior spill and stasis with thinner consistencies. Thin barium penetrated vestibule and was aspirated into before protective mechanisms were initiated followed by reflexive cough. A chin tuck did not eliminate or reduce aspiration which occured silently. Nectar thick boluses consistently reached  his pyriform sinuses for 4-5 seconds prior to onset. Minimal valleculuar residue. Challenged with nectar with larger sips via straw filling pyriform sinuses which did not enter vestibule. Recommend pt initiate Dys 1 (pureee), nectar, crush pills, check left side oral cavity for pocketing and full assist for strategies. SLP Visit Diagnosis Dysphagia, oropharyngeal phase (R13.12) Attention and concentration deficit following -- Frontal lobe and executive function deficit following -- Impact on safety and function Moderate aspiration risk   CHL IP TREATMENT RECOMMENDATION 05/12/2020 Treatment Recommendations Therapy as outlined in treatment plan below   Prognosis 05/12/2020 Prognosis for Safe Diet Advancement Good Barriers to Reach Goals -- Barriers/Prognosis Comment -- CHL IP DIET RECOMMENDATION 05/12/2020 SLP Diet Recommendations Dysphagia 1 (Puree) solids;Nectar thick liquid Liquid Administration via Cup;Straw Medication Administration Crushed with puree Compensations Minimize environmental distractions;Slow rate;Small sips/bites;Lingual sweep for clearance of pocketing Postural Changes Seated upright at 90 degrees   CHL IP OTHER RECOMMENDATIONS 05/12/2020 Recommended Consults -- Oral Care Recommendations Oral care BID Other Recommendations Order thickener from pharmacy   CHL IP FOLLOW UP RECOMMENDATIONS 05/12/2020 Follow up Recommendations Inpatient Rehab   CHL IP FREQUENCY AND DURATION 05/12/2020 Speech Therapy Frequency (ACUTE ONLY) min 2x/week Treatment Duration 2 weeks      CHL IP ORAL PHASE 05/12/2020 Oral Phase Impaired Oral - Pudding Teaspoon -- Oral - Pudding Cup -- Oral - Honey Teaspoon -- Oral - Honey Cup -- Oral - Nectar Teaspoon -- Oral - Nectar Cup Lingual pumping;Delayed oral transit;Weak lingual manipulation;Reduced posterior propulsion Oral - Nectar Straw -- Oral - Thin Teaspoon -- Oral - Thin Cup Left anterior bolus loss;Decreased bolus cohesion;Left pocketing in lateral sulci Oral - Thin Straw -- Oral -  Puree WFL Oral - Mech Soft -- Oral - Regular Delayed oral transit;Weak lingual manipulation;Impaired mastication Oral - Multi-Consistency -- Oral - Pill -- Oral Phase - Comment --  CHL IP PHARYNGEAL PHASE 05/12/2020 Pharyngeal Phase Impaired Pharyngeal- Pudding Teaspoon -- Pharyngeal -- Pharyngeal- Pudding Cup -- Pharyngeal -- Pharyngeal- Honey Teaspoon -- Pharyngeal -- Pharyngeal- Honey Cup -- Pharyngeal -- Pharyngeal- Nectar Teaspoon -- Pharyngeal -- Pharyngeal- Nectar Cup Delayed swallow initiation-pyriform sinuses;Delayed swallow initiation-vallecula;Pharyngeal residue - valleculae Pharyngeal -- Pharyngeal- Nectar Straw -- Pharyngeal -- Pharyngeal- Thin Teaspoon -- Pharyngeal -- Pharyngeal- Thin Cup Penetration/Aspiration before swallow;Compensatory strategies attempted (with notebox) Pharyngeal Material enters airway, passes BELOW cords without attempt by patient to eject out (silent aspiration);Material enters airway, passes BELOW cords and not ejected out despite cough attempt by patient Pharyngeal- Thin Straw -- Pharyngeal -- Pharyngeal- Puree WFL Pharyngeal -- Pharyngeal- Mechanical Soft -- Pharyngeal -- Pharyngeal- Regular WFL Pharyngeal -- Pharyngeal- Multi-consistency -- Pharyngeal -- Pharyngeal- Pill -- Pharyngeal -- Pharyngeal Comment --  CHL IP CERVICAL ESOPHAGEAL PHASE 05/12/2020 Cervical Esophageal Phase WFL Pudding Teaspoon -- Pudding Cup -- Honey Teaspoon -- Honey Cup -- Nectar Teaspoon -- Nectar Cup -- Nectar Straw -- Thin Teaspoon -- Thin Cup -- Thin Straw -- Puree -- Mechanical Soft -- Regular -- Multi-consistency -- Pill -- Cervical Esophageal Comment -- Houston Siren 05/12/2020, 4:48 PM  Orbie Pyo Litaker M.Ed Risk analyst [phone] Office [phone]  ECHOCARDIOGRAM COMPLETE   Result Date: 05/12/2020    ECHOCARDIOGRAM REPORT   Patient Name:   ROBEN SCHLIEP Date of Exam: 05/12/2020 Medical Rec #:  [id number]       Height:       69.0 in Accession  #:    [id number]      Weight:       220.2 lb Date of Birth:  [birth year]      BSA:          2.152 m Patient Age:    9 years        BP:           106/56 mmHg Patient Gender: M               HR:           74 bpm. Exam Location:  Inpatient Procedure: 2D Echo, Cardiac Doppler and Color Doppler Indications:    CVA  History:        Patient has no prior history of Echocardiogram examinations.                 Signs/Symptoms:CKD; Risk Factors:Hypertension, Dyslipidemia,                 Diabetes and Former Smoker.  Sonographer:    Dustin Flock Referring Phys: [place]  1. Left ventricular ejection fraction, by estimation, is 70 to 75%. The left ventricle has hyperdynamic function. The left ventricle has no regional wall motion abnormalities. There is mild concentric left ventricular hypertrophy. Left ventricular diastolic parameters were normal.  2. Right ventricular systolic function is normal. The right ventricular size is normal.  3. The mitral valve is normal in structure. Trivial mitral valve regurgitation.  4. The aortic valve is tricuspid. There is mild thickening of the aortic valve. Aortic valve regurgitation is not visualized. No aortic stenosis is present.  5. Aortic dilatation noted. There is mild dilatation of the aortic root, measuring 40 mm. Comparison(s): No prior Echocardiogram. Conclusion(s)/Recommendation(s): No intracardiac source of embolism detected on this transthoracic study. A transesophageal echocardiogram is recommended to exclude cardiac source of embolism if clinically indicated. FINDINGS  Left Ventricle: Left ventricular ejection fraction, by estimation, is 70 to 75%. The left ventricle has hyperdynamic function. The left ventricle has no regional wall motion abnormalities. The left ventricular internal cavity size was normal in size. There is mild concentric left ventricular hypertrophy. Left ventricular diastolic parameters were normal. Right Ventricle: The right  ventricular size is normal. Right vetricular wall thickness was not well visualized. Right ventricular systolic function is normal. Left Atrium: Left atrial size was normal in size. Right Atrium: Right atrial size was normal in size. Pericardium: There is no evidence of pericardial effusion. Mitral Valve: The mitral valve is normal in structure. Trivial mitral valve regurgitation. Tricuspid Valve: The tricuspid valve is normal in structure. Tricuspid valve regurgitation is trivial. Aortic Valve: The aortic valve is tricuspid. There is mild thickening of the aortic valve. Aortic valve regurgitation is not visualized. No aortic stenosis is present. Pulmonic Valve: The pulmonic valve was normal in structure. Pulmonic valve regurgitation is trivial. Aorta: Aortic dilatation noted. There is mild dilatation of the aortic root, measuring 40 mm. IAS/Shunts: No atrial level shunt detected by color flow Doppler.  LEFT VENTRICLE PLAX 2D LVIDd:         4.25 cm  Diastology LVIDs:         2.00 cm  LV e' medial:  7.18 cm/s LV PW:         0.90 cm  LV E/e' medial:  10.3 LV IVS:        1.05 cm  LV e' lateral:   9.57 cm/s LVOT diam:     2.00 cm  LV E/e' lateral: 7.7 LV SV:         71 LV SV Index:   33 LVOT Area:     3.14 cm  RIGHT VENTRICLE RV Basal diam:  3.40 cm RV S prime:     17.90 cm/s TAPSE (M-mode): 3.3 cm LEFT ATRIUM             Index       RIGHT ATRIUM           Index LA diam:        3.50 cm 1.63 cm/m  RA Area:     16.00 cm LA Vol (A2C):   47.9 ml 22.25 ml/m RA Volume:   42.30 ml  19.65 ml/m LA Vol (A4C):   33.7 ml 15.66 ml/m LA Biplane Vol: 41.9 ml 19.47 ml/m  AORTIC VALVE LVOT Vmax:   127.00 cm/s LVOT Vmean:  82.500 cm/s LVOT VTI:    0.225 m  AORTA Ao Root diam: 3.50 cm MITRAL VALVE MV Area (PHT): 4.21 cm    SHUNTS MV Decel Time: 180 msec    Systemic VTI:  0.22 m MV E velocity: 74.10 cm/s  Systemic Diam: 2.00 cm MV A velocity: 75.00 cm/s MV E/A ratio:  0.99 Gwyndolyn Kaufman MD Electronically signed by Gwyndolyn Kaufman MD Signature Date/Time: 05/12/2020/12:59:11 PM    Final     VAS [place] LOWER EXTREMITY VENOUS (DVT)   Result Date: 05/12/2020  Lower Venous DVT Study Indications: Swelling and erythema LT>RT, s/p stroke.  Limitations: S/P RT cath. Comparison Study: No prior studies. Performing Technologist: Darlin Coco RDMS,RVT  Examination Guidelines: A complete evaluation includes B-mode imaging, spectral Doppler, color Doppler, and power Doppler as needed of all accessible portions of each vessel. Bilateral testing is considered an integral part of a complete examination. Limited examinations for reoccurring indications may be performed as noted. The reflux portion of the exam is performed with the patient in reverse Trendelenburg.  +---------+---------------+---------+-----------+----------+-------------------+ RIGHT    CompressibilityPhasicitySpontaneityPropertiesThrombus Aging      +---------+---------------+---------+-----------+----------+-------------------+ CFV      Full           Yes      Yes                                      +---------+---------------+---------+-----------+----------+-------------------+ SFJ                                                   Limited visibility                                                        due to recent cath  +---------+---------------+---------+-----------+----------+-------------------+ FV Prox  Full                                                             +---------+---------------+---------+-----------+----------+-------------------+  FV Mid   Full                                                             +---------+---------------+---------+-----------+----------+-------------------+ FV DistalFull                                                             +---------+---------------+---------+-----------+----------+-------------------+ PFV      Full                                                              +---------+---------------+---------+-----------+----------+-------------------+ POP      Full           Yes      Yes                                      +---------+---------------+---------+-----------+----------+-------------------+ PTV      Full                                                             +---------+---------------+---------+-----------+----------+-------------------+ PERO     Full                                                             +---------+---------------+---------+-----------+----------+-------------------+   +---------+---------------+---------+-----------+----------+--------------+ LEFT     CompressibilityPhasicitySpontaneityPropertiesThrombus Aging +---------+---------------+---------+-----------+----------+--------------+ CFV      Full           Yes      Yes                                 +---------+---------------+---------+-----------+----------+--------------+ SFJ      Full                                                        +---------+---------------+---------+-----------+----------+--------------+ FV Prox  Full                                                        +---------+---------------+---------+-----------+----------+--------------+ FV Mid   Full                                                        +---------+---------------+---------+-----------+----------+--------------+  FV DistalFull                                                        +---------+---------------+---------+-----------+----------+--------------+ PFV      Full                                                        +---------+---------------+---------+-----------+----------+--------------+ POP      Full           Yes      Yes                                 +---------+---------------+---------+-----------+----------+--------------+ PTV      Full                                                         +---------+---------------+---------+-----------+----------+--------------+ PERO     Full                                                        +---------+---------------+---------+-----------+----------+--------------+     Summary: RIGHT: - There is no evidence of deep vein thrombosis in the lower extremity.  - No cystic structure found in the popliteal fossa.  LEFT: - There is no evidence of deep vein thrombosis in the lower extremity.  - No cystic structure found in the popliteal fossa.  *See table(s) above for measurements and observations. Electronically signed by Monica Martinez MD on 05/12/2020 at 2:16:36 PM.    Final              Medical Problem List and Plan: 1.  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  Antithrombotics: -DVT/anticoagulation: SCDs             -antiplatelet therapy: Aspirin and Plavix 75 mg daily x3 months then Plavix alone 3. Pain Management: Lyrica 50 mg twice daily 4. Mood: Provide emotional support             -antipsychotic agents: N/A             -add nuedexta for PBA. Discussed with pt/wife who would like to try it. 5. Neuropsych: This patient is capable of making decisions on his own behalf. 6. Skin/Wound Care: Routine skin checks 7. Fluids/Electrolytes/Nutrition: Routine in and outs with follow-up chemistries on Monday  8.  Dysphagia.  Dysphagia #1 nectar liquids.  Follow-up speech therapy 9.  CKD stage IV.  Admission BUN 2.26.  Follow-up chemistries 10.  Diabetes mellitus with peripheral neuropathy.  Hemoglobin A1c 8.1.               -NovoLog 8 units 3 times daily             - Lantus insulin 35 units daily.               -Check blood sugars before meals and at bedtime 11.   Hypertension.  Norvasc 2.5 mg daily.  Monitor with increased mobility on rehab 12.  Hyperlipidemia.  Lipitor 13.Tobacco abuse.Counseling       Lavon Paganini Angiulli, PA-C 05/14/2020  I have personally performed a face to face diagnostic evaluation of this patient and formulated the key components of the plan.  Additionally, I have personally reviewed laboratory data, imaging studies, as well as relevant notes and concur with the physician assistant's documentation above.  The patient's status has not changed from the original H&P.  Any changes in documentation from the acute care chart have been noted above.  Meredith Staggers, MD, [facility]

## 2020-05-14 NOTE — Consult Note (Signed)
Cardiology Consultation:   Patient ID: ELROY SCHEMBRI MRN: [id number]; DOB: Jun 24, 1945  Admit date: 05/10/2020 Date of Consult: 05/14/2020  PCP:  Baxter Hire, MD   [place]  Cardiologist:  No primary care provider on file.  Advanced Practice Provider:  No care team member to display Electrophysiologist:  None        Patient Profile:   EZRAEL SAM is a 75 y.o. male with a hx of CKD, diabetes, hypertension, hyperlipidemia who is being seen today for the evaluation of cryptogenic stroke at the request of Rosalin Hawking.  History of Present Illness:   Mr. Salois presented to [facility] hospital with left-sided weakness, slurred speech.  MRI showed an acute right insular and parietal infarct with occlusion of the right M3 superior branch.  He had IR revascularization.  Thus far, no acute abnormality is found to explain his stroke.  He continues to have symptoms.  He has weakening of his left upper extremity, some slurred speech, and emotional swings.  Aside from that, he has no complaints.   Past Medical History:  Diagnosis Date  . Chronic kidney disease    kidney stones  . Diabetes mellitus without complication ([place])   . Hyperlipidemia   . Hypertension   . Tinea pedis     Past Surgical History:  Procedure Laterality Date  . COLON SURGERY    . COLONOSCOPY WITH PROPOFOL N/A 09/18/2016   Procedure: COLONOSCOPY WITH PROPOFOL;  Surgeon: Lollie Sails, MD;  Location: [facility] ENDOSCOPY;  Service: Endoscopy;  Laterality: N/A;  . EYE SURGERY     cataract extraction  . IR CT HEAD LTD  05/10/2020  . IR PERCUTANEOUS ART THROMBECTOMY/INFUSION INTRACRANIAL INC DIAG ANGIO  05/10/2020  . RADIOLOGY WITH ANESTHESIA N/A 05/10/2020   Procedure: IR WITH ANESTHESIA;  Surgeon: Radiologist, Medication, MD;  Location: [facility];  Service: Radiology;  Laterality: N/A;     Home Medications:  Prior to Admission medications   Medication Sig Start Date End Date Taking?  Authorizing Provider  Alpha-Lipoic Acid 600 MG TABS Take by mouth.    [provider]  amLODipine (NORVASC) 2.5 MG tablet Take 2.5 mg by mouth daily.    [provider]  aspirin EC 81 MG tablet Take by mouth.    [provider]  calcitRIOL (ROCALTROL) 0.25 MCG capsule Take 0.25 mcg by mouth daily. 04/26/20   [provider]  Cholecalciferol 2000 units CAPS Take 2,000 Units by mouth daily.    [provider]  cholestyramine Lucrezia Starch) 4 GM/DOSE powder Take by mouth 2 (two) times daily with a meal. Patient not taking: No sig reported    [provider]  colestipol (COLESTID) 1 g tablet Take 1 g by mouth 2 (two) times daily. 04/08/20   [provider]  cyanocobalamin (,VITAMIN B-12,) 1000 MCG/ML injection Inject 60m IM once very 4 weeks then once a month for 4 months. Patient not taking: No sig reported 09/27/18   [provider]  cyanocobalamin 1000 MCG tablet Take 1,000 mcg by mouth daily.    [provider]  gabapentin (NEURONTIN) 300 MG capsule Take by mouth 3 (three) times daily.  08/03/17 10/15/18  [provider]  glipiZIDE (GLUCOTROL XL) 10 MG 24 hr tablet Take 10 mg by mouth at bedtime.    [provider]  glucose blood (ONETOUCH ULTRA) test strip Use 2 (two) times daily Dx E11.9 07/26/18   [provider]  hydrochlorothiazide (HYDRODIURIL) 12.5 MG tablet Take  12.5 mg by mouth daily.    [provider]  insulin aspart (NOVOLOG) 100 UNIT/ML injection Inject 26 Units into the skin AC breakfast. take 26 units before breakfast and 22 units before supper Patient not taking: No sig reported    [provider]  insulin aspart protamine - aspart (NOVOLOG 70/30 MIX) (70-30) 100 UNIT/ML FlexPen 42-52 Units. 42 units qam and 52 qhs 05/01/17   [provider]  Insulin Pen Needle (BD PEN NEEDLE NANO U/F) 32G X 4 MM MISC USE WITH INJECTIONS 2 TIMES DAILY 07/26/18   [provider]  lisinopril (PRINIVIL,ZESTRIL) 40 MG tablet Take 40 mg by mouth daily. Take 20 mg twice a day    [provider]  metFORMIN (GLUCOPHAGE-XR) 500 MG 24 hr tablet Take 1,000 mg by mouth 2 (two) times daily. Patient not taking: No sig reported    [provider]  metoprolol succinate (TOPROL-XL) 100 MG 24 hr tablet Take 100 mg by mouth daily. Take with or immediately following a meal.    [provider]  pantoprazole (PROTONIX) 40 MG tablet Take 40 mg by mouth daily.    [provider]  pregabalin (LYRICA) 75 MG capsule Take 75 mg by mouth 2 (two) times daily. 05/05/20   [provider]    Inpatient Medications: Scheduled Meds: .  stroke: mapping our early stages of recovery book   Does not apply Once  . amLODipine  2.5 mg Per Tube Daily  . aspirin  324 mg Per Tube Daily  . atorvastatin  40 mg Per Tube Daily  . chlorhexidine gluconate (MEDLINE KIT)  15 mL Mouth Rinse BID  . Chlorhexidine Gluconate Cloth  6 each Topical Daily  . clopidogrel  75 mg Per Tube Daily  . insulin aspart  0-20 Units Subcutaneous TID WC  . insulin aspart  0-5 Units Subcutaneous QHS  . insulin aspart  12 Units Subcutaneous TID WC  . insulin glargine  42 Units Subcutaneous Daily  . mouth rinse  15 mL Mouth Rinse q12n4p  . pantoprazole  40 mg Oral Daily  . pregabalin  50 mg Per Tube BID   Continuous Infusions: . sodium chloride 50 mL/hr at 05/13/20 2100   PRN Meds: acetaminophen **OR** acetaminophen (TYLENOL) oral liquid 160 mg/5 mL **OR** acetaminophen, hydrALAZINE, ondansetron (ZOFRAN) IV, Resource ThickenUp Clear, senna-docusate  Allergies:    Allergies  Allergen Reactions  . Demerol [Meperidine]     Social History:   Social History   Socioeconomic History  . Marital status: Married    Spouse name: Not on file  . Number of children: Not on file  . Years of education: Not on file  . Highest education level: Not on file  Occupational History  .  Not on file  Tobacco Use  . Smoking status: Former Smoker    Years: 40.00    Types: Cigarettes  . Smokeless tobacco: Never Used  Vaping Use  . Vaping Use: Never used  Substance and Sexual Activity  . Alcohol use: No  . Drug use: No  . Sexual activity: Not on file  Other Topics Concern  . Not on file  Social History Narrative  . Not on file   Social Determinants of Health   Financial Resource Strain: Not on file  Food Insecurity: Not on file  Transportation Needs: Not on file  Physical Activity: Not on file  Stress: Not on file  Social Connections: Not on file  Intimate Partner Violence: Not on file  Family History:    Family History  Problem Relation Age of Onset  . Cancer Mother   . Heart attack Father   . Heart attack Sister   . Heart disease Sister   . Diabetes Brother      ROS:  Please see the history of present illness.   All other ROS reviewed and negative.     Physical Exam/Data:   Vitals:   05/13/20 2003 05/13/20 2355 05/14/20 0327 05/14/20 0845  BP: (!) 150/74 137/71 126/70 135/75  Pulse: 78 70 61 63  Resp: '17 17 17 18  ' Temp: 99 F (37.2 C) 98.8 F (37.1 C) 98.4 F (36.9 C) 98.3 F (36.8 C)  TempSrc: Oral Oral Oral Oral  SpO2:  95% 92% 94%  Height:        Intake/Output Summary (Last 24 hours) at 05/14/2020 1114 Last data filed at 05/14/2020 0330 Gross per 24 hour  Intake 1432.92 ml  Output 1150 ml  Net 282.92 ml   Last 3 Weights 05/10/2020 05/10/2020 11/26/2018  Weight (lbs) 220 lb 3.8 oz 227 lb 216 lb  Weight (kg) 99.9 kg 102.967 kg 97.977 kg     Body mass index is 32.52 kg/m.  General:  Well nourished, well developed, in no acute distress, emotionally labile HEENT: normal Lymph: no adenopathy Neck: no JVD Endocrine:  No thryomegaly Vascular: No carotid bruits; FA pulses 2+ bilaterally without bruits  Cardiac:  normal S1, S2; RRR; no murmur  Lungs:  clear to auscultation bilaterally, no wheezing, rhonchi or rales  Abd: soft,  nontender, no hepatomegaly  Ext: no edema Musculoskeletal:  No deformities, BUE and BLE strength normal and equal Skin: warm and dry  Neuro: Mild dysarthria, left lower facial weakness Psych:  Normal affect   EKG:  The EKG was personally reviewed and demonstrates: Sinus rhythm Telemetry:  Telemetry was personally reviewed and demonstrates: Sinus rhythm  Relevant CV Studies: TTE 05/12/2020 1. Left ventricular ejection fraction, by estimation, is 70 to 75%. The  left ventricle has hyperdynamic function. The left ventricle has no  regional wall motion abnormalities. There is mild concentric left  ventricular hypertrophy. Left ventricular  diastolic parameters were normal.  2. Right ventricular systolic function is normal. The right ventricular  size is normal.  3. The mitral valve is normal in structure. Trivial mitral valve  regurgitation.  4. The aortic valve is tricuspid. There is mild thickening of the aortic  valve. Aortic valve regurgitation is not visualized. No aortic stenosis is  present.  5. Aortic dilatation noted. There is mild dilatation of the aortic root,  measuring 40 mm.   Laboratory Data:  High Sensitivity Troponin:  No results for input(s): TROPONINIHS in the last 720 hours.   Chemistry Recent Labs  Lab 05/12/20 0444 05/13/20 0229 05/14/20 0209  NA 145 143 140  K 3.5 3.8 3.5  CL 111 109 109  CO2 '26 27 27  ' GLUCOSE 213* 223* 176*  BUN 27* 24* 24*  CREATININE 2.05* 2.02* 1.82*  CALCIUM 8.3* 8.7* 8.2*  GFRNONAA 33* 34* 38*  ANIONGAP 8 7 4*    Recent Labs  Lab 05/11/20 0405 05/11/20 1147 05/12/20 0444  PROT 6.1* 6.3* 5.7*  ALBUMIN 3.4* 3.5 3.3*  AST '15 15 15  ' ALT '15 11 11  ' ALKPHOS 47 46 43  BILITOT 0.8 0.7 0.9   Hematology Recent Labs  Lab 05/12/20 0444 05/13/20 0229 05/14/20 0209  WBC 15.2* 11.9* 10.3  RBC 3.70* 3.62* 3.73*  HGB 11.6* 11.6* 11.7*  HCT 35.6* 36.0* 35.7*  MCV 96.2 99.4 95.7  MCH 31.4 32.0 31.4  MCHC 32.6 32.2 32.8   RDW 13.8 13.5 13.2  PLT 241 234 194   BNPNo results for input(s): BNP, PROBNP in the last 168 hours.  DDimer No results for input(s): DDIMER in the last 168 hours.   Radiology/Studies:  CT Head Wo Contrast  Result Date: 05/11/2020 CLINICAL DATA:  75 year old male status post code stroke presentation yesterday with right M3 occlusion status post endovascular revascularization. Contrast staining, subarachnoid contrast/blood intraoperatively. Subsequent encounter. EXAM: CT HEAD WITHOUT CONTRAST TECHNIQUE: Contiguous axial images were obtained from the base of the skull through the vertex without intravenous contrast. COMPARISON:  NIR flat panel head CT 1758 hours on 05/10/2020. Presentation plain head CT 0942 hours 05/10/2020. FINDINGS: Brain: Small volume subarachnoid hemorrhage along the right sylvian fissure (series 3, image 18), new from the presentation CT yesterday but regressed compared to 1758 hours yesterday. No associated intraventricular hemorrhage or ventriculomegaly. Normal basilar cisterns. No midline shift. Roughly 4 cm area of cytotoxic edema is evident now at the right frontal operculum on series 3, image 21. No intra-axial hemorrhage identified. Stable gray-white matter differentiation elsewhere. Vascular: Some residual intravascular contrast is suspected. Skull: No acute osseous abnormality identified. Sinuses/Orbits: Visualized paranasal sinuses and mastoids are stable and well aerated. Other: Endotracheal tube partially visible in the oral cavity. No acute orbit or scalp soft tissue finding. IMPRESSION: 1. Small volume subarachnoid hemorrhage along the right Sylvian fissure is regressed compared to 1758 hours yesterday. 2. Cytotoxic edema now visible at the right frontal operculum. But no malignant hemorrhagic transformation. No significant intracranial mass effect. 3. Preliminary report of the above discussed by telephone with Dr. Quinn Axe on 05/11/2020 at 0552 hours. Electronically Signed    By: Genevie Ann M.D.   On: 05/11/2020 06:05   MR ANGIO HEAD WO CONTRAST  Result Date: 05/11/2020 CLINICAL DATA:  Stroke, follow-up. EXAM: MRI HEAD WITHOUT CONTRAST MRA HEAD WITHOUT CONTRAST TECHNIQUE: Multiplanar, multiecho pulse sequences of the brain and surrounding structures were obtained without intravenous contrast. Angiographic images of the head were obtained using MRA technique without contrast. COMPARISON:  Head CT 05/11/2020. Images from thrombectomy 05/10/2020. Noncontrast head CT, CT angiogram head/neck and CT perfusion 05/10/2020. MRI/MRA head 05/10/2020. Small chronic infarct within the right cerebellar hemisphere. FINDINGS: MRI HEAD FINDINGS Brain: Mild-to-moderate cerebral atrophy. Comparatively mild cerebellar atrophy. Acute cortical/subcortical infarct within the right MCA vascular territory. The dominant component of the infarct is present within the right frontal operculum and right insula and now measures 4.7 x 3.9 x 4.2 cm. However, there are additional cortical/subcortical acute infarcts more superiorly and posteriorly within the right frontal lobe and right parietal lobe (with involvement of the pre and postcentral gyri). Additional small acute infarcts are also present within the right caudate nucleus and right frontal lobe more anteriorly. SWI signal loss along the right sylvian fissure, likely reflecting a combination of petechial hemorrhage and the previously demonstrated small-volume acute subarachnoid hemorrhage at this site. Additional petechial hemorrhage within the right caudate nucleus infarct. Additional petechial hemorrhage versus chronic microhemorrhages within the right frontal operculum and right frontal lobe white matter. There is no significant mass effect at this time. No midline shift. Small acute cortically based infarct within the right occipital lobe (PCA vascular territory). Punctate acute infarct within the left occipital lobe (PCA vascular territory) (series 5,  image 70). Background mild multifocal T2/FLAIR hyperintensity within the cerebral white matter is nonspecific, but compatible with chronic small vessel  ischemic disease. Redemonstrated small chronic infarct within the right cerebellar hemisphere. No evidence of intracranial mass. No extra-axial fluid collection. Vascular: Expected proximal arterial flow voids. Skull and upper cervical spine: No focal marrow lesion. Sinuses/Orbits: Visualized orbits show no acute finding. Trace scattered paranasal sinus mucosal thickening at the imaged levels. MRA HEAD FINDINGS: The intracranial internal carotid arteries are patent. The M1 middle cerebral arteries are patent. High-grade focal stenosis with possible short-segment flow gap at site of previously demonstrated mid to distal right M2 MCA branch occlusion (series 10, images 125-134) (series 1081, image 3). The anterior cerebral arteries are patent. The intracranial vertebral arteries are patent. The basilar artery is patent. The posterior cerebral arteries are patent. A right posterior communicating artery is present. The left posterior communicating artery is hypoplastic or absent. No intracranial aneurysm is identified. IMPRESSION: MRI brain: 1. Acute right MCA vascular territory infarct as described. This has significantly increased in extent as compared to the brain MRI of 05/10/2020, and may have subtly increased in extent as compared to the head CT performed earlier today. No significant mass effect at this time 2. SWI signal loss along the right sylvian fissure which likely reflects a combination of petechial hemorrhage and the small-volume acute subarachnoid hemorrhage previously demonstrated at this site. 3. Small acute cortically-based infarct within the right occipital lobe (PCA vascular territory). 4. Punctate acute cortical infarct within the left occipital lobe (PCA vascular territory). 5. Background chronic findings without interval change, as described. MRA  head: 1. High-grade focal stenosis with possible short-segment flow gap at site of previously demonstrated mid-to-distal right M2 MCA branch occlusion. 2. Elsewhere, no intracranial large vessel occlusion or proximal high-grade arterial stenosis is identified. Electronically Signed   By: Kellie Simmering DO   On: 05/11/2020 18:10   MR ANGIO HEAD WO CONTRAST  Result Date: 05/10/2020 CLINICAL DATA:  TIA. Dysarthria and slurred speech. Mildly weak on the left. EXAM: MRI HEAD WITHOUT CONTRAST MRA HEAD WITHOUT CONTRAST TECHNIQUE: Multiplanar, multiecho pulse sequences of the brain and surrounding structures were obtained without intravenous contrast. Angiographic images of the head were obtained using MRA technique without contrast. COMPARISON:  CT head 05/10/2020 FINDINGS: MRI HEAD FINDINGS Brain: Acute infarct in the right insular cortex and right parietal operculum. There is FLAIR hyperintensity in the right MCA branches in this area due to slow flow or thrombosis. There is susceptibility in the right sylvian fissure compatible with thrombus in the right MCA branch. Mild atrophy. Mild chronic microvascular ischemic change in the white matter. 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 Negative for mass lesion. Vascular: Normal arterial flow voids at the base of brain. Susceptibility in the right sylvian fissure with FLAIR hyperintensity in the right MCA vessels compatible with slow flow or occlusion Skull and upper cervical spine: Negative Sinuses/Orbits: Mild mucosal edema paranasal sinuses. Bilateral cataract extraction Other: None MRA HEAD FINDINGS Internal carotid artery widely patent bilaterally without stenosis. Anterior cerebral arteries widely patent with mild irregularity. Left middle cerebral artery widely patent with mild irregularity. Right M1 segment patent. Occlusion of the superior division of the right M3 vessel. Inferior division right M2 widely patent. Posterior circulation normal without  significant stenosis. Fetal origin right posterior cerebral artery. IMPRESSION: 1. 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 2. Mild chronic microvascular ischemic change in the white matter 3. 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   On: 05/10/2020 13:52   MR BRAIN WO CONTRAST  Result Date: 05/11/2020 CLINICAL DATA:  Stroke, follow-up. EXAM: MRI HEAD WITHOUT CONTRAST MRA HEAD WITHOUT CONTRAST TECHNIQUE: Multiplanar, multiecho pulse sequences of the brain and surrounding structures were obtained without intravenous contrast. Angiographic images of the head were obtained using MRA technique without contrast. COMPARISON:  Head CT 05/11/2020. Images from thrombectomy 05/10/2020. Noncontrast head CT, CT angiogram head/neck and CT perfusion 05/10/2020. MRI/MRA head 05/10/2020. Small chronic infarct within the right cerebellar hemisphere. FINDINGS: MRI HEAD FINDINGS Brain: Mild-to-moderate cerebral atrophy. Comparatively mild cerebellar atrophy. Acute cortical/subcortical infarct within the right MCA vascular territory. The dominant component of the infarct is present within the right frontal operculum and right insula and now measures 4.7 x 3.9 x 4.2 cm. However, there are additional cortical/subcortical acute infarcts more superiorly and posteriorly within the right frontal lobe and right parietal lobe (with involvement of the pre and postcentral gyri). Additional small acute infarcts are also present within the right caudate nucleus and right frontal lobe more anteriorly. SWI signal loss along the right sylvian fissure, likely reflecting a combination of petechial hemorrhage and the previously demonstrated small-volume acute subarachnoid hemorrhage at this site. Additional petechial hemorrhage within the right caudate nucleus infarct. Additional petechial hemorrhage versus chronic microhemorrhages within the right frontal operculum and right  frontal lobe white matter. There is no significant mass effect at this time. No midline shift. Small acute cortically based infarct within the right occipital lobe (PCA vascular territory). Punctate acute infarct within the left occipital lobe (PCA vascular territory) (series 5, image 70). Background mild multifocal T2/FLAIR hyperintensity within the cerebral white matter is nonspecific, but compatible with chronic small vessel ischemic disease. Redemonstrated small chronic infarct within the right cerebellar hemisphere. No evidence of intracranial mass. No extra-axial fluid collection. Vascular: Expected proximal arterial flow voids. Skull and upper cervical spine: No focal marrow lesion. Sinuses/Orbits: Visualized orbits show no acute finding. Trace scattered paranasal sinus mucosal thickening at the imaged levels. MRA HEAD FINDINGS: The intracranial internal carotid arteries are patent. The M1 middle cerebral arteries are patent. High-grade focal stenosis with possible short-segment flow gap at site of previously demonstrated mid to distal right M2 MCA branch occlusion (series 10, images 125-134) (series 1081, image 3). The anterior cerebral arteries are patent. The intracranial vertebral arteries are patent. The basilar artery is patent. The posterior cerebral arteries are patent. A right posterior communicating artery is present. The left posterior communicating artery is hypoplastic or absent. No intracranial aneurysm is identified. IMPRESSION: MRI brain: 1. Acute right MCA vascular territory infarct as described. This has significantly increased in extent as compared to the brain MRI of 05/10/2020, and may have subtly increased in extent as compared to the head CT performed earlier today. No significant mass effect at this time 2. SWI signal loss along the right sylvian fissure which likely reflects a combination of petechial hemorrhage and the small-volume acute subarachnoid hemorrhage previously  demonstrated at this site. 3. Small acute cortically-based infarct within the right occipital lobe (PCA vascular territory). 4. Punctate acute cortical infarct within the left occipital lobe (PCA vascular territory). 5. Background chronic findings without interval change, as described. MRA head: 1. High-grade focal stenosis with possible short-segment flow gap at site of previously demonstrated mid-to-distal right M2 MCA branch occlusion. 2. Elsewhere, no intracranial large vessel occlusion or proximal high-grade arterial stenosis is identified. Electronically Signed   By: Kellie Simmering DO   On: 05/11/2020 18:10   MR BRAIN WO CONTRAST  Result Date: 05/10/2020 CLINICAL DATA:  TIA. Dysarthria and slurred  speech. Mildly weak on the left. EXAM: MRI HEAD WITHOUT CONTRAST MRA HEAD WITHOUT CONTRAST TECHNIQUE: Multiplanar, multiecho pulse sequences of the brain and surrounding structures were obtained without intravenous contrast. Angiographic images of the head were obtained using MRA technique without contrast. COMPARISON:  CT head 05/10/2020 FINDINGS: MRI HEAD FINDINGS Brain: Acute infarct in the right insular cortex and right parietal operculum. There is FLAIR hyperintensity in the right MCA branches in this area due to slow flow or thrombosis. There is susceptibility in the right sylvian fissure compatible with thrombus in the right MCA branch. Mild atrophy. Mild chronic microvascular ischemic change in the white matter. 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 Negative for mass lesion. Vascular: Normal arterial flow voids at the base of brain. Susceptibility in the right sylvian fissure with FLAIR hyperintensity in the right MCA vessels compatible with slow flow or occlusion Skull and upper cervical spine: Negative Sinuses/Orbits: Mild mucosal edema paranasal sinuses. Bilateral cataract extraction Other: None MRA HEAD FINDINGS Internal carotid artery widely patent bilaterally without stenosis. Anterior  cerebral arteries widely patent with mild irregularity. Left middle cerebral artery widely patent with mild irregularity. Right M1 segment patent. Occlusion of the superior division of the right M3 vessel. Inferior division right M2 widely patent. Posterior circulation normal without significant stenosis. Fetal origin right posterior cerebral artery. IMPRESSION: 1. 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 2. Mild chronic microvascular ischemic change in the white matter 3. 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   On: 05/10/2020 13:52   IR CT Head Ltd  Result Date: 05/12/2020 INDICATION: New onset of right gaze deviation and left sided weakness arm greater than leg. 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 EXAM: 1. EMERGENT LARGE VESSEL OCCLUSION THROMBOLYSIS (anterior CIRCULATION) COMPARISON:  CT angiogram of head and neck of May 10, 2020. MEDICATIONS: Ancef 2 g IV antibiotic was administered within 1 hour of the procedure. ANESTHESIA/SEDATION: General anesthesia CONTRAST:  Isovue 300 approximately 100 mL. FLUOROSCOPY TIME:  Fluoroscopy Time: 79 minutes 24 seconds (2346 mGy). COMPLICATIONS: None immediate. TECHNIQUE: Following a full explanation of the procedure along with the potential associated complications, an informed witnessed consent was obtained. The risks of intracranial hemorrhage of 10%, worsening neurological deficit, ventilator dependency, death and inability to revascularize were all reviewed in detail with the patient's spouse. The patient was then put under general anesthesia by the Department of Anesthesiology at [facility]. The right groin was prepped and draped in the usual sterile fashion. Thereafter using modified Seldinger technique, transfemoral access into the right common femoral artery was obtained without difficulty. Over a 0.035 inch guidewire an 8 [place]  Pinnacle 25 cm sheath was inserted. Through this, and also over a 0.035 inch guidewire a combination of a 5.5 French Simmons 2 catheter inside of the 087 balloon guide catheter was advanced over a 0.035 inch Roadrunner guidewire and positioned without difficulty into the right common carotid artery. The guidewire was removed. Good aspiration was obtained from the hub of the balloon guide catheter. FINDINGS: A gentle control arteriogram performed through the balloon guide catheter demonstrated no evidence of spasms, dissections or of intraluminal filling defects. The right common carotid arteriogram demonstrates wide patency of the right external carotid artery and its major branches. The right internal carotid artery at the bulb to the cranial skull base demonstrates wide patency. The petrous, the cavernous and supraclinoid segments are widely patent. The right  middle cerebral artery and the right anterior cerebral artery are seen to opacify into the capillary and venous phases. 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 Right posterior communicating artery is seen opacifying the right posterior cerebral artery distribution. PROCEDURE: Through the 087 balloon guide catheter in the right common carotid artery, a combination of an 055 136 Zoom aspiration catheter with an 035 Zoom aspiration catheter was advanced over a 0.014 inch standard Synchro micro guidewire with a J configuration to the right M1 segment. Using a torque device, access was obtained to just proximal to the occluded superior division M2 segment. Access to the distal M3 M4 inferior division branch was obtained just distal to the occluded site. However, advancement of the 035 Zoom aspiration catheter was met with significant resistance with change in configuration distally. The micro guidewire, and the 035 Zoom aspiration catheter were removed. The 055 Zoom aspiration  was advanced to be just distal to the occluded site of the superior division. Constant aspiration was then performed via a Penumbra aspiration device for approximately 2 minutes. Thereafter, the 055 Zoom aspiration was retrieved and removed. A control arteriogram performed through the balloon guide in the distal right ICA continued to demonstrate no change in the occluded superior division prominent branch. No clot was noted in the aspirate or within the 055 aspiration catheter. Another pass was then made again using the 055 aspiration catheter with a 162 021 microcatheter over a 0.014 inch standard Synchro micro guidewire. This combination was again advanced as described earlier and positioned at the site of the occlusion. The micro guidewire was replaced with an 016 Fathom micro guidewire. This advanced easily through the occluded superior division occluded branch into the M4 region. This was then followed by the microcatheter. The guidewire was removed. Good aspiration obtained from the hub of the microcatheter. A gentle control arteriogram performed through the microcatheter demonstrated safe position of the tip of the microcatheter which was then connected to continuous heparinized saline infusion. A 3 mm x 40 mm Solitaire X retrieval device was then advanced to the distal end of the microcatheter and deployed in the usual manner without difficulty. The microcatheter was retrieved more proximally into the right internal carotid artery. The 055 Zoom aspiration was then advanced abutting against the occluded superior division M2 segment. With proximal flow arrest in the right internal carotid artery, and continuous aspiration with a 20 mL syringe at the hub of the balloon guide catheter, and constant aspiration applied at the hub of the 055 Zoom aspiration catheter for approximately 2-1/2 minutes, the combination of the retrieval device, the microcatheter and the Zoom 055 aspiration catheter were retrieved and  removed. Thick sticky clot was seen entangled within the retrieval device, and also in the hub of the [place]. A control arteriogram performed following reversal of flow arrest in the right internal carotid artery demonstrated now complete revascularization of 2 of the 3 main branches emanating from the superior division. However, there continued be a distal M3 occlusion of an anterior perisylvian branch leading on up to the hyper perfused subcortical frontal perisylvian region seen on the CT perfusion mapping studies. A TICI 2C revascularization was achieved. 200 mcg of nitroglycerin was then infused through the balloon guide catheter in the right internal carotid artery to obviate mild to moderate spasm at the proximal superior division of the right MCA. A final control arteriogram performed through the balloon guide  in the right common carotid artery continued to demonstrate patency of the extracranial, and intracranial right ICA, a TICI 2C revascularization of the right middle cerebral artery distribution. The right anterior cerebral artery and right posterior communicating artery demonstrate wide patency unchanged. The balloon guide was removed. An 8 French Angio-Seal closure device was applied for hemostasis at the right groin puncture site. Distal pulses were Dopplerable in both feet unchanged. A CT of the brain demonstrated focal area of hyperattenuation in the putamen on the right side associated with moderate hyperattenuation in the posterior right perisylvian subarachnoid space. No evidence of mass or midline shift was noted. The patient was left intubated for airway protection. He was then transferred to the neuro ICU for post embolization management. IMPRESSION: Status post endovascular near complete revascularization of occluded superior division of the right middle cerebral artery with 1 pass with contact aspiration, and 1 pass with contact aspiration with a 3 mm x 40 mm Solitaire retrieval device  achieving a TICI 2C revascularization. PLAN: Follow-up with the referring MD. Electronically Signed   By: Luanne Bras M.D.   On: 05/11/2020 10:58   CT CEREBRAL PERFUSION W CONTRAST  Addendum Date: 05/10/2020   ADDENDUM REPORT: 05/10/2020 15:20 ADDENDUM: These results were called by telephone at the time of interpretation on 05/10/2020 at 3:20 pm to provider [facility] , who verbally acknowledged these results. Electronically Signed   By: Franchot Gallo M.D.   On: 05/10/2020 15:20   Result Date: 05/10/2020 CLINICAL DATA:  Stroke. EXAM: CT ANGIOGRAPHY HEAD AND NECK CT PERFUSION BRAIN TECHNIQUE: Multidetector CT imaging of the head and neck was performed using the standard protocol during bolus administration of intravenous contrast. Multiplanar CT image reconstructions and MIPs were obtained to evaluate the vascular anatomy. Carotid stenosis measurements (when applicable) are obtained utilizing NASCET criteria, using the distal internal carotid diameter as the denominator. Multiphase CT imaging of the brain was performed following IV bolus contrast injection. Subsequent parametric perfusion maps were calculated using RAPID software. CONTRAST:  185m OMNIPAQUE IOHEXOL 350 MG/ML SOLN COMPARISON:  CT head 05/10/2020.  MRI and MRA head 05/10/2020 FINDINGS: CTA NECK FINDINGS Aortic arch: Standard branching. Imaged portion shows no evidence of aneurysm or dissection. No significant stenosis of the major arch vessel origins. Right carotid system: Mild atherosclerotic calcification right carotid bifurcation without significant stenosis. No irregularity or thrombus in the right carotid. Left carotid system: Mild atherosclerotic disease left carotid without stenosis or irregularity. Vertebral arteries: Both vertebral arteries widely patent. Skeleton: Disc degeneration and spurring. Foraminal narrowing due to spurring bilaterally at C5-6 and C6-7. No acute skeletal abnormality. Other neck: No mass or edema  identified. Upper chest: Lung apices clear bilaterally. Review of the MIP images confirms the above findings CTA HEAD FINDINGS Anterior circulation: Cavernous carotid widely patent bilaterally. Anterior cerebral arteries widely patent. Left middle cerebral artery widely patent. Right M1 segment widely patent. There is occlusion of a large right M3 branch superior division right MCA as noted on prior MRA. This appears acute. Posterior circulation: Normal posterior circulation. No stenosis or large vessel occlusion. Venous sinuses: Normal venous enhancement. Anatomic variants: None Review of the MIP images confirms the above findings CT Brain Perfusion Findings: ASPECTS: 10 CBF (<30%) Volume: 275mPerfusion (Tmax>6.0s) volume: 6253mismatch Volume: 38m42mfarction Location:Right insula and right operculum. IMPRESSION: 1. Abnormal CT perfusion. 29 mm core infarct with additional 33 mL of surrounding penumbra in the right insula and operculum corresponding to restricted diffusion on MRI. 2. Occluded right superior  branch of M3 which is a relatively large vessel compatible with an embolus. 3. No significant carotid or vertebral artery stenosis in the neck. Electronically Signed: By: Franchot Gallo M.D. On: 05/10/2020 15:15   DG CHEST PORT 1 VIEW  Result Date: 05/12/2020 CLINICAL DATA:  Aspiration EXAM: PORTABLE CHEST 1 VIEW COMPARISON:  05/11/2020 FINDINGS: Nasogastric tube extends into the upper abdomen. Mild elevation of the right hemidiaphragm is unchanged with associated right basilar atelectasis. Lungs are otherwise clear. No pneumothorax or pleural effusion. Cardiac size is within normal limits. Apparent mediastinal widening is likely artifactual and related to semi-erect positioning. Pulmonary vascularity is normal. IMPRESSION: Stable mild elevation of the right hemidiaphragm with associated right basilar atelectasis. Electronically Signed   By: Fidela Salisbury MD   On: 05/12/2020 06:11   DG CHEST PORT 1  VIEW  Result Date: 05/11/2020 CLINICAL DATA:  Hypoxia EXAM: PORTABLE CHEST 1 VIEW COMPARISON:  05/10/2020 FINDINGS: Cardiac shadow is stable. Gastric catheter is noted in satisfactory position. Previously seen endotracheal tube is no longer identified. Bibasilar atelectatic changes are noted slightly increased when compared with the prior exam likely accentuated by poor inspiratory effort. No acute bony abnormality is seen. IMPRESSION: Increasing bibasilar atelectasis. Electronically Signed   By: Inez Catalina M.D.   On: 05/11/2020 12:48   DG CHEST PORT 1 VIEW  Result Date: 05/10/2020 CLINICAL DATA:  Endotracheal tube adjustment EXAM: PORTABLE CHEST 1 VIEW COMPARISON:  05/10/2020 FINDINGS: Single frontal view of the chest demonstrates endotracheal tube overlying tracheal air column, tip remains approximately 8.7 cm above carina. Recommend advancing 5-6 cm. Cardiac silhouette is stable. Hypoventilatory changes at the lung bases. No effusion or pneumothorax. IMPRESSION: 1. Endotracheal tube remains well above carina, recommend advancing 5-6 cm. 2. Bilateral lower lobe hypoventilatory changes. Electronically Signed   By: Randa Ngo M.D.   On: 05/10/2020 20:18   DG CHEST PORT 1 VIEW  Result Date: 05/10/2020 CLINICAL DATA:  Endotracheal tube present. EXAM: PORTABLE CHEST 1 VIEW COMPARISON:  Earlier this day. FINDINGS: The endotracheal tube is been retracted, the tip is now 9.7 cm above the carina. Re-expansion of the right upper lobe in the interim. Low lung volumes with patchy bibasilar opacities, left greater than right. No visualized pneumothorax. Stable heart size and mediastinal contours. IMPRESSION: 1. Endotracheal tube retracted, tip now 9.7 cm above the carina. Advancement of 5-6 cm would place the endotracheal tube tip at the level of the clavicular heads. 2. Re-expansion of the right upper lobe. 3. Low lung volumes with patchy bibasilar opacities, left greater than right, likely atelectasis.  Electronically Signed   By: Keith Rake M.D.   On: 05/10/2020 20:06   Portable Chest x-ray  Result Date: 05/10/2020 CLINICAL DATA:  Intubated, stroke EXAM: PORTABLE CHEST 1 VIEW COMPARISON:  None. FINDINGS: Single frontal view of the chest demonstrates endotracheal tube overlying tracheal air column, tip approximately 1.5 cm above carina. There is dense right upper lobe consolidation with volume loss consistent with atelectasis. This could be due to mucous plugging. Hypoventilatory changes are seen at the left lung base. No effusion or pneumothorax. No acute bony abnormalities. IMPRESSION: 1. Endotracheal tube approximately 1.5 cm above carina. 2. Dense right upper lobe consolidation and volume loss consistent with atelectasis. This could be due to mucous plugging. 3. Hypoventilatory changes at the left lung base. These results were called by telephone at the time of interpretation on 05/10/2020 at 7:18 pm to provider DR Lucile Shutters , who verbally acknowledged these results. Electronically Signed  By: Randa Ngo M.D.   On: 05/10/2020 19:31   DG Abd Portable 1V  Result Date: 05/11/2020 CLINICAL DATA:  Check gastric catheter placement EXAM: PORTABLE ABDOMEN - 1 VIEW COMPARISON:  None. FINDINGS: Gastric catheter is noted within the stomach. No obstructive changes are seen. No free air is noted. IMPRESSION: Gastric catheter within the stomach. Electronically Signed   By: Inez Catalina M.D.   On: 05/11/2020 12:58   DG Swallowing Func-Speech Pathology  Result Date: 05/12/2020 Objective Swallowing Evaluation: Type of Study: MBS-Modified Barium Swallow Study  Patient Details Name: DAXTEN KOVALENKO MRN: [id number] Date of Birth: [birth year] Today's Date: 05/12/2020 Time: SLP Start Time (ACUTE ONLY): 1403 -SLP Stop Time (ACUTE ONLY): 1424 SLP Time Calculation (min) (ACUTE ONLY): 21 min Past Medical History: Past Medical History: Diagnosis Date . Chronic kidney disease   kidney stones . Diabetes mellitus without  complication ([place])  . Hyperlipidemia  . Hypertension  . Tinea pedis  Past Surgical History: Past Surgical History: Procedure Laterality Date . COLON SURGERY   . COLONOSCOPY WITH PROPOFOL N/A 09/18/2016  Procedure: COLONOSCOPY WITH PROPOFOL;  Surgeon: Lollie Sails, MD;  Location: [facility] ENDOSCOPY;  Service: Endoscopy;  Laterality: N/A; . EYE SURGERY    cataract extraction . IR CT HEAD LTD  05/10/2020 . IR PERCUTANEOUS ART THROMBECTOMY/INFUSION INTRACRANIAL INC DIAG ANGIO  05/10/2020 . RADIOLOGY WITH ANESTHESIA N/A 05/10/2020  Procedure: IR WITH ANESTHESIA;  Surgeon: Radiologist, Medication, MD;  Location: [place];  Service: Radiology;  Laterality: N/A; HPI: 75 year-old male presented to [facility] on 4/11 with new left-sided weakness and slurred speech. 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  On 4/11 he was emergently underwent revascularization of occluded superior division right MCA through mechanical thrombectomy.  ETT 4/11-/12.  No data recorded Assessment / Plan / Recommendation CHL IP CLINICAL IMPRESSIONS 05/12/2020 Clinical Impression Oropharyngeal dysphagia exhibited by lingual pumping, decreased cohesion, delayed transit and mistimed sequlae of swallow. Delayed and effortful bolus propulsion with bolus on posterior base of tongue while pt attempting to retract tongue and initiate swallow. There was left anterior spill and stasis with thinner consistencies. Thin barium penetrated vestibule and was aspirated into before protective mechanisms were initiated followed by reflexive cough. A chin tuck did not eliminate or reduce aspiration which occured silently. Nectar thick boluses consistently reached his pyriform sinuses for 4-5 seconds prior to onset. Minimal valleculuar residue. Challenged with nectar with larger sips via straw filling pyriform sinuses which did not enter vestibule. Recommend  pt initiate Dys 1 (pureee), nectar, crush pills, check left side oral cavity for pocketing and full assist for strategies. SLP Visit Diagnosis Dysphagia, oropharyngeal phase (R13.12) Attention and concentration deficit following -- Frontal lobe and executive function deficit following -- Impact on safety and function Moderate aspiration risk   CHL IP TREATMENT RECOMMENDATION 05/12/2020 Treatment Recommendations Therapy as outlined in treatment plan below   Prognosis 05/12/2020 Prognosis for Safe Diet Advancement Good Barriers to Reach Goals -- Barriers/Prognosis Comment -- CHL IP DIET RECOMMENDATION 05/12/2020 SLP Diet Recommendations Dysphagia 1 (Puree) solids;Nectar thick liquid Liquid Administration via Cup;Straw Medication Administration Crushed with puree Compensations Minimize environmental distractions;Slow rate;Small sips/bites;Lingual sweep for clearance of pocketing Postural Changes Seated upright at 90 degrees   CHL IP OTHER RECOMMENDATIONS 05/12/2020 Recommended Consults -- Oral Care Recommendations Oral care BID Other Recommendations Order thickener from pharmacy   CHL IP FOLLOW UP RECOMMENDATIONS 05/12/2020 Follow  up Recommendations Inpatient Rehab   CHL IP FREQUENCY AND DURATION 05/12/2020 Speech Therapy Frequency (ACUTE ONLY) min 2x/week Treatment Duration 2 weeks      CHL IP ORAL PHASE 05/12/2020 Oral Phase Impaired Oral - Pudding Teaspoon -- Oral - Pudding Cup -- Oral - Honey Teaspoon -- Oral - Honey Cup -- Oral - Nectar Teaspoon -- Oral - Nectar Cup Lingual pumping;Delayed oral transit;Weak lingual manipulation;Reduced posterior propulsion Oral - Nectar Straw -- Oral - Thin Teaspoon -- Oral - Thin Cup Left anterior bolus loss;Decreased bolus cohesion;Left pocketing in lateral sulci Oral - Thin Straw -- Oral - Puree WFL Oral - Mech Soft -- Oral - Regular Delayed oral transit;Weak lingual manipulation;Impaired mastication Oral - Multi-Consistency -- Oral - Pill -- Oral Phase - Comment --  CHL IP PHARYNGEAL  PHASE 05/12/2020 Pharyngeal Phase Impaired Pharyngeal- Pudding Teaspoon -- Pharyngeal -- Pharyngeal- Pudding Cup -- Pharyngeal -- Pharyngeal- Honey Teaspoon -- Pharyngeal -- Pharyngeal- Honey Cup -- Pharyngeal -- Pharyngeal- Nectar Teaspoon -- Pharyngeal -- Pharyngeal- Nectar Cup Delayed swallow initiation-pyriform sinuses;Delayed swallow initiation-vallecula;Pharyngeal residue - valleculae Pharyngeal -- Pharyngeal- Nectar Straw -- Pharyngeal -- Pharyngeal- Thin Teaspoon -- Pharyngeal -- Pharyngeal- Thin Cup Penetration/Aspiration before swallow;Compensatory strategies attempted (with notebox) Pharyngeal Material enters airway, passes BELOW cords without attempt by patient to eject out (silent aspiration);Material enters airway, passes BELOW cords and not ejected out despite cough attempt by patient Pharyngeal- Thin Straw -- Pharyngeal -- Pharyngeal- Puree WFL Pharyngeal -- Pharyngeal- Mechanical Soft -- Pharyngeal -- Pharyngeal- Regular WFL Pharyngeal -- Pharyngeal- Multi-consistency -- Pharyngeal -- Pharyngeal- Pill -- Pharyngeal -- Pharyngeal Comment --  CHL IP CERVICAL ESOPHAGEAL PHASE 05/12/2020 Cervical Esophageal Phase WFL Pudding Teaspoon -- Pudding Cup -- Honey Teaspoon -- Honey Cup -- Nectar Teaspoon -- Nectar Cup -- Nectar Straw -- Thin Teaspoon -- Thin Cup -- Thin Straw -- Puree -- Mechanical Soft -- Regular -- Multi-consistency -- Pill -- Cervical Esophageal Comment -- [place] Siren 05/12/2020, 4:48 PM  Orbie Pyo Litaker M.Ed Actor Pager [phone] Office [phone]             ECHOCARDIOGRAM COMPLETE  Result Date: 05/12/2020    ECHOCARDIOGRAM REPORT   Patient Name:   BURNARD ENIS Date of Exam: 05/12/2020 Medical Rec #:  [id number]       Height:       69.0 in Accession #:    [id number]      Weight:       220.2 lb Date of Birth:  [birth year]      BSA:          2.152 m Patient Age:    82 years        BP:           106/56 mmHg Patient Gender: M               HR:            74 bpm. Exam Location:  Inpatient Procedure: 2D Echo, Cardiac Doppler and Color Doppler Indications:    CVA  History:        Patient has no prior history of Echocardiogram examinations.                 Signs/Symptoms:CKD; Risk Factors:Hypertension, Dyslipidemia,                 Diabetes and Former Smoker.  Sonographer:    Dustin Flock Referring Phys: [place]  1. Left ventricular ejection fraction, by estimation,  is 70 to 75%. The left ventricle has hyperdynamic function. The left ventricle has no regional wall motion abnormalities. There is mild concentric left ventricular hypertrophy. Left ventricular diastolic parameters were normal.  2. Right ventricular systolic function is normal. The right ventricular size is normal.  3. The mitral valve is normal in structure. Trivial mitral valve regurgitation.  4. The aortic valve is tricuspid. There is mild thickening of the aortic valve. Aortic valve regurgitation is not visualized. No aortic stenosis is present.  5. Aortic dilatation noted. There is mild dilatation of the aortic root, measuring 40 mm. Comparison(s): No prior Echocardiogram. Conclusion(s)/Recommendation(s): No intracardiac source of embolism detected on this transthoracic study. A transesophageal echocardiogram is recommended to exclude cardiac source of embolism if clinically indicated. FINDINGS  Left Ventricle: Left ventricular ejection fraction, by estimation, is 70 to 75%. The left ventricle has hyperdynamic function. The left ventricle has no regional wall motion abnormalities. The left ventricular internal cavity size was normal in size. There is mild concentric left ventricular hypertrophy. Left ventricular diastolic parameters were normal. Right Ventricle: The right ventricular size is normal. Right vetricular wall thickness was not well visualized. Right ventricular systolic function is normal. Left Atrium: Left atrial size was normal in size. Right Atrium: Right  atrial size was normal in size. Pericardium: There is no evidence of pericardial effusion. Mitral Valve: The mitral valve is normal in structure. Trivial mitral valve regurgitation. Tricuspid Valve: The tricuspid valve is normal in structure. Tricuspid valve regurgitation is trivial. Aortic Valve: The aortic valve is tricuspid. There is mild thickening of the aortic valve. Aortic valve regurgitation is not visualized. No aortic stenosis is present. Pulmonic Valve: The pulmonic valve was normal in structure. Pulmonic valve regurgitation is trivial. Aorta: Aortic dilatation noted. There is mild dilatation of the aortic root, measuring 40 mm. IAS/Shunts: No atrial level shunt detected by color flow Doppler.  LEFT VENTRICLE PLAX 2D LVIDd:         4.25 cm  Diastology LVIDs:         2.00 cm  LV e' medial:    7.18 cm/s LV PW:         0.90 cm  LV E/e' medial:  10.3 LV IVS:        1.05 cm  LV e' lateral:   9.57 cm/s LVOT diam:     2.00 cm  LV E/e' lateral: 7.7 LV SV:         71 LV SV Index:   33 LVOT Area:     3.14 cm  RIGHT VENTRICLE RV Basal diam:  3.40 cm RV S prime:     17.90 cm/s TAPSE (M-mode): 3.3 cm LEFT ATRIUM             Index       RIGHT ATRIUM           Index LA diam:        3.50 cm 1.63 cm/m  RA Area:     16.00 cm LA Vol (A2C):   47.9 ml 22.25 ml/m RA Volume:   42.30 ml  19.65 ml/m LA Vol (A4C):   33.7 ml 15.66 ml/m LA Biplane Vol: 41.9 ml 19.47 ml/m  AORTIC VALVE LVOT Vmax:   127.00 cm/s LVOT Vmean:  82.500 cm/s LVOT VTI:    0.225 m  AORTA Ao Root diam: 3.50 cm MITRAL VALVE MV Area (PHT): 4.21 cm    SHUNTS MV Decel Time: 180 msec    Systemic VTI:  0.22 m  MV E velocity: 74.10 cm/s  Systemic Diam: 2.00 cm MV A velocity: 75.00 cm/s MV E/A ratio:  0.99 Gwyndolyn Kaufman MD Electronically signed by Gwyndolyn Kaufman MD Signature Date/Time: 05/12/2020/12:59:11 PM    Final    IR PERCUTANEOUS ART THROMBECTOMY/INFUSION INTRACRANIAL INC DIAG ANGIO  Result Date: 05/12/2020 INDICATION: New onset of right gaze  deviation and left sided weakness arm greater than leg. 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 EXAM: 1. EMERGENT LARGE VESSEL OCCLUSION THROMBOLYSIS (anterior CIRCULATION) COMPARISON:  CT angiogram of head and neck of May 10, 2020. MEDICATIONS: Ancef 2 g IV antibiotic was administered within 1 hour of the procedure. ANESTHESIA/SEDATION: General anesthesia CONTRAST:  Isovue 300 approximately 100 mL. FLUOROSCOPY TIME:  Fluoroscopy Time: 79 minutes 24 seconds (2346 mGy). COMPLICATIONS: None immediate. TECHNIQUE: Following a full explanation of the procedure along with the potential associated complications, an informed witnessed consent was obtained. The risks of intracranial hemorrhage of 10%, worsening neurological deficit, ventilator dependency, death and inability to revascularize were all reviewed in detail with the patient's spouse. The patient was then put under general anesthesia by the Department of Anesthesiology at [facility]. The right groin was prepped and draped in the usual sterile fashion. Thereafter using modified Seldinger technique, transfemoral access into the right common femoral artery was obtained without difficulty. Over a 0.035 inch guidewire an 8 [place] Pinnacle 25 cm sheath was inserted. Through this, and also over a 0.035 inch guidewire a combination of a 5.5 French Simmons 2 catheter inside of the 087 balloon guide catheter was advanced over a 0.035 inch Roadrunner guidewire and positioned without difficulty into the right common carotid artery. The guidewire was removed. Good aspiration was obtained from the hub of the balloon guide catheter. FINDINGS: A gentle control arteriogram performed through the balloon guide catheter demonstrated no evidence of spasms, dissections or of intraluminal filling defects. The right common carotid arteriogram demonstrates wide patency of the right external carotid artery and its major branches.  The right internal carotid artery at the bulb to the cranial skull base demonstrates wide patency. The petrous, the cavernous and supraclinoid segments are widely patent. The right middle cerebral artery and the right anterior cerebral artery are seen to opacify into the capillary and venous phases. 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 Right posterior communicating artery is seen opacifying the right posterior cerebral artery distribution. PROCEDURE: Through the 087 balloon guide catheter in the right common carotid artery, a combination of an 055 136 Zoom aspiration catheter with an 035 Zoom aspiration catheter was advanced over a 0.014 inch standard Synchro micro guidewire with a J configuration to the right M1 segment. Using a torque device, access was obtained to just proximal to the occluded superior division M2 segment. Access to the distal M3 M4 inferior division branch was obtained just distal to the occluded site. However, advancement of the 035 Zoom aspiration catheter was met with significant resistance with change in configuration distally. The micro guidewire, and the 035 Zoom aspiration catheter were removed. The 055 Zoom aspiration was advanced to be just distal to the occluded site of the superior division. Constant aspiration was then performed via a Penumbra aspiration device for approximately 2 minutes. Thereafter, the 055 Zoom aspiration was retrieved and removed. A control arteriogram performed through the balloon guide in the distal right ICA continued to demonstrate no change in the occluded superior division  prominent branch. No clot was noted in the aspirate or within the 055 aspiration catheter. Another pass was then made again using the 055 aspiration catheter with a 162 021 microcatheter over a 0.014 inch standard Synchro micro guidewire. This combination was again advanced as described earlier and  positioned at the site of the occlusion. The micro guidewire was replaced with an 016 Fathom micro guidewire. This advanced easily through the occluded superior division occluded branch into the M4 region. This was then followed by the microcatheter. The guidewire was removed. Good aspiration obtained from the hub of the microcatheter. A gentle control arteriogram performed through the microcatheter demonstrated safe position of the tip of the microcatheter which was then connected to continuous heparinized saline infusion. A 3 mm x 40 mm Solitaire X retrieval device was then advanced to the distal end of the microcatheter and deployed in the usual manner without difficulty. The microcatheter was retrieved more proximally into the right internal carotid artery. The 055 Zoom aspiration was then advanced abutting against the occluded superior division M2 segment. With proximal flow arrest in the right internal carotid artery, and continuous aspiration with a 20 mL syringe at the hub of the balloon guide catheter, and constant aspiration applied at the hub of the 055 Zoom aspiration catheter for approximately 2-1/2 minutes, the combination of the retrieval device, the microcatheter and the Zoom 055 aspiration catheter were retrieved and removed. Thick sticky clot was seen entangled within the retrieval device, and also in the hub of the Tuohy Hankins. A control arteriogram performed following reversal of flow arrest in the right internal carotid artery demonstrated now complete revascularization of 2 of the 3 main branches emanating from the superior division. However, there continued be a distal M3 occlusion of an anterior perisylvian branch leading on up to the hyper perfused subcortical frontal perisylvian region seen on the CT perfusion mapping studies. A TICI 2C revascularization was achieved. 200 mcg of nitroglycerin was then infused through the balloon guide catheter in the right internal carotid artery to obviate  mild to moderate spasm at the proximal superior division of the right MCA. A final control arteriogram performed through the balloon guide in the right common carotid artery continued to demonstrate patency of the extracranial, and intracranial right ICA, a TICI 2C revascularization of the right middle cerebral artery distribution. The right anterior cerebral artery and right posterior communicating artery demonstrate wide patency unchanged. The balloon guide was removed. An 8 French Angio-Seal closure device was applied for hemostasis at the right groin puncture site. Distal pulses were Dopplerable in both feet unchanged. A CT of the brain demonstrated focal area of hyperattenuation in the putamen on the right side associated with moderate hyperattenuation in the posterior right perisylvian subarachnoid space. No evidence of mass or midline shift was noted. The patient was left intubated for airway protection. He was then transferred to the neuro ICU for post embolization management. IMPRESSION: Status post endovascular near complete revascularization of occluded superior division of the right middle cerebral artery with 1 pass with contact aspiration, and 1 pass with contact aspiration with a 3 mm x 40 mm Solitaire retrieval device achieving a TICI 2C revascularization. PLAN: Follow-up with the referring MD. Electronically Signed   By: Luanne Bras M.D.   On: 05/11/2020 10:58   VAS [place] LOWER EXTREMITY VENOUS (DVT)  Result Date: 05/12/2020  Lower Venous DVT Study Indications: Swelling and erythema LT>RT, s/p stroke.  Limitations: S/P RT cath. Comparison Study: No prior studies. Performing  Technologist: Darlin Coco RDMS,RVT  Examination Guidelines: A complete evaluation includes B-mode imaging, spectral Doppler, color Doppler, and power Doppler as needed of all accessible portions of each vessel. Bilateral testing is considered an integral part of a complete examination. Limited examinations for  reoccurring indications may be performed as noted. The reflux portion of the exam is performed with the patient in reverse Trendelenburg.  +---------+---------------+---------+-----------+----------+-------------------+ RIGHT    CompressibilityPhasicitySpontaneityPropertiesThrombus Aging      +---------+---------------+---------+-----------+----------+-------------------+ CFV      Full           Yes      Yes                                      +---------+---------------+---------+-----------+----------+-------------------+ SFJ                                                   Limited visibility                                                        due to recent cath  +---------+---------------+---------+-----------+----------+-------------------+ FV Prox  Full                                                             +---------+---------------+---------+-----------+----------+-------------------+ FV Mid   Full                                                             +---------+---------------+---------+-----------+----------+-------------------+ FV DistalFull                                                             +---------+---------------+---------+-----------+----------+-------------------+ PFV      Full                                                             +---------+---------------+---------+-----------+----------+-------------------+ POP      Full           Yes      Yes                                      +---------+---------------+---------+-----------+----------+-------------------+ PTV      Full                                                             +---------+---------------+---------+-----------+----------+-------------------+  PERO     Full                                                             +---------+---------------+---------+-----------+----------+-------------------+    +---------+---------------+---------+-----------+----------+--------------+ LEFT     CompressibilityPhasicitySpontaneityPropertiesThrombus Aging +---------+---------------+---------+-----------+----------+--------------+ CFV      Full           Yes      Yes                                 +---------+---------------+---------+-----------+----------+--------------+ SFJ      Full                                                        +---------+---------------+---------+-----------+----------+--------------+ FV Prox  Full                                                        +---------+---------------+---------+-----------+----------+--------------+ FV Mid   Full                                                        +---------+---------------+---------+-----------+----------+--------------+ FV DistalFull                                                        +---------+---------------+---------+-----------+----------+--------------+ PFV      Full                                                        +---------+---------------+---------+-----------+----------+--------------+ POP      Full           Yes      Yes                                 +---------+---------------+---------+-----------+----------+--------------+ PTV      Full                                                        +---------+---------------+---------+-----------+----------+--------------+ PERO     Full                                                        +---------+---------------+---------+-----------+----------+--------------+  Summary: RIGHT: - There is no evidence of deep vein thrombosis in the lower extremity.  - No cystic structure found in the popliteal fossa.  LEFT: - There is no evidence of deep vein thrombosis in the lower extremity.  - No cystic structure found in the popliteal fossa.  *See table(s) above for measurements and observations. Electronically signed  by Monica Martinez MD on 05/12/2020 at 2:16:36 PM.    Final    CT ANGIO HEAD CODE STROKE  Addendum Date: 05/10/2020   ADDENDUM REPORT: 05/10/2020 15:20 ADDENDUM: These results were called by telephone at the time of interpretation on 05/10/2020 at 3:20 pm to provider [facility] , who verbally acknowledged these results. Electronically Signed   By: Franchot Gallo M.D.   On: 05/10/2020 15:20   Result Date: 05/10/2020 CLINICAL DATA:  Stroke. EXAM: CT ANGIOGRAPHY HEAD AND NECK CT PERFUSION BRAIN TECHNIQUE: Multidetector CT imaging of the head and neck was performed using the standard protocol during bolus administration of intravenous contrast. Multiplanar CT image reconstructions and MIPs were obtained to evaluate the vascular anatomy. Carotid stenosis measurements (when applicable) are obtained utilizing NASCET criteria, using the distal internal carotid diameter as the denominator. Multiphase CT imaging of the brain was performed following IV bolus contrast injection. Subsequent parametric perfusion maps were calculated using RAPID software. CONTRAST:  140m OMNIPAQUE IOHEXOL 350 MG/ML SOLN COMPARISON:  CT head 05/10/2020.  MRI and MRA head 05/10/2020 FINDINGS: CTA NECK FINDINGS Aortic arch: Standard branching. Imaged portion shows no evidence of aneurysm or dissection. No significant stenosis of the major arch vessel origins. Right carotid system: Mild atherosclerotic calcification right carotid bifurcation without significant stenosis. No irregularity or thrombus in the right carotid. Left carotid system: Mild atherosclerotic disease left carotid without stenosis or irregularity. Vertebral arteries: Both vertebral arteries widely patent. Skeleton: Disc degeneration and spurring. Foraminal narrowing due to spurring bilaterally at C5-6 and C6-7. No acute skeletal abnormality. Other neck: No mass or edema identified. Upper chest: Lung apices clear bilaterally. Review of the MIP images confirms the above  findings CTA HEAD FINDINGS Anterior circulation: Cavernous carotid widely patent bilaterally. Anterior cerebral arteries widely patent. Left middle cerebral artery widely patent. Right M1 segment widely patent. There is occlusion of a large right M3 branch superior division right MCA as noted on prior MRA. This appears acute. Posterior circulation: Normal posterior circulation. No stenosis or large vessel occlusion. Venous sinuses: Normal venous enhancement. Anatomic variants: None Review of the MIP images confirms the above findings CT Brain Perfusion Findings: ASPECTS: 10 CBF (<30%) Volume: 223mPerfusion (Tmax>6.0s) volume: 6239mismatch Volume: 52m36mfarction Location:Right insula and right operculum. IMPRESSION: 1. Abnormal CT perfusion. 29 mm core infarct with additional 33 mL of surrounding penumbra in the right insula and operculum corresponding to restricted diffusion on MRI. 2. Occluded right superior branch of M3 which is a relatively large vessel compatible with an embolus. 3. No significant carotid or vertebral artery stenosis in the neck. Electronically Signed: By: CharFranchot Gallo. On: 05/10/2020 15:15   CT ANGIO NECK CODE STROKE  Addendum Date: 05/10/2020   ADDENDUM REPORT: 05/10/2020 15:20 ADDENDUM: These results were called by telephone at the time of interpretation on 05/10/2020 at 3:20 pm to [facility] these results. Electronically Signed   By: CharFranchot Gallo.   On: 05/10/2020 15:20   Result Date: 05/10/2020 CLINICAL DATA:  Stroke. EXAM: CT ANGIOGRAPHY HEAD AND NECK CT PERFUSION BRAIN TECHNIQUE: Multidetector CT imaging of the head and  neck was performed using the standard protocol during bolus administration of intravenous contrast. Multiplanar CT image reconstructions and MIPs were obtained to evaluate the vascular anatomy. Carotid stenosis measurements (when applicable) are obtained utilizing NASCET criteria, using the distal internal  carotid diameter as the denominator. Multiphase CT imaging of the brain was performed following IV bolus contrast injection. Subsequent parametric perfusion maps were calculated using RAPID software. CONTRAST:  158m OMNIPAQUE IOHEXOL 350 MG/ML SOLN COMPARISON:  CT head 05/10/2020.  MRI and MRA head 05/10/2020 FINDINGS: CTA NECK FINDINGS Aortic arch: Standard branching. Imaged portion shows no evidence of aneurysm or dissection. No significant stenosis of the major arch vessel origins. Right carotid system: Mild atherosclerotic calcification right carotid bifurcation without significant stenosis. No irregularity or thrombus in the right carotid. Left carotid system: Mild atherosclerotic disease left carotid without stenosis or irregularity. Vertebral arteries: Both vertebral arteries widely patent. Skeleton: Disc degeneration and spurring. Foraminal narrowing due to spurring bilaterally at C5-6 and C6-7. No acute skeletal abnormality. Other neck: No mass or edema identified. Upper chest: Lung apices clear bilaterally. Review of the MIP images confirms the above findings CTA HEAD FINDINGS Anterior circulation: Cavernous carotid widely patent bilaterally. Anterior cerebral arteries widely patent. Left middle cerebral artery widely patent. Right M1 segment widely patent. There is occlusion of a large right M3 branch superior division right MCA as noted on prior MRA. This appears acute. Posterior circulation: Normal posterior circulation. No stenosis or large vessel occlusion. Venous sinuses: Normal venous enhancement. Anatomic variants: None Review of the MIP images confirms the above findings CT Brain Perfusion Findings: ASPECTS: 10 CBF (<30%) Volume: 217mPerfusion (Tmax>6.0s) volume: 6274mismatch Volume: 26m38mfarction Location:Right insula and right operculum. IMPRESSION: 1. Abnormal CT perfusion. 29 mm core infarct with additional 33 mL of surrounding penumbra in the right insula and operculum corresponding to  restricted diffusion on MRI. 2. Occluded right superior branch of M3 which is a relatively large vessel compatible with an embolus. 3. No significant carotid or vertebral artery stenosis in the neck. Electronically Signed: By: CharFranchot Gallo. 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  He has plans to go to CIR today.  To this point, no cause has been found.  Review of ECGs and telemetry showed no evidence of atrial fibrillation.  Due to that, Linq monitor implant would be recommended.  Risks and benefits have been discussed with the patient.  Risks include bleeding and infection.  The patient understands these risks and has agreed to the procedure.    Signed, Zuzu Befort MartMeredith Leeds  05/14/2020 11:14 AM

## 2020-05-14 NOTE — Discharge Summary (Addendum)
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   amLODipine 2.5 MG tablet Commonly known as: NORVASC Take 2.5 mg by mouth daily.   aspirin 81 MG chewable tablet Place 4 tablets (324 mg total) into feeding tube daily. Start taking on: May 15, 2020 Replaces: aspirin EC 81 MG tablet   atorvastatin 40 MG tablet Commonly known as: LIPITOR Place 1 tablet (40 mg total) into feeding tube daily. Start taking on: May 15, 2020   BD Pen Needle Nano U/F 32G X 4 MM Misc Generic drug: Insulin Pen Needle USE WITH INJECTIONS 2 TIMES DAILY   calcitRIOL 0.25 MCG capsule Commonly known as: ROCALTROL Take 0.25 mcg by mouth daily.   Cholecalciferol 50 MCG (2000 UT) Caps Take 2,000 Units by mouth daily.   clopidogrel 75 MG tablet Commonly known as: PLAVIX Place 1 tablet (75 mg total) into feeding tube daily. Start taking on: May 15, 2020   colestipol 1 g tablet Commonly known as: COLESTID Take 1 g by mouth 2 (two) times daily.   cyanocobalamin 1000 MCG tablet Take 1,000 mcg by mouth daily. What  changed: Another medication with the same name was removed. Continue taking this medication, and follow the directions you see here.   glipiZIDE 10 MG 24 hr tablet Commonly known as: GLUCOTROL XL Take 10 mg by mouth at bedtime.   hydrochlorothiazide 12.5 MG tablet Commonly known as: HYDRODIURIL Take 12.5 mg by mouth daily.   insulin aspart 100 UNIT/ML injection Commonly known as: novoLOG Inject 26 Units into the skin AC breakfast. take 26 units before breakfast and 22 units before supper   insulin aspart protamine - aspart (70-30) 100 UNIT/ML FlexPen Commonly known as: NOVOLOG 70/30 MIX 42-52 Units. 42 units qam and 52 qhs   lisinopril 40 MG tablet Commonly known as: ZESTRIL Take 40 mg by mouth daily. Take 20 mg twice a day   metoprolol succinate 100 MG 24 hr tablet Commonly known as: TOPROL-XL Take 100 mg by mouth daily. Take with or immediately following a meal.   OneTouch Ultra test strip Generic drug: glucose blood Use 2 (two) times daily Dx E11.9   pantoprazole 40 MG tablet Commonly known as: PROTONIX Take 40 mg by mouth daily.   pregabalin 75 MG capsule Commonly known as: LYRICA Take 75 mg by mouth 2 (two) times daily.       LABORATORY STUDIES CBC  Component Value Date/Time   WBC 10.3 05/14/2020 0209   RBC 3.73 (L) 05/14/2020 0209   HGB 11.7 (L) 05/14/2020 0209   HGB 16.0 02/08/2013 0532   HCT 35.7 (L) 05/14/2020 0209   HCT 46.2 02/08/2013 0532   PLT 194 05/14/2020 0209   PLT 269 02/08/2013 0532   MCV 95.7 05/14/2020 0209   MCV 91 02/08/2013 0532   MCH 31.4 05/14/2020 0209   MCHC 32.8 05/14/2020 0209   RDW 13.2 05/14/2020 0209   RDW 13.1 02/08/2013 0532   LYMPHSABS 0.7 05/11/2020 0405   LYMPHSABS 2.0 02/08/2013 0532   MONOABS 1.0 05/11/2020 0405   MONOABS 1.0 02/08/2013 0532   EOSABS 0.0 05/11/2020 0405   EOSABS 0.2 02/08/2013 0532   BASOSABS 0.0 05/11/2020 0405   BASOSABS 0.0 02/08/2013 0532   CMP    Component Value Date/Time   NA 140  05/14/2020 0209   NA 136 02/08/2013 0532   K 3.5 05/14/2020 0209   K 4.0 02/08/2013 0532   CL 109 05/14/2020 0209   CL 107 02/08/2013 0532   CO2 27 05/14/2020 0209   CO2 24 02/08/2013 0532   GLUCOSE 176 (H) 05/14/2020 0209   GLUCOSE 179 (H) 02/08/2013 0532   BUN 24 (H) 05/14/2020 0209   BUN 16 02/08/2013 0532   CREATININE 1.82 (H) 05/14/2020 0209   CREATININE 1.04 02/08/2013 0532   CALCIUM 8.2 (L) 05/14/2020 0209   CALCIUM 8.9 02/08/2013 0532   PROT 5.7 (L) 05/12/2020 0444   PROT 7.2 02/08/2013 0532   ALBUMIN 3.3 (L) 05/12/2020 0444   ALBUMIN 3.7 02/08/2013 0532   AST 15 05/12/2020 0444   AST 24 02/08/2013 0532   ALT 11 05/12/2020 0444   ALT 27 02/08/2013 0532   ALKPHOS 43 05/12/2020 0444   ALKPHOS 72 02/08/2013 0532   BILITOT 0.9 05/12/2020 0444   BILITOT 0.3 02/08/2013 0532   GFRNONAA 38 (L) 05/14/2020 0209   GFRNONAA >60 02/08/2013 0532   GFRAA >60 02/08/2013 0532   COAGS Lab Results  Component Value Date   INR 1.0 05/10/2020   Lipid Panel    Component Value Date/Time   CHOL 166 05/11/2020 0405   TRIG 208 (H) 05/11/2020 0405   TRIG 204 (H) 05/11/2020 0405   HDL 33 (L) 05/11/2020 0405   CHOLHDL 5.0 05/11/2020 0405   VLDL 42 (H) 05/11/2020 0405   LDLCALC 91 05/11/2020 0405   HgbA1C  Lab Results  Component Value Date   HGBA1C 8.2 (H) 05/11/2020   Urinalysis No results found for: COLORURINE, APPEARANCEUR, LABSPEC, PHURINE, GLUCOSEU, HGBUR, BILIRUBINUR, KETONESUR, PROTEINUR, UROBILINOGEN, NITRITE, LEUKOCYTESUR Urine Drug Screen No results found for: LABOPIA, COCAINSCRNUR, LABBENZ, AMPHETMU, THCU, LABBARB  Alcohol Level No results found for: [facility]   SIGNIFICANT DIAGNOSTIC STUDIES CT Head Wo Contrast  Result Date: 05/11/2020 CLINICAL DATA:  75 year old male status post code stroke presentation yesterday with right M3 occlusion status post endovascular revascularization. Contrast staining, subarachnoid contrast/blood intraoperatively. Subsequent encounter.  EXAM: CT HEAD WITHOUT CONTRAST TECHNIQUE: Contiguous axial images were obtained from the base of the skull through the vertex without intravenous contrast. COMPARISON:  NIR flat panel head CT 1758 hours on 05/10/2020. Presentation plain head CT 0942 hours 05/10/2020. FINDINGS: Brain: Small volume subarachnoid hemorrhage along the right sylvian fissure (series 3, image 18), new from the presentation CT yesterday but regressed compared to 1758 hours yesterday. No associated intraventricular hemorrhage or ventriculomegaly. Normal basilar cisterns. No midline shift. Roughly 4 cm area of cytotoxic edema is evident now at  the right frontal operculum on series 3, image 21. No intra-axial hemorrhage identified. Stable gray-white matter differentiation elsewhere. Vascular: Some residual intravascular contrast is suspected. Skull: No acute osseous abnormality identified. Sinuses/Orbits: Visualized paranasal sinuses and mastoids are stable and well aerated. Other: Endotracheal tube partially visible in the oral cavity. No acute orbit or scalp soft tissue finding. IMPRESSION: 1. Small volume subarachnoid hemorrhage along the right Sylvian fissure is regressed compared to 1758 hours yesterday. 2. Cytotoxic edema now visible at the right frontal operculum. But no malignant hemorrhagic transformation. No significant intracranial mass effect. 3. Preliminary report of the above discussed by telephone with Dr. Quinn Axe on 05/11/2020 at 0552 hours. Electronically Signed   By: Genevie Ann M.D.   On: 05/11/2020 06:05   CT HEAD WO CONTRAST  Result Date: 05/10/2020 CLINICAL DATA:  Discoordination and slurred speech with fall EXAM: CT HEAD WITHOUT CONTRAST TECHNIQUE: Contiguous axial images were obtained from the base of the skull through the vertex without intravenous contrast. COMPARISON:  None. FINDINGS: Brain: There is mild diffuse atrophy. There is no appreciable intracranial mass, hemorrhage, extra-axial fluid collection, or midline  shift. The brain parenchyma appears unremarkable. No acute infarct is demonstrable on this study. Vascular: There is no hyperdense vessel. There is slight calcification in the carotid siphon regions bilaterally. Skull: Bony calvarium appears intact. Sinuses/Orbits: There is mucosal thickening in the medial superior left maxillary antrum. There is slight mucosal thickening in several ethmoid air cells. Orbits appear symmetric bilaterally. Other: Visualized mastoid air cells clear. IMPRESSION: Mild atrophy. Normal appearing brain parenchyma. No acute infarct demonstrated by CT. No mass or hemorrhage. Slight arterial vascular calcification noted. Areas of paranasal sinus mucosal thickening noted. Electronically Signed   By: Lowella Grip III M.D.   On: 05/10/2020 10:07   MR ANGIO HEAD WO CONTRAST  Result Date: 05/11/2020 CLINICAL DATA:  Stroke, follow-up. EXAM: MRI HEAD WITHOUT CONTRAST MRA HEAD WITHOUT CONTRAST TECHNIQUE: Multiplanar, multiecho pulse sequences of the brain and surrounding structures were obtained without intravenous contrast. Angiographic images of the head were obtained using MRA technique without contrast. COMPARISON:  Head CT 05/11/2020. Images from thrombectomy 05/10/2020. Noncontrast head CT, CT angiogram head/neck and CT perfusion 05/10/2020. MRI/MRA head 05/10/2020. Small chronic infarct within the right cerebellar hemisphere. FINDINGS: MRI HEAD FINDINGS Brain: Mild-to-moderate cerebral atrophy. Comparatively mild cerebellar atrophy. Acute cortical/subcortical infarct within the right MCA vascular territory. The dominant component of the infarct is present within the right frontal operculum and right insula and now measures 4.7 x 3.9 x 4.2 cm. However, there are additional cortical/subcortical acute infarcts more superiorly and posteriorly within the right frontal lobe and right parietal lobe (with involvement of the pre and postcentral gyri). Additional small acute infarcts are also  present within the right caudate nucleus and right frontal lobe more anteriorly. SWI signal loss along the right sylvian fissure, likely reflecting a combination of petechial hemorrhage and the previously demonstrated small-volume acute subarachnoid hemorrhage at this site. Additional petechial hemorrhage within the right caudate nucleus infarct. Additional petechial hemorrhage versus chronic microhemorrhages within the right frontal operculum and right frontal lobe white matter. There is no significant mass effect at this time. No midline shift. Small acute cortically based infarct within the right occipital lobe (PCA vascular territory). Punctate acute infarct within the left occipital lobe (PCA vascular territory) (series 5, image 70). Background mild multifocal T2/FLAIR hyperintensity within the cerebral white matter is nonspecific, but compatible with chronic small vessel ischemic disease. Redemonstrated small chronic infarct within the right cerebellar  hemisphere. No evidence of intracranial mass. No extra-axial fluid collection. Vascular: Expected proximal arterial flow voids. Skull and upper cervical spine: No focal marrow lesion. Sinuses/Orbits: Visualized orbits show no acute finding. Trace scattered paranasal sinus mucosal thickening at the imaged levels. MRA HEAD FINDINGS: The intracranial internal carotid arteries are patent. The M1 middle cerebral arteries are patent. High-grade focal stenosis with possible short-segment flow gap at site of previously demonstrated mid to distal right M2 MCA branch occlusion (series 10, images 125-134) (series 1081, image 3). The anterior cerebral arteries are patent. The intracranial vertebral arteries are patent. The basilar artery is patent. The posterior cerebral arteries are patent. A right posterior communicating artery is present. The left posterior communicating artery is hypoplastic or absent. No intracranial aneurysm is identified. IMPRESSION: MRI brain: 1.  Acute right MCA vascular territory infarct as described. This has significantly increased in extent as compared to the brain MRI of 05/10/2020, and may have subtly increased in extent as compared to the head CT performed earlier today. No significant mass effect at this time 2. SWI signal loss along the right sylvian fissure which likely reflects a combination of petechial hemorrhage and the small-volume acute subarachnoid hemorrhage previously demonstrated at this site. 3. Small acute cortically-based infarct within the right occipital lobe (PCA vascular territory). 4. Punctate acute cortical infarct within the left occipital lobe (PCA vascular territory). 5. Background chronic findings without interval change, as described. MRA head: 1. High-grade focal stenosis with possible short-segment flow gap at site of previously demonstrated mid-to-distal right M2 MCA branch occlusion. 2. Elsewhere, no intracranial large vessel occlusion or proximal high-grade arterial stenosis is identified. Electronically Signed   By: Kellie Simmering DO   On: 05/11/2020 18:10   MR ANGIO HEAD WO CONTRAST  Result Date: 05/10/2020 CLINICAL DATA:  TIA. Dysarthria and slurred speech. Mildly weak on the left. EXAM: MRI HEAD WITHOUT CONTRAST MRA HEAD WITHOUT CONTRAST TECHNIQUE: Multiplanar, multiecho pulse sequences of the brain and surrounding structures were obtained without intravenous contrast. Angiographic images of the head were obtained using MRA technique without contrast. COMPARISON:  CT head 05/10/2020 FINDINGS: MRI HEAD FINDINGS Brain: Acute infarct in the right insular cortex and right parietal operculum. There is FLAIR hyperintensity in the right MCA branches in this area due to slow flow or thrombosis. There is susceptibility in the right sylvian fissure compatible with thrombus in the right MCA branch. Mild atrophy. Mild chronic microvascular ischemic change in the white matter. 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  Negative for mass lesion. Vascular: Normal arterial flow voids at the base of brain. Susceptibility in the right sylvian fissure with FLAIR hyperintensity in the right MCA vessels compatible with slow flow or occlusion Skull and upper cervical spine: Negative Sinuses/Orbits: Mild mucosal edema paranasal sinuses. Bilateral cataract extraction Other: None MRA HEAD FINDINGS Internal carotid artery widely patent bilaterally without stenosis. Anterior cerebral arteries widely patent with mild irregularity. Left middle cerebral artery widely patent with mild irregularity. Right M1 segment patent. Occlusion of the superior division of the right M3 vessel. Inferior division right M2 widely patent. Posterior circulation normal without significant stenosis. Fetal origin right posterior cerebral artery. IMPRESSION: 1. 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 2. Mild chronic microvascular ischemic change in the white matter 3. 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   On: 05/10/2020 13:52   MR BRAIN WO CONTRAST  Result Date: 05/11/2020 CLINICAL DATA:  Stroke, follow-up. EXAM: MRI  HEAD WITHOUT CONTRAST MRA HEAD WITHOUT CONTRAST TECHNIQUE: Multiplanar, multiecho pulse sequences of the brain and surrounding structures were obtained without intravenous contrast. Angiographic images of the head were obtained using MRA technique without contrast. COMPARISON:  Head CT 05/11/2020. Images from thrombectomy 05/10/2020. Noncontrast head CT, CT angiogram head/neck and CT perfusion 05/10/2020. MRI/MRA head 05/10/2020. Small chronic infarct within the right cerebellar hemisphere. FINDINGS: MRI HEAD FINDINGS Brain: Mild-to-moderate cerebral atrophy. Comparatively mild cerebellar atrophy. Acute cortical/subcortical infarct within the right MCA vascular territory. The dominant component of the infarct is present within the right frontal operculum and right insula and now  measures 4.7 x 3.9 x 4.2 cm. However, there are additional cortical/subcortical acute infarcts more superiorly and posteriorly within the right frontal lobe and right parietal lobe (with involvement of the pre and postcentral gyri). Additional small acute infarcts are also present within the right caudate nucleus and right frontal lobe more anteriorly. SWI signal loss along the right sylvian fissure, likely reflecting a combination of petechial hemorrhage and the previously demonstrated small-volume acute subarachnoid hemorrhage at this site. Additional petechial hemorrhage within the right caudate nucleus infarct. Additional petechial hemorrhage versus chronic microhemorrhages within the right frontal operculum and right frontal lobe white matter. There is no significant mass effect at this time. No midline shift. Small acute cortically based infarct within the right occipital lobe (PCA vascular territory). Punctate acute infarct within the left occipital lobe (PCA vascular territory) (series 5, image 70). Background mild multifocal T2/FLAIR hyperintensity within the cerebral white matter is nonspecific, but compatible with chronic small vessel ischemic disease. Redemonstrated small chronic infarct within the right cerebellar hemisphere. No evidence of intracranial mass. No extra-axial fluid collection. Vascular: Expected proximal arterial flow voids. Skull and upper cervical spine: No focal marrow lesion. Sinuses/Orbits: Visualized orbits show no acute finding. Trace scattered paranasal sinus mucosal thickening at the imaged levels. MRA HEAD FINDINGS: The intracranial internal carotid arteries are patent. The M1 middle cerebral arteries are patent. High-grade focal stenosis with possible short-segment flow gap at site of previously demonstrated mid to distal right M2 MCA branch occlusion (series 10, images 125-134) (series 1081, image 3). The anterior cerebral arteries are patent. The intracranial vertebral  arteries are patent. The basilar artery is patent. The posterior cerebral arteries are patent. A right posterior communicating artery is present. The left posterior communicating artery is hypoplastic or absent. No intracranial aneurysm is identified. IMPRESSION: MRI brain: 1. Acute right MCA vascular territory infarct as described. This has significantly increased in extent as compared to the brain MRI of 05/10/2020, and may have subtly increased in extent as compared to the head CT performed earlier today. No significant mass effect at this time 2. SWI signal loss along the right sylvian fissure which likely reflects a combination of petechial hemorrhage and the small-volume acute subarachnoid hemorrhage previously demonstrated at this site. 3. Small acute cortically-based infarct within the right occipital lobe (PCA vascular territory). 4. Punctate acute cortical infarct within the left occipital lobe (PCA vascular territory). 5. Background chronic findings without interval change, as described. MRA head: 1. High-grade focal stenosis with possible short-segment flow gap at site of previously demonstrated mid-to-distal right M2 MCA branch occlusion. 2. Elsewhere, no intracranial large vessel occlusion or proximal high-grade arterial stenosis is identified. Electronically Signed   By: Kellie Simmering DO   On: 05/11/2020 18:10   MR BRAIN WO CONTRAST  Result Date: 05/10/2020 CLINICAL DATA:  TIA. Dysarthria and slurred speech. Mildly weak on the left. EXAM: MRI HEAD WITHOUT  CONTRAST MRA HEAD WITHOUT CONTRAST TECHNIQUE: Multiplanar, multiecho pulse sequences of the brain and surrounding structures were obtained without intravenous contrast. Angiographic images of the head were obtained using MRA technique without contrast. COMPARISON:  CT head 05/10/2020 FINDINGS: MRI HEAD FINDINGS Brain: Acute infarct in the right insular cortex and right parietal operculum. There is FLAIR hyperintensity in the right MCA branches in  this area due to slow flow or thrombosis. There is susceptibility in the right sylvian fissure compatible with thrombus in the right MCA branch. Mild atrophy. Mild chronic microvascular ischemic change in the white matter. 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 Negative for mass lesion. Vascular: Normal arterial flow voids at the base of brain. Susceptibility in the right sylvian fissure with FLAIR hyperintensity in the right MCA vessels compatible with slow flow or occlusion Skull and upper cervical spine: Negative Sinuses/Orbits: Mild mucosal edema paranasal sinuses. Bilateral cataract extraction Other: None MRA HEAD FINDINGS Internal carotid artery widely patent bilaterally without stenosis. Anterior cerebral arteries widely patent with mild irregularity. Left middle cerebral artery widely patent with mild irregularity. Right M1 segment patent. Occlusion of the superior division of the right M3 vessel. Inferior division right M2 widely patent. Posterior circulation normal without significant stenosis. Fetal origin right posterior cerebral artery. IMPRESSION: 1. 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 2. Mild chronic microvascular ischemic change in the white matter 3. 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   On: 05/10/2020 13:52   IR CT Head Ltd  Result Date: 05/12/2020 INDICATION: New onset of right gaze deviation and left sided weakness arm greater than leg. 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 EXAM: 1. EMERGENT LARGE VESSEL OCCLUSION THROMBOLYSIS (anterior CIRCULATION) COMPARISON:  CT angiogram of head and neck of May 10, 2020. MEDICATIONS: Ancef 2 g IV antibiotic was administered within 1 hour of the procedure. ANESTHESIA/SEDATION: General anesthesia CONTRAST:  Isovue 300 approximately 100 mL. FLUOROSCOPY TIME:  Fluoroscopy Time: 79 minutes 24 seconds (2346 mGy).  COMPLICATIONS: None immediate. TECHNIQUE: Following a full explanation of the procedure along with the potential associated complications, an informed witnessed consent was obtained. The risks of intracranial hemorrhage of 10%, worsening neurological deficit, ventilator dependency, death and inability to revascularize were all reviewed in detail with the patient's spouse. The patient was then put under general anesthesia by the Department of Anesthesiology at [facility]. The right groin was prepped and draped in the usual sterile fashion. Thereafter using modified Seldinger technique, transfemoral access into the right common femoral artery was obtained without difficulty. Over a 0.035 inch guidewire an 8 [place] Pinnacle 25 cm sheath was inserted. Through this, and also over a 0.035 inch guidewire a combination of a 5.5 French Simmons 2 catheter inside of the 087 balloon guide catheter was advanced over a 0.035 inch Roadrunner guidewire and positioned without difficulty into the right common carotid artery. The guidewire was removed. Good aspiration was obtained from the hub of the balloon guide catheter. FINDINGS: A gentle control arteriogram performed through the balloon guide catheter demonstrated no evidence of spasms, dissections or of intraluminal filling defects. The right common carotid arteriogram demonstrates wide patency of the right external carotid artery and its major branches. The right internal carotid artery at the bulb to the cranial skull base demonstrates wide patency. The petrous, the cavernous and supraclinoid segments are widely patent. The right middle cerebral artery and the right anterior cerebral artery are  seen to opacify into the capillary and venous phases. 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 Right posterior communicating artery is seen opacifying the right posterior cerebral artery  distribution. PROCEDURE: Through the 087 balloon guide catheter in the right common carotid artery, a combination of an 055 136 Zoom aspiration catheter with an 035 Zoom aspiration catheter was advanced over a 0.014 inch standard Synchro micro guidewire with a J configuration to the right M1 segment. Using a torque device, access was obtained to just proximal to the occluded superior division M2 segment. Access to the distal M3 M4 inferior division branch was obtained just distal to the occluded site. However, advancement of the 035 Zoom aspiration catheter was met with significant resistance with change in configuration distally. The micro guidewire, and the 035 Zoom aspiration catheter were removed. The 055 Zoom aspiration was advanced to be just distal to the occluded site of the superior division. Constant aspiration was then performed via a Penumbra aspiration device for approximately 2 minutes. Thereafter, the 055 Zoom aspiration was retrieved and removed. A control arteriogram performed through the balloon guide in the distal right ICA continued to demonstrate no change in the occluded superior division prominent branch. No clot was noted in the aspirate or within the 055 aspiration catheter. Another pass was then made again using the 055 aspiration catheter with a 162 021 microcatheter over a 0.014 inch standard Synchro micro guidewire. This combination was again advanced as described earlier and positioned at the site of the occlusion. The micro guidewire was replaced with an 016 Fathom micro guidewire. This advanced easily through the occluded superior division occluded branch into the M4 region. This was then followed by the microcatheter. The guidewire was removed. Good aspiration obtained from the hub of the microcatheter. A gentle control arteriogram performed through the microcatheter demonstrated safe position of the tip of the microcatheter which was then connected to continuous heparinized saline  infusion. A 3 mm x 40 mm Solitaire X retrieval device was then advanced to the distal end of the microcatheter and deployed in the usual manner without difficulty. The microcatheter was retrieved more proximally into the right internal carotid artery. The 055 Zoom aspiration was then advanced abutting against the occluded superior division M2 segment. With proximal flow arrest in the right internal carotid artery, and continuous aspiration with a 20 mL syringe at the hub of the balloon guide catheter, and constant aspiration applied at the hub of the 055 Zoom aspiration catheter for approximately 2-1/2 minutes, the combination of the retrieval device, the microcatheter and the Zoom 055 aspiration catheter were retrieved and removed. Thick sticky clot was seen entangled within the retrieval device, and also in the hub of the Tuohy Vance. A control arteriogram performed following reversal of flow arrest in the right internal carotid artery demonstrated now complete revascularization of 2 of the 3 main branches emanating from the superior division. However, there continued be a distal M3 occlusion of an anterior perisylvian branch leading on up to the hyper perfused subcortical frontal perisylvian region seen on the CT perfusion mapping studies. A TICI 2C revascularization was achieved. 200 mcg of nitroglycerin was then infused through the balloon guide catheter in the right internal carotid artery to obviate mild to moderate spasm at the proximal superior division of the right MCA. A final control arteriogram performed through the balloon guide in the right common carotid artery continued to demonstrate patency  of the extracranial, and intracranial right ICA, a TICI 2C revascularization of the right middle cerebral artery distribution. The right anterior cerebral artery and right posterior communicating artery demonstrate wide patency unchanged. The balloon guide was removed. An 8 French Angio-Seal closure device was  applied for hemostasis at the right groin puncture site. Distal pulses were Dopplerable in both feet unchanged. A CT of the brain demonstrated focal area of hyperattenuation in the putamen on the right side associated with moderate hyperattenuation in the posterior right perisylvian subarachnoid space. No evidence of mass or midline shift was noted. The patient was left intubated for airway protection. He was then transferred to the neuro ICU for post embolization management. IMPRESSION: Status post endovascular near complete revascularization of occluded superior division of the right middle cerebral artery with 1 pass with contact aspiration, and 1 pass with contact aspiration with a 3 mm x 40 mm Solitaire retrieval device achieving a TICI 2C revascularization. PLAN: Follow-up with the referring MD. Electronically Signed   By: Luanne Bras M.D.   On: 05/11/2020 10:58   CT CEREBRAL PERFUSION W CONTRAST  Addendum Date: 05/10/2020   ADDENDUM REPORT: 05/10/2020 15:20 ADDENDUM: These results were called by telephone at the time of interpretation on 05/10/2020 at 3:20 pm to provider [facility] , who verbally acknowledged these results. Electronically Signed   By: Franchot Gallo M.D.   On: 05/10/2020 15:20   Result Date: 05/10/2020 CLINICAL DATA:  Stroke. EXAM: CT ANGIOGRAPHY HEAD AND NECK CT PERFUSION BRAIN TECHNIQUE: Multidetector CT imaging of the head and neck was performed using the standard protocol during bolus administration of intravenous contrast. Multiplanar CT image reconstructions and MIPs were obtained to evaluate the vascular anatomy. Carotid stenosis measurements (when applicable) are obtained utilizing NASCET criteria, using the distal internal carotid diameter as the denominator. Multiphase CT imaging of the brain was performed following IV bolus contrast injection. Subsequent parametric perfusion maps were calculated using RAPID software. CONTRAST:  160mL OMNIPAQUE IOHEXOL 350 MG/ML  SOLN COMPARISON:  CT head 05/10/2020.  MRI and MRA head 05/10/2020 FINDINGS: CTA NECK FINDINGS Aortic arch: Standard branching. Imaged portion shows no evidence of aneurysm or dissection. No significant stenosis of the major arch vessel origins. Right carotid system: Mild atherosclerotic calcification right carotid bifurcation without significant stenosis. No irregularity or thrombus in the right carotid. Left carotid system: Mild atherosclerotic disease left carotid without stenosis or irregularity. Vertebral arteries: Both vertebral arteries widely patent. Skeleton: Disc degeneration and spurring. Foraminal narrowing due to spurring bilaterally at C5-6 and C6-7. No acute skeletal abnormality. Other neck: No mass or edema identified. Upper chest: Lung apices clear bilaterally. Review of the MIP images confirms the above findings CTA HEAD FINDINGS Anterior circulation: Cavernous carotid widely patent bilaterally. Anterior cerebral arteries widely patent. Left middle cerebral artery widely patent. Right M1 segment widely patent. There is occlusion of a large right M3 branch superior division right MCA as noted on prior MRA. This appears acute. Posterior circulation: Normal posterior circulation. No stenosis or large vessel occlusion. Venous sinuses: Normal venous enhancement. Anatomic variants: None Review of the MIP images confirms the above findings CT Brain Perfusion Findings: ASPECTS: 10 CBF (<30%) Volume: 31mL Perfusion (Tmax>6.0s) volume: 44mL Mismatch Volume: 58mL Infarction Location:Right insula and right operculum. IMPRESSION: 1. Abnormal CT perfusion. 29 mm core infarct with additional 33 mL of surrounding penumbra in the right insula and operculum corresponding to restricted diffusion on MRI. 2. Occluded right superior branch of M3 which is a relatively large vessel compatible  with an embolus. 3. No significant carotid or vertebral artery stenosis in the neck. Electronically Signed: By: Franchot Gallo M.D.  On: 05/10/2020 15:15   DG CHEST PORT 1 VIEW  Result Date: 05/12/2020 CLINICAL DATA:  Aspiration EXAM: PORTABLE CHEST 1 VIEW COMPARISON:  05/11/2020 FINDINGS: Nasogastric tube extends into the upper abdomen. Mild elevation of the right hemidiaphragm is unchanged with associated right basilar atelectasis. Lungs are otherwise clear. No pneumothorax or pleural effusion. Cardiac size is within normal limits. Apparent mediastinal widening is likely artifactual and related to semi-erect positioning. Pulmonary vascularity is normal. IMPRESSION: Stable mild elevation of the right hemidiaphragm with associated right basilar atelectasis. Electronically Signed   By: Fidela Salisbury MD   On: 05/12/2020 06:11   DG CHEST PORT 1 VIEW  Result Date: 05/11/2020 CLINICAL DATA:  Hypoxia EXAM: PORTABLE CHEST 1 VIEW COMPARISON:  05/10/2020 FINDINGS: Cardiac shadow is stable. Gastric catheter is noted in satisfactory position. Previously seen endotracheal tube is no longer identified. Bibasilar atelectatic changes are noted slightly increased when compared with the prior exam likely accentuated by poor inspiratory effort. No acute bony abnormality is seen. IMPRESSION: Increasing bibasilar atelectasis. Electronically Signed   By: Inez Catalina M.D.   On: 05/11/2020 12:48   DG CHEST PORT 1 VIEW  Result Date: 05/10/2020 CLINICAL DATA:  Endotracheal tube adjustment EXAM: PORTABLE CHEST 1 VIEW COMPARISON:  05/10/2020 FINDINGS: Single frontal view of the chest demonstrates endotracheal tube overlying tracheal air column, tip remains approximately 8.7 cm above carina. Recommend advancing 5-6 cm. Cardiac silhouette is stable. Hypoventilatory changes at the lung bases. No effusion or pneumothorax. IMPRESSION: 1. Endotracheal tube remains well above carina, recommend advancing 5-6 cm. 2. Bilateral lower lobe hypoventilatory changes. Electronically Signed   By: Randa Ngo M.D.   On: 05/10/2020 20:18   DG CHEST PORT 1 VIEW  Result  Date: 05/10/2020 CLINICAL DATA:  Endotracheal tube present. EXAM: PORTABLE CHEST 1 VIEW COMPARISON:  Earlier this day. FINDINGS: The endotracheal tube is been retracted, the tip is now 9.7 cm above the carina. Re-expansion of the right upper lobe in the interim. Low lung volumes with patchy bibasilar opacities, left greater than right. No visualized pneumothorax. Stable heart size and mediastinal contours. IMPRESSION: 1. Endotracheal tube retracted, tip now 9.7 cm above the carina. Advancement of 5-6 cm would place the endotracheal tube tip at the level of the clavicular heads. 2. Re-expansion of the right upper lobe. 3. Low lung volumes with patchy bibasilar opacities, left greater than right, likely atelectasis. Electronically Signed   By: Keith Rake M.D.   On: 05/10/2020 20:06   Portable Chest x-ray  Result Date: 05/10/2020 CLINICAL DATA:  Intubated, stroke EXAM: PORTABLE CHEST 1 VIEW COMPARISON:  None. FINDINGS: Single frontal view of the chest demonstrates endotracheal tube overlying tracheal air column, tip approximately 1.5 cm above carina. There is dense right upper lobe consolidation with volume loss consistent with atelectasis. This could be due to mucous plugging. Hypoventilatory changes are seen at the left lung base. No effusion or pneumothorax. No acute bony abnormalities. IMPRESSION: 1. Endotracheal tube approximately 1.5 cm above carina. 2. Dense right upper lobe consolidation and volume loss consistent with atelectasis. This could be due to mucous plugging. 3. Hypoventilatory changes at the left lung base. These results were called by telephone at the time of interpretation on 05/10/2020 at 7:18 pm to provider DR Lucile Shutters , who verbally acknowledged these results. Electronically Signed   By: Randa Ngo M.D.   On: 05/10/2020 19:31  DG Abd Portable 1V  Result Date: 05/11/2020 CLINICAL DATA:  Check gastric catheter placement EXAM: PORTABLE ABDOMEN - 1 VIEW COMPARISON:  None. FINDINGS:  Gastric catheter is noted within the stomach. No obstructive changes are seen. No free air is noted. IMPRESSION: Gastric catheter within the stomach. Electronically Signed   By: Inez Catalina M.D.   On: 05/11/2020 12:58   DG Swallowing Func-Speech Pathology  Result Date: 05/12/2020 Objective Swallowing Evaluation: Type of Study: MBS-Modified Barium Swallow Study  Patient Details Name: Thomas Sampson MRN: [id number] Date of Birth: [birth year] Today's Date: 05/12/2020 Time: SLP Start Time (ACUTE ONLY): 1403 -SLP Stop Time (ACUTE ONLY): 1424 SLP Time Calculation (min) (ACUTE ONLY): 21 min Past Medical History: Past Medical History: Diagnosis Date . Chronic kidney disease   kidney stones . Diabetes mellitus without complication ([place])  . Hyperlipidemia  . Hypertension  . Tinea pedis  Past Surgical History: Past Surgical History: Procedure Laterality Date . COLON SURGERY   . COLONOSCOPY WITH PROPOFOL N/A 09/18/2016  Procedure: COLONOSCOPY WITH PROPOFOL;  Surgeon: Lollie Sails, MD;  Location: [facility] END[facility]COPY;  Service: Endoscopy;  Laterality: N/A; . EYE SURGERY    cataract extraction . IR CT HEAD LTD  05/10/2020 . IR PERCUTANEOUS ART THROMBECTOMY/INFUSION INTRACRANIAL INC DIAG ANGIO  05/10/2020 . RADIOLOGY WITH ANESTHESIA N/A 05/10/2020  Procedure: IR WITH ANESTHESIA;  Surgeon: Radiologist, Medication, MD;  Location: [place];  Service: Radiology;  Laterality: N/A; HPI: 74 year-old male presented [facility] on 4/11 with new left-sided weakness and slurred speech. 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  On 4/11 he was emergently underwent revascularization of occluded superior division right MCA through mechanical thrombectomy.  ETT 4/11-/12.  No data recorded Assessment / Plan / Recommendation CHL IP CLINICAL IMPRESSIONS 05/12/2020 Clinical Impression Oropharyngeal dysphagia exhibited by lingual  pumping, decreased cohesion, delayed transit and mistimed sequlae of swallow. Delayed and effortful bolus propulsion with bolus on posterior base of tongue while pt attempting to retract tongue and initiate swallow. There was left anterior spill and stasis with thinner consistencies. Thin barium penetrated vestibule and was aspirated into before protective mechanisms were initiated followed by reflexive cough. A chin tuck did not eliminate or reduce aspiration which occured silently. Nectar thick boluses consistently reached his pyriform sinuses for 4-5 seconds prior to onset. Minimal valleculuar residue. Challenged with nectar with larger sips via straw filling pyriform sinuses which did not enter vestibule. Recommend pt initiate Dys 1 (pureee), nectar, crush pills, check left side oral cavity for pocketing and full assist for strategies. SLP Visit Diagnosis Dysphagia, oropharyngeal phase (R13.12) Attention and concentration deficit following -- Frontal lobe and executive function deficit following -- Impact on safety and function Moderate aspiration risk   CHL IP TREATMENT RECOMMENDATION 05/12/2020 Treatment Recommendations Therapy as outlined in treatment plan below   Prognosis 05/12/2020 Prognosis for Safe Diet Advancement Good Barriers to Reach Goals -- Barriers/Prognosis Comment -- CHL IP DIET RECOMMENDATION 05/12/2020 SLP Diet Recommendations Dysphagia 1 (Puree) solids;Nectar thick liquid Liquid Administration via Cup;Straw Medication Administration Crushed with puree Compensations Minimize environmental distractions;Slow rate;Small sips/bites;Lingual sweep for clearance of pocketing Postural Changes Seated upright at 90 degrees   CHL IP OTHER RECOMMENDATIONS 05/12/2020 Recommended Consults -- Oral Care Recommendations Oral care BID Other Recommendations Order thickener from pharmacy   CHL IP FOLLOW UP RECOMMENDATIONS 05/12/2020 Follow up Recommendations Inpatient Rehab   CHL IP FREQUENCY AND DURATION 05/12/2020  Speech Therapy Frequency (ACUTE ONLY) min 2x/week Treatment Duration 2 weeks      CHL IP ORAL PHASE 05/12/2020 Oral Phase Impaired Oral - Pudding Teaspoon -- Oral - Pudding Cup -- Oral - Honey Teaspoon -- Oral - Honey Cup -- Oral - Nectar Teaspoon -- Oral - Nectar Cup Lingual pumping;Delayed oral transit;Weak lingual manipulation;Reduced posterior propulsion Oral - Nectar Straw -- Oral - Thin Teaspoon -- Oral - Thin Cup Left anterior bolus loss;Decreased bolus cohesion;Left pocketing in lateral sulci Oral - Thin Straw -- Oral - Puree WFL Oral - Mech Soft -- Oral - Regular Delayed oral transit;Weak lingual manipulation;Impaired mastication Oral - Multi-Consistency -- Oral - Pill -- Oral Phase - Comment --  CHL IP PHARYNGEAL PHASE 05/12/2020 Pharyngeal Phase Impaired Pharyngeal- Pudding Teaspoon -- Pharyngeal -- Pharyngeal- Pudding Cup -- Pharyngeal -- Pharyngeal- Honey Teaspoon -- Pharyngeal -- Pharyngeal- Honey Cup -- Pharyngeal -- Pharyngeal- Nectar Teaspoon -- Pharyngeal -- Pharyngeal- Nectar Cup Delayed swallow initiation-pyriform sinuses;Delayed swallow initiation-vallecula;Pharyngeal residue - valleculae Pharyngeal -- Pharyngeal- Nectar Straw -- Pharyngeal -- Pharyngeal- Thin Teaspoon -- Pharyngeal -- Pharyngeal- Thin Cup Penetration/Aspiration before swallow;Compensatory strategies attempted (with notebox) Pharyngeal Material enters airway, passes BELOW cords without attempt by patient to eject out (silent aspiration);Material enters airway, passes BELOW cords and not ejected out despite cough attempt by patient Pharyngeal- Thin Straw -- Pharyngeal -- Pharyngeal- Puree WFL Pharyngeal -- Pharyngeal- Mechanical Soft -- Pharyngeal -- Pharyngeal- Regular WFL Pharyngeal -- Pharyngeal- Multi-consistency -- Pharyngeal -- Pharyngeal- Pill -- Pharyngeal -- Pharyngeal Comment --  CHL IP CERVICAL ESOPHAGEAL PHASE 05/12/2020 Cervical Esophageal Phase WFL Pudding Teaspoon -- Pudding Cup -- Honey Teaspoon -- Honey Cup --  Nectar Teaspoon -- Nectar Cup -- Nectar Straw -- Thin Teaspoon -- Thin Cup -- Thin Straw -- Puree -- Mechanical Soft -- Regular -- Multi-consistency -- Pill -- Cervical Esophageal Comment -- [place] Siren 05/12/2020, 4:48 PM  Orbie Pyo Litaker M.Ed Actor Pager [phone] Office [phone]             ECHOCARDIOGRAM COMPLETE  Result Date: 05/12/2020    ECHOCARDIOGRAM REPORT   Patient Name:   Thomas Sampson Date of Exam: 05/12/2020 Medical Rec #:  [id number]       Height:       69.0 in Accession #:    [id number]      Weight:       220.2 lb Date of Birth:  [birth year]      BSA:          2.152 m Patient Age:    48 years        BP:           106/56 mmHg Patient Gender: M               HR:           74 bpm. Exam Location:  Inpatient Procedure: 2D Echo, Cardiac Doppler and Color Doppler Indications:    CVA  History:        Patient has no prior history of Echocardiogram examinations.                 Signs/Symptoms:CKD; Risk Factors:Hypertension, Dyslipidemia,                 Diabetes and Former Smoker.  Sonographer:    Dustin Flock Referring Phys: [place]  1. Left ventricular ejection fraction, by estimation, is 70 to 75%. The left ventricle has hyperdynamic function. The left ventricle  has no regional wall motion abnormalities. There is mild concentric left ventricular hypertrophy. Left ventricular diastolic parameters were normal.  2. Right ventricular systolic function is normal. The right ventricular size is normal.  3. The mitral valve is normal in structure. Trivial mitral valve regurgitation.  4. The aortic valve is tricuspid. There is mild thickening of the aortic valve. Aortic valve regurgitation is not visualized. No aortic stenosis is present.  5. Aortic dilatation noted. There is mild dilatation of the aortic root, measuring 40 mm. Comparison(s): No prior Echocardiogram. Conclusion(s)/Recommendation(s): No intracardiac source of embolism detected  on this transthoracic study. A transesophageal echocardiogram is recommended to exclude cardiac source of embolism if clinically indicated. FINDINGS  Left Ventricle: Left ventricular ejection fraction, by estimation, is 70 to 75%. The left ventricle has hyperdynamic function. The left ventricle has no regional wall motion abnormalities. The left ventricular internal cavity size was normal in size. There is mild concentric left ventricular hypertrophy. Left ventricular diastolic parameters were normal. Right Ventricle: The right ventricular size is normal. Right vetricular wall thickness was not well visualized. Right ventricular systolic function is normal. Left Atrium: Left atrial size was normal in size. Right Atrium: Right atrial size was normal in size. Pericardium: There is no evidence of pericardial effusion. Mitral Valve: The mitral valve is normal in structure. Trivial mitral valve regurgitation. Tricuspid Valve: The tricuspid valve is normal in structure. Tricuspid valve regurgitation is trivial. Aortic Valve: The aortic valve is tricuspid. There is mild thickening of the aortic valve. Aortic valve regurgitation is not visualized. No aortic stenosis is present. Pulmonic Valve: The pulmonic valve was normal in structure. Pulmonic valve regurgitation is trivial. Aorta: Aortic dilatation noted. There is mild dilatation of the aortic root, measuring 40 mm. IAS/Shunts: No atrial level shunt detected by color flow Doppler.  LEFT VENTRICLE PLAX 2D LVIDd:         4.25 cm  Diastology LVIDs:         2.00 cm  LV e' medial:    7.18 cm/s LV PW:         0.90 cm  LV E/e' medial:  10.3 LV IVS:        1.05 cm  LV e' lateral:   9.57 cm/s LVOT diam:     2.00 cm  LV E/e' lateral: 7.7 LV SV:         71 LV SV Index:   33 LVOT Area:     3.14 cm  RIGHT VENTRICLE RV Basal diam:  3.40 cm RV S prime:     17.90 cm/s TAPSE (M-mode): 3.3 cm LEFT ATRIUM             Index       RIGHT ATRIUM           Index LA diam:        3.50 cm 1.63  cm/m  RA Area:     16.00 cm LA Vol (A2C):   47.9 ml 22.25 ml/m RA Volume:   42.30 ml  19.65 ml/m LA Vol (A4C):   33.7 ml 15.66 ml/m LA Biplane Vol: 41.9 ml 19.47 ml/m  AORTIC VALVE LVOT Vmax:   127.00 cm/s LVOT Vmean:  82.500 cm/s LVOT VTI:    0.225 m  AORTA Ao Root diam: 3.50 cm MITRAL VALVE MV Area (PHT): 4.21 cm    SHUNTS MV Decel Time: 180 msec    Systemic VTI:  0.22 m MV E velocity: 74.10 cm/s  Systemic Diam: 2.00 cm MV A  velocity: 75.00 cm/s MV E/A ratio:  0.99 Gwyndolyn Kaufman MD Electronically signed by Gwyndolyn Kaufman MD Signature Date/Time: 05/12/2020/12:59:11 PM    Final    IR PERCUTANEOUS ART THROMBECTOMY/INFUSION INTRACRANIAL INC DIAG ANGIO  Result Date: 05/12/2020 INDICATION: New onset of right gaze deviation and left sided weakness arm greater than leg. 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 EXAM: 1. EMERGENT LARGE VESSEL OCCLUSION THROMBOLYSIS (anterior CIRCULATION) COMPARISON:  CT angiogram of head and neck of May 10, 2020. MEDICATIONS: Ancef 2 g IV antibiotic was administered within 1 hour of the procedure. ANESTHESIA/SEDATION: General anesthesia CONTRAST:  Isovue 300 approximately 100 mL. FLUOROSCOPY TIME:  Fluoroscopy Time: 79 minutes 24 seconds (2346 mGy). COMPLICATIONS: None immediate. TECHNIQUE: Following a full explanation of the procedure along with the potential associated complications, an informed witnessed consent was obtained. The risks of intracranial hemorrhage of 10%, worsening neurological deficit, ventilator dependency, death and inability to revascularize were all reviewed in detail with the patient's spouse. The patient was then put under general anesthesia by the Department of Anesthesiology at [facility]. The right groin was prepped and draped in the usual sterile fashion. Thereafter using modified Seldinger technique, transfemoral access into the right common femoral artery was obtained without  difficulty. Over a 0.035 inch guidewire an 8 [place] Pinnacle 25 cm sheath was inserted. Through this, and also over a 0.035 inch guidewire a combination of a 5.5 French Simmons 2 catheter inside of the 087 balloon guide catheter was advanced over a 0.035 inch Roadrunner guidewire and positioned without difficulty into the right common carotid artery. The guidewire was removed. Good aspiration was obtained from the hub of the balloon guide catheter. FINDINGS: A gentle control arteriogram performed through the balloon guide catheter demonstrated no evidence of spasms, dissections or of intraluminal filling defects. The right common carotid arteriogram demonstrates wide patency of the right external carotid artery and its major branches. The right internal carotid artery at the bulb to the cranial skull base demonstrates wide patency. The petrous, the cavernous and supraclinoid segments are widely patent. The right middle cerebral artery and the right anterior cerebral artery are seen to opacify into the capillary and venous phases. 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 Right posterior communicating artery is seen opacifying the right posterior cerebral artery distribution. PROCEDURE: Through the 087 balloon guide catheter in the right common carotid artery, a combination of an 055 136 Zoom aspiration catheter with an 035 Zoom aspiration catheter was advanced over a 0.014 inch standard Synchro micro guidewire with a J configuration to the right M1 segment. Using a torque device, access was obtained to just proximal to the occluded superior division M2 segment. Access to the distal M3 M4 inferior division branch was obtained just distal to the occluded site. However, advancement of the 035 Zoom aspiration catheter was met with significant resistance with change in configuration distally. The micro guidewire, and the 035 Zoom  aspiration catheter were removed. The 055 Zoom aspiration was advanced to be just distal to the occluded site of the superior division. Constant aspiration was then performed via a Penumbra aspiration device for approximately 2 minutes. Thereafter, the 055 Zoom aspiration was retrieved and removed. A control arteriogram performed through the balloon guide in the distal right ICA continued to demonstrate no change in the occluded superior division prominent branch. No clot was noted in the aspirate or within the  055 aspiration catheter. Another pass was then made again using the 055 aspiration catheter with a 162 021 microcatheter over a 0.014 inch standard Synchro micro guidewire. This combination was again advanced as described earlier and positioned at the site of the occlusion. The micro guidewire was replaced with an 016 Fathom micro guidewire. This advanced easily through the occluded superior division occluded branch into the M4 region. This was then followed by the microcatheter. The guidewire was removed. Good aspiration obtained from the hub of the microcatheter. A gentle control arteriogram performed through the microcatheter demonstrated safe position of the tip of the microcatheter which was then connected to continuous heparinized saline infusion. A 3 mm x 40 mm Solitaire X retrieval device was then advanced to the distal end of the microcatheter and deployed in the usual manner without difficulty. The microcatheter was retrieved more proximally into the right internal carotid artery. The 055 Zoom aspiration was then advanced abutting against the occluded superior division M2 segment. With proximal flow arrest in the right internal carotid artery, and continuous aspiration with a 20 mL syringe at the hub of the balloon guide catheter, and constant aspiration applied at the hub of the 055 Zoom aspiration catheter for approximately 2-1/2 minutes, the combination of the retrieval device, the microcatheter  and the Zoom 055 aspiration catheter were retrieved and removed. Thick sticky clot was seen entangled within the retrieval device, and also in the hub of the [place]. A control arteriogram performed following reversal of flow arrest in the right internal carotid artery demonstrated now complete revascularization of 2 of the 3 main branches emanating from the superior division. However, there continued be a distal M3 occlusion of an anterior perisylvian branch leading on up to the hyper perfused subcortical frontal perisylvian region seen on the CT perfusion mapping studies. A TICI 2C revascularization was achieved. 200 mcg of nitroglycerin was then infused through the balloon guide catheter in the right internal carotid artery to obviate mild to moderate spasm at the proximal superior division of the right MCA. A final control arteriogram performed through the balloon guide in the right common carotid artery continued to demonstrate patency of the extracranial, and intracranial right ICA, a TICI 2C revascularization of the right middle cerebral artery distribution. The right anterior cerebral artery and right posterior communicating artery demonstrate wide patency unchanged. The balloon guide was removed. An 8 French Angio-Seal closure device was applied for hemostasis at the right groin puncture site. Distal pulses were Dopplerable in both feet unchanged. A CT of the brain demonstrated focal area of hyperattenuation in the putamen on the right side associated with moderate hyperattenuation in the posterior right perisylvian subarachnoid space. No evidence of mass or midline shift was noted. The patient was left intubated for airway protection. He was then transferred to the neuro ICU for post embolization management. IMPRESSION: Status post endovascular near complete revascularization of occluded superior division of the right middle cerebral artery with 1 pass with contact aspiration, and 1 pass with contact  aspiration with a 3 mm x 40 mm Solitaire retrieval device achieving a TICI 2C revascularization. PLAN: Follow-up with the referring MD. Electronically Signed   By: Luanne Bras M.D.   On: 05/11/2020 10:58   VAS [place] LOWER EXTREMITY VENOUS (DVT)  Result Date: 05/12/2020  Lower Venous DVT Study Indications: Swelling and erythema LT>RT, s/p stroke.  Limitations: S/P RT cath. Comparison Study: No prior studies. Performing Technologist: Darlin Coco RDMS,RVT  Examination Guidelines: A complete evaluation includes B-mode  imaging, spectral Doppler, color Doppler, and power Doppler as needed of all accessible portions of each vessel. Bilateral testing is considered an integral part of a complete examination. Limited examinations for reoccurring indications may be performed as noted. The reflux portion of the exam is performed with the patient in reverse Trendelenburg.  +---------+---------------+---------+-----------+----------+-------------------+ RIGHT    CompressibilityPhasicitySpontaneityPropertiesThrombus Aging      +---------+---------------+---------+-----------+----------+-------------------+ CFV      Full           Yes      Yes                                      +---------+---------------+---------+-----------+----------+-------------------+ SFJ                                                   Limited visibility                                                        due to recent cath  +---------+---------------+---------+-----------+----------+-------------------+ FV Prox  Full                                                             +---------+---------------+---------+-----------+----------+-------------------+ FV Mid   Full                                                             +---------+---------------+---------+-----------+----------+-------------------+ FV DistalFull                                                              +---------+---------------+---------+-----------+----------+-------------------+ PFV      Full                                                             +---------+---------------+---------+-----------+----------+-------------------+ POP      Full           Yes      Yes                                      +---------+---------------+---------+-----------+----------+-------------------+ PTV      Full                                                             +---------+---------------+---------+-----------+----------+-------------------+  PERO     Full                                                             +---------+---------------+---------+-----------+----------+-------------------+   +---------+---------------+---------+-----------+----------+--------------+ LEFT     CompressibilityPhasicitySpontaneityPropertiesThrombus Aging +---------+---------------+---------+-----------+----------+--------------+ CFV      Full           Yes      Yes                                 +---------+---------------+---------+-----------+----------+--------------+ SFJ      Full                                                        +---------+---------------+---------+-----------+----------+--------------+ FV Prox  Full                                                        +---------+---------------+---------+-----------+----------+--------------+ FV Mid   Full                                                        +---------+---------------+---------+-----------+----------+--------------+ FV DistalFull                                                        +---------+---------------+---------+-----------+----------+--------------+ PFV      Full                                                        +---------+---------------+---------+-----------+----------+--------------+ POP      Full           Yes      Yes                                  +---------+---------------+---------+-----------+----------+--------------+ PTV      Full                                                        +---------+---------------+---------+-----------+----------+--------------+ PERO     Full                                                        +---------+---------------+---------+-----------+----------+--------------+  Summary: RIGHT: - There is no evidence of deep vein thrombosis in the lower extremity.  - No cystic structure found in the popliteal fossa.  LEFT: - There is no evidence of deep vein thrombosis in the lower extremity.  - No cystic structure found in the popliteal fossa.  *See table(s) above for measurements and observations. Electronically signed by Monica Martinez MD on 05/12/2020 at 2:16:36 PM.    Final    CT ANGIO HEAD CODE STROKE  Addendum Date: 05/10/2020   ADDENDUM REPORT: 05/10/2020 15:20 ADDENDUM: These results were called by telephone at the time of interpretation on 05/10/2020 at 3:20 pm to provider [facility] , who verbally acknowledged these results. Electronically Signed   By: Franchot Gallo M.D.   On: 05/10/2020 15:20   Result Date: 05/10/2020 CLINICAL DATA:  Stroke. EXAM: CT ANGIOGRAPHY HEAD AND NECK CT PERFUSION BRAIN TECHNIQUE: Multidetector CT imaging of the head and neck was performed using the standard protocol during bolus administration of intravenous contrast. Multiplanar CT image reconstructions and MIPs were obtained to evaluate the vascular anatomy. Carotid stenosis measurements (when applicable) are obtained utilizing NASCET criteria, using the distal internal carotid diameter as the denominator. Multiphase CT imaging of the brain was performed following IV bolus contrast injection. Subsequent parametric perfusion maps were calculated using RAPID software. CONTRAST:  156mL OMNIPAQUE IOHEXOL 350 MG/ML SOLN COMPARISON:  CT head 05/10/2020.  MRI and MRA head 05/10/2020 FINDINGS: CTA NECK FINDINGS Aortic  arch: Standard branching. Imaged portion shows no evidence of aneurysm or dissection. No significant stenosis of the major arch vessel origins. Right carotid system: Mild atherosclerotic calcification right carotid bifurcation without significant stenosis. No irregularity or thrombus in the right carotid. Left carotid system: Mild atherosclerotic disease left carotid without stenosis or irregularity. Vertebral arteries: Both vertebral arteries widely patent. Skeleton: Disc degeneration and spurring. Foraminal narrowing due to spurring bilaterally at C5-6 and C6-7. No acute skeletal abnormality. Other neck: No mass or edema identified. Upper chest: Lung apices clear bilaterally. Review of the MIP images confirms the above findings CTA HEAD FINDINGS Anterior circulation: Cavernous carotid widely patent bilaterally. Anterior cerebral arteries widely patent. Left middle cerebral artery widely patent. Right M1 segment widely patent. There is occlusion of a large right M3 branch superior division right MCA as noted on prior MRA. This appears acute. Posterior circulation: Normal posterior circulation. No stenosis or large vessel occlusion. Venous sinuses: Normal venous enhancement. Anatomic variants: None Review of the MIP images confirms the above findings CT Brain Perfusion Findings: ASPECTS: 10 CBF (<30%) Volume: 1mL Perfusion (Tmax>6.0s) volume: 42mL Mismatch Volume: 65mL Infarction Location:Right insula and right operculum. IMPRESSION: 1. Abnormal CT perfusion. 29 mm core infarct with additional 33 mL of surrounding penumbra in the right insula and operculum corresponding to restricted diffusion on MRI. 2. Occluded right superior branch of M3 which is a relatively large vessel compatible with an embolus. 3. No significant carotid or vertebral artery stenosis in the neck. Electronically Signed: By: Franchot Gallo M.D. On: 05/10/2020 15:15   CT ANGIO NECK CODE STROKE  Addendum Date: 05/10/2020   ADDENDUM REPORT:  05/10/2020 15:20 ADDENDUM: These results were called by telephone at the time of interpretation on 05/10/2020 at 3:20 pm to provider [facility] , who verbally acknowledged these results. Electronically Signed   By: Franchot Gallo M.D.   On: 05/10/2020 15:20   Result Date: 05/10/2020 CLINICAL DATA:  Stroke. EXAM: CT ANGIOGRAPHY HEAD AND NECK CT PERFUSION BRAIN TECHNIQUE: Multidetector CT imaging of the head and  neck was performed using the standard protocol during bolus administration of intravenous contrast. Multiplanar CT image reconstructions and MIPs were obtained to evaluate the vascular anatomy. Carotid stenosis measurements (when applicable) are obtained utilizing NASCET criteria, using the distal internal carotid diameter as the denominator. Multiphase CT imaging of the brain was performed following IV bolus contrast injection. Subsequent parametric perfusion maps were calculated using RAPID software. CONTRAST:  138mL OMNIPAQUE IOHEXOL 350 MG/ML SOLN COMPARISON:  CT head 05/10/2020.  MRI and MRA head 05/10/2020 FINDINGS: CTA NECK FINDINGS Aortic arch: Standard branching. Imaged portion shows no evidence of aneurysm or dissection. No significant stenosis of the major arch vessel origins. Right carotid system: Mild atherosclerotic calcification right carotid bifurcation without significant stenosis. No irregularity or thrombus in the right carotid. Left carotid system: Mild atherosclerotic disease left carotid without stenosis or irregularity. Vertebral arteries: Both vertebral arteries widely patent. Skeleton: Disc degeneration and spurring. Foraminal narrowing due to spurring bilaterally at C5-6 and C6-7. No acute skeletal abnormality. Other neck: No mass or edema identified. Upper chest: Lung apices clear bilaterally. Review of the MIP images confirms the above findings CTA HEAD FINDINGS Anterior circulation: Cavernous carotid widely patent bilaterally. Anterior cerebral arteries widely patent. Left  middle cerebral artery widely patent. Right M1 segment widely patent. There is occlusion of a large right M3 branch superior division right MCA as noted on prior MRA. This appears acute. Posterior circulation: Normal posterior circulation. No stenosis or large vessel occlusion. Venous sinuses: Normal venous enhancement. Anatomic variants: None Review of the MIP images confirms the above findings CT Brain Perfusion Findings: ASPECTS: 10 CBF (<30%) Volume: 71mL Perfusion (Tmax>6.0s) volume: 15mL Mismatch Volume: 1mL Infarction Location:Right insula and right operculum. IMPRESSION: 1. Abnormal CT perfusion. 29 mm core infarct with additional 33 mL of surrounding penumbra in the right insula and operculum corresponding to restricted diffusion on MRI. 2. Occluded right superior branch of M3 which is a relatively large vessel compatible with an embolus. 3. No significant carotid or vertebral artery stenosis in the neck. Electronically Signed: By: Franchot Gallo M.D. On: 05/10/2020 15:15       HISTORY OF PRESENT ILLNESS Thomas Sampson a 74 y.o.malewith medical history significant ofhypertension, hyperlipidemia, diabetes mellitus, GERD, CKD-4, kidney stone, claudication, who presented to [facility] with left-sided weakness, slurred speech upon arising from sleep at 7:30 AM.  He was last known normal at 11 PM, 05/10/2019.  Patient symptoms initially improved upon arrival and he had an NIH stroke scale of 0 with only mild left-sided weakness and diminished fine motor skills..  CT scan of the head was negative for acute abnormalities.  He was seen by neurologist Dr. 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  CT perfusion scan was obtained which showed a ischemic core of 29 cc and a penumbra of 63 cc and patient was discussed with neuro  interventionist Dr. Estanislado Pandy and transferred to [facility] for consideration for mechanical thrombectomy.  Patient neurological exam decline while he was in [facility] and his NIH stroke scale increased to 10 with right gaze deviation left visual field cut and mild left-sided ataxia.  HOSPITAL COURSE Patient was transferred to [facility] for IR thrombectomy to revascularize the R MCA. The patient was successfully extubated the morning after the procedure. Repeat CT head displayed large R frontal infarct and trace SAH that  appeared improved after thrombectomy. Patient was noted to have improved L sided weakness and mild improvements in his gaze preference. 24h after thrombectomy MRI showed larger R frontal MCA branch infarct with MRA showing persistent high-grade stenosis of the R M3 branch, but improved post- thrombectomy. Patient was placed on a Dysphagia 1 diet and continued to work with PT/OT. Stroke workup included Echo for the patient did not find structural abnormalities that would increase risk for stroke. CTA Neck did not find significant stenosis or occlusion of the large vessels.  Patient did appear to have edema of the L leg > R; however [place] was negative for DVT. Patietn was started on ASA+ Plavix and due to his reported hx of taking ASA daily, was recommended 3 months on both and then Plavix alone. Patient gaze preference improved and patient was able to cross midline. 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 Patient continued to work hard and was approved for CIR. Prior to CIR a loop recorder was placed out of concern that the patient's stroke may have been cardioembolic.       DISCHARGE EXAM Blood pressure 135/75, pulse 63, temperature 98.3 F (36.8 C), temperature source Oral, resp. rate 18, height 5\' 9"  (1.753 m), SpO2 94 %.  Gen: NAD, elderly white male, resting in bed HEENT: EOM intact today, crosses midline CV: RRR Resp:  Symmetrical chest rise and fall, no acute distress Ext: SCDs in place Skin: No brusing or lesions noted.   Neurological Exam : Awake oriented to person, place, time, and situation. Patient speech is mildly dysarthric, but understandable.No gaze preference today, EOMI. He follows all commands well.87mm pupils. Mild ptosis of the L eye.  He has moderate left lower facial weakness, with facial droop.Tongue midline.LLE 3/5 still some drift.RLE 5/5can resist pressure.RUE 5/5. LUE 2-/5 able to abduct arm slightly, no adduction.Light Touchintact to all extremities today.He does not have left hemibody neglect.  Some dysmetria in the R, unable to test L FTN 2/2 weakness.Extinction is intact today.Gait not tested Pt. Continues to have random moments of inability to control emotions and becomes tearful, but reports he is not sad. (Pseudobubar effect on display) Discharge Diet      Diet   DIET - DYS 1 Room service appropriate? No; Fluid consistency: Nectar Thick   liquids  DISCHARGE PLAN  Disposition:  Transfer to [facility] for ongoing PT, OT and ST  aspirin 325 mg daily and clopidogrel 75 mg daily for secondary stroke prevention for 3 months then Plavix alone.  Recommend ongoing stroke risk factor control by Primary Care Physician at time of discharge from inpatient rehabilitation.  Follow-up PCP [facility], MD in 2 weeks following discharge from rehab.  Follow-up in [facility] in 4 weeks following discharge from rehab, office to schedule an appointment.   35 minutes were spent preparing discharge.  Gracelyn Nurse, MD PGY-1  ATTENDING NOTE: I reviewed above note and agree with the assessment and plan. Pt was seen and examined.   Patient son and wife are at the bedside.  Patient awake alert and interactive.  Speech clearer than yesterday, less dysarthric.  Still has left arm plegic but 1/5 bicep, 2 -/5 bicep.   Still no finger movement.  Left lower extremity 4/5 proximal and 5/5 ankle PF/DF.  Given embolic pattern of stroke, recommend loop recorder placement to rule out A. fib before discharge to CIR today.  Continue aspirin 325 and Plavix 75 for 3 months then Plavix alone given large vessel occlusion.  Continue statin.  Still has hyperglycemia, diabetic coordinator is on board for further recommendations on insulin. Pt will follow up with stroke clinic NP at [facility] in about 4 weeks.    Rosalin Hawking, MD PhD Stroke Neurology 05/14/2020 2:46 PM

## 2020-05-14 NOTE — TOC Transition Note (Signed)
Transition of Care [facility]) - CM/SW Discharge Note   Patient Details  Name: ALECXIS BALTZELL MRN: [id number] Date of Birth: [birth year]  [facility] CM/SW Contact:  Pollie Friar, RN Phone Number: 05/14/2020, 10:02 AM   Clinical Narrative:    Patient is discharging to CIR today. No further needs per CM.   Final next level of care: IP Rehab Facility Barriers to Discharge: No Barriers Identified   Patient Goals and CMS Choice        Discharge Placement                       Discharge Plan and Services                                     Social Determinants of Health (SDOH) Interventions     Readmission Risk Interventions No flowsheet data found.

## 2020-05-14 NOTE — Progress Notes (Signed)
Patient arrived from [facility] accompanied by his wife and son. Patient appeared alert and denied pain.

## 2020-05-15 DIAGNOSIS — I63511 Cerebral infarction due to unspecified occlusion or stenosis of right middle cerebral artery: Secondary | ICD-10-CM | POA: Diagnosis not present

## 2020-05-15 LAB — GLUCOSE, CAPILLARY
Glucose-Capillary: 243 mg/dL — ABNORMAL HIGH (ref 70–99)
Glucose-Capillary: 376 mg/dL — ABNORMAL HIGH (ref 70–99)
Glucose-Capillary: 211 mg/dL — ABNORMAL HIGH (ref 70–99)
Glucose-Capillary: 284 mg/dL — ABNORMAL HIGH (ref 70–99)

## 2020-05-15 MED ORDER — PREGABALIN 25 MG PO CAPS
25.0000 mg | ORAL_CAPSULE | Freq: Two times a day (BID) | ORAL | Status: DC
  Administered 2020-05-15 – 2020-05-28 (×26): 25 mg via ORAL
  Filled 2020-05-15 (×26): qty 1

## 2020-05-15 MED ORDER — INSULIN GLARGINE 100 UNIT/ML ~~LOC~~ SOLN
43.0000 [IU] | Freq: Every day | SUBCUTANEOUS | Status: DC
  Administered 2020-05-16: 43 [IU] via SUBCUTANEOUS
  Filled 2020-05-15: qty 0.43

## 2020-05-15 NOTE — Progress Notes (Signed)
Inpatient Rehabilitation  Patient information reviewed and entered into eRehab system by Sanuel Ladnier M. Doyt Castellana, M.A., CCC/SLP, PPS Coordinator.  Information including medical coding, functional ability and quality indicators will be reviewed and updated through discharge.    

## 2020-05-15 NOTE — Progress Notes (Signed)
Inpatient Diabetes Program Recommendations  AACE/ADA: New Consensus Statement on Inpatient Glycemic Control   Target Ranges:  Prepandial:   less than 140 mg/dL      Peak postprandial:   less than 180 mg/dL (1-2 hours)      Critically ill patients:  140 - 180 mg/dL   Results for ALDAN, CAMEY (MRN [id number]) as of 05/15/2020 06:56  Ref. Range 05/14/2020 08:14 05/14/2020 12:31 05/14/2020 16:54 05/14/2020 21:07 05/15/2020 06:45  Glucose-Capillary Latest Ref Range: 70 - 99 mg/dL 255 (H) 372 (H) 172 (H) 201 (H) 243 (H)   Review of Glycemic Control  Diabetes history:DM2 Outpatient Diabetes medications:Glipizide XL 10 mg QHS, 70/30 42 units QAM, 70/30 52 units QPM, Novolog 26 units with breakfast, 26 units with lunch, and 22 units with supper Current orders for Inpatient glycemic control:Lantus42units daily, Novolog 0-20units TID with meals, Novolog 12 units TID with meals  Inpatient Diabetes Program Recommendations:  Insulin: Please consider increasing Lantus to45units daily, meal coverage to Novolog 16 units TID with meals if patient eats at least 50% of meals, and Novolog 0-5 units QHS.  NOTE: In reviewing chart, noted patient sees Dr. Gabriel Carina (Endocrinology) and was last seen on 05/07/20. Per office note on 05/07/20, patient was told to increase 70/30 to 42 units QAM and 52 units QPM. Once patient uses up 70/30 on hand, he was asked to start Tresiba 54 units daily and Novolog 20-35 units BID with meals (dose based on glucose).  Thanks, Barnie Alderman, RN, MSN, CDE Diabetes Coordinator Inpatient Diabetes Program [phone] (Team Pager from 8am to 5pm)

## 2020-05-15 NOTE — Evaluation (Signed)
Physical Therapy Assessment and Plan  Patient Details  Name: Thomas Sampson MRN: [id number] Date of Birth: Jul 11, 1945  PT Diagnosis: Abnormal posture, Abnormality of gait, Hemiplegia non-dominant, Impaired sensation and Muscle weakness Rehab Potential: Excellent ELOS: 12-16 days   Today's Date: 05/15/2020 PT Individual Time: 1400-1510 PT Individual Time Calculation (min): 70 min    Hospital Problem: Principal Problem:   Right middle cerebral artery stroke [facility])   Past Medical History:  Past Medical History:  Diagnosis Date  . Chronic kidney disease    kidney stones  . Diabetes mellitus without complication ([place])   . Hyperlipidemia   . Hypertension   . Tinea pedis    Past Surgical History:  Past Surgical History:  Procedure Laterality Date  . COLON SURGERY    . COLONOSCOPY WITH PROPOFOL N/A 09/18/2016   Procedure: COLONOSCOPY WITH PROPOFOL;  Surgeon: Lollie Sails, MD;  Location: [facility] ENDOSCOPY;  Service: Endoscopy;  Laterality: N/A;  . EYE SURGERY     cataract extraction  . IR CT HEAD LTD  05/10/2020  . IR PERCUTANEOUS ART THROMBECTOMY/INFUSION INTRACRANIAL INC DIAG ANGIO  05/10/2020  . RADIOLOGY WITH ANESTHESIA N/A 05/10/2020   Procedure: IR WITH ANESTHESIA;  Surgeon: Radiologist, Medication, MD;  Location: [place];  Service: Radiology;  Laterality: N/A;    Assessment & Plan Clinical Impression: Patient is a 75 year old right-handed male with history of chronic kidney disease stage IV, diabetes mellitus, hypertension, hyperlipidemia and tobacco abuse. Per chart review patient lives with spouse independent prior to admission. 1 level home with ramped entrance. Presented 05/10/2020 to [facility] with left-sided weakness and slurred speech of acute onset. 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 No  significant carotid or vertebral artery stenosis of the neck. Admission chemistries unremarkable except glucose 207, BUN 31, creatinine 2.26 hemoglobin A1c 8.1. Echocardiogram with ejection fraction of 70 to 75% no wall motion abnormalities. 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 Lower extremity Dopplers negative for DVT. Initially maintained on Cleviprex for blood pressure control. Neurology follow-up currently maintained on aspirin 81 mg daily plus Plavix 75 mg daily for 3 months then Plavix alone. Awaiting loop recorder placement today..Pt on dysphagia #1 nectar thick liquid diet. Patient transferred to CIR on 05/14/2020 .   Patient currently requires min with mobility secondary to muscle weakness, muscle joint tightness and muscle paralysis, decreased cardiorespiratoy endurance, abnormal tone, unbalanced muscle activation and decreased coordination, decreased visual perceptual skills, decreased visual motor skills and field cut, decreased attention to left and decreased sitting balance, decreased standing balance, decreased postural control, hemiplegia and decreased balance strategies.  Prior to hospitalization, patient was modified independent  with mobility and lived with Spouse in a House home.  Home access is  Ramped entrance.  Patient will benefit from skilled PT intervention to maximize safe functional mobility, minimize fall risk and decrease caregiver burden for planned discharge home with 24 hour supervision.  Anticipate patient will benefit from follow up [place] at discharge.  PT - End of Session Activity Tolerance: Tolerates 30+ min activity with multiple rests Endurance Deficit: Yes PT Assessment Rehab Potential (ACUTE/IP ONLY): Excellent PT Barriers to Discharge: Decreased caregiver support;Lack of/limited family support;Behavior PT Patient demonstrates impairments in the following area(s):  Balance;Behavior;Edema;Motor;Endurance;Nutrition;Pain;Perception;Safety;Sensory;Skin Integrity PT Transfers Functional Problem(s): Bed Mobility;Bed to Chair;Car;Furniture;Floor PT Locomotion Functional Problem(s): Ambulation;Wheelchair Mobility;Stairs PT  Plan PT Intensity: Minimum of 1-2 x/day ,45 to 90 minutes PT Frequency: 5 out of 7 days PT Duration Estimated Length of Stay: 12-16 days PT Treatment/Interventions: Ambulation/gait training;Community reintegration;DME/adaptive equipment instruction;Neuromuscular re-education;Psychosocial support;Stair training;UE/LE Strength taining/ROM;Wheelchair propulsion/positioning;Balance/vestibular training;Discharge planning;Functional electrical stimulation;Pain management;Skin care/wound management;Therapeutic Activities;UE/LE Coordination activities;Cognitive remediation/compensation;Disease management/prevention;Patient/family education;Functional mobility training;Splinting/orthotics;Therapeutic Exercise;Visual/perceptual remediation/compensation PT Transfers Anticipated Outcome(s): supervisoin assist with LRAD PT Locomotion Anticipated Outcome(s): Supervision assist and LRAD PT Recommendation Recommendations for Other Services: Therapeutic Recreation consult Therapeutic Recreation Interventions: Outing/community reintergration;Stress management Follow Up Recommendations: Home health PT Equipment Recommended: Rolling walker with 5" wheels;To be determined   PT Evaluation Precautions/Restrictions Precautions Precautions: Fall Precaution Comments: Lt inattention, Lt hemiparesis UE>LE General   Vital SignsTherapy Vitals Temp: 98.2 F (36.8 C) Temp Source: Oral Pulse Rate: 77 Resp: 17 BP: 138/80 Patient Position (if appropriate): Sitting Oxygen Therapy SpO2: 96 % O2 Device: Room Air Pain Pain Assessment Pain Scale: 0-10 Pain Score: 0-No pain Home Living/Prior Functioning Home Living Available Help at Discharge: Family;Available 24  hours/day Type of Home: House Home Access: Ramped entrance Home Layout: One level Bathroom Shower/Tub: Multimedia programmer: Standard Bathroom Accessibility: Yes  Lives With: Spouse Prior Function Level of Independence: Independent with basic ADLs;Independent with homemaking with ambulation Driving: Yes Vocation: Retired Comments: occassional SPC use for community mobility. Vision/Perception  Vision - Assessment Eye Alignment: Within Functional Limits Ocular Range of Motion: Restricted on the left Alignment/Gaze Preference: Head turned;Gaze right Tracking/Visual Pursuits: Decreased smoothness of eye movement to LEFT superior field;Decreased smoothness of eye movement to LEFT inferior field Saccades: Undershoots;Decreased speed of saccadic movement;Additional eye shifts occurred during testing Perception Perception: Impaired Inattention/Neglect: Does not attend to left visual field;Does not attend to left side of body Praxis Praxis: Intact  Cognition Arousal/Alertness: Awake/alert Immediate Memory Recall: Sock;Bed Memory Recall Sock: Without Cue Memory Recall Blue: Without Cue Memory Recall Bed: Not able to recall Awareness: Impaired (Lt inattention) Safety/Judgment: Impaired (due to significant Lt inattention) Sensation Sensation Light Touch: Appears Intact (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) Coordination Gross Motor Movements are Fluid and Coordinated: No Fine Motor Movements are Fluid and Coordinated: No Coordination and Movement Description: L UE hemiplegia Finger Nose Finger Test: increased force with end range movement on the Rt side, unable to complete on the Lt Heel Shin Test: [facility] Rvs L Motor  Motor Motor: Abnormal tone;Abnormal postural alignment and control;Hemiplegia   Trunk/Postural Assessment  Cervical Assessment Cervical Assessment: Exceptions to [facility] (forward head) Thoracic Assessment Thoracic Assessment: Exceptions to [facility] (rounded  shoulders) Lumbar Assessment Lumbar Assessment: Exceptions to [facility] (posterior pelvic tilt) Postural Control Postural Control: Deficits on evaluation (Lt lean in sitting/standing, seated LOBs during dynamic activity with OT providing Mod balance assist while pt helped with donning 1 gripper sock)  Balance Balance Balance Assessed: Yes Static Sitting Balance Static Sitting - Level of Assistance: 6: Modified independent (Device/Increase time) Dynamic Sitting Balance Dynamic Sitting - Balance Support: No upper extremity supported Dynamic Sitting - Level of Assistance: 5: Stand by assistance Static Standing Balance Static Standing - Balance Support: No upper extremity supported Static Standing - Level of Assistance: 4: Min assist;5: Stand by assistance Dynamic Standing Balance Dynamic Standing - Balance Support: No upper extremity supported Dynamic Standing - Level of Assistance: 4: Min assist Dynamic Standing - Balance Activities: Lateral lean/weight shifting;Forward lean/weight shifting (elevating clothing post toileting) Extremity Assessment  RUE Assessment RUE Assessment: Within Functional Limits Active Range of Motion (AROM) Comments: WNL LUE Assessment LUE Assessment: Exceptions to [facility] LUE Body System:  Neuro Brunstrum levels for arm and hand: Arm;Hand Brunstrum level for arm: Stage IV Movement is deviating from synergy Brunstrum level for hand: Stage II Synergy is developing (developing flexor synergy) LUE Tone LUE Tone: Mild;Hypertonic RLE Assessment RLE Assessment: Within Functional Limits General Strength Comments: grossly 4+/5 proximal to distal LLE Assessment LLE Assessment: Within Functional Limits General Strength Comments: grossly 4+/5 proximal to distal  Care Tool Care Tool Bed Mobility Roll left and right activity   Roll left and right assist level: Minimal Assistance - Patient > 75%    Sit to lying activity   Sit to lying assist level: Minimal Assistance -  Patient > 75%    Lying to sitting edge of bed activity   Lying to sitting edge of bed assist level: Minimal Assistance - Patient > 75%     Care Tool Transfers Sit to stand transfer   Sit to stand assist level: Minimal Assistance - Patient > 75%    Chair/bed transfer   Chair/bed transfer assist level: Minimal Assistance - Patient > 75%     Toilet transfer   Assist Level: Moderate Assistance - Patient 50 - 74%    Car transfer   Car transfer assist level: Moderate Assistance - Patient 50 - 74%      Care Tool Locomotion Ambulation   Assist level: Minimal Assistance - Patient > 75% Assistive device: Hand held assist Max distance: 80  Walk 10 feet activity   Assist level: Minimal Assistance - Patient > 75% Assistive device: Hand held assist   Walk 50 feet with 2 turns activity   Assist level: Minimal Assistance - Patient > 75%    Walk 150 feet activity Walk 150 feet activity did not occur: Safety/medical concerns      Walk 10 feet on uneven surfaces activity   Assist level: Moderate Assistance - Patient - 50 - 74% Assistive device: Hand held assist  Stairs   Assist level: Moderate Assistance - Patient - 50 - 74% Stairs assistive device: 1 hand rail Max number of stairs: 4  Walk up/down 1 step activity   Walk up/down 1 step (curb) assist level: Moderate Assistance - Patient - 50 - 74% Walk up/down 1 step or curb assistive device: 1 hand rail    Walk up/down 4 steps activity   Walk up/down 4 steps assistive device: 1 hand rail  Walk up/down 12 steps activity Walk up/down 12 steps activity did not occur: Safety/medical concerns      Pick up small objects from floor        Wheelchair   Type of Wheelchair: Manual   Wheelchair assist level: Minimal Assistance - Patient > 75% Max wheelchair distance: 150  Wheel 50 feet with 2 turns activity   Assist Level: Minimal Assistance - Patient > 75%  Wheel 150 feet activity   Assist Level: Minimal Assistance - Patient > 75%     Refer to Care Plan for Long Term Goals  SHORT TERM GOAL WEEK 1 PT Short Term Goal 1 (Week 1): Pt will perform bed mobility with supervisoin assist PT Short Term Goal 2 (Week 1): Pt will ambulate 114f with min assist PT Short Term Goal 3 (Week 1): [facility] Short Term Goal 4 (Week 1): Pt will propell WC [id number] supervision assist  Recommendations for other services: Neuropsych and Therapeutic Recreation  Stress management and Outing/community reintegration  Skilled Therapeutic Intervention  Pt received supine in bed and agreeable to PT.  Supine>sit transfer with min assist and cues for awareness of te LUE. PT instructed patient in PT Evaluation and initiated treatment intervention; see above for results. PT educated patient in [place], rehab potential, rehab goals, and discharge recommendations along with recommendation for follow-up rehabilitation services.  Gait training with no AD x 79f and min assist for sublux management on the L. Stair management training as listed. Car transfer training with mod assist and cues for scanning to the L for safety. WC mobility with min assist and moderate ecues for awareness of the L side. Pt performed 5xSTS: 16 sec (>15 sec indicates increased fall risk) [facility] to stabilize the LUE. Toileting with min assist for trnasfersw and mod assist for clothing management. Pt performed pericare sitting in toilet. Patient returned to [facility] and [facility] and all needs met.       Mobility Bed Mobility Bed Mobility: Rolling Right;Rolling Left;Sit to Supine;Supine to Sit Rolling Right: Minimal Assistance - Patient > 75% Rolling Left: Minimal Assistance - Patient > 75% Supine to Sit: Minimal Assistance - Patient > 75% Sit to Supine: Minimal Assistance - Patient > 75% Transfers Transfers: Sit to Stand;Stand Pivot Transfers Sit to Stand: Minimal Assistance - Patient > 75% Stand Pivot  Transfers: Minimal Assistance - Patient > 75% Stand Pivot Transfer Details: Verbal cues for precautions/safety;Verbal cues for gait pattern Stand Pivot Transfer Details (indicate cue type and reason): cues to protect the LUE Transfer (Assistive device): None Locomotion  Gait Ambulation: Yes Gait Assistance: Minimal Assistance - Patient > 75% Gait Distance (Feet): 80 Feet Assistive device: None;1 person hand held assist Gait Assistance Details: Verbal cues for technique;Verbal cues for precautions/safety;Verbal cues for gait pattern Gait Gait: Yes Gait Pattern: Impaired Gait Pattern: Decreased step length - left;Step-through pattern;Trunk rotated posteriorly on left Stairs / Additional Locomotion Stairs: Yes Stairs Assistance: Moderate Assistance - Patient 50 - 74% Stair Management Technique: One rail Right Number of Stairs: 4 Height of Stairs: 6 Wheelchair Mobility Wheelchair Mobility: Yes Wheelchair Assistance: Minimal assistance - Patient >75% Wheelchair Propulsion: Right upper extremity Wheelchair Parts Management: Needs assistance Distance: 1564f  Discharge Criteria: Patient will be discharged from PT if patient refuses treatment 3 consecutive times without medical reason, if treatment goals not met, if there is a change in medical status, if patient makes no progress towards goals or if patient is discharged from hospital.  The above assessment, treatment plan, treatment alternatives and goals were discussed and mutually agreed upon: by patient and by family  AuLorie Phenix/16/2022, 3:36 PM

## 2020-05-15 NOTE — Plan of Care (Signed)
  Problem: Consults Goal: RH STROKE PATIENT EDUCATION Description: See Patient Education module for education specifics  Outcome: Progressing Goal: Nutrition Consult-if indicated Outcome: Progressing Goal: Diabetes Guidelines if Diabetic/Glucose > 140 Description: If diabetic or lab glucose is > 140 mg/dl - Initiate Diabetes/Hyperglycemia Guidelines & Document Interventions  Outcome: Progressing   Problem: RH BOWEL ELIMINATION Goal: RH STG MANAGE BOWEL WITH ASSISTANCE Description: STG Manage Bowel with min Assistance. Outcome: Progressing Goal: RH STG MANAGE BOWEL W/MEDICATION W/ASSISTANCE Description: STG Manage Bowel with Medication with min Assistance. Outcome: Progressing   Problem: RH SKIN INTEGRITY Goal: RH STG SKIN FREE OF INFECTION/BREAKDOWN Outcome: Progressing Goal: RH STG MAINTAIN SKIN INTEGRITY WITH ASSISTANCE Description: STG Maintain Skin Integrity With min Assistance. Outcome: Progressing Goal: RH STG ABLE TO PERFORM INCISION/WOUND CARE W/ASSISTANCE Description: STG Able To Perform Incision/Wound Care With min Assistance. Outcome: Progressing   Problem: RH SAFETY Goal: RH STG ADHERE TO SAFETY PRECAUTIONS W/ASSISTANCE/DEVICE Description: STG Adhere to Safety Precautions With min Assistance/Device. Outcome: Progressing Goal: RH STG DECREASED RISK OF FALL WITH ASSISTANCE Description: STG Decreased Risk of Fall With min Assistance. Outcome: Progressing   Problem: RH COGNITION-NURSING Goal: RH STG USES MEMORY AIDS/STRATEGIES W/ASSIST TO PROBLEM SOLVE Description: STG Uses Memory Aids/Strategies With min Assistance to Problem Solve. Outcome: Progressing Goal: RH STG ANTICIPATES NEEDS/CALLS FOR ASSIST W/ASSIST/CUES Description: STG Anticipates Needs/Calls for Assist With min Assistance/Cues. Outcome: Progressing   Problem: RH PAIN MANAGEMENT Goal: RH STG PAIN MANAGED AT OR BELOW PT'S PAIN GOAL Description: <3 out of 10 on pain scale Outcome: Progressing    Problem: RH KNOWLEDGE DEFICIT Goal: RH STG INCREASE KNOWLEDGE OF DIABETES Description: Patient will be able to manage DM with medications and dietary modifications using handouts and educational tools with min assist  Outcome: Progressing Goal: RH STG INCREASE KNOWLEDGE OF HYPERTENSION Description: Patient will be able to manage HTN with medications and dietary modifications using handouts and educational tools with min assist  Outcome: Progressing Goal: RH STG INCREASE KNOWLEDGE OF DYSPHAGIA/FLUID INTAKE Description: Patient will be able to manage Dysphagia: nutrition and dietary modifications using handouts and educational tools with min assist  Outcome: Progressing Goal: RH STG INCREASE KNOWLEGDE OF HYPERLIPIDEMIA Description: Patient will be able to manage HLD with medications and dietary modifications using handouts and educational tools with min assist  Outcome: Progressing Goal: RH STG INCREASE KNOWLEDGE OF STROKE PROPHYLAXIS Description: Patient will be able to manage secondary stroke risks with medications and dietary modifications using handouts and educational tools with min assist Outcome: Progressing   Problem: RH BLADDER ELIMINATION Goal: RH STG MANAGE BLADDER WITH ASSISTANCE Description: STG Manage Bladder With min Assistance Outcome: Not Progressing Goal: RH STG MANAGE BLADDER WITH EQUIPMENT WITH ASSISTANCE Description: STG Manage Bladder With Equipment With min Assistance Outcome: Not Progressing

## 2020-05-15 NOTE — Plan of Care (Signed)
  Problem: RH Balance Goal: LTG Patient will maintain dynamic sitting balance (PT) Description: LTG:  Patient will maintain dynamic sitting balance with assistance during mobility activities (PT) Flowsheets (Taken 05/15/2020 1740) LTG: Pt will maintain dynamic sitting balance during mobility activities with:: Independent Goal: LTG Patient will maintain dynamic standing balance (PT) Description: LTG:  Patient will maintain dynamic standing balance with assistance during mobility activities (PT) Flowsheets (Taken 05/15/2020 1740) LTG: Pt will maintain dynamic standing balance during mobility activities with:: Supervision/Verbal cueing   Problem: RH Bed Mobility Goal: LTG Patient will perform bed mobility with assist (PT) Description: LTG: Patient will perform bed mobility with assistance, with/without cues (PT). Flowsheets (Taken 05/15/2020 1740) LTG: Pt will perform bed mobility with assistance level of: Supervision/Verbal cueing   Problem: RH Bed to Chair Transfers Goal: LTG Patient will perform bed/chair transfers w/assist (PT) Description: LTG: Patient will perform bed to chair transfers with assistance (PT). Flowsheets (Taken 05/15/2020 1740) LTG: Pt will perform Bed to Chair Transfers with assistance level: Independent with assistive device    Problem: RH Car Transfers Goal: LTG Patient will perform car transfers with assist (PT) Description: LTG: Patient will perform car transfers with assistance (PT). Flowsheets (Taken 05/15/2020 1740) LTG: Pt will perform car transfers with assist:: Contact Guard/Touching assist   Problem: RH Ambulation Goal: LTG Patient will ambulate in controlled environment (PT) Description: LTG: Patient will ambulate in a controlled environment, # of feet with assistance (PT). Flowsheets (Taken 05/15/2020 1740) LTG: Pt will ambulate in controlled environ  assist needed:: Supervision/Verbal cueing LTG: Ambulation distance in controlled environment: 124ft with  LRAD Goal: LTG Patient will ambulate in home environment (PT) Description: LTG: Patient will ambulate in home environment, # of feet with assistance (PT). Flowsheets (Taken 05/15/2020 1740) LTG: Pt will ambulate in home environ  assist needed:: Supervision/Verbal cueing LTG: Ambulation distance in home environment: 62ft with LRAD   Problem: RH Wheelchair Mobility Goal: LTG Patient will propel w/c in controlled environment (PT) Description: LTG: Patient will propel wheelchair in controlled environment, # of feet with assist (PT) Flowsheets (Taken 05/15/2020 1740) LTG: Pt will propel w/c in controlled environ  assist needed:: Supervision/Verbal cueing LTG: Propel w/c distance in controlled environment: 150

## 2020-05-15 NOTE — Progress Notes (Addendum)
PROGRESS NOTE   Subjective/Complaints: No complaints or questions.  Sleeping poorly, discussed trying to do PT/OT outside if possible and he would like that Had BM this morning  Objective:   EP PPM/ICD IMPLANT  Result Date: 05/14/2020 SURGEON:  Allegra Lai, MD   PREPROCEDURE DIAGNOSIS:  Cryptogenic Stroke   POSTPROCEDURE DIAGNOSIS:  Cryptogenic Stroke    PROCEDURES:  1. Implantable loop recorder implantation   INTRODUCTION:  Thomas Sampson is a 75 y.o. male with a history of unexplained stroke who presents today for implantable loop implantation.  The patient has had a cryptogenic stroke.  Despite an extensive workup by neurology, no reversible causes have been identified.  he has worn telemetry during which he did not have arrhythmias.  There is significant concern for possible atrial fibrillation as the cause for the patients stroke.  The patient therefore presents today for implantable loop implantation.   DESCRIPTION OF PROCEDURE:  Informed written consent was obtained, and the patient was brought to the electrophysiology lab in a fasting state.  The patient required no sedation for the procedure today.  Mapping over the patient's chest was performed by the EP lab staff to identify the area where electrograms were most prominent for ILR recording.  This area was found to be the left parasternal region over the 3rd-4th intercostal space. The patients left chest was therefore prepped and draped in the usual sterile fashion by the EP lab staff. The skin overlying the left parasternal region was infiltrated with lidocaine for local analgesia.  A 0.5-cm incision was made over the left parasternal region over the 3rd intercostal space.  A subcutaneous ILR pocket was fashioned using a combination of sharp and blunt dissection.  A Medtronic Reveal Linq model [place] [id number] implantable loop recorder was then placed into the pocket  R waves were  very prominent and measured 0.32mV. EBL<1 ml.  Steri- Strips and a sterile dressing were then applied.  There were no early apparent complications.   CONCLUSIONS:  1. Successful implantation of a Medtronic Reveal LINQ implantable loop recorder for cryptogenic stroke  2. No early apparent complications.   Recent Labs    05/13/20 0229 05/14/20 0209  WBC 11.9* 10.3  HGB 11.6* 11.7*  HCT 36.0* 35.7*  PLT 234 194   Recent Labs    05/13/20 0229 05/14/20 0209  NA 143 140  K 3.8 3.5  CL 109 109  CO2 27 27  GLUCOSE 223* 176*  BUN 24* 24*  CREATININE 2.02* 1.82*  CALCIUM 8.7* 8.2*    Intake/Output Summary (Last 24 hours) at 05/15/2020 1140 Last data filed at 05/15/2020 0859 Gross per 24 hour  Intake 474 ml  Output 1800 ml  Net -1326 ml        Physical Exam: Vital Signs Blood pressure (!) 149/71, pulse 71, temperature 98.4 F (36.9 C), temperature source Oral, resp. rate 18, weight 99.4 kg, SpO2 94 %. Gen: no distress, normal appearing HEENT: oral mucosa pink and moist, Thrush on tongue Cardio: Reg rate Chest: normal effort, normal rate of breathing Abd: soft, non-distended Ext: no edema Psych: pleasant, normal affect Musculoskeletal:  General: Swellingand tendernesspresent.  Cervical back: Normal range of  motion.  Comments: Mild right and left shoulder tenderness with AROM/PROM Skin: General: Skin is warmand dry.  Neurological:  Mental Status: He is alert.  Comments: Patient is alert. No acute distress. He does exhibit some left inattention. Speech is dysarthric but intelligible. Follows commands. Left central 7 and tongue deviation. Senses pain and light touch left face,arm, leg. DTR's 2+ LUE and LLE and 1+ on right. LUE: 1/5 delt, pec, biceps and 0/5 elsewhere. LLE: 3 to 3+/5 HF, KE and ADF/PF. Psychiatric:  Comments: Pleasant, pseudobulbar affect   Assessment/Plan: 1. Functional deficits which require 3+ hours per day of  interdisciplinary therapy in a comprehensive inpatient rehab setting.  Physiatrist is providing close team supervision and 24 hour management of active medical problems listed below.  Physiatrist and rehab team continue to assess barriers to discharge/monitor patient progress toward functional and medical goals  Care Tool:  Bathing              Bathing assist       Upper Body Dressing/Undressing Upper body dressing        Upper body assist      Lower Body Dressing/Undressing Lower body dressing            Lower body assist       Toileting Toileting    Toileting assist Assist for toileting: Total Assistance - Patient < 25%     Transfers Chair/bed transfer  Transfers assist           Locomotion Ambulation   Ambulation assist              Walk 10 feet activity   Assist           Walk 50 feet activity   Assist           Walk 150 feet activity   Assist           Walk 10 feet on uneven surface  activity   Assist           Wheelchair     Assist               Wheelchair 50 feet with 2 turns activity    Assist            Wheelchair 150 feet activity     Assist          Blood pressure (!) 149/71, pulse 71, temperature 98.4 F (36.9 C), temperature source Oral, resp. rate 18, weight 99.4 kg, SpO2 94 %.  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  Impaired mobility:  -DVT/anticoagulation:SCDs -antiplatelet therapy: continue Aspirin and Plavix 75 mg daily x3 months then Plavix alone 3. Pain Management:Denies pain: decrease Lyrica to 25 mg twice daily 4.  Mood:Provide emotional support -antipsychotic agents: N/A -add nuedexta for PBA. Discussed with pt/wife who would like to try it. 5. Neuropsych: This patientiscapable of making decisions on hisown behalf. 6. Skin/Wound Care:Routine skin checks 7. Fluids/Electrolytes/Nutrition:Routine in and outs with follow-up chemistrieson Monday 8. Dysphagia. Dysphagia #1 nectar liquids. Follow-up speech therapy 9. CKD stage IV. Admission BUN 2.26. Follow-up chemistries 10. Diabetes mellitus with peripheral neuropathy. Hemoglobin A1c 8.1.  -NovoLog 8 units 3 times daily -increase Lantus to 43 U -Check blood sugars before meals and at  bedtime 11. Hypertension. Norvasc 2.5 mg daily. Monitor with increased mobility on rehab 12. Hyperlipidemia. Lipitor 13.Tobacco abuse.Counseling 14. Obesity BMI 32.36: provide counseling    LOS: 1 days A FACE TO FACE EVALUATION WAS PERFORMED  Greysyn Vanderberg P Lorali Khamis 05/15/2020, 11:40 AM

## 2020-05-15 NOTE — Evaluation (Signed)
Speech Language Pathology Assessment and Plan  Patient Details  Name: Thomas Sampson MRN: [id number] Date of Birth: 02/20/1945  SLP Diagnosis: Dysarthria;Cognitive Impairments;Dysphagia  Rehab Potential: Excellent ELOS: 12-14 days    Today's Date: 05/15/2020 SLP Individual Time: 1305-1400 SLP Individual Time Calculation (min): 55 min   Hospital Problem: Principal Problem:   Right middle cerebral artery stroke [facility])  Past Medical History:  Past Medical History:  Diagnosis Date  . Chronic kidney disease    kidney stones  . Diabetes mellitus without complication ([place])   . Hyperlipidemia   . Hypertension   . Tinea pedis    Past Surgical History:  Past Surgical History:  Procedure Laterality Date  . COLON SURGERY    . COLONOSCOPY WITH PROPOFOL N/A 09/18/2016   Procedure: COLONOSCOPY WITH PROPOFOL;  Surgeon: Lollie Sails, MD;  Location: [facility] ENDOSCOPY;  Service: Endoscopy;  Laterality: N/A;  . EYE SURGERY     cataract extraction  . IR CT HEAD LTD  05/10/2020  . IR PERCUTANEOUS ART THROMBECTOMY/INFUSION INTRACRANIAL INC DIAG ANGIO  05/10/2020  . RADIOLOGY WITH ANESTHESIA N/A 05/10/2020   Procedure: IR WITH ANESTHESIA;  Surgeon: Radiologist, Medication, MD;  Location: [place];  Service: Radiology;  Laterality: N/A;    Assessment / Plan / Recommendation Clinical Impression Thomas Sampson is a 75 year old right-handed male with history of chronic kidney disease stage IV, diabetes mellitus, hypertension, hyperlipidemia and tobacco abuse. Per chart review patient lives with spouse independent prior to admission. 1 level home with ramped entrance. Presented 05/10/2020 to [facility] with left-sided weakness and slurred speech of acute onset. 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 No significant carotid or  vertebral artery stenosis of the neck. Admission chemistries unremarkable except glucose 207, BUN 31, creatinine 2.26 hemoglobin A1c 8.1. Echocardiogram with ejection fraction of 70 to 75% no wall motion abnormalities. 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 Lower extremity Dopplers negative for DVT. Initially maintained on Cleviprex for blood pressure control. Neurology follow-up currently maintained on aspirin 81 mg daily plus Plavix 75 mg daily for 3 months then Plavix alone. Awaiting loop recorder placement today..Pt on dysphagia #1 nectar thick liquid diet. Therapy evaluations completed due to patient's left-sided weakness and slurred speech. He was evaluated by the rehab team who felt that he would benefit from acomprehensive rehab program.  Pt presents with min-mild dysarthric speech, characterized by imprecise articulation and 100% intelligibility. Pt is very responsive to verbal cues for strategies to increase articulation. Receptive and expressive language judged to be intact. Portions of Cognistat reveal orientation, attention, comprehension, repetition, naming and calculations to be in average range. Reasoning, judgement and memory with mild impairment (SLUMS administered 05/14/18 with a score of 24/30). Wife and patient endorse very mild cognitive change, likely close to baseline function.  Pt presents with mild-mod oropharyngeal dysphagia at this time. Recent MBSS 05/12/20 revealed aspiration of thin liquids, please see Imaging in Chart Review for full report. This date, pt tolerating ice chips, NTL and puree with apparent functional oral and pharyngeal phase of swallow. Pt consuming ~8 oz thin liquid via spoon, cup and straw with 2 episodes immediate large coughing episode (one from cup and one from straw). Dys 2 and regular solid trials reveal oral phase impairments, however no overt pharyngeal phase deficits. Pt  with no anterior  loss of any trials, Dys 2 and regular solids with decrease bolus awareness and prep, mild stasis in L buccal area effectively cleared with liquid wash and lingual sweep. Pt is extremely responsive to verbal cues throughout, excellent rehab candidate. Pt will benefit from skilled ST during CIR stay to increase swallow function, speech/language cognitive function and promote independence before d/c home.     Skilled Therapeutic Interventions          Pt participating in Bedside Swallow Evaluation and Cognistat as well as informal assessments of speech, language and cognition. Please see above.   SLP Assessment  Patient will need skilled Speech Lanaguage Pathology Services during CIR admission    Recommendations  SLP Diet Recommendations: Dysphagia 1 (Puree);Nectar Liquid Administration via: Cup;Straw Medication Administration: Crushed with puree Supervision: Intermittent supervision to cue for compensatory strategies (for current diet, likely increase supervision as diet advances) Compensations: Minimize environmental distractions;Slow rate;Small sips/bites;Lingual sweep for clearance of pocketing Postural Changes and/or Swallow Maneuvers: Seated upright 90 degrees Oral Care Recommendations: Oral care BID Recommendations for Other Services: Neuropsych consult Patient destination: Home Follow up Recommendations: Outpatient SLP Equipment Recommended: To be determined    SLP Frequency 3 to 5 out of 7 days   SLP Duration  SLP Intensity  SLP Treatment/Interventions 12-14 days  Minumum of 1-2 x/day, 30 to 90 minutes  Cognitive remediation/compensation;Speech/Language facilitation;Cueing hierarchy;Functional tasks;Therapeutic Activities;Therapeutic Exercise;Internal/external aids;Dysphagia/aspiration precaution training;Medication managment;Patient/family education    Pain Pain Assessment Pain Scale: 0-10 Pain Score: 0-No pain  Prior Functioning Cognitive/Linguistic  Baseline: Within functional limits Type of Home: House  Lives With: Spouse Available Help at Discharge: Family;Available 24 hours/day Vocation: Retired  Programmer, systems Overall Cognitive Status: Impaired/Different from baseline Arousal/Alertness: Awake/alert Orientation Level: Oriented X4 Attention: Sustained Sustained Attention: Appears intact Memory: Impaired Memory Impairment: Retrieval deficit Immediate Memory Recall: Sock;Bed Memory Recall Sock: Without Cue Memory Recall Blue: Without Cue Memory Recall Bed: Not able to recall Awareness: Impaired Problem Solving: Impaired Problem Solving Impairment: Verbal basic;Verbal complex Behaviors: Lability Safety/Judgment: Impaired  Comprehension Auditory Comprehension Overall Auditory Comprehension: Appears within functional limits for tasks assessed Expression Expression Primary Mode of Expression: Verbal Verbal Expression Overall Verbal Expression: Appears within functional limits for tasks assessed Written Expression Dominant Hand: Left Oral Motor Oral Motor/Sensory Function Overall Oral Motor/Sensory Function: Moderate impairment Facial ROM: Reduced left;Suspected CN VII (facial) dysfunction Facial Symmetry: Abnormal symmetry left;Suspected CN VII (facial) dysfunction Facial Strength: Reduced left;Suspected CN VII (facial) dysfunction Facial Sensation: Reduced left Lingual ROM: Reduced left;Suspected CN XII (hypoglossal) dysfunction Lingual Symmetry: Abnormal symmetry left;Suspected CN XII (hypoglossal) dysfunction Lingual Strength: Reduced;Suspected CN XII (hypoglossal) dysfunction Mandible: Within Functional Limits Motor Speech Overall Motor Speech: Impaired Respiration: Within functional limits Phonation: Normal Resonance: Within functional limits Articulation: Impaired Level of Impairment: Sentence Intelligibility: Intelligible Motor Planning: Witnin functional limits  Care Tool Care Tool  Cognition Expression of Ideas and Wants Expression of Ideas and Wants: Some difficulty - exhibits some difficulty with expressing needs and ideas (e.g, some words or finishing thoughts) or speech is not clear   Understanding Verbal and Non-Verbal Content Understanding Verbal and Non-Verbal Content: Usually understands - understands most conversations, but misses some part/intent of message. Requires cues at times to understand   Memory/Recall Ability *first 3 days only Memory/Recall Ability *first 3 days only: Current season;Staff names and faces;That he or she is in a hospital/hospital unit    Bedside Swallowing Assessment General Date of Onset: 05/10/20 Previous Swallow Assessment: MBSS 4/13 - rec Dys 1/puree and NTL Diet  Prior to this Study: Dysphagia 1 (puree);Nectar-thick liquids Temperature Spikes Noted: No History of Recent Intubation: Yes Length of Intubations (days): 1 days Date extubated: 05/11/20 Behavior/Cognition: Alert;Cooperative;Pleasant mood Oral Cavity - Dentition: Adequate natural dentition Self-Feeding Abilities: Needs assist Vision: Functional for self-feeding Patient Positioning: Upright in chair/Tumbleform Baseline Vocal Quality: Low vocal intensity Volitional Cough: Strong Volitional Swallow: Able to elicit  Oral Care Assessment   Ice Chips Ice chips: Within functional limits Thin Liquid Thin Liquid: Impaired Presentation: Cup;Spoon;Straw Oral Phase Impairments: Reduced lingual movement/coordination Pharyngeal  Phase Impairments: Cough - Immediate Nectar Thick Nectar Thick Liquid: Within functional limits Puree Puree: Within functional limits Solid Solid: Impaired Oral Phase Impairments: Impaired mastication;Reduced lingual movement/coordination;Poor awareness of bolus Oral Phase Functional Implications: Left lateral sulci pocketing;Impaired mastication;Oral residue BSE Assessment Risk for Aspiration Impact on safety and function: Mild aspiration  risk  Short Term Goals: Week 1: SLP Short Term Goal 1 (Week 1): Pt will complete mildly complex problem solving tasks with min A verbal and visual cues. SLP Short Term Goal 2 (Week 1): Pt will recall novel/functional information with and without delay with min A cues for compensatory memory strategies SLP Short Term Goal 3 (Week 1): Pt will understand, recall and utilize strategies to increase speech intelligiblity at conversation level with min A verbal cues SLP Short Term Goal 4 (Week 1): Pt will consume trials ice chips and thin liquids with minimal overt s/s aspiration on 9/10 occurences. SLP Short Term Goal 5 (Week 1): Pt will consume trials Dys 2 solids with min A verbal cues to utilize compensatory strategies to increase bolus awareness  Refer to Care Plan for Long Term Goals  Recommendations for other services: Neuropsych  Discharge Criteria: Patient will be discharged from SLP if patient refuses treatment 3 consecutive times without medical reason, if treatment goals not met, if there is a change in medical status, if patient makes no progress towards goals or if patient is discharged from hospital.  The above assessment, treatment plan, treatment alternatives and goals were discussed and mutually agreed upon: by patient and by family  Dewaine Conger 05/15/2020, 3:51 PM

## 2020-05-15 NOTE — Evaluation (Signed)
Occupational Therapy Assessment and Plan  Patient Details  Name: Thomas Sampson MRN: [id number] Date of Birth: 10/26/1945  OT Diagnosis: abnormal posture, acute pain, hemiplegia affecting dominant side and muscle weakness (generalized) Rehab Potential: Rehab Potential (ACUTE ONLY): Excellent ELOS: 12-14 days   Today's Date: 05/15/2020 OT Individual Time: 4098-1191 OT Individual Time Calculation (min): 63 min     Hospital Problem: Principal Problem:   Right middle cerebral artery stroke [facility])   Past Medical History:  Past Medical History:  Diagnosis Date  . Chronic kidney disease    kidney stones  . Diabetes mellitus without complication ([place])   . Hyperlipidemia   . Hypertension   . Tinea pedis    Past Surgical History:  Past Surgical History:  Procedure Laterality Date  . COLON SURGERY    . COLONOSCOPY WITH PROPOFOL N/A 09/18/2016   Procedure: COLONOSCOPY WITH PROPOFOL;  Surgeon: Lollie Sails, MD;  Location: [facility] ENDOSCOPY;  Service: Endoscopy;  Laterality: N/A;  . EYE SURGERY     cataract extraction  . IR CT HEAD LTD  05/10/2020  . IR PERCUTANEOUS ART THROMBECTOMY/INFUSION INTRACRANIAL INC DIAG ANGIO  05/10/2020  . RADIOLOGY WITH ANESTHESIA N/A 05/10/2020   Procedure: IR WITH ANESTHESIA;  Surgeon: Radiologist, Medication, MD;  Location: [facility];  Service: Radiology;  Laterality: N/A;    Assessment & Plan Clinical Impression:  Thomas Sampson is a 75 year old right-handed male with history of chronic kidney disease stage IV, diabetes mellitus, hypertension, hyperlipidemia and tobacco abuse. Per chart review patient lives with spouse independent prior to admission. 1 level home with ramped entrance. Presented 05/10/2020 to [facility] with left-sided weakness and slurred speech of acute onset. 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 MRA as well as CT angiogram of head and neck showed occlusion of right M3 branch superior  division compatible with embolus. No significant carotid or vertebral artery stenosis of the neck. Admission chemistries unremarkable except glucose 207, BUN 31, creatinine 2.26 hemoglobin A1c 8.1. Echocardiogram with ejection fraction of 70 to 75% no wall motion abnormalities. 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 Lower extremity Dopplers negative for DVT. Initially maintained on Cleviprex for blood pressure control. Neurology follow-up currently maintained on aspirin 81 mg daily plus Plavix 75 mg daily for 3 months then Plavix alone. Awaiting loop recorder placement today..Pt on dysphagia #1 nectar thick liquid diet. Therapy evaluations completed due to patient's left-sided weakness and slurred speech. He was evaluated by the rehab team who felt that he would benefit from acomprehensive rehab program.  Patient currently requires mod with basic self-care skills secondary to muscle weakness and muscle paralysis, decreased cardiorespiratoy endurance, abnormal tone, unbalanced muscle activation and decreased coordination, decreased midline orientation and decreased attention to left and decreased sitting balance, decreased standing balance, decreased postural control and hemiplegia.  Prior to hospitalization, patient could complete BADLs with independent .  Patient will benefit from skilled intervention to increase independence with basic self-care skills prior to discharge home with wife support.  Anticipate patient will require 24 hour supervision and follow up home health.  OT - End of Session Endurance Deficit: Yes OT Assessment Rehab Potential (ACUTE ONLY): Excellent OT Barriers to Discharge: Other (comments) (n/a, has ramp to enter and will have 24/7 support from wife) OT Patient demonstrates impairments in the following area(s): Balance;Safety;Perception;Cognition;Endurance;Motor;Pain OT Basic ADL's Functional  Problem(s): Grooming;Bathing;Dressing;Toileting OT Advanced ADL's Functional Problem(s): Simple Meal Preparation OT Transfers Functional Problem(s): Toilet;Tub/Shower OT  Additional Impairment(s): Fuctional Use of Upper Extremity OT Plan OT Intensity: Minimum of 1-2 x/day, 45 to 90 minutes OT Frequency: 5 out of 7 days OT Duration/Estimated Length of Stay: 12-14 days OT Treatment/Interventions: Balance/vestibular training;Discharge planning;Functional electrical stimulation;Pain management;Self Care/advanced ADL retraining;Therapeutic Activities;UE/LE Coordination activities;Visual/perceptual remediation/compensation;Therapeutic Exercise;Patient/family education;Functional mobility training;Disease mangement/prevention;Community reintegration;DME/adaptive equipment instruction;Neuromuscular re-education;Psychosocial support;UE/LE Strength taining/ROM;Wheelchair propulsion/positioning;Splinting/orthotics OT Self Feeding Anticipated Outcome(s): No goal OT Basic Self-Care Anticipated Outcome(s): Supervision-Min A OT Toileting Anticipated Outcome(s): Min A OT Bathroom Transfers Anticipated Outcome(s): Supervision OT Recommendation Recommendations for Other Services: Neuropsych consult (pt a little teary during session regarding his current medical journey/rehab process) Patient destination: Home Follow Up Recommendations: 24 hour supervision/assistance;Home health OT Equipment Recommended: To be determined   OT Evaluation Precautions/Restrictions  Precautions Precautions: Fall Precaution Comments: Lt inattention, Lt hemiparesis UE>LE Home Living/Prior [place] expects to be discharged to:: Private residence Living Arrangements: Spouse/significant other Available Help at Discharge: Family,Available 24 hours/day Type of Home: House Home Access: Ramped entrance Home Layout: One level Bathroom Shower/Tub: Multimedia programmer: Standard Bathroom  Accessibility: Yes  Lives With: Spouse IADL History Homemaking Responsibilities:  (Pt reported completing light meal prep PTA, mostly worked in the yard and spent time in his [place]. Was driving and running errands, used a cane for community mobility. Spouse took care of cleaning responsibilities.) Occupation: Retired Type of Occupation: worked in Engineer, maintenance for 59 years Leisure and Hobbies: Woodworking Prior Function Level of Independence: Independent with basic ADLs,Independent with homemaking with ambulation Driving: Yes Vision Baseline Vision/History: Wears glasses Wears Glasses: Reading only Perception  Perception: Impaired Inattention/Neglect: Does not attend to left visual field;Does not attend to left side of body Praxis Praxis: Intact Cognition Arousal/Alertness: Awake/alert Orientation Level: Person;Place;Situation Person: Oriented Place: Oriented Situation: Oriented Year: 2022 Month: April Day of Week: Correct Immediate Memory Recall: Sock;Bed Memory Recall Sock: Without Cue Memory Recall Blue: Without Cue Memory Recall Bed: Not able to recall Awareness: Impaired (Lt inattention) Safety/Judgment: Impaired (due to significant Lt inattention) Sensation Coordination Gross Motor Movements are Fluid and Coordinated: No Fine Motor Movements are Fluid and Coordinated: No Coordination and Movement Description: Lt hemiparesis UE>LE Finger Nose Finger Test: increased force with end range movement on the Rt side, unable to complete on the Lt Motor  Motor Motor: Abnormal tone;Abnormal postural alignment and control;Hemiplegia  Trunk/Postural Assessment  Cervical Assessment Cervical Assessment: Exceptions to [facility] (forward head) Thoracic Assessment Thoracic Assessment: Exceptions to [facility] (rounded shoulders) Lumbar Assessment Lumbar Assessment: Exceptions to [facility] (posterior pelvic tilt) Postural Control Postural Control: Deficits on evaluation (Lt lean in  sitting/standing, seated LOBs during dynamic activity with OT providing Mod balance assist while pt helped with donning 1 gripper sock)  Balance Balance Balance Assessed: Yes Dynamic Sitting Balance Dynamic Sitting - Balance Support: No upper extremity supported;During functional activity Dynamic Sitting - Level of Assistance: 3: Mod assist (donning gripper socks EOB) Dynamic Standing Balance Dynamic Standing - Balance Support: During functional activity;No upper extremity supported Dynamic Standing - Level of Assistance: 3: Mod assist Dynamic Standing - Balance Activities: Lateral lean/weight shifting;Forward lean/weight shifting (elevating clothing post toileting) Extremity/Trunk Assessment RUE Assessment RUE Assessment: Within Functional Limits Active Range of Motion (AROM) Comments: WNL LUE Assessment LUE Assessment: Exceptions to [facility] LUE Body System: Neuro Brunstrum levels for arm and hand: Arm;Hand Brunstrum level for arm: Stage IV Movement is deviating from synergy Brunstrum level for hand: Stage II Synergy is developing (developing flexor synergy) LUE Tone LUE Tone: Mild;Hypertonic  Care Tool Care Tool Self  Care Eating   Eating Assist Level: Supervision/Verbal cueing    Oral Care    Oral Care Assist Level: Moderate Assistance - Patient 50 - 74%    Bathing   Body parts bathed by patient: Chest;Abdomen;Front perineal area;Buttocks;Right upper leg;Left upper leg;Face Body parts bathed by helper: Right arm;Left arm;Right lower leg;Left lower leg   Assist Level: Moderate Assistance - Patient 50 - 74%    Upper Body Dressing(including orthotics)   What is the patient wearing?: Pull over shirt   Assist Level: Maximal Assistance - Patient 25 - 49%    Lower Body Dressing (excluding footwear)   What is the patient wearing?: Incontinence brief;Pants Assist for lower body dressing: Moderate Assistance - Patient 50 - 74%    Putting on/Taking off footwear   What is the  patient wearing?: Non-skid slipper socks;Ted hose Assist for footwear: Maximal Assistance - Patient 25 - 49%       Care Tool Toileting Toileting activity   Assist for toileting: Moderate Assistance - Patient 50 - 74%       Care Tool Transfers Sit to stand transfer   Sit to stand assist level: Minimal Assistance - Patient > 75%        Toilet transfer   Assist Level: Moderate Assistance - Patient 50 - 74%       Refer to Care Plan for Long Term Goals  SHORT TERM GOAL WEEK 1 OT Short Term Goal 1 (Week 1): Pt complete LB dressing with Mod A using compensatory strategies as needed OT Short Term Goal 2 (Week 1): Pt will complete UB dressing with Mod A and hemi techniques OT Short Term Goal 3 (Week 1): Pt will complete toilet transfer with Min A and LRAD  Recommendations for other services: Neuropsych and Surveyor, [employer]   Skilled Therapeutic Intervention Skilled OT session completed with focus on initial evaluation, education on OT role/POC, and establishment of patient-centered goals  Pt greeted in bed with no c/o pain. NT present to supervise pt while he ate breakfast. OT resumed with supervising pt, pt needing cues to recognize spillage on Lt side of mouth and also when his food spilled onto his Lt hand. Cues to find his napkin on the Lt side of his tray. Pt with 1 spell of coughing when consuming his nectar thickened milk. Mod A for supine<sit after. Sit<stand with Mod A and vcs before pt pivoted to the [facility] using the hemi walker. Pt then bathed while sitting on BSC, as he was unsure if he needed to void. Bathing/dressing tasks completed mostly from [facility] at sit<stand level using hemi walker for standing support. Mod A for dynamic standing balance due to Lt lean. The same assistance required for dynamic sitting when he donned gripper socks while seated EOB prior to transfer. HOH to use his Lt hand functionally during all tasks, pt with proximal>distal return in his  affected UE. He completed a stand step transfer over to the recliner afterwards. He remained sitting in the recliner with all needs within reach and chair alarm set. Lt UE elevated and supported and k-pad for Rt shoulder, which pt reported was a little sore. K-pad manageable for pain mgt per pt report.  ADL ADL Eating: Not assessed Grooming: Moderate assistance Where Assessed-Grooming: Edge of bed Upper Body Bathing: Moderate assistance Where Assessed-Upper Body Bathing: Edge of bed Lower Body Bathing: Moderate assistance Where Assessed-Lower Body Bathing: Edge of bed;Other (Comment) (feet washed EOB, remainder of bathing completed sit<stand from [facility])  Upper Body Dressing: Maximal assistance Where Assessed-Upper Body Dressing: Other (Comment) (sitting on BSC) Lower Body Dressing: Maximal assistance Toileting: Moderate assistance Where Assessed-Toileting: Bedside Commode Toilet Transfer: Moderate assistance Toilet Transfer Method: Stand pivot (hemi walker) Toilet Transfer Equipment: Bedside commode Tub/Shower Transfer: Not assessed   Discharge Criteria: Patient will be discharged from OT if patient refuses treatment 3 consecutive times without medical reason, if treatment goals not met, if there is a change in medical status, if patient makes no progress towards goals or if patient is discharged from hospital.  The above assessment, treatment plan, treatment alternatives and goals were discussed and mutually agreed upon: by patient  Skeet Simmer 05/15/2020, 12:50 PM

## 2020-05-15 NOTE — Plan of Care (Signed)
  Problem: Sit to Stand Goal: LTG:  Patient will perform sit to stand in prep for activites of daily living with assistance level (OT) Description: LTG:  Patient will perform sit to stand in prep for activites of daily living with assistance level (OT) Flowsheets (Taken 05/15/2020 1604) LTG: PT will perform sit to stand in prep for activites of daily living with assistance level: Supervision/Verbal cueing   Problem: RH Grooming Goal: LTG Patient will perform grooming w/assist,cues/equip (OT) Description: LTG: Patient will perform grooming with assist, with/without cues using equipment (OT) Flowsheets (Taken 05/15/2020 1604) LTG: Pt will perform grooming with assistance level of: Supervision/Verbal cueing   Problem: RH Bathing Goal: LTG Patient will bathe all body parts with assist levels (OT) Description: LTG: Patient will bathe all body parts with assist levels (OT) Flowsheets (Taken 05/15/2020 1604) LTG: Pt will perform bathing with assistance level/cueing: Minimal Assistance - Patient > 75%   Problem: RH Dressing Goal: LTG Patient will perform upper body dressing (OT) Description: LTG Patient will perform upper body dressing with assist, with/without cues (OT). Flowsheets (Taken 05/15/2020 1604) LTG: Pt will perform upper body dressing with assistance level of: Supervision/Verbal cueing Goal: LTG Patient will perform lower body dressing w/assist (OT) Description: LTG: Patient will perform lower body dressing with assist, with/without cues in positioning using equipment (OT) Flowsheets (Taken 05/15/2020 1604) LTG: Pt will perform lower body dressing with assistance level of: (excluding Teds) Minimal Assistance - Patient > 75%   Problem: RH Toileting Goal: LTG Patient will perform toileting task (3/3 steps) with assistance level (OT) Description: LTG: Patient will perform toileting task (3/3 steps) with assistance level (OT)  Flowsheets (Taken 05/15/2020 1604) LTG: Pt will perform  toileting task (3/3 steps) with assistance level: Minimal Assistance - Patient > 75%   Problem: RH Toilet Transfers Goal: LTG Patient will perform toilet transfers w/assist (OT) Description: LTG: Patient will perform toilet transfers with assist, with/without cues using equipment (OT) Flowsheets (Taken 05/15/2020 1604) LTG: Pt will perform toilet transfers with assistance level of: Supervision/Verbal cueing   Problem: RH Tub/Shower Transfers Goal: LTG Patient will perform tub/shower transfers w/assist (OT) Description: LTG: Patient will perform tub/shower transfers with assist, with/without cues using equipment (OT) Flowsheets (Taken 05/15/2020 1604) LTG: Pt will perform tub/shower stall transfers with assistance level of: Supervision/Verbal cueing

## 2020-05-16 DIAGNOSIS — I63511 Cerebral infarction due to unspecified occlusion or stenosis of right middle cerebral artery: Secondary | ICD-10-CM | POA: Diagnosis not present

## 2020-05-16 LAB — GLUCOSE, CAPILLARY
Glucose-Capillary: 234 mg/dL — ABNORMAL HIGH (ref 70–99)
Glucose-Capillary: 282 mg/dL — ABNORMAL HIGH (ref 70–99)
Glucose-Capillary: 161 mg/dL — ABNORMAL HIGH (ref 70–99)
Glucose-Capillary: 202 mg/dL — ABNORMAL HIGH (ref 70–99)

## 2020-05-16 MED ORDER — INSULIN GLARGINE 100 UNIT/ML ~~LOC~~ SOLN
44.0000 [IU] | Freq: Every day | SUBCUTANEOUS | Status: DC
  Administered 2020-05-17 – 2020-05-19 (×3): 44 [IU] via SUBCUTANEOUS
  Filled 2020-05-16 (×4): qty 0.44

## 2020-05-16 MED ORDER — TRAZODONE HCL 50 MG PO TABS
50.0000 mg | ORAL_TABLET | Freq: Every day | ORAL | Status: DC
  Administered 2020-05-16 – 2020-05-24 (×9): 50 mg via ORAL
  Filled 2020-05-16 (×9): qty 1

## 2020-05-16 MED ORDER — LIDOCAINE 5 % EX PTCH
1.0000 | MEDICATED_PATCH | CUTANEOUS | Status: DC
  Administered 2020-05-16 – 2020-05-27 (×12): 1 via TRANSDERMAL
  Filled 2020-05-16 (×12): qty 1

## 2020-05-16 MED ORDER — ONDANSETRON HCL 4 MG PO TABS
4.0000 mg | ORAL_TABLET | Freq: Three times a day (TID) | ORAL | Status: DC | PRN
  Administered 2020-05-16: 4 mg via ORAL
  Filled 2020-05-16: qty 1

## 2020-05-16 NOTE — Progress Notes (Signed)
PROGRESS NOTE   Subjective/Complaints: C/o right shoulder pain, insomnia Had 2 BM yesterday Denies other complaints. Therapy went very well yesterday!  Objective:   EP PPM/ICD IMPLANT  Result Date: 05/14/2020 SURGEON:  Allegra Lai, MD   PREPROCEDURE DIAGNOSIS:  Cryptogenic Stroke   POSTPROCEDURE DIAGNOSIS:  Cryptogenic Stroke    PROCEDURES:  1. Implantable loop recorder implantation   INTRODUCTION:  Thomas Sampson is a 75 y.o. male with a history of unexplained stroke who presents today for implantable loop implantation.  The patient has had a cryptogenic stroke.  Despite an extensive workup by neurology, no reversible causes have been identified.  he has worn telemetry during which he did not have arrhythmias.  There is significant concern for possible atrial fibrillation as the cause for the patients stroke.  The patient therefore presents today for implantable loop implantation.   DESCRIPTION OF PROCEDURE:  Informed written consent was obtained, and the patient was brought to the electrophysiology lab in a fasting state.  The patient required no sedation for the procedure today.  Mapping over the patient's chest was performed by the EP lab staff to identify the area where electrograms were most prominent for ILR recording.  This area was found to be the left parasternal region over the 3rd-4th intercostal space. The patients left chest was therefore prepped and draped in the usual sterile fashion by the EP lab staff. The skin overlying the left parasternal region was infiltrated with lidocaine for local analgesia.  A 0.5-cm incision was made over the left parasternal region over the 3rd intercostal space.  A subcutaneous ILR pocket was fashioned using a combination of sharp and blunt dissection.  A Medtronic Reveal Linq model [place] [id number] implantable loop recorder was then placed into the pocket  R waves were very prominent and measured  0.33mV. EBL<1 ml.  Steri- Strips and a sterile dressing were then applied.  There were no early apparent complications.   CONCLUSIONS:  1. Successful implantation of a Medtronic Reveal LINQ implantable loop recorder for cryptogenic stroke  2. No early apparent complications.   Recent Labs    05/14/20 0209  WBC 10.3  HGB 11.7*  HCT 35.7*  PLT 194   Recent Labs    05/14/20 0209  NA 140  K 3.5  CL 109  CO2 27  GLUCOSE 176*  BUN 24*  CREATININE 1.82*  CALCIUM 8.2*    Intake/Output Summary (Last 24 hours) at 05/16/2020 0856 Last data filed at 05/16/2020 0721 Gross per 24 hour  Intake 472 ml  Output 1150 ml  Net -678 ml        Physical Exam: Vital Signs Blood pressure 124/75, pulse 64, temperature 98.3 F (36.8 C), temperature source Oral, resp. rate 20, weight 99.4 kg, SpO2 96 %. Gen: no distress, normal appearing HEENT: oral mucosa pink and moist Gen: no distress, normal appearing HEENT: oral mucosa pink and moist, NCAT Cardio: Reg rate Chest: normal effort, normal rate of breathing Abd: soft, non-distended Ext: no edema Psych: pleasant, normal affect Skin: intact Musculoskeletal:  General: Swellingand tendernesspresent.  Cervical back: Normal range of motion.  Comments: Mild right and left shoulder tenderness with AROM/PROM Skin: General:  Skin is warmand dry.  Neurological:  Mental Status: He is alert.  Comments: Patient is alert. No acute distress. He does exhibit some left inattention. Speech is dysarthric but intelligible. Follows commands. Left central 7 and tongue deviation. Senses pain and light touch left face,arm, leg. DTR's 2+ LUE and LLE and 1+ on right. LUE: 1/5 delt, pec, biceps and 0/5 elsewhere. LLE: 3 to 3+/5 HF, KE and ADF/PF. Psychiatric:  Comments: Pleasant, pseudobulbar affect   Assessment/Plan: 1. Functional deficits which require 3+ hours per day of interdisciplinary therapy in a comprehensive inpatient  rehab setting.  Physiatrist is providing close team supervision and 24 hour management of active medical problems listed below.  Physiatrist and rehab team continue to assess barriers to discharge/monitor patient progress toward functional and medical goals  Care Tool:  Bathing    Body parts bathed by patient: Chest,Abdomen,Front perineal area,Buttocks,Right upper leg,Left upper leg,Face   Body parts bathed by helper: Right arm,Left arm,Right lower leg,Left lower leg     Bathing assist Assist Level: Moderate Assistance - Patient 50 - 74%     Upper Body Dressing/Undressing Upper body dressing   What is the patient wearing?: Pull over shirt    Upper body assist Assist Level: Maximal Assistance - Patient 25 - 49%    Lower Body Dressing/Undressing Lower body dressing      What is the patient wearing?: Incontinence brief,Pants     Lower body assist Assist for lower body dressing: Moderate Assistance - Patient 50 - 74%     Toileting Toileting    Toileting assist Assist for toileting: 2 Helpers (patient voided in brief nurse applied primo fit for the night)     Transfers Chair/bed transfer  Transfers assist     Chair/bed transfer assist level: Minimal Assistance - Patient > 75%     Locomotion Ambulation   Ambulation assist      Assist level: Minimal Assistance - Patient > 75% Assistive device: Hand held assist Max distance: 80   Walk 10 feet activity   Assist     Assist level: Minimal Assistance - Patient > 75% Assistive device: Hand held assist   Walk 50 feet activity   Assist    Assist level: Minimal Assistance - Patient > 75%      Walk 150 feet activity   Assist Walk 150 feet activity did not occur: Safety/medical concerns         Walk 10 feet on uneven surface  activity   Assist     Assist level: Moderate Assistance - Patient - 50 - 74% Assistive device: Hand held assist   Wheelchair     Assist   Type of Wheelchair:  Manual    Wheelchair assist level: Minimal Assistance - Patient > 75% Max wheelchair distance: 150    Wheelchair 50 feet with 2 turns activity    Assist        Assist Level: Minimal Assistance - Patient > 75%   Wheelchair 150 feet activity     Assist      Assist Level: Minimal Assistance - Patient > 75%   Blood pressure 124/75, pulse 64, temperature 98.3 F (36.8 C), temperature source Oral, resp. rate 20, weight 99.4 kg, SpO2 96 %.  Medical Problem List and Plan: 1.Left hemiparesis/dysarthria/dysphagia and functional deficitssecondary to acute moderate size right MCA infarction due to occlusion of the right M3 branch superior division status post thrombectomy -patient may shower -ELOS/Goals: 14-20 days, supervision PT, sup/min OT, mod I SLP  -Continue  CIR 2.  Impaired mobility:  -DVT/anticoagulation:SCDs -antiplatelet therapy: continue Aspirin and Plavix 75 mg daily x3 months then Plavix alone 3. Pain Management:Denies neuropathic pain: decrease Lyrica to 25 mg twice daily. Add lidocaine patch for right shoulder.  4. Mood:Provide emotional support -antipsychotic agents: N/A -add nuedexta for PBA. Discussed with pt/wife who would like to try it. 5. Neuropsych: This patientiscapable of making decisions on hisown behalf. 6. Skin/Wound Care:Routine skin checks 7. Fluids/Electrolytes/Nutrition:Routine in and outs with follow-up chemistrieson Monday 8. Dysphagia. Dysphagia #1 nectar liquids. Follow-up speech therapy 9. CKD stage IV. Admission BUN 2.26. Follow-up chemistries 10. Diabetes mellitus with peripheral neuropathy. Hemoglobin A1c 8.1.  -NovoLog 8 units 3 times daily -increase Lantus to 43 U -Check blood sugars before meals and at bedtime  4/17: CBGs 211-376: Cr elevated. Increase Lantus to 44U 11. Hypertension. Well controlled, continue  Norvasc 2.5 mg daily. Monitor with increased mobility on rehab 12. Hyperlipidemia. Lipitor 13.Tobacco abuse.Counseling 14. Obesity BMI 32.36: provide counseling    LOS: 2 days A FACE TO FACE EVALUATION WAS PERFORMED  Thomas Sampson 05/16/2020, 8:56 AM

## 2020-05-16 NOTE — Plan of Care (Signed)
  Problem: Consults Goal: RH STROKE PATIENT EDUCATION Description: See Patient Education module for education specifics  Outcome: Progressing Goal: Nutrition Consult-if indicated Outcome: Progressing Goal: Diabetes Guidelines if Diabetic/Glucose > 140 Description: If diabetic or lab glucose is > 140 mg/dl - Initiate Diabetes/Hyperglycemia Guidelines & Document Interventions  Outcome: Progressing   Problem: RH BOWEL ELIMINATION Goal: RH STG MANAGE BOWEL WITH ASSISTANCE Description: STG Manage Bowel with min Assistance. Outcome: Progressing Goal: RH STG MANAGE BOWEL W/MEDICATION W/ASSISTANCE Description: STG Manage Bowel with Medication with min Assistance. Outcome: Progressing   Problem: RH BLADDER ELIMINATION Goal: RH STG MANAGE BLADDER WITH ASSISTANCE Description: STG Manage Bladder With min Assistance Outcome: Progressing Goal: RH STG MANAGE BLADDER WITH EQUIPMENT WITH ASSISTANCE Description: STG Manage Bladder With Equipment With min Assistance Outcome: Progressing   Problem: RH SKIN INTEGRITY Goal: RH STG SKIN FREE OF INFECTION/BREAKDOWN Outcome: Progressing Goal: RH STG MAINTAIN SKIN INTEGRITY WITH ASSISTANCE Description: STG Maintain Skin Integrity With min Assistance. Outcome: Progressing Goal: RH STG ABLE TO PERFORM INCISION/WOUND CARE W/ASSISTANCE Description: STG Able To Perform Incision/Wound Care With min Assistance. Outcome: Progressing   Problem: RH SAFETY Goal: RH STG ADHERE TO SAFETY PRECAUTIONS W/ASSISTANCE/DEVICE Description: STG Adhere to Safety Precautions With min Assistance/Device. Outcome: Progressing Goal: RH STG DECREASED RISK OF FALL WITH ASSISTANCE Description: STG Decreased Risk of Fall With min Assistance. Outcome: Progressing   Problem: RH COGNITION-NURSING Goal: RH STG USES MEMORY AIDS/STRATEGIES W/ASSIST TO PROBLEM SOLVE Description: STG Uses Memory Aids/Strategies With min Assistance to Problem Solve. Outcome: Progressing Goal: RH STG  ANTICIPATES NEEDS/CALLS FOR ASSIST W/ASSIST/CUES Description: STG Anticipates Needs/Calls for Assist With min Assistance/Cues. Outcome: Progressing   Problem: RH PAIN MANAGEMENT Goal: RH STG PAIN MANAGED AT OR BELOW PT'S PAIN GOAL Description: <3 out of 10 on pain scale Outcome: Progressing   Problem: RH KNOWLEDGE DEFICIT Goal: RH STG INCREASE KNOWLEDGE OF DIABETES Description: Patient will be able to manage DM with medications and dietary modifications using handouts and educational tools with min assist  Outcome: Progressing Goal: RH STG INCREASE KNOWLEDGE OF HYPERTENSION Description: Patient will be able to manage HTN with medications and dietary modifications using handouts and educational tools with min assist  Outcome: Progressing Goal: RH STG INCREASE KNOWLEDGE OF DYSPHAGIA/FLUID INTAKE Description: Patient will be able to manage Dysphagia: nutrition and dietary modifications using handouts and educational tools with min assist  Outcome: Progressing Goal: RH STG INCREASE KNOWLEGDE OF HYPERLIPIDEMIA Description: Patient will be able to manage HLD with medications and dietary modifications using handouts and educational tools with min assist  Outcome: Progressing Goal: RH STG INCREASE KNOWLEDGE OF STROKE PROPHYLAXIS Description: Patient will be able to manage secondary stroke risks with medications and dietary modifications using handouts and educational tools with min assist Outcome: Progressing

## 2020-05-17 ENCOUNTER — Inpatient Hospital Stay (HOSPITAL_COMMUNITY): Payer: Medicare Other

## 2020-05-17 ENCOUNTER — Encounter (HOSPITAL_COMMUNITY): Payer: Self-pay | Admitting: Cardiology

## 2020-05-17 DIAGNOSIS — I63511 Cerebral infarction due to unspecified occlusion or stenosis of right middle cerebral artery: Secondary | ICD-10-CM | POA: Diagnosis not present

## 2020-05-17 LAB — CBC WITH DIFFERENTIAL/PLATELET
Abs Immature Granulocytes: 0.21 10*3/uL — ABNORMAL HIGH (ref 0.00–0.07)
Basophils Absolute: 0.1 10*3/uL (ref 0.0–0.1)
Basophils Relative: 1 %
Eosinophils Absolute: 0.4 10*3/uL (ref 0.0–0.5)
Eosinophils Relative: 4 %
HCT: 40.2 % (ref 39.0–52.0)
Hemoglobin: 13.2 g/dL (ref 13.0–17.0)
Immature Granulocytes: 2 %
Lymphocytes Relative: 15 %
Lymphs Abs: 1.8 10*3/uL (ref 0.7–4.0)
MCH: 31.3 pg (ref 26.0–34.0)
MCHC: 32.8 g/dL (ref 30.0–36.0)
MCV: 95.3 fL (ref 80.0–100.0)
Monocytes Absolute: 1 10*3/uL (ref 0.1–1.0)
Monocytes Relative: 8 %
Neutro Abs: 8.1 10*3/uL — ABNORMAL HIGH (ref 1.7–7.7)
Neutrophils Relative %: 70 %
Platelets: 226 10*3/uL (ref 150–400)
RBC: 4.22 MIL/uL (ref 4.22–5.81)
RDW: 13.3 % (ref 11.5–15.5)
WBC: 11.5 10*3/uL — ABNORMAL HIGH (ref 4.0–10.5)
nRBC: 0 % (ref 0.0–0.2)

## 2020-05-17 LAB — GLUCOSE, CAPILLARY
Glucose-Capillary: 195 mg/dL — ABNORMAL HIGH (ref 70–99)
Glucose-Capillary: 284 mg/dL — ABNORMAL HIGH (ref 70–99)
Glucose-Capillary: 194 mg/dL — ABNORMAL HIGH (ref 70–99)
Glucose-Capillary: 113 mg/dL — ABNORMAL HIGH (ref 70–99)

## 2020-05-17 LAB — COMPREHENSIVE METABOLIC PANEL
ALT: 10 U/L (ref 0–44)
AST: 21 U/L (ref 15–41)
Albumin: 3.2 g/dL — ABNORMAL LOW (ref 3.5–5.0)
Alkaline Phosphatase: 57 U/L (ref 38–126)
Anion gap: 7 (ref 5–15)
BUN: 28 mg/dL — ABNORMAL HIGH (ref 8–23)
CO2: 27 mmol/L (ref 22–32)
Calcium: 8.6 mg/dL — ABNORMAL LOW (ref 8.9–10.3)
Chloride: 107 mmol/L (ref 98–111)
Creatinine, Ser: 1.97 mg/dL — ABNORMAL HIGH (ref 0.61–1.24)
GFR, Estimated: 35 mL/min — ABNORMAL LOW (ref >60)
Glucose, Bld: 231 mg/dL — ABNORMAL HIGH (ref 70–99)
Potassium: 3.9 mmol/L (ref 3.5–5.1)
Sodium: 141 mmol/L (ref 135–145)
Total Bilirubin: 1 mg/dL (ref 0.3–1.2)
Total Protein: 6 g/dL — ABNORMAL LOW (ref 6.5–8.1)

## 2020-05-17 IMAGING — DX DG SHOULDER 2+V*R*
3 series · 3 of 3 positions shown · non-contrast
Comparison: None.

CLINICAL DATA: Right shoulder pain.

EXAM:
RIGHT SHOULDER - 2+ VIEW

[shoulder y view]
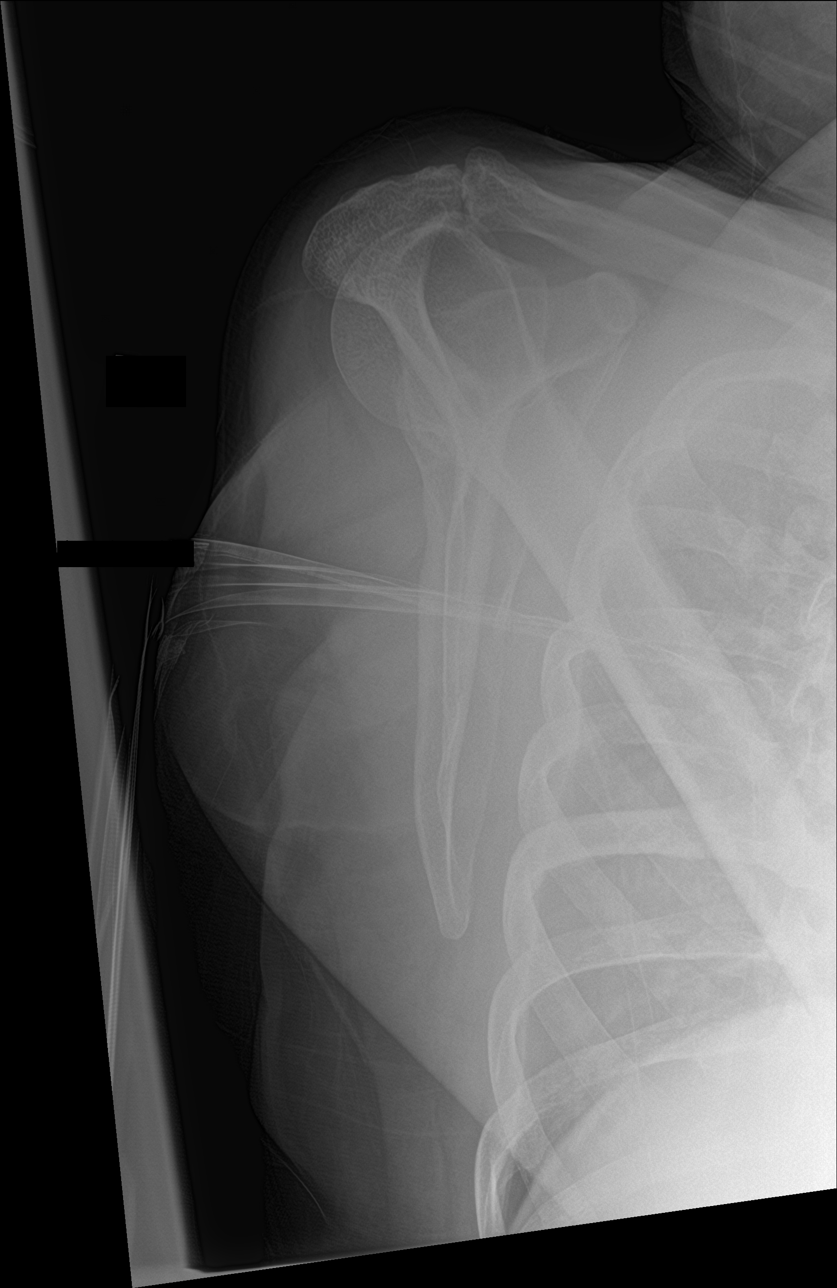

[shoulder ap neutral]
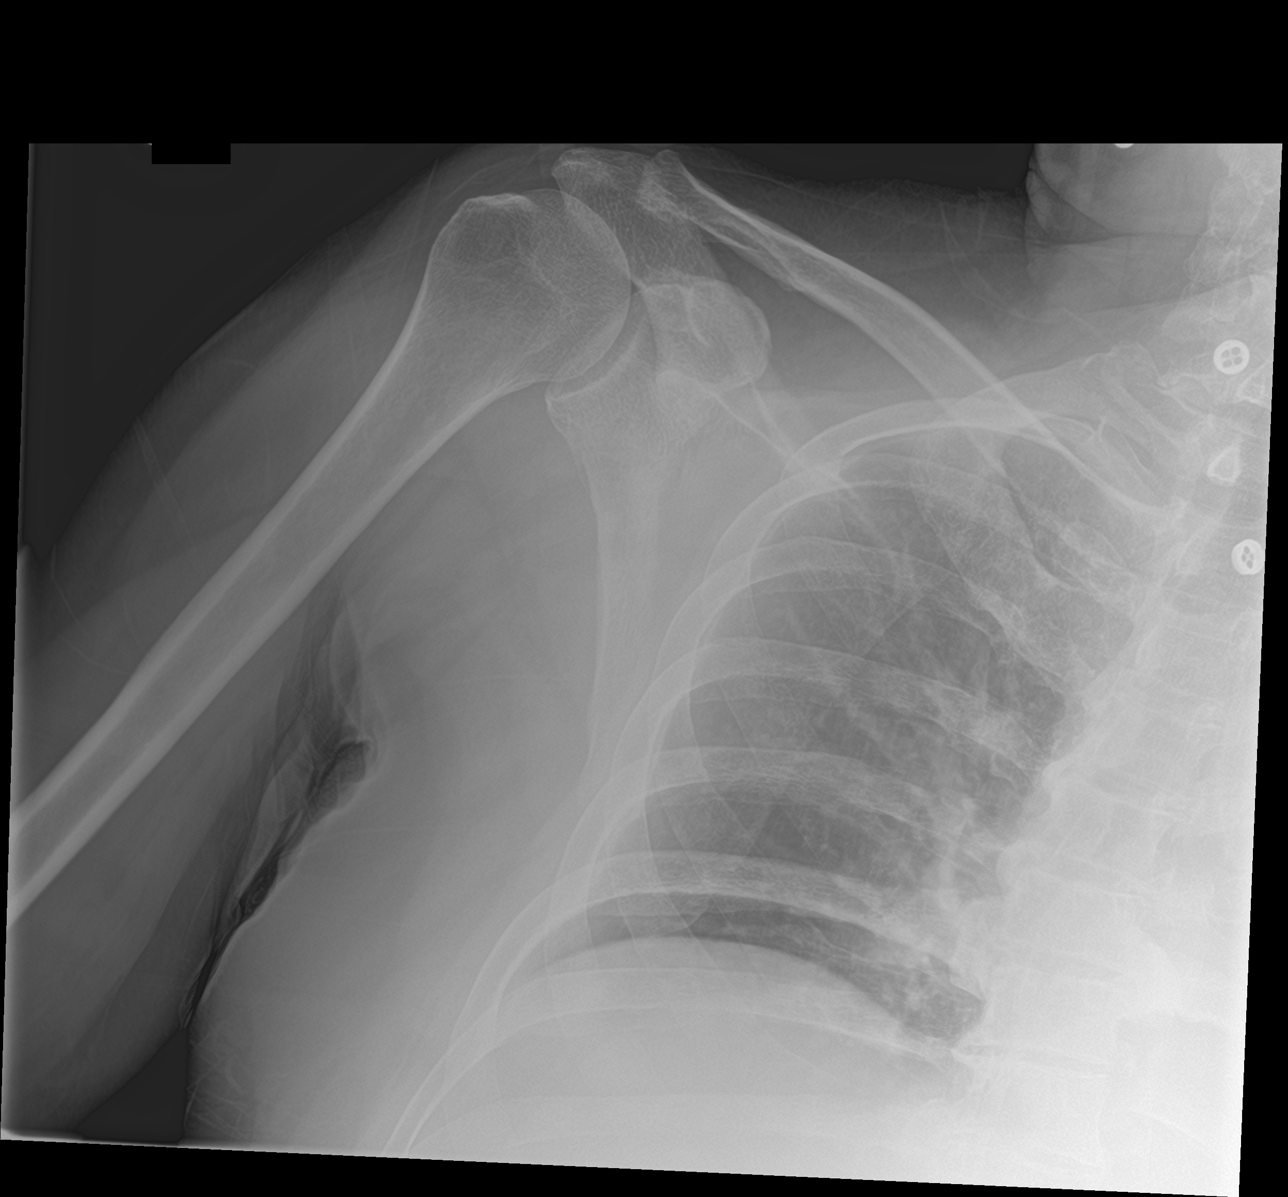

[shoulder grashey]
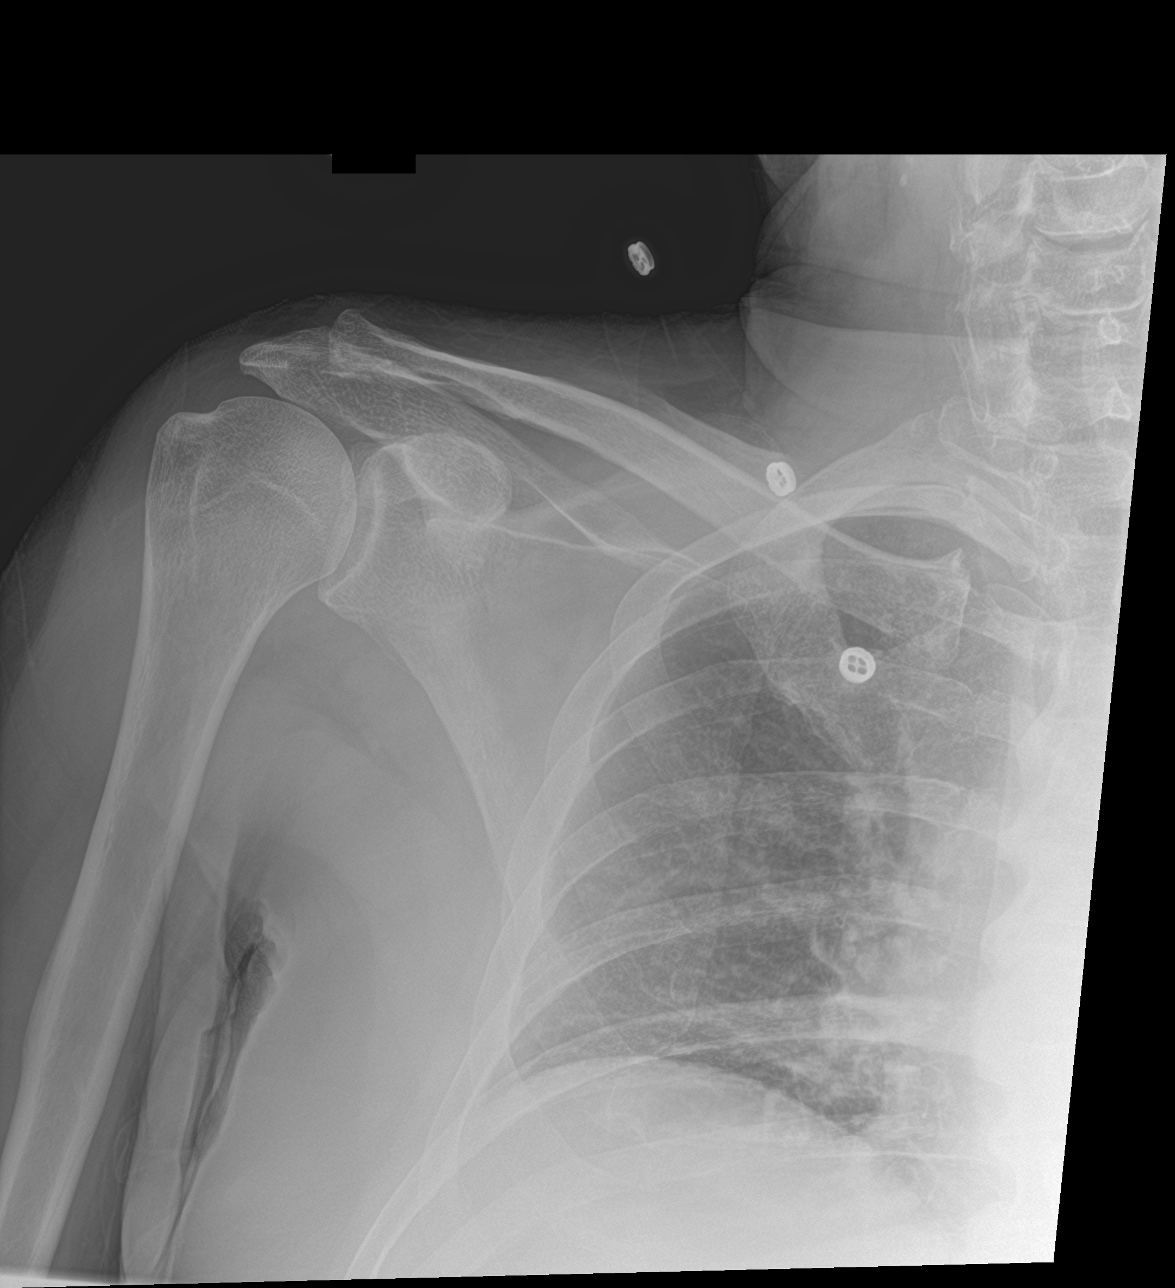

[3 of 3 positions shown; findings below may reference images not displayed]

FINDINGS: There is no evidence of fracture or dislocation. Moderate
degenerative changes seen involving the right acromioclavicular
joint. Soft tissues are unremarkable.
IMPRESSION: Moderate osteoarthritis of the right acromioclavicular joint. No
acute abnormality seen in the right shoulder.

## 2020-05-17 NOTE — Progress Notes (Signed)
Physical Therapy Session Note  Patient Details  Name: Thomas Sampson MRN: [id number] Date of Birth: 22-Jan-1946  Today's Date: 05/17/2020 PT Individual Time: 1420-1535 PT Individual Time Calculation (min): 75 min   Short Term Goals: Week 1:  PT Short Term Goal 1 (Week 1): Pt will perform bed mobility with supervisoin assist PT Short Term Goal 2 (Week 1): Pt will ambulate 157ft with min assist PT Short Term Goal 3 (Week 1): Pt will transfer to and from [facility] with CGA PT Short Term Goal 4 (Week 1): Pt will propell WC 170ft wtih supervision assist  Skilled Therapeutic Interventions/Progress Updates:  Patient seated upright in armchair at hi/lo table and completing OT session on PT arrival to therapy gym. Patient alert and agreeable to PT session. Patient denied pain during session. Giv-Mohr sling donned at start of session for improved shoulder support with upright activities.   Therapeutic Activity: Transfers: Patient performed STS  to no AD throughout session from various surfaces with supervision. Demos good safety awareness and able to remember reasons provided for reaching back for armrest of seat prior to descent to sit. Provided verbal cues for technique especially with fatigue.  Gait Training:  Patient ambulated >200 feet starting with use of hemiwalker and CGA. Provided with vc for correction of forward flexed posture, walker positioning, increased step height/ length with LLE. Pt able to make corrections requiring minimal cues for consistent corrections throughout. Pt transitioned to amb with no AD at midway point with R hallway handrail available prn. Smooth transition with minimal change in quality of gait. Fatigue noted at ~200 feet with some minor breakdown of quality of gait and one minor LOB with pt able to correct with stepping strategy and Min A from therapist.   Pt guided in use of hemiwalker for ambulating up/ down ramp and through simulation turf/ uneven grass surface. Pt  completes all with CGA and vc throughout for maintaining control of balance and pace, L foot clearance, walker and step placement with flooring transitions and upright posture throughout.  Neuromuscular Re-ed: NMR facilitated during session with focus on static standing balance. Pt guided in BLE weight shifting on solid floor, on Airex pad, and on mildly ramped liable surface. Pt guided in self perturbations as well as guided in maintaining balance with PT provided perturbations. Pt able to maintain balance throughout with mostly CGA and intermittent Min A until he faitgues. With fatigue, pt noted to melt to L side especially with extended time on pliant surfaces and requires Min to Mod A to return to midline orientation. Able to step on and off surfaces using HW and CGA.  NMR performed for improvements in motor control and coordination, balance, sequencing, judgement, and self confidence/ efficacy in performing all aspects of mobility at highest level of independence.   Therapeutic Activity: Bed Mobility: At end of session, pt performs sit--> supine with supervision using R bedrail for UE support. VC provided for technique.   Patient supine in bed at end of session with bed alarm set, and all needs within reach. Pt's wife and brother in room with questions re: progress seen and effort in therapy. They are happy with his improved mood and mobility they can already see this day.      Therapy Documentation Precautions:  Precautions Precautions: Fall Precaution Comments: Lt inattention, Lt hemiparesis UE>LE Restrictions Weight Bearing Restrictions: No  Therapy/Group: Individual Therapy  Alger Simons PT, DPT 05/17/2020, 4:35 PM

## 2020-05-17 NOTE — Progress Notes (Signed)
Occupational Therapy Session Note  Patient Details  Name: Thomas Sampson MRN: [id number] Date of Birth: 05-19-1945  Today's Date: 05/17/2020 OT Individual Time: 3536-1443 OT Individual Time Calculation (min): 70 min    Short Term Goals: Week 1:  OT Short Term Goal 1 (Week 1): Pt complete LB dressing with Mod A using compensatory strategies as needed OT Short Term Goal 2 (Week 1): Pt will complete UB dressing with Mod A and hemi techniques OT Short Term Goal 3 (Week 1): Pt will complete toilet transfer with Min A and LRAD  Skilled Therapeutic Interventions/Progress Updates:    Pt seen this session for LUE facilitation with BADLs and NMR activities. Pt received in bed and was able to sit to EOB easily with only min A. Min to stand to [employer] and then ambulate to bathroom. Completed toileting with min A for clothing management. Pt sat at EOB to dress with min A overall, following hemidressing techniques well.   Transported to gym with 3 quick stops to adjust his IV with tape, get a half lap tray for his chair and to retrieve a GIve Mohr sling to support his L arm with ambulation. Pt stood in gym for therapist to adjust his sling and then pt practiced ambulating with hemiwalker. He felt the sling took a lot of weight off of his L arm and felt ambulation was much easier.  Then sat in arm chair at table to work on a/arom of LUE with towel slides. Pt achieved a good amount of active movement with  Gravity eliminated for horizontal abd/add, elbow flex and ext, sh prot/retraction. He has some active thumb, so provided him with soft yellow foam block to continue to practice with.  Hand over hand guidance with grasping and stacking cones.    Pt's PT about to arrive in a few minutes, so pt continued to sit at table working on his AROM table top slides.  Supervision from other therapists in the room.   Excellent participation.   Therapy Documentation Precautions:  Precautions Precautions:  Fall Precaution Comments: Lt inattention, Lt hemiparesis UE>LE Restrictions Weight Bearing Restrictions: No Therapy Vitals Temp: 97.8 F (36.6 C) Pulse Rate: 79 Resp: 15 BP: 138/87 Patient Position (if appropriate): Sitting Oxygen Therapy SpO2: 94 % O2 Device: Room Air Pain: no c/o pain   ADL: ADL Eating: Not assessed Grooming: Moderate assistance Where Assessed-Grooming: Edge of bed Upper Body Bathing: Moderate assistance Where Assessed-Upper Body Bathing: Edge of bed Lower Body Bathing: Moderate assistance Where Assessed-Lower Body Bathing: Edge of bed,Other (Comment) (feet washed EOB, remainder of bathing completed sit<stand from [facility]) Upper Body Dressing: Maximal assistance Where Assessed-Upper Body Dressing: Other (Comment) (sitting on BSC) Lower Body Dressing: Maximal assistance Toileting: Moderate assistance Where Assessed-Toileting: Bedside Commode Toilet Transfer: Moderate assistance Toilet Transfer Method: Stand pivot (hemi walker) Toilet Transfer Equipment: Bedside commode Tub/Shower Transfer: Not assessed   Therapy/Group: Individual Therapy  [place] 05/17/2020, 4:08 PM

## 2020-05-17 NOTE — Progress Notes (Signed)
PROGRESS NOTE   Subjective/Complaints:  RIgh thsoulder pain, has this at home usually "lays on it" for relief, has seen 2 doctors for it  Per SLP- has some reduced awareness of food Left corner of mouth during meals   ROS- neg CP, SOB, N/V/D Objective:   No results found. No results for input(s): WBC, HGB, HCT, PLT in the last 72 hours. No results for input(s): NA, K, CL, CO2, GLUCOSE, BUN, CREATININE, CALCIUM in the last 72 hours.  Intake/Output Summary (Last 24 hours) at 05/17/2020 0818 Last data filed at 05/17/2020 0641 Gross per 24 hour  Intake 236 ml  Output 500 ml  Net -264 ml        Physical Exam: Vital Signs Blood pressure 127/71, pulse 70, temperature 98.1 F (36.7 C), resp. rate 18, weight 99.4 kg, SpO2 100 %.  General: No acute distress Mood and affect are appropriate Heart: Regular rate and rhythm no rubs murmurs or extra sounds Lungs: Clear to auscultation, breathing unlabored, no rales or wheezes Abdomen: Positive bowel sounds, soft nontender to palpation, nondistended Extremities: No clubbing, cyanosis, or edema Musculoskeletal:  General: Swellingand tendernesspresent.  Cervical back: Normal range of motion.  Comments: Negative impingement sign RIght shoulder , no pain to palpation no deformity  Skin: General: Skin is warmand dry.  Neurological:  Mental Status: He is alert.  Comments: Patient is alert. No acute distress. He does exhibit some left inattention. Speech is dysarthric but intelligible. Follows commands. Left central 7 and tongue deviation. Senses pain and light touch left face,arm, leg. DTR's 2+ LUE and LLE and 1+ on right. LUE: 1/5 delt, pec, biceps and 0/5 elsewhere. LLE: 3 to 3+/5 HF, KE and ADF/PF. Psychiatric:  Comments: Pleasant, pseudobulbar affect   Assessment/Plan: 1. Functional deficits which require 3+ hours per day of interdisciplinary  therapy in a comprehensive inpatient rehab setting.  Physiatrist is providing close team supervision and 24 hour management of active medical problems listed below.  Physiatrist and rehab team continue to assess barriers to discharge/monitor patient progress toward functional and medical goals  Care Tool:  Bathing    Body parts bathed by patient: Chest,Abdomen,Left arm,Face   Body parts bathed by helper: Right arm,Front perineal area,Buttocks     Bathing assist Assist Level: Moderate Assistance - Patient 50 - 74%     Upper Body Dressing/Undressing Upper body dressing   What is the patient wearing?: Pull over shirt    Upper body assist Assist Level: Maximal Assistance - Patient 25 - 49%    Lower Body Dressing/Undressing Lower body dressing      What is the patient wearing?: Incontinence brief,Pants     Lower body assist Assist for lower body dressing: Moderate Assistance - Patient 50 - 74%     Toileting Toileting    Toileting assist Assist for toileting: 2 Helpers (patient voided in brief nurse applied primo fit for the night)     Transfers Chair/bed transfer  Transfers assist     Chair/bed transfer assist level: Minimal Assistance - Patient > 75%     Locomotion Ambulation   Ambulation assist      Assist level: Minimal Assistance - Patient > 75% Assistive  device: Cane-quad Max distance: 80   Walk 10 feet activity   Assist     Assist level: Minimal Assistance - Patient > 75% Assistive device: Hand held assist   Walk 50 feet activity   Assist    Assist level: Minimal Assistance - Patient > 75%      Walk 150 feet activity   Assist Walk 150 feet activity did not occur: Safety/medical concerns         Walk 10 feet on uneven surface  activity   Assist     Assist level: Moderate Assistance - Patient - 50 - 74% Assistive device: Hand held assist   Wheelchair     Assist   Type of Wheelchair: Manual    Wheelchair assist  level: Minimal Assistance - Patient > 75% Max wheelchair distance: 150    Wheelchair 50 feet with 2 turns activity    Assist        Assist Level: Minimal Assistance - Patient > 75%   Wheelchair 150 feet activity     Assist      Assist Level: Minimal Assistance - Patient > 75%   Blood pressure 127/71, pulse 70, temperature 98.1 F (36.7 C), resp. rate 18, weight 99.4 kg, SpO2 100 %.  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  Impaired mobility:  -DVT/anticoagulation:SCDs -antiplatelet therapy: continue Aspirin and Plavix 75 mg daily x3 months then Plavix alone 3. Pain Management:Denies neuropathic pain: decrease Lyrica to 25 mg twice daily. Add lidocaine patch for right shoulder.  4. Mood:Provide emotional support -antipsychotic agents: N/A -add nuedexta for PBA. Discussed with pt/wife who would like to try it. 5. Neuropsych: This patientiscapable of making decisions on hisown behalf. 6. Skin/Wound Care:Routine skin checks 7. Fluids/Electrolytes/Nutrition:Routine in and outs with follow-up chemistrieson Monday 8. Dysphagia. Dysphagia #1 nectar liquids. Follow-up speech therapy 9. CKD stage IV. Admission BUN 2.26. Follow-up chemistries 10. Diabetes mellitus with peripheral neuropathy. Hemoglobin A1c 8.1.  -NovoLog 8 units 3 times daily -increase Lantus to 43 U -Check blood sugars before meals and at bedtime  4/17: CBGs 211-376: Cr elevated. Increase Lantus to 44U CBG (last 3)  Recent Labs    05/16/20 1728 05/16/20 2133 05/17/20 0610  GLUCAP 161* 202* 195*   increase lantus to 48U on 4/18  11. Hypertension. Well controlled, continue Norvasc 2.5 mg daily. Monitor with increased mobility on rehab Vitals:   05/16/20 1951 05/17/20 0608  BP: 137/72 127/71  Pulse: 76 70  Resp: 18 18  Temp: 97.7 F (36.5 C) 98.1 F (36.7 C)  SpO2: 97% 100%  controlled 4/18 12. Hyperlipidemia. Lipitor 13.Tobacco abuse.Counseling 14. Obesity BMI 32.36: provide counseling 15.  CHronic RIght shoulder pain- his family MD told him he had bursitis will check Xray-    LOS: 3 days A FACE TO FACE EVALUATION WAS PERFORMED  Charlett Blake 05/17/2020, 8:18 AM

## 2020-05-17 NOTE — Progress Notes (Signed)
Speech Language Pathology Daily Session Note  Patient Details  Name: Thomas Sampson MRN: [id number] Date of Birth: [birth year]  Today's Date: 05/17/2020 SLP Individual Time: 7505-1833 SLP Individual Time Calculation (min): 43 min  Short Term Goals: Week 1: SLP Short Term Goal 1 (Week 1): Pt will complete mildly complex problem solving tasks with min A verbal and visual cues. SLP Short Term Goal 2 (Week 1): Pt will recall novel/functional information with and without delay with min A cues for compensatory memory strategies SLP Short Term Goal 3 (Week 1): Pt will understand, recall and utilize strategies to increase speech intelligiblity at conversation level with min A verbal cues SLP Short Term Goal 4 (Week 1): Pt will consume trials ice chips and thin liquids with minimal overt s/s aspiration on 9/10 occurences. SLP Short Term Goal 5 (Week 1): Pt will consume trials Dys 2 solids with min A verbal cues to utilize compensatory strategies to increase bolus awareness  Skilled Therapeutic Interventions:Skilled ST services focused on swallow and cognitive skills. Pt demonstrated recall of swallow strategies with supervision A verbal cues. SLP facilitated PO intake of current diet of nectar thick liquids via straw and puree textures, breakfast tray, pt demonstrated ability to utilize swallow strategies with supervision A verbal cues for clear left anterior spillage only. Pt demonstrated use of lingual sweeps and liquid wash to clear mild left buccal pocketing with no overt s/s aspiration. SLP facilitated PO consumption of dys 2 texture snack, pt demonstrated appropriate oral clearance with supervision A verbal cues for oral residue with lingual sweeps and min A verbal cues for anterior spillage. SLP educated pt on use of mirrors during PO consumption to increase awareness of spillage, pt agreed to ask wife today to bring in mirror from home. Pt required mod A verbal cues for 90% intelligibly to slow rate  and over articulate. Pt was left in room with call bell within reach and bed alarm set. SLP recommends to continue skilled services.     Pain Pain Assessment Pain Score: 0-No pain  Therapy/Group: Individual Therapy  Terald Jump  [facility] 05/17/2020, 5:09 PM

## 2020-05-17 NOTE — IPOC Note (Signed)
Overall Plan of Care [facility]) Patient Details Name: Thomas Sampson MRN: [id number] DOB: 02-01-1945  Admitting Diagnosis: Right middle cerebral artery stroke [facility])  Hospital Problems: Principal Problem:   Right middle cerebral artery stroke [facility])     Functional Problem List: Nursing Bladder,Bowel,Endurance,Medication Management,Motor,Pain,Safety,Skin Integrity  PT Balance,Behavior,Edema,Motor,Endurance,Nutrition,Pain,Perception,Safety,Sensory,Skin Integrity  OT Balance,Safety,Perception,Cognition,Endurance,Motor,Pain  SLP Cognition,Safety  TR         Basic ADL's: OT Grooming,Bathing,Dressing,Toileting     Advanced  ADL's: OT Simple Meal Preparation     Transfers: PT Bed Mobility,Bed to [place]  OT Toilet,Tub/Shower     Locomotion: PT Ambulation,Wheelchair Mobility,Stairs     Additional Impairments: OT Fuctional Use of Upper Extremity  SLP Swallowing      TR      Anticipated Outcomes Item Anticipated Outcome  Self Feeding No goal  Swallowing  Supervision   Basic self-care  Supervision-Min A  Toileting  Min A   Bathroom Transfers Supervision  Bowel/Bladder  patient will be continent of bowel and bladder with mod assist  Transfers  supervisoin assist with LRAD  Locomotion  Supervision assist and LRAD  Communication  Supervision  Cognition  Supervision  Pain  pain will be less than or equal to 4/10 with min assist  Safety/Judgment  patient will be free from falls/injury and displaying apropriate safety judgemenmt with min assist   Therapy Plan: PT Intensity: Minimum of 1-2 x/day ,45 to 90 minutes PT Frequency: 5 out of 7 days PT Duration Estimated Length of Stay: 12-16 days OT Intensity: Minimum of 1-2 x/day, 45 to 90 minutes OT Frequency: 5 out of 7 days OT Duration/Estimated Length of Stay: 12-14 days SLP Intensity: Minumum of 1-2 x/day, 30 to 90 minutes SLP Frequency: 3 to 5 out of 7 days SLP Duration/Estimated Length of Stay:  12-14 days   Due to the current state of emergency, patients may not be receiving their 3-hours of Medicare-mandated therapy.   Team Interventions: Nursing Interventions Patient/Family Education,Bladder Management,Bowel Management,Disease Management/Prevention,Pain Management,Medication Management,Skin Care/Wound Management,Cognitive Remediation/Compensation,Dysphagia/Aspiration Health visitor  PT interventions Ambulation/gait training,Community Corporate treasurer re-education,Psychosocial support,Stair training,UE/LE Strength taining/ROM,Wheelchair propulsion/positioning,Balance/vestibular training,Discharge planning,Functional electrical stimulation,Pain management,Skin care/wound management,Therapeutic Activities,UE/LE Coordination activities,Cognitive remediation/compensation,Disease management/prevention,Patient/family education,Functional mobility training,Splinting/orthotics,Therapeutic Exercise,Visual/perceptual remediation/compensation  OT Interventions Balance/vestibular training,Discharge planning,Functional electrical stimulation,Pain management,Self Care/advanced ADL retraining,Therapeutic Activities,UE/LE Coordination activities,Visual/perceptual remediation/compensation,Therapeutic Exercise,Patient/family education,Functional mobility training,Disease mangement/prevention,Community reintegration,DME/adaptive equipment instruction,Neuromuscular re-education,Psychosocial support,UE/LE Strength taining/ROM,Wheelchair propulsion/positioning,Splinting/orthotics  SLP Interventions Cognitive remediation/compensation,Speech/Language facilitation,Cueing hierarchy,Functional tasks,Therapeutic Activities,Therapeutic Exercise,Internal/external aids,Dysphagia/aspiration precaution training,Medication managment,Patient/family education  TR Interventions    SW/CM Interventions     Barriers to Discharge MD  Medical stability  Nursing       PT Decreased caregiver support,Lack of/limited family support,Behavior    OT Other (comments) (n/a, has ramp to enter and will have 24/7 support from wife)    SLP      SW       Team Discharge Planning: Destination: PT-  ,OT- Home , SLP-Home Projected Follow-up: PT-Home health PT, OT-  24 hour supervision/assistance,Home health OT, SLP-Outpatient SLP Projected Equipment Needs: PT-Rolling walker with 5" wheels,To be determined, OT- To be determined, SLP-To be determined Equipment Details: PT- , OT-  Patient/family involved in discharge planning: PT- Patient,Family member/caregiver,  OT-Patient, SLP-Patient,Family member/caregiver  MD ELOS: 14-20d Medical Rehab Prognosis:  Good Assessment:  75 year old right-handed male with history of chronic kidney disease stage IV, diabetes mellitus, hypertension, hyperlipidemia and tobacco abuse. Per chart review patient lives with spouse independent prior to admission. 1 level home with ramped entrance. Presented 05/10/2020 to [facility]  [facility] with left-sided weakness and slurred speech of acute onset. 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 No significant carotid or vertebral artery stenosis of the neck. Admission chemistries unremarkable except glucose 207, BUN 31, creatinine 2.26 hemoglobin A1c 8.1. Echocardiogram with ejection fraction of 70 to 75% no wall motion abnormalities. 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 Lower extremity Dopplers negative for DVT. Initially maintained on Cleviprex for blood pressure control. Neurology follow-up currently maintained on aspirin 81 mg daily plus Plavix 75 mg daily for 3 months then Plavix alone. Awaiting loop recorder placement today..Pt on dysphagia #1 nectar  thick liquid diet. Therapy evaluations completed due to patient's left-sided weakness and slurred speech  Now requiring 24/7 Rehab RN,MD, as well as CIR level PT, OT and SLP.  Treatment team will focus on ADLs and mobility with goals set at supervision   See Team Conference Notes for weekly updates to the plan of care

## 2020-05-17 NOTE — Plan of Care (Signed)
  Problem: Consults Goal: RH STROKE PATIENT EDUCATION Description: See Patient Education module for education specifics  Outcome: Progressing Goal: Nutrition Consult-if indicated Outcome: Progressing Goal: Diabetes Guidelines if Diabetic/Glucose > 140 Description: If diabetic or lab glucose is > 140 mg/dl - Initiate Diabetes/Hyperglycemia Guidelines & Document Interventions  Outcome: Progressing   Problem: RH BOWEL ELIMINATION Goal: RH STG MANAGE BOWEL WITH ASSISTANCE Description: STG Manage Bowel with min Assistance. Outcome: Progressing Goal: RH STG MANAGE BOWEL W/MEDICATION W/ASSISTANCE Description: STG Manage Bowel with Medication with min Assistance. Outcome: Progressing   Problem: RH SKIN INTEGRITY Goal: RH STG SKIN FREE OF INFECTION/BREAKDOWN Outcome: Progressing Goal: RH STG MAINTAIN SKIN INTEGRITY WITH ASSISTANCE Description: STG Maintain Skin Integrity With min Assistance. Outcome: Progressing Goal: RH STG ABLE TO PERFORM INCISION/WOUND CARE W/ASSISTANCE Description: STG Able To Perform Incision/Wound Care With min Assistance. Outcome: Progressing   Problem: RH SAFETY Goal: RH STG ADHERE TO SAFETY PRECAUTIONS W/ASSISTANCE/DEVICE Description: STG Adhere to Safety Precautions With min Assistance/Device. Outcome: Progressing Goal: RH STG DECREASED RISK OF FALL WITH ASSISTANCE Description: STG Decreased Risk of Fall With min Assistance. Outcome: Progressing   Problem: RH COGNITION-NURSING Goal: RH STG USES MEMORY AIDS/STRATEGIES W/ASSIST TO PROBLEM SOLVE Description: STG Uses Memory Aids/Strategies With min Assistance to Problem Solve. Outcome: Progressing Goal: RH STG ANTICIPATES NEEDS/CALLS FOR ASSIST W/ASSIST/CUES Description: STG Anticipates Needs/Calls for Assist With min Assistance/Cues. Outcome: Progressing   Problem: RH PAIN MANAGEMENT Goal: RH STG PAIN MANAGED AT OR BELOW PT'S PAIN GOAL Description: <3 out of 10 on pain scale Outcome: Progressing    Problem: RH KNOWLEDGE DEFICIT Goal: RH STG INCREASE KNOWLEDGE OF DIABETES Description: Patient will be able to manage DM with medications and dietary modifications using handouts and educational tools with min assist  Outcome: Progressing Goal: RH STG INCREASE KNOWLEDGE OF HYPERTENSION Description: Patient will be able to manage HTN with medications and dietary modifications using handouts and educational tools with min assist  Outcome: Progressing Goal: RH STG INCREASE KNOWLEDGE OF DYSPHAGIA/FLUID INTAKE Description: Patient will be able to manage Dysphagia: nutrition and dietary modifications using handouts and educational tools with min assist  Outcome: Progressing Goal: RH STG INCREASE KNOWLEGDE OF HYPERLIPIDEMIA Description: Patient will be able to manage HLD with medications and dietary modifications using handouts and educational tools with min assist  Outcome: Progressing Goal: RH STG INCREASE KNOWLEDGE OF STROKE PROPHYLAXIS Description: Patient will be able to manage secondary stroke risks with medications and dietary modifications using handouts and educational tools with min assist Outcome: Progressing   Problem: RH BLADDER ELIMINATION Goal: RH STG MANAGE BLADDER WITH ASSISTANCE Description: STG Manage Bladder With min Assistance Outcome: Not Progressing Goal: RH STG MANAGE BLADDER WITH EQUIPMENT WITH ASSISTANCE Description: STG Manage Bladder With Equipment With min Assistance Outcome: Not Progressing

## 2020-05-18 DIAGNOSIS — I63511 Cerebral infarction due to unspecified occlusion or stenosis of right middle cerebral artery: Secondary | ICD-10-CM | POA: Diagnosis not present

## 2020-05-18 LAB — GLUCOSE, CAPILLARY
Glucose-Capillary: 135 mg/dL — ABNORMAL HIGH (ref 70–99)
Glucose-Capillary: 239 mg/dL — ABNORMAL HIGH (ref 70–99)
Glucose-Capillary: 158 mg/dL — ABNORMAL HIGH (ref 70–99)
Glucose-Capillary: 201 mg/dL — ABNORMAL HIGH (ref 70–99)

## 2020-05-18 MED ORDER — DICLOFENAC SODIUM 1 % EX GEL
2.0000 g | Freq: Four times a day (QID) | CUTANEOUS | Status: DC
  Administered 2020-05-18 – 2020-05-28 (×37): 2 g via TOPICAL
  Filled 2020-05-18 (×2): qty 100

## 2020-05-18 MED ORDER — BLOOD PRESSURE CONTROL BOOK
Freq: Once | Status: DC
  Filled 2020-05-18: qty 1

## 2020-05-18 MED ORDER — LIVING WELL WITH DIABETES BOOK
Freq: Once | Status: AC
  Filled 2020-05-18: qty 1

## 2020-05-18 NOTE — Progress Notes (Signed)
Patient ID: Thomas Sampson, male   DOB: 1945/03/24, 75 y.o.   MRN: [id number] Met with the patient, wife and brother to introduce self and role of the nurse CM. Discussed secondary stroke risks including T2 Dm (A1c 8.5) and HTN, HLD (LDL 91, Trig 208) and CKD. Reviewed DAPT x 3 months then Plavix solo per MD and Nuedexta for pseudobulbar affect. Reviewed medications with patient and wife and insulin pen use. Also reviewed dysphagia diet and dietary modifications; DASH diet, diet recommendations for high triglycerides and carbohydrate counting. Patient's wife unable to access My Chart; assistance obtaining log in/password given. Continue to follow along to discharge to address educational needs and collaborate with the SW to facilitate preparation for discharge. [name]

## 2020-05-18 NOTE — Progress Notes (Signed)
Speech Language Pathology Daily Session Note  Patient Details  Name: Thomas Sampson MRN: [id number] Date of Birth: 28-Apr-1945  Today's Date: 05/18/2020 SLP Individual Time: 0730-0800 SLP Individual Time Calculation (min): 30 min  Short Term Goals: Week 1: SLP Short Term Goal 1 (Week 1): Pt will complete mildly complex problem solving tasks with min A verbal and visual cues. SLP Short Term Goal 2 (Week 1): Pt will recall novel/functional information with and without delay with min A cues for compensatory memory strategies SLP Short Term Goal 3 (Week 1): Pt will understand, recall and utilize strategies to increase speech intelligiblity at conversation level with min A verbal cues SLP Short Term Goal 4 (Week 1): Pt will consume trials ice chips and thin liquids with minimal overt s/s aspiration on 9/10 occurences. SLP Short Term Goal 5 (Week 1): Pt will consume trials Dys 2 solids with min A verbal cues to utilize compensatory strategies to increase bolus awareness  Skilled Therapeutic Interventions: Skilled SLP intervention focused on dysphagia. Pt seen with pureed breakfast tray and NTL. Anterior spillage from left side of mouth but patient demonstrated adequate sensation and awareness of food on side of mouth and was able to clear adequately. Trials completed with dys 2 textures. He demonstrated adequate mastication and oral clearance. He tolerated with no overt s/sx of aspiration or penetration. Pt demonstrated use of safe swallow precautions with intermittent Supervision. Cont with therapy per plan of care.      Pain Pain Assessment Pain Scale: Faces Pain Score: 0-No pain Faces Pain Scale: No hurt  Therapy/Group: Individual Therapy  Darrol Poke Ramata Strothman 05/18/2020, 7:57 AM

## 2020-05-18 NOTE — Progress Notes (Signed)
PROGRESS NOTE   Subjective/Complaints:  Discussed fluid intake and shoulder xray result  ROS- neg CP, SOB, N/V/D Objective:   DG Shoulder Right  Result Date: 05/17/2020 CLINICAL DATA:  Right shoulder pain. EXAM: RIGHT SHOULDER - 2+ VIEW COMPARISON:  None. FINDINGS: There is no evidence of fracture or dislocation. Moderate degenerative changes seen involving the right acromioclavicular joint. Soft tissues are unremarkable. 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 No acute abnormality seen in the right shoulder. Electronically Signed   By: Marijo Conception M.D.   On: 05/17/2020 09:30   Recent Labs    05/17/20 0753  WBC 11.5*  HGB 13.2  HCT 40.2  PLT 226   Recent Labs    05/17/20 0753  NA 141  K 3.9  CL 107  CO2 27  GLUCOSE 231*  BUN 28*  CREATININE 1.97*  CALCIUM 8.6*    Intake/Output Summary (Last 24 hours) at 05/18/2020 0735 Last data filed at 05/18/2020 0556 Gross per 24 hour  Intake 1000 ml  Output 800 ml  Net 200 ml        Physical Exam: Vital Signs Blood pressure (!) 143/68, pulse 69, temperature 98.2 F (36.8 C), temperature source Oral, resp. rate 18, weight 99.4 kg, SpO2 94 %.  General: No acute distress Mood and affect are appropriate Heart: Regular rate and rhythm no rubs murmurs or extra sounds Lungs: Clear to auscultation, breathing unlabored, no rales or wheezes Abdomen: Positive bowel sounds, soft nontender to palpation, nondistended Extremities: No clubbing, cyanosis, or edema Skin: No evidence of breakdown, no evidence of rash Musculoskeletal:  General: Swellingand tendernesspresent.  Cervical back: Normal range of motion.  Comments: Negative impingement sign RIght shoulder , no pain to palpation no deformity  Skin: General: Skin is warmand dry.  Neurological:  Mental Status: He is alert.  Comments: Patient is alert. No acute  distress. He does exhibit some left inattention. Speech is dysarthric but intelligible. Follows commands. Left central 7 and tongue deviation. Senses pain and light touch left face,arm, leg. DTR's 2+ LUE and LLE and 1+ on right. LUE: 1/5 delt, pec, biceps and 0/5 elsewhere. LLE: 3 to 3+/5 HF, KE and ADF/PF. Psychiatric:  Comments: Pleasant, pseudobulbar affect   Assessment/Plan: 1. Functional deficits which require 3+ hours per day of interdisciplinary therapy in a comprehensive inpatient rehab setting.  Physiatrist is providing close team supervision and 24 hour management of active medical problems listed below.  Physiatrist and rehab team continue to assess barriers to discharge/monitor patient progress toward functional and medical goals  Care Tool:  Bathing    Body parts bathed by patient: (P) Chest,Abdomen,Left arm,Face   Body parts bathed by helper: (P) Right arm     Bathing assist Assist Level: Moderate Assistance - Patient 50 - 74%     Upper Body Dressing/Undressing Upper body dressing   What is the patient wearing?: (P) Pull over shirt    Upper body assist Assist Level: (P) Moderate Assistance - Patient 50 - 74%    Lower Body Dressing/Undressing Lower body dressing      What is the patient wearing?: Incontinence brief,Pants     Lower body assist Assist for  lower body dressing: Moderate Assistance - Patient 50 - 74%     Toileting Toileting    Toileting assist Assist for toileting: 2 Helpers (patient voided in brief nurse applied primo fit for the night)     Transfers Chair/bed transfer  Transfers assist     Chair/bed transfer assist level: Contact Guard/Touching assist     Locomotion Ambulation   Ambulation assist      Assist level: Contact Guard/Touching assist Assistive device: Walker-hemi Max distance: 210 feet   Walk 10 feet activity   Assist     Assist level: Contact Guard/Touching assist Assistive device: No Device    Walk 50 feet activity   Assist    Assist level: Contact Guard/Touching assist Assistive device: Walker-hemi    Walk 150 feet activity   Assist Walk 150 feet activity did not occur: Safety/medical concerns  Assist level: Contact Guard/Touching assist Assistive device: Walker-hemi    Walk 10 feet on uneven surface  activity   Assist     Assist level: Minimal Assistance - Patient > 75% Assistive device: Tree surgeon Will patient use wheelchair at discharge?: Yes Type of Wheelchair: Manual    Wheelchair assist level: Minimal Assistance - Patient > 75% Max wheelchair distance: 150    Wheelchair 50 feet with 2 turns activity    Assist        Assist Level: Minimal Assistance - Patient > 75%   Wheelchair 150 feet activity     Assist      Assist Level: Minimal Assistance - Patient > 75%   Blood pressure (!) 143/68, pulse 69, temperature 98.2 F (36.8 C), temperature source Oral, resp. rate 18, weight 99.4 kg, SpO2 94 %.  Medical Problem List and Plan: 1.Left hemiparesis/dysarthria/dysphagia and functional deficitssecondary to acute moderate size right MCA infarction due to occlusion of the right M3 branch superior division status post thrombectomy -patient may shower -ELOS/Goals: 14-20 days, supervision PT, sup/min OT, mod I SLP  -Continue CIR- team conf in am  2.  Impaired mobility:  -DVT/anticoagulation:SCDs -antiplatelet therapy: continue Aspirin and Plavix 75 mg daily x3 months then Plavix alone 3. Pain Management:Denies neuropathic pain: decrease Lyrica to 25 mg twice daily. Add lidocaine patch for right shoulder.  4. Mood:Provide emotional support -antipsychotic agents: N/A -add nuedexta for PBA. Discussed with pt/wife who would like to try it. 5. Neuropsych: This patientiscapable of making decisions on hisown behalf. 6. Skin/Wound Care:Routine  skin checks 7. Fluids/Electrolytes/Nutrition:Routine in and outs with follow-up may d/c IV 8. Dysphagia. Dysphagia #1 nectar liquids. Follow-up speech therapy 9. CKD stage IV. Admission BUN 2.26. Follow-up chemistries- at baseline 10. Diabetes mellitus with peripheral neuropathy. Hemoglobin A1c 8.1.  -NovoLog 8 units 3 times daily -increase Lantus to 43 U -Check blood sugars before meals and at bedtime  4/17: CBGs 211-376: Cr elevated. Increase Lantus to 44U CBG (last 3)  Recent Labs    05/17/20 1626 05/17/20 2103 05/18/20 0540  GLUCAP 194* 113* 135*  increase lantus to 48U on 4/18- improved CBG this am   11. Hypertension. Well controlled, continue Norvasc 2.5 mg daily. Monitor with increased mobility on rehab Vitals:   05/17/20 1936 05/18/20 0542  BP: (!) 169/79 (!) 143/68  Pulse: 79 69  Resp: 20 18  Temp: 98.4 F (36.9 C) 98.2 F (36.8 C)  SpO2: 96% 94%  controlled 4/19 12. Hyperlipidemia. Lipitor 13.Tobacco abuse.Counseling 14. Obesity BMI 32.36: provide counseling 15.  CHronic RIght shoulder pain- his family MD  told him he had bursitis will check Xray-    LOS: 4 days A FACE TO FACE EVALUATION WAS PERFORMED  Thomas Sampson 05/18/2020, 7:35 AM

## 2020-05-18 NOTE — Progress Notes (Signed)
Occupational Therapy Session Note  Patient Details  Name: Thomas Sampson MRN: [id number] Date of Birth: 08-14-1945  Today's Date: 05/18/2020 OT Individual Time: 3507-5732 OT Individual Time Calculation (min): 56 min    Short Term Goals: Week 1:  OT Short Term Goal 1 (Week 1): Pt complete LB dressing with Mod A using compensatory strategies as needed OT Short Term Goal 2 (Week 1): Pt will complete UB dressing with Mod A and hemi techniques OT Short Term Goal 3 (Week 1): Pt will complete toilet transfer with Min A and LRAD  Skilled Therapeutic Interventions/Progress Updates:    Pt received semi-reclined in bed, denies pain, agreeable to therapy. Came to sitting EOB with min A to lift trunk and use of bed features, STS and amb to bathroom with hemi-walker and CGA. Transferred to TTB CGA. Bathed full-body with min A to bathe RUE, STS with grab bar and close S. Doffed shirt close S, donned shirt with min A to thread LUE. Doffed brief CGA in standing, donned new brief with min A to thread BLE, doffed/donned shorts CGA for STS. Doffed/donned TEDS and B gripper socks with total A 2/2 time. Completed oral care with set-up A seated at sink. Completed 1x20 modified push ups in standing with A to facilitate LUE WB, additionally completed 1x20 B shoulder elevation/protraction.    Pt left seated in w/c with chair alarm engaged, family and RN present, call bell in reach, and all immediate needs met.    Therapy Documentation Precautions:  Precautions Precautions: Fall Precaution Comments: Lt inattention, Lt hemiparesis UE>LE Restrictions Weight Bearing Restrictions: No Pain: denies   ADL: See Care Tool for more details. Therapy/Group: Individual Therapy  Volanda Napoleon MS, OTR/L  05/18/2020, 12:11 PM

## 2020-05-18 NOTE — Progress Notes (Signed)
Inpatient [facility] Individual Statement of Services  Patient Name:  Thomas Sampson  Date:  05/18/2020  Welcome to the [facility].  Our goal is to provide you with an individualized program based on your diagnosis and situation, designed to meet your specific needs.  With this comprehensive rehabilitation program, you will be expected to participate in at least 3 hours of rehabilitation therapies Monday-Friday, with modified therapy programming on the weekends.  Your rehabilitation program will include the following services:  Physical Therapy (PT), Occupational Therapy (OT), Speech Therapy (ST), 24 hour per day rehabilitation nursing, Therapeutic Recreaction (TR), Neuropsychology, Care Coordinator, Rehabilitation Medicine, Nutrition Services, Pharmacy Services and Other  Weekly team conferences will be held on Wednesdays to discuss your progress.  Your Inpatient Rehabilitation Care Coordinator will talk with you frequently to get your input and to update you on team discussions.  Team conferences with you and your family in attendance may also be held.  Expected length of stay: 14-20 Days  Overall anticipated outcome: Supervision to Min A   Depending on your progress and recovery, your program may change. Your Inpatient Rehabilitation Care Coordinator will coordinate services and will keep you informed of any changes. Your Inpatient Rehabilitation Care Coordinator's name and contact numbers are listed  below.  The following services may also be recommended but are not provided by the [facility]:    [place] will be made to provide these services after discharge if needed.  Arrangements include referral to agencies that provide these services.  Your insurance has been verified to be:  Medicare Your primary doctor is:  Harrel Lemon , MD  Pertinent  information will be shared with your doctor and your insurance company.  Inpatient Rehabilitation Care Coordinator:  Erlene Quan, [place] or [phone]  Information discussed with and copy given to patient by: Dyanne Iha, 05/18/2020, 9:59 AM

## 2020-05-18 NOTE — Progress Notes (Signed)
Inpatient Rehabilitation Care Coordinator Assessment and Plan Patient Details  Name: Thomas Sampson MRN: [id number] Date of Birth: 05-Feb-1945  Today's Date: 05/18/2020  Hospital Problems: Principal Problem:   Right middle cerebral artery stroke [facility])  Past Medical History:  Past Medical History:  Diagnosis Date  . Chronic kidney disease    kidney stones  . Diabetes mellitus without complication ([place])   . Hyperlipidemia   . Hypertension   . Tinea pedis    Past Surgical History:  Past Surgical History:  Procedure Laterality Date  . COLON SURGERY    . COLONOSCOPY WITH PROPOFOL N/A 09/18/2016   Procedure: COLONOSCOPY WITH PROPOFOL;  Surgeon: Lollie Sails, MD;  Location: [facility] ENDOSCOPY;  Service: Endoscopy;  Laterality: N/A;  . EYE SURGERY     cataract extraction  . IR CT HEAD LTD  05/10/2020  . IR PERCUTANEOUS ART THROMBECTOMY/INFUSION INTRACRANIAL INC DIAG ANGIO  05/10/2020  . LOOP RECORDER INSERTION N/A 05/14/2020   Procedure: LOOP RECORDER INSERTION;  Surgeon: Constance Haw, MD;  Location: [facility] CV LAB;  Service: Cardiovascular;  Laterality: N/A;  . RADIOLOGY WITH ANESTHESIA N/A 05/10/2020   Procedure: IR WITH ANESTHESIA;  Surgeon: Radiologist, Medication, MD;  Location: [facility];  Service: Radiology;  Laterality: N/A;   Social History:  reports that he has quit smoking. His smoking use included cigarettes. He quit after 40.00 years of use. He has never used smokeless tobacco. He reports that he does not drink alcohol and does not use drugs.  Family / Support Systems Marital Status: Married Patient Roles: Spouse Spouse/Significant Other: Paediatric nurse Children: Rex (Son) Anticipated Caregiver: Spouse Ability/Limitations of Caregiver: Spouse retired and can provide supervision LOC Caregiver Availability: 24/7  Social History Preferred language: English Religion: None Read: Yes Write: Yes Employment Status: Retired Date Retired/Disabled/Unemployed: Animal nutritionist Issues: n/a Guardian/Conservator: n/a   Abuse/Neglect Abuse/Neglect Assessment Can Be Completed: Yes Physical Abuse: Denies Verbal Abuse: Denies Sexual Abuse: Denies Exploitation of patient/patient's resources: Denies Self-Neglect: Denies  Emotional Status Pt's affect, behavior and adjustment status: n/a Recent Psychosocial Issues: coping Psychiatric History: no Substance Abuse History: no  Patient / Family Perceptions, Expectations & Goals Pt/Family understanding of illness & functional limitations: yes per spouse Premorbid pt/family roles/activities: Previously Independent, active in comm daily, driving Anticipated changes in roles/activities/participation: Spouse will assit ot in home with taskm Pt/family expectations/goals: Supervison to [employer]: None Premorbid Home Care/DME Agencies: Other (Comment) (Built in Civil engineer, contracting, Conservation officer, nature) Transportation available at discharge: Family able to [employer] referrals recommended: Neuropsychology (coping)  Discharge Planning Living Arrangements: Spouse/significant other Support Systems: Spouse/significant other,Children Type of Residence: Private residence (1 level home. Ramp entrance) Insurance Resources: Magazine features editor (specify) Civil Service fast streamer) Financial Resources: [employer] Financial Screen Referred: No Living Expenses: Own Money Management: Patient Does the patient have any problems obtaining your medications?: No Home Management: Independent Patient/Family Preliminary Plans: Spouse will assit with money and med mangement Care Coordinator Anticipated Follow Up Needs: HH/OP Expected length of stay: 14-20 Days  Clinical Impression SW met with pt introduced self, explained role and addressed questions and concerns. SW called spouse provided same information. Spouse reports she is typically here in the mornings and she will be present  on this evening to visit pt. Spouse did confirm she plans for pt to d/c home for her to provide care 24/7. Spouse is able to provide mainly SUPERVISION LOC, with some light MIN A. Pt has Med A and is eligible  for SNF pending progress. SW will cont to follow up with questions and concerns.   Dyanne Iha 05/18/2020, 10:05 AM

## 2020-05-19 DIAGNOSIS — I63511 Cerebral infarction due to unspecified occlusion or stenosis of right middle cerebral artery: Secondary | ICD-10-CM | POA: Diagnosis not present

## 2020-05-19 LAB — GLUCOSE, CAPILLARY
Glucose-Capillary: 164 mg/dL — ABNORMAL HIGH (ref 70–99)
Glucose-Capillary: 256 mg/dL — ABNORMAL HIGH (ref 70–99)
Glucose-Capillary: 199 mg/dL — ABNORMAL HIGH (ref 70–99)
Glucose-Capillary: 199 mg/dL — ABNORMAL HIGH (ref 70–99)

## 2020-05-19 MED ORDER — INSULIN GLARGINE 100 UNIT/ML ~~LOC~~ SOLN
50.0000 [IU] | Freq: Every day | SUBCUTANEOUS | Status: DC
  Administered 2020-05-20 – 2020-05-28 (×9): 50 [IU] via SUBCUTANEOUS
  Filled 2020-05-19 (×9): qty 0.5

## 2020-05-19 NOTE — Progress Notes (Signed)
Physical Therapy Session Note  Patient Details  Name: Thomas Sampson MRN: [id number] Date of Birth: 1945/09/14  Today's Date: 05/19/2020 PT Individual Time: 1105-1200 PT Individual Time Calculation (min): 55 min   Short Term Goals: Week 1:  PT Short Term Goal 1 (Week 1): Pt will perform bed mobility with supervisoin assist PT Short Term Goal 2 (Week 1): Pt will ambulate 117f with min assist PT Short Term Goal 3 (Week 1): Pt [facility] PT Short Term Goal 4 (Week 1): Pt will propell WC [id number] supervision assist    Skilled Therapeutic Interventions/Progress Updates:   Pt received supine in bed and agreeable to PT. Supine>sit transfer with supervision assist and heavy use of bed rail. Ambulatory transfer to toilet with CGA for control of the LUE. Pt able to void bowel sitting in toilet and performed clothing management with supervision assist. PT assisted with pericare witth mod assist to ensure adequate cleaning. Hand and oral hygiene at sink with set up assist from PT and cues for midline orientation and visual scanning to locate all needs on sink.   Pt transported to entrance of [facility] training with QC over unlevel cement sidewalk with supervision assist and Givmorh support for the LUE. Performed x 12083fnd 75 with cues for visual scanning to locate obstacles on the L.   Pt transported to day [facility] supervision [facility] and  QC. BLE activity tolerance training on nustep, level 5 x 6 minutes. Pt rated Borg RPE 12/20.   Patient returned to room and performed stand pivot to recliner with QC and supervision assist. Pt left sitting in recliner with call bell in reach and all needs met.         Therapy Documentation Precautions:  Precautions Precautions: Fall Precaution Comments: Lt inattention, Lt hemiparesis UE>LE Restrictions Weight Bearing Restrictions: No    Pain: Pain Assessment Pain Scale: Faces Faces  Pain Scale: No hurt   Therapy/Group: Individual Therapy  AusLorie Phenix20/2022, 12:20 PM

## 2020-05-19 NOTE — Progress Notes (Signed)
Occupational Therapy Session Note  Patient Details  Name: Thomas Sampson MRN: [id number] Date of Birth: [birth year]  Today's Date: 05/19/2020 OT Individual Time: 0701-0802 OT Individual Time Calculation (min): 61 min    Short Term Goals: Week 1:  OT Short Term Goal 1 (Week 1): Pt complete LB dressing with Mod A using compensatory strategies as needed OT Short Term Goal 2 (Week 1): Pt will complete UB dressing with Mod A and hemi techniques OT Short Term Goal 3 (Week 1): Pt will complete toilet transfer with Min A and LRAD  Skilled Therapeutic Interventions/Progress Updates:    Pt received seated EOB, decling ADL and pain, agreeable to therapy. Session focus on LUE NMR and WB in prep for improved functional use of LUE/bimanual ADL. STS and amb to w/c with CGA and use of small-based quad cane. W/C transport to and from gym 2/2 time management. Completed 1x20 of modified push-ups, forearm WB, towel slides in B shoulder flexion, and pushes against modified incline. Applied K-tape to L shoulder to help approximate inferior subluxation. Pt reports no skin irritation, will cont to monitor.  90 degrees of active elbow flexion, ~15 degrees active shoulder flexion, minimal L thumb extension noted this date.  Doffed shirt close S, donned new shirt min A to thread LUE.   Pt left seated EOB with bed alarm engaged, call bell in reach, and all immediate needs met.    Therapy Documentation Precautions:  Precautions Precautions: Fall Precaution Comments: Lt inattention, Lt hemiparesis UE>LE Restrictions Weight Bearing Restrictions: No Pain:   denies ADL: See Care Tool for more details.   Therapy/Group: Individual Therapy  Volanda Napoleon MS, OTR/L  05/19/2020, 6:39 AM

## 2020-05-19 NOTE — Progress Notes (Signed)
Speech Language Pathology Daily Session Note  Patient Details  Name: KENYADA HY MRN: [id number] Date of Birth: [birth year]  Today's Date: 05/19/2020 SLP Individual Time: 0815-0900 SLP Individual Time Calculation (min): 45 min  Short Term Goals: Week 1: SLP Short Term Goal 1 (Week 1): Pt will complete mildly complex problem solving tasks with min A verbal and visual cues. SLP Short Term Goal 2 (Week 1): Pt will recall novel/functional information with and without delay with min A cues for compensatory memory strategies SLP Short Term Goal 3 (Week 1): Pt will understand, recall and utilize strategies to increase speech intelligiblity at conversation level with min A verbal cues SLP Short Term Goal 4 (Week 1): Pt will consume trials ice chips and thin liquids with minimal overt s/s aspiration on 9/10 occurences. SLP Short Term Goal 5 (Week 1): Pt will consume trials Dys 2 solids with min A verbal cues to utilize compensatory strategies to increase bolus awareness  Skilled Therapeutic Interventions: Skilled SLP intervention focused on dysphagia and cognition. Pt seen with trials of dysphagia 2 consistency, ice chips, and thin liquids. Adequate mastication and oral clearance with dysphagia 2 snack. Pt used mirror to check for food on left side of mouth and used lingual sweep with Supervision A to check for residue in buccal cavity. Coughing noted x2 before swallow with dys 2 snack trials. Pt tolerated small sips of thin liquids by cup sip and straw and ice chips with no overt s/sx of aspiration or penetration. Voice was clear after all trials with ice chips and thin liquids. Pt answered problem solving questions regarding times for therapy and purpose for each rehab with min A verbal cues.      Pain Pain Assessment Pain Scale: Faces Faces Pain Scale: No hurt  Therapy/Group: Individual Therapy  Darrol Poke Johonna Binette 05/19/2020, 8:38 AM

## 2020-05-19 NOTE — Progress Notes (Addendum)
Patient ID: Thomas Sampson, male   DOB: 1945-12-25, 76 y.o.   MRN: [id number] Team Conference Report to Patient/Family  Team Conference discussion was reviewed with the patient and caregiver, including goals, any changes in plan of care and target discharge date.  Patient and caregiver express understanding and are in agreement.  The patient has a target discharge date of 05/28/20.  SW met with pt and spouse in room. Provided conference updates. Pt and and spouse happy about pt progress. SW will cont to follow up.  Dyanne Iha 05/19/2020, 1:42 PM

## 2020-05-19 NOTE — Progress Notes (Signed)
Physical Therapy Session Note  Patient Details  Name: Thomas Sampson MRN: [id number] Date of Birth: 22-Feb-1945  Today's Date: 05/18/2020 PT Individual Time: 0800-0915  PT Individual Time Calculation (min): 75 min   Short Term Goals: Week 1:  PT Short Term Goal 1 (Week 1): Pt will perform bed mobility with supervisoin assist PT Short Term Goal 2 (Week 1): Pt will ambulate 130ft with min assist PT Short Term Goal 3 (Week 1): Pt will transfer to and from [facility] with CGA PT Short Term Goal 4 (Week 1): Pt will propell WC 160ft wtih supervision assist  Skilled Therapeutic Interventions/Progress Updates:  Patient seated EOB and completing breakfast with SLP on entrance to room. Patient alert and agreeable to PT session. Patient denied pain during session.   Therapeutic Activity: Bed Mobility: After completing breakfast, assisted pt with change of shirt requiring Mod A and vc for sequence and technique to complete. Then Giv-Mohr sling donned prior to OOB activity.   Patient performed sit--> supine with supervision at end of session. With vc he is able to improve positioning in bed toward HOB. Despite vc for LLE use to assist, pt unaware that LLE has returned to extension on bed surface and pt only uses RLE to push toward [facility] with Min A from therapist.   Transfers: Patient performed STS and SPVT transfers throughout session with supervision/ intermittent CGA for balance. Able to perform to [facility] as well as to no AD with good push-to-stand and reach back for seat prior to descent to sit each transfer. Focus on midline orientation with equal LE push to stand. Provided verbal cues for balance and midline orientation  Gait Training:  Patient ambulated 14' x1/ 115' x1/ 67' x1 using hemiwalker for first bout and progressing to use of SBQC for remaining bouts. Completes all bouts with CGA. Provided verbal cues for AD placement, step length, RLE step through gait pattern.  Neuromuscular Re-ed: NMR facilitated  during session with focus on standing balance. Pt guided in static standing balance on various surfaces including Airex and ramped pliant surface with use of mirror for improved pt proprioception. Pt demos improved self correction with use of mirror initially and then requires minimal vc NMR performed for improvements in motor control and coordination, balance, sequencing, judgement, and self confidence/ efficacy in performing all aspects of mobility at highest level of independence.   Patient supine in bed at end of session with brakes locked, bed alarm set, and all needs within reach.     Therapy Documentation Precautions:  Precautions Precautions: Fall Precaution Comments: Lt inattention, Lt hemiparesis UE>LE Restrictions Weight Bearing Restrictions: No   Therapy/Group: Individual Therapy  Alger Simons 05/18/2020, 5:26 PM

## 2020-05-19 NOTE — Patient Care Conference (Signed)
Inpatient RehabilitationTeam Conference and Plan of Care Update Date: 05/19/2020   Time: 10:26 AM    Patient Name: Thomas Sampson      Medical Record Number: [id number]  Date of Birth: 26-Jan-1946 Sex: Male         Room/Bed: 5C08C/5C08C-01 Payor Info: Payor: MEDICARE / Plan: MEDICARE PART A AND B / Product Type: *No Product type* /    Admit Date/Time:  05/14/2020  4:07 PM  Primary Diagnosis:  Right middle cerebral artery stroke [facility])  Hospital Problems: Principal Problem:   Right middle cerebral artery stroke [facility])    Expected Discharge Date: Expected Discharge Date: 05/28/20  Team Members Present: Physician leading conference: Dr. Alysia Penna Care Coodinator Present: Dorien Chihuahua, RN, BSN, CRRN;Christina Sampson Goon, [place] Nurse Present: Dorien Chihuahua, RN PT Present: Other (comment) Judieth Keens, PT) OT Present: Other (comment) Providence Lanius, OT) SLP Present: Other (comment) Gunnar Fusi, SLP) PPS Coordinator present : Gunnar Fusi, SLP     Current Status/Progress Goal Weekly Team Focus  Bowel/Bladder   Currently cont of B/B. LBM 05/17/20  Pt remains cont of B/B  Offer toliet assistance q2h and PRN   Swallow/Nutrition/ Hydration   dys1 with NTL  Supervision with Dys 3  trials with dys 2, ice chip trials.   ADL's   CGA toilet/TTB transfers with hemi-walker, min A LBD, min A UBB/UBD, S for seated grooming/self-feeding, Brunstrum level II LUE, I hand, using givmohr sling for func mobility  S to min A overall, may upgrade  LUE NMR, activity tolerance, balance/functional transfer/self-care retrianing, pt/family/DME/AE education   Mobility   Bed mobility = Supervision/ CGA; transfers = CGA; Gait = CGA/ intermittent Min A for balance with decreasing stability of AD (yesterday used SBQC)  Overall Supervision/ CGA for all functional mobility  Standing balance training, improving quality of gait and transfers, increasing activity tolerance, BLE strengthening    Communication             Safety/Cognition/ Behavioral Observations  Mod A  Supervision A  mildly problem solving  functional recall,   Pain   Pain at 0 out of 10  Pt remains pain free  Assess for pain Qshift and offer PRN medication or Heat/cold pain relief methods.   Skin   Incision on chest where Loop recorder was placed. Incision is intact with no S/S of infection  Skin remain intact with to s/s of breakdown or infection.  Skin care qshift and PRN     Discharge Planning:  D/c home with spouse to provide care. Spouse able to provide 24/7 . Supervision LOC. 1 level home, ramp entrance. Pt has built in shower seat and rw   Team Discussion: Left hemiparesis post Right MCA CVA. Pseudobulbar affect improved with medication. MD adjusting meds for BP and DM. Balance is good with cane/hemiwalker however over time; with fatigue, note left lean.  Patient on target to meet rehab goals: yes, currently min assist for Bathing and dressing and transfers with supervision goals set for discharge. Supervision for swallowing and basic cognition.  *See Care Plan and progress notes for long and short-term goals.   Revisions to Treatment Plan:  D2 trials  Teaching Needs: Transfers, toileting, medications, secondary stroke risk management, etc.  Current Barriers to Discharge: Decreased caregiver support and Home enviroment access/layout  Possible Resolutions to Barriers: Family education     Medical Summary Current Status: Emotional lability has improved, blood pressures mildly elevated at times but overall improving, blood sugars elevated  Barriers to Discharge: Medical  stability   Possible Resolutions to [employer]: Continue adjusting both oral antihyperglycemic agents as well as insulin   Continued Need for Acute Rehabilitation Level of Care: The patient requires daily medical management by a physician with specialized training in physical medicine and rehabilitation for the  following reasons: Direction of a multidisciplinary physical rehabilitation program to maximize functional independence : Yes Medical management of patient stability for increased activity during participation in an intensive rehabilitation regime.: Yes Analysis of laboratory values and/or radiology reports with any subsequent need for medication adjustment and/or medical intervention. : Yes   I attest that I was present, lead the team conference, and concur with the assessment and plan of the team.   Dorien Chihuahua B 05/19/2020, 2:40 PM

## 2020-05-19 NOTE — Progress Notes (Signed)
PROGRESS NOTE   Subjective/Complaints:  No Left shoulder c/os, trying to move thumb.  Asked about his eyeballs "feeling big"  No itching or pain or discharge, has Optho appt scheduled   ROS- neg CP, SOB, N/V/D Objective:   DG Shoulder Right  Result Date: 05/17/2020 CLINICAL DATA:  Right shoulder pain. EXAM: RIGHT SHOULDER - 2+ VIEW COMPARISON:  None. FINDINGS: There is no evidence of fracture or dislocation. Moderate degenerative changes seen involving the right acromioclavicular joint. Soft tissues are unremarkable. 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 No acute abnormality seen in the right shoulder. Electronically Signed   By: Marijo Conception M.D.   On: 05/17/2020 09:30   Recent Labs    05/17/20 0753  WBC 11.5*  HGB 13.2  HCT 40.2  PLT 226   Recent Labs    05/17/20 0753  NA 141  K 3.9  CL 107  CO2 27  GLUCOSE 231*  BUN 28*  CREATININE 1.97*  CALCIUM 8.6*    Intake/Output Summary (Last 24 hours) at 05/19/2020 0751 Last data filed at 05/19/2020 0727 Gross per 24 hour  Intake 840 ml  Output --  Net 840 ml        Physical Exam: Vital Signs Blood pressure 124/67, pulse 65, temperature 98.2 F (36.8 C), temperature source Oral, resp. rate 18, weight 99.4 kg, SpO2 95 %.   General: No acute distress Mood and affect are appropriate Heart: Regular rate and rhythm no rubs murmurs or extra sounds Lungs: Clear to auscultation, breathing unlabored, no rales or wheezes Abdomen: Positive bowel sounds, soft nontender to palpation, nondistended Extremities: No clubbing, cyanosis, or edema Skin: No evidence of breakdown, no evidence of rash  Cerebellar exam normal finger to nose to finger as well as heel to shin in Right upper and lower extremities Musculoskeletal:  Comments: Negative impingement sign RIght shoulder , no pain to palpation no deformity  Skin: General: Skin is  warmand dry.  Neurological:  Mental Status: He is alert.  Comments: Patient is alert. No acute distress. He does exhibit some left inattention. Speech is dysarthric but intelligible. Follows commands. Left central 7 and tongue deviation. Senses pain and light touch left face,arm, leg. DTR's 2+ LUE and LLE and 1+ on right. LUE: 1/5 delt, pec, biceps and 0/5 elsewhere. LLE: 3 to 3+/5 HF, KE and ADF/PF. Psychiatric:  Comments: Pleasant, no lability    Assessment/Plan: 1. Functional deficits which require 3+ hours per day of interdisciplinary therapy in a comprehensive inpatient rehab setting.  Physiatrist is providing close team supervision and 24 hour management of active medical problems listed below.  Physiatrist and rehab team continue to assess barriers to discharge/monitor patient progress toward functional and medical goals  Care Tool:  Bathing    Body parts bathed by patient: Chest,Abdomen,Left arm,Face,Front perineal area,Buttocks,Right upper leg,Left upper leg,Right lower leg,Left lower leg   Body parts bathed by helper: Right arm     Bathing assist Assist Level: Minimal Assistance - Patient > 75%     Upper Body Dressing/Undressing Upper body dressing   What is the patient wearing?: Pull over shirt    Upper body assist Assist Level: Minimal Assistance -  Patient > 75%    Lower Body Dressing/Undressing Lower body dressing      What is the patient wearing?: Incontinence brief,Pants     Lower body assist Assist for lower body dressing: Minimal Assistance - Patient > 75%     Toileting Toileting    Toileting assist Assist for toileting: 2 Helpers (patient voided in brief nurse applied primo fit for the night)     Transfers Chair/bed transfer  Transfers assist     Chair/bed transfer assist level: Contact Guard/Touching assist     Locomotion Ambulation   Ambulation assist      Assist level: Contact Guard/Touching assist Assistive  device: Walker-hemi Max distance: 210 feet   Walk 10 feet activity   Assist     Assist level: Contact Guard/Touching assist Assistive device: No Device   Walk 50 feet activity   Assist    Assist level: Contact Guard/Touching assist Assistive device: Walker-hemi    Walk 150 feet activity   Assist Walk 150 feet activity did not occur: Safety/medical concerns  Assist level: Contact Guard/Touching assist Assistive device: Walker-hemi    Walk 10 feet on uneven surface  activity   Assist     Assist level: Minimal Assistance - Patient > 75% Assistive device: Tree surgeon Will patient use wheelchair at discharge?: Yes Type of Wheelchair: Manual    Wheelchair assist level: Minimal Assistance - Patient > 75% Max wheelchair distance: 150    Wheelchair 50 feet with 2 turns activity    Assist        Assist Level: Minimal Assistance - Patient > 75%   Wheelchair 150 feet activity     Assist      Assist Level: Minimal Assistance - Patient > 75%   Blood pressure 124/67, pulse 65, temperature 98.2 F (36.8 C), temperature source Oral, resp. rate 18, weight 99.4 kg, SpO2 95 %.  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 Formulized individual treatment plan based on medical history, underlying problem and comorbidities.  2.  Impaired mobility:  -DVT/anticoagulation:SCDs -antiplatelet therapy: continue Aspirin and Plavix 75 mg daily x3 months then Plavix alone 3. Pain  Management:Denies neuropathic pain: decrease Lyrica to 25 mg twice daily. Add lidocaine patch for right shoulder.  4. Mood:Provide emotional support -antipsychotic agents: N/A -add nuedexta for PBA. Discussed with pt/wife who would like to try it. 5. Neuropsych: This patientiscapable of making decisions on hisown behalf. 6. Skin/Wound Care:Routine skin checks 7. Fluids/Electrolytes/Nutrition:Routine in and outs with follow-up may d/c IV 8. Dysphagia. Dysphagia #1 nectar liquids. Follow-up speech therapy 9. CKD stage IV. Admission BUN 2.26. Follow-up chemistries- at baseline 10. Diabetes mellitus with peripheral neuropathy. Hemoglobin A1c 8.1.  -NovoLog 8 units 3 times daily -increase Lantus to 43 U -Check blood sugars before meals and at bedtime  4/17: CBGs 211-376: Cr elevated. Increase Lantus to 44U CBG (last 3)  Recent Labs    05/18/20 1633 05/18/20 2110 05/19/20 0601  GLUCAP 158* 201* 164*  increase lantus to 48U on 4/18- will increase to 50U  11. Hypertension. Well controlled, continue Norvasc 2.5 mg daily. Monitor with increased mobility on rehab  Vitals:   05/18/20 1900 05/19/20 0603  BP: 138/72 124/67  Pulse: 73 65  Resp: 18 18  Temp: 98.4 F (36.9 C) 98.2 F (36.8 C)  SpO2: 97% 95%  controlled 4/20 12. Hyperlipidemia. Lipitor 13.Tobacco abuse.Counseling 14. Obesity BMI 32.36: provide counseling 15.  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

## 2020-05-20 DIAGNOSIS — I63511 Cerebral infarction due to unspecified occlusion or stenosis of right middle cerebral artery: Secondary | ICD-10-CM | POA: Diagnosis not present

## 2020-05-20 LAB — GLUCOSE, CAPILLARY
Glucose-Capillary: 180 mg/dL — ABNORMAL HIGH (ref 70–99)
Glucose-Capillary: 181 mg/dL — ABNORMAL HIGH (ref 70–99)
Glucose-Capillary: 277 mg/dL — ABNORMAL HIGH (ref 70–99)
Glucose-Capillary: 116 mg/dL — ABNORMAL HIGH (ref 70–99)

## 2020-05-20 MED ORDER — NAPHAZOLINE-PHENIRAMINE 0.025-0.3 % OP SOLN
1.0000 [drp] | Freq: Four times a day (QID) | OPHTHALMIC | Status: DC | PRN
  Administered 2020-05-20 – 2020-05-22 (×2): 1 [drp] via OPHTHALMIC
  Filled 2020-05-20: qty 15

## 2020-05-20 MED ORDER — PANTOPRAZOLE SODIUM 40 MG PO PACK
40.0000 mg | PACK | Freq: Every day | ORAL | Status: DC
  Filled 2020-05-20: qty 20

## 2020-05-20 MED ORDER — ASPIRIN 325 MG PO TABS
325.0000 mg | ORAL_TABLET | Freq: Every day | ORAL | Status: DC
  Administered 2020-05-21 – 2020-05-28 (×8): 325 mg via ORAL
  Filled 2020-05-20 (×8): qty 1

## 2020-05-20 MED ORDER — ASPIRIN 325 MG PO TABS: 325.0000 mg | ORAL_TABLET | Freq: Every day | ORAL | Status: DC

## 2020-05-20 NOTE — Progress Notes (Signed)
Occupational Therapy Session Note  Patient Details  Name: Thomas Sampson MRN: [id number] Date of Birth: 29-May-1945  Today's Date: 05/20/2020 OT Individual Time: 0881-1031 OT Individual Time Calculation (min): 54 min and 28 min   Short Term Goals: Week 1:  OT Short Term Goal 1 (Week 1): Pt complete LB dressing with Mod A using compensatory strategies as needed OT Short Term Goal 2 (Week 1): Pt will complete UB dressing with Mod A and hemi techniques OT Short Term Goal 3 (Week 1): Pt will complete toilet transfer with Min A and LRAD  Skilled Therapeutic Interventions/Progress Updates:    Session 1 [phone]): Pt received asleep in bed, easily awakened by voice, denies pain, but c/o eye irritation 2/2 cleaning materials used in room, agreeable to therapy. Came to sitting EOB with use of bed rail with close S, STS and amb to shower with QC with CGA. Doffed shirt close S and min VCs to remove from LUE. Doffed socks with mod A to remove R sock, able to hold RLE in figure four position. Bathed full-body with min A to bathe RUE, CGA for STS with use of grab bar. Able to doff brief with CGA for STS. Squat-pivot > w/c with min A to lift and pivot hips and manage LLE. Donned shirt light min A to thread sleeve over L wrist, min A to thread BLE for brief, and CGA to don pants. Donned Teds total A. Donned shoes with close S and set-up of shoes in front of patient. Completed 1x10 scapular protraction/retraction/elevation and standing push-ups against sink for improved LUE WB/NMR. Pt set-up with breakfast tray, req A to open items and season food.   Pt left in w/c with LUE supported, chair alarm alarm engaged, call bell in reach, and all immediate needs met. NT aware to provide S for pt eating breakfast.    Session 2 ([id number]): Pt received seated in w/c with family present, agreeable to therapy. W/c transport to and from gym 2/2 time management. Session focus on LUE NMR in prep for improved functional  return of LUE/bimanual ADL performance. With 2 lb dowel rod, pt completed 1x20 of B shoulder flexion and hold and chest press. Noted slight digit flexion/extensin in thumb/2nd and 3rd digit this date. Utilized ace wrap to facilitate L grasp. Educated family and pt on removing Givmohr sling when seated/not during functional mobility, pt is able to doff with mod A.   Pt left seated in w/c with family present, chair alarm engaged, call bell in reach, and all immediate needs met.    Therapy Documentation Precautions:  Precautions Precautions: Fall Precaution Comments: Lt inattention, Lt hemiparesis UE>LE Restrictions Weight Bearing Restrictions: No Pain:   see session note ADL: See Care Tool for more details.   Therapy/Group: Individual Therapy  Volanda Napoleon MS, OTR/L  05/20/2020, 6:42 AM

## 2020-05-20 NOTE — Progress Notes (Signed)
Physical Therapy Session Note  Patient Details  Name: Thomas Sampson MRN: [id number] Date of Birth: [birth year]  Today's Date: 05/20/2020 PT Individual Time: 1100-1130 PT Individual Time Calculation (min): 30 min   Short Term Goals: Week 1:  PT Short Term Goal 1 (Week 1): Pt will perform bed mobility with supervisoin assist PT Short Term Goal 2 (Week 1): Pt will ambulate 148ft with min assist PT Short Term Goal 3 (Week 1): Pt will transfer to and from [facility] with CGA PT Short Term Goal 4 (Week 1): Pt will propell WC 168ft wtih supervision assist  Skilled Therapeutic Interventions/Progress Updates:    Patient in w/c in room and reports worked a lot with previous PT.  Agreeable to balance test.  Pushed in w/c to therapy gym pt carrying quad cane and already wearing Giv Morh sling.  Patient performed Berg balance assessment as noted below.  Noted high fall risk based on test results 30/56 (scores 45 and less demonstrate high fall risk.)  Patient assisted in w/c to room.  Left with seat alarm active, needs close by and NT in the room. Therapy Documentation Precautions:  Precautions Precautions: Fall Precaution Comments: Lt inattention, Lt hemiparesis UE>LE Restrictions Weight Bearing Restrictions: No Pain: Pain Assessment Pain Score: 0-No pain  Balance: Standardized Balance Assessment Standardized Balance Assessment: Berg Balance Test Berg Balance Test Sit to Stand: Able to stand  independently using hands Standing Unsupported: Able to stand 2 minutes with supervision Sitting with Back Unsupported but Feet Supported on Floor or Stool: Able to sit safely and securely 2 minutes Stand to Sit: Controls descent by using hands Transfers: Able to transfer with verbal cueing and /or supervision Standing Unsupported with Eyes Closed: Able to stand 10 seconds with supervision Standing Ubsupported with Feet Together: Needs help to attain position and unable to hold for 15 seconds From Standing,  Reach Forward with Outstretched Arm: Can reach forward >5 cm safely (2") From Standing Position, Pick up Object from Floor: Able to pick up shoe, needs supervision From Standing Position, Turn to Look Behind Over each Shoulder: Turn sideways only but maintains balance Turn 360 Degrees: Needs close supervision or verbal cueing Standing Unsupported, Alternately Place Feet on Step/Stool: Able to complete >2 steps/needs minimal assist Standing Unsupported, One Foot in Front: Able to take small step independently and hold 30 seconds Standing on One Leg: Tries to lift leg/unable to hold 3 seconds but remains standing independently Total Score: 30    Therapy/Group: Individual Therapy  Reginia Naas  C[place], PT 05/20/2020, 12:15 PM

## 2020-05-20 NOTE — Progress Notes (Signed)
Physical Therapy Session Note  Patient Details  Name: Thomas Sampson MRN: [id number] Date of Birth: 16-Jun-1945  Today's Date: 05/20/2020 PT Individual Time: 0921-1000 PT Individual Time Calculation (min): 39 min   Short Term Goals: Week 1:  PT Short Term Goal 1 (Week 1): Pt will perform bed mobility with supervisoin assist PT Short Term Goal 2 (Week 1): Pt will ambulate 143f with min assist PT Short Term Goal 3 (Week 1): [facility] Short Term Goal 4 (Week 1): Pt will propell WC 1558fwtih supervision assist  Skilled Therapeutic Interventions/Progress Updates:   Pt received sitting in WC and agreeable to PT. [facility]  Gait training with QC x [id number] supervision assist-min assist with fatigue due to mild L lateral lean. Pt able to correct intermittently with cues.   Dynamic gait training to weave through 6 cones x 2 with QC. Verbal cues for increased step length on the LLE intermittently throughout as well as improved visual scanning to locate position of cones on pt's left. Side stepping in parallel bars with RUE support 8ft28f2 and no UE support 8ft 91f. Min assist from PT to improve posture and pelvic position to reduce compensation through trunk. Forward.reverse gait 3 x8ft w23f RUE support and 3x8ft wi10fno UE support. Min assist throughout for safety and improved step length on the LLE as well as improved trunkal control.    Standing NMR. recoprocal march x 12, hip abduction x 10, HS curl x 12. Cues for full ROM improved attention to the LLE, and midline orientation with SLS on the RLE to pervent LOB to the L. RUE supported on parallel bars throughout.   Patient returned to room and [facility] and all needs met.         Therapy Documentation Precautions:  Precautions Precautions: Fall Precaution Comments: Lt inattention, Lt hemiparesis UE>LE Restrictions Weight Bearing Restrictions:  No Vital Signs: Therapy Vitals BP: 114/68 Pain:   denies    Therapy/Group: Individual Therapy  [name] [name], 11:33 AM

## 2020-05-20 NOTE — Progress Notes (Signed)
Speech Language Pathology Daily Session Note  Patient Details  Name: Thomas Sampson MRN: [id number] Date of Birth: 22-Dec-1945  Today's Date: 05/20/2020 SLP Individual Time: 6226-3335 SLP Individual Time Calculation (min): 45 min  Short Term Goals: Week 1: SLP Short Term Goal 1 (Week 1): Pt will complete mildly complex problem solving tasks with min A verbal and visual cues. SLP Short Term Goal 2 (Week 1): Pt will recall novel/functional information with and without delay with min A cues for compensatory memory strategies SLP Short Term Goal 3 (Week 1): Pt will understand, recall and utilize strategies to increase speech intelligiblity at conversation level with min A verbal cues SLP Short Term Goal 4 (Week 1): Pt will consume trials ice chips and thin liquids with minimal overt s/s aspiration on 9/10 occurences. SLP Short Term Goal 5 (Week 1): Pt will consume trials Dys 2 solids with min A verbal cues to utilize compensatory strategies to increase bolus awareness  Skilled Therapeutic Interventions:   Patient seen for skilled ST session with focus on cognition and swallow function. He demonstrated good recall of recent events as well as swallow strategies and precautions that have previously been taught. Patient stood up at sink with SLP keeping WC behind him and he performed oral care. When swishing and spitting with water, min amount of what appeared to be residuals from breakfast were noted. During cup sips of water, patient exhibited immediate cough on one of approximately 6-7 sips. He felt that he had taken too large of a sip. Cough incident lasted approximately 45-60 seconds and afterwards patient did recover without change in vocal quality. He did report that he was coughing some this morning when he first woke up (before any PO's). Patient continues to benefit from skilled SLP intervention to maximize cognitive-linguistic and swallow function goals prior to discharge.  Pain Pain  Assessment Pain Scale: 0-10 Pain Score: 0-No pain  Therapy/Group: Individual Therapy  Sonia Baller, MA, CCC-SLP 05/20/20 12:30 PM

## 2020-05-20 NOTE — Progress Notes (Signed)
PROGRESS NOTE   Subjective/Complaints:  Scratchy throat , left eye red, was out with therapy yesterday   ROS- neg CP, SOB, N/V/D Objective:   No results found. Recent Labs    05/17/20 0753  WBC 11.5*  HGB 13.2  HCT 40.2  PLT 226   Recent Labs    05/17/20 0753  NA 141  K 3.9  CL 107  CO2 27  GLUCOSE 231*  BUN 28*  CREATININE 1.97*  CALCIUM 8.6*    Intake/Output Summary (Last 24 hours) at 05/20/2020 0741 Last data filed at 05/19/2020 1817 Gross per 24 hour  Intake 480 ml  Output 0 ml  Net 480 ml        Physical Exam: Vital Signs Blood pressure 112/63, pulse 67, temperature 97.9 F (36.6 C), temperature source Oral, resp. rate 16, weight 99.4 kg, SpO2 95 %.  HEENT- left eye injected no discharge or crusting General: No acute distress Mood and affect are appropriate Heart: Regular rate and rhythm no rubs murmurs or extra sounds Lungs: Clear to auscultation, breathing unlabored, no rales or wheezes Abdomen: Positive bowel sounds, soft nontender to palpation, nondistended Extremities: No clubbing, cyanosis, or edema Skin: No evidence of breakdown, no evidence of rash Musculoskeletal:  Comments: Negative impingement sign RIght shoulder , no pain to palpation no deformity  Skin: General: Skin is warmand dry.  Neurological:  Mental Status: He is alert.  Comments: Patient is alert. No acute distress. He does exhibit some left inattention. Speech is dysarthric but intelligible.  Senses pain and light touch left face,arm, leg. DTR's 2+ LUE and LLE and 1+ on right. LUE: 1/5 delt, pec, biceps and 0/5 elsewhere. LLE: 3 to 3+/5 HF, KE and ADF/PF. Psychiatric:  Comments: Pleasant, no lability    Assessment/Plan: 1. Functional deficits which require 3+ hours per day of interdisciplinary therapy in a comprehensive inpatient rehab setting.  Physiatrist is providing close team supervision and  24 hour management of active medical problems listed below.  Physiatrist and rehab team continue to assess barriers to discharge/monitor patient progress toward functional and medical goals  Care Tool:  Bathing    Body parts bathed by patient: Chest,Abdomen,Left arm,Face,Front perineal area,Buttocks,Right upper leg,Left upper leg,Right lower leg,Left lower leg   Body parts bathed by helper: Right arm     Bathing assist Assist Level: Minimal Assistance - Patient > 75%     Upper Body Dressing/Undressing Upper body dressing   What is the patient wearing?: Pull over shirt    Upper body assist Assist Level: Minimal Assistance - Patient > 75%    Lower Body Dressing/Undressing Lower body dressing      What is the patient wearing?: Underwear/pull up     Lower body assist Assist for lower body dressing: Minimal Assistance - Patient > 75%     Toileting Toileting    Toileting assist Assist for toileting: Minimal Assistance - Patient > 75%     Transfers Chair/bed transfer  Transfers assist     Chair/bed transfer assist level: Contact Guard/Touching assist     Locomotion Ambulation   Ambulation assist      Assist level: Contact Guard/Touching assist Assistive device: Cane-quad Max distance:  210 feet   Walk 10 feet activity   Assist     Assist level: Contact Guard/Touching assist Assistive device: No Device   Walk 50 feet activity   Assist    Assist level: Contact Guard/Touching assist Assistive device: Walker-hemi    Walk 150 feet activity   Assist Walk 150 feet activity did not occur: Safety/medical concerns  Assist level: Contact Guard/Touching assist Assistive device: Walker-hemi    Walk 10 feet on uneven surface  activity   Assist     Assist level: Minimal Assistance - Patient > 75% Assistive device: Tree surgeon Will patient use wheelchair at discharge?: Yes Type of Wheelchair: Manual    Wheelchair  assist level: Minimal Assistance - Patient > 75% Max wheelchair distance: 150    Wheelchair 50 feet with 2 turns activity    Assist        Assist Level: Minimal Assistance - Patient > 75%   Wheelchair 150 feet activity     Assist      Assist Level: Minimal Assistance - Patient > 75%   Blood pressure 112/63, pulse 67, temperature 97.9 F (36.6 C), temperature source Oral, resp. rate 16, weight 99.4 kg, SpO2 95 %.  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  Impaired mobility:  -DVT/anticoagulation:SCDs -antiplatelet therapy: continue Aspirin and Plavix 75 mg daily x3 months then Plavix alone 3. Pain Management:Denies neuropathic pain: decrease Lyrica to 25 mg twice daily. Add lidocaine patch for right shoulder.  4. Mood:Provide emotional support -antipsychotic agents: N/A -add nuedexta for PBA. Discussed with pt/wife who would like to try it. 5. Neuropsych: This patientiscapable of making decisions on hisown behalf. 6. Skin/Wound Care:Routine skin checks 7. Fluids/Electrolytes/Nutrition:Routine in and outs with follow-up may d/c IV 8. Dysphagia. Dysphagia #1 nectar liquids. Follow-up speech therapy 9. CKD stage IV. Admission BUN 2.26. Follow-up chemistries- at baseline 10. Diabetes mellitus with peripheral neuropathy. Hemoglobin A1c 8.1.  -NovoLog 8 units 3 times daily -increase Lantus to 43 U -Check blood sugars before meals and at bedtime  4/17: CBGs 211-376: Cr elevated. Increase Lantus to 44U CBG (last 3)  Recent Labs    05/19/20 1631 05/19/20 2054 05/20/20 0619   GLUCAP 199* 199* 180*  increase lantus to 48U on 4/18- will increase to 50U- improving now <200 12U novalog with meals  11. Hypertension. Well controlled, continue Norvasc 2.5 mg daily. Monitor with increased mobility on rehab Vitals:   05/19/20 1929 05/20/20 0500  BP: 131/65 112/63  Pulse: 67 67  Resp: 16 16  Temp: 98.4 F (36.9 C) 97.9 F (36.6 C)  SpO2: 95%   controlled 4/21 12. Hyperlipidemia. Lipitor 13.Tobacco abuse.Counseling 14. Obesity BMI 32.36: provide counseling 15.  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

## 2020-05-21 LAB — GLUCOSE, CAPILLARY
Glucose-Capillary: 201 mg/dL — ABNORMAL HIGH (ref 70–99)
Glucose-Capillary: 252 mg/dL — ABNORMAL HIGH (ref 70–99)
Glucose-Capillary: 139 mg/dL — ABNORMAL HIGH (ref 70–99)
Glucose-Capillary: 127 mg/dL — ABNORMAL HIGH (ref 70–99)

## 2020-05-21 MED ORDER — GLIPIZIDE ER 2.5 MG PO TB24
2.5000 mg | ORAL_TABLET | Freq: Every day | ORAL | Status: DC
  Filled 2020-05-21: qty 1

## 2020-05-21 MED ORDER — PANTOPRAZOLE SODIUM 40 MG PO PACK
40.0000 mg | PACK | Freq: Every day | ORAL | Status: DC
  Administered 2020-05-21 – 2020-05-25 (×5): 40 mg via ORAL
  Filled 2020-05-21 (×10): qty 20

## 2020-05-21 MED ORDER — GLIPIZIDE 5 MG PO TABS
2.5000 mg | ORAL_TABLET | Freq: Every day | ORAL | Status: DC
  Administered 2020-05-22 – 2020-05-28 (×7): 2.5 mg via ORAL
  Filled 2020-05-21 (×3): qty 0.5
  Filled 2020-05-21 (×2): qty 1
  Filled 2020-05-21 (×5): qty 0.5

## 2020-05-21 NOTE — Progress Notes (Signed)
Speech Language Pathology Weekly Progress and Session Note  Patient Details  Name: MATEEN FRANSSEN MRN: [id number] Date of Birth: 01/07/1946  Beginning of progress report period: 05/15/2020 End of progress report period: 05/21/2020  Today's Date: 05/21/2020 SLP Individual Time: 0233-4356 SLP Individual Time Calculation (min): 40 min  Short Term Goals: Week 1: SLP Short Term Goal 1 (Week 1): Pt will complete mildly complex problem solving tasks with min A verbal and visual cues. SLP Short Term Goal 1 - Progress (Week 1): Met SLP Short Term Goal 2 (Week 1): Pt will recall novel/functional information with and without delay with min A cues for compensatory memory strategies SLP Short Term Goal 2 - Progress (Week 1): Met SLP Short Term Goal 3 (Week 1): Pt will understand, recall and utilize strategies to increase speech intelligiblity at conversation level with min A verbal cues SLP Short Term Goal 3 - Progress (Week 1): Met SLP Short Term Goal 4 (Week 1): Pt will consume trials ice chips and thin liquids with minimal overt s/s aspiration on 9/10 occurences. SLP Short Term Goal 4 - Progress (Week 1): Progressing toward goal SLP Short Term Goal 5 (Week 1): Pt will consume trials Dys 2 solids with min A verbal cues to utilize compensatory strategies to increase bolus awareness SLP Short Term Goal 5 - Progress (Week 1): Progressing toward goal    New Short Term Goals: Week 2: SLP Short Term Goal 1 (Week 2): STG=LTG (ELOS: discharge 4/29)  Weekly Progress Updates:  Patient made very good progress and met all cognitive-linguistic and speech goals and is making good progress with swallow function goals. He is currently on Dys 1 solids, nectar thick liquids and completing trials of water/thin liquids with plan for repeat MBS next week.   Intensity: Minumum of 1-2 x/day, 30 to 90 minutes Frequency: 3 to 5 out of 7 days Duration/Length of Stay: 4/29 Treatment/Interventions: Cognitive  remediation/compensation;Speech/Language facilitation;Cueing hierarchy;Functional tasks;Therapeutic Activities;Therapeutic Exercise;Internal/external aids;Dysphagia/aspiration precaution training;Medication managment;Patient/family education   Daily Session  Skilled Therapeutic Interventions: Patient seen for skilled ST session with focus on swallow function. Patient exhibited one brief cough episode with first sip of thin liquids but on successive sips he did not and voice was clear throughout. Patient is very cautious and demonstrates consistent use of strategies during swallow trials of thin liquids. SLP initiated water protocol with patient and explained to him the rationale and precautions.    General    Pain  No c/o pain  Therapy/Group: Individual Therapy  Sonia Baller, MA, CCC-SLP Speech Therapy

## 2020-05-21 NOTE — Progress Notes (Addendum)
PROGRESS NOTE   Subjective/Complaints:  Left eye no longer itchy had naphcon drops yesterday   ROS- neg CP, SOB, N/V/D Objective:   No results found. No results for input(s): WBC, HGB, HCT, PLT in the last 72 hours. No results for input(s): NA, K, CL, CO2, GLUCOSE, BUN, CREATININE, CALCIUM in the last 72 hours.  Intake/Output Summary (Last 24 hours) at 05/21/2020 0734 Last data filed at 05/20/2020 1720 Gross per 24 hour  Intake 318 ml  Output --  Net 318 ml        Physical Exam: Vital Signs Blood pressure 121/67, pulse 66, temperature 98.6 F (37 C), temperature source Oral, resp. rate 16, weight 99.4 kg, SpO2 91 %.   General: No acute distress Mood and affect are appropriate Heart: Regular rate and rhythm no rubs murmurs or extra sounds Lungs: Clear to auscultation, breathing unlabored, no rales or wheezes Abdomen: Positive bowel sounds, soft nontender to palpation, nondistended Extremities: No clubbing, cyanosis, or edema Skin: No evidence of breakdown, no evidence of rash  Musculoskeletal:  Comments: no paijn with LUE ROM    Skin: General: Skin is warmand dry.  Neurological:  Mental Status: He is alert.  Comments: Patient is alert. No acute distress. He does exhibit some left inattention. Speech is dysarthric but intelligible.  Senses pain and light touch left face,arm, leg. DTR's 2+ LUE and LLE and 1+ on right. LUE: 1/5 delt, pec, biceps and 0/5 hand and wrist . LLE: 3 to 3+/5 HF, KE and ADF/PF. Psychiatric:  Comments: Pleasant, no lability    Assessment/Plan: 1. Functional deficits which require 3+ hours per day of interdisciplinary therapy in a comprehensive inpatient rehab setting.  Physiatrist is providing close team supervision and 24 hour management of active medical problems listed below.  Physiatrist and rehab team continue to assess barriers to discharge/monitor patient  progress toward functional and medical goals  Care Tool:  Bathing    Body parts bathed by patient: Chest,Abdomen,Left arm,Face,Front perineal area,Buttocks,Right upper leg,Left upper leg,Right lower leg,Left lower leg   Body parts bathed by helper: Right arm     Bathing assist Assist Level: Minimal Assistance - Patient > 75%     Upper Body Dressing/Undressing Upper body dressing   What is the patient wearing?: Pull over shirt    Upper body assist Assist Level: Minimal Assistance - Patient > 75%    Lower Body Dressing/Undressing Lower body dressing      What is the patient wearing?: Underwear/pull up,Pants     Lower body assist Assist for lower body dressing: Minimal Assistance - Patient > 75%     Toileting Toileting    Toileting assist Assist for toileting: Minimal Assistance - Patient > 75%     Transfers Chair/bed transfer  Transfers assist     Chair/bed transfer assist level: Contact Guard/Touching assist     Locomotion Ambulation   Ambulation assist      Assist level: Contact Guard/Touching assist Assistive device: Cane-quad Max distance: 210 feet   Walk 10 feet activity   Assist     Assist level: Contact Guard/Touching assist Assistive device: No Device   Walk 50 feet activity   Assist  Assist level: Contact Guard/Touching assist Assistive device: Walker-hemi    Walk 150 feet activity   Assist Walk 150 feet activity did not occur: Safety/medical concerns  Assist level: Contact Guard/Touching assist Assistive device: Walker-hemi    Walk 10 feet on uneven surface  activity   Assist     Assist level: Minimal Assistance - Patient > 75% Assistive device: Tree surgeon Will patient use wheelchair at discharge?: Yes Type of Wheelchair: Manual    Wheelchair assist level: Minimal Assistance - Patient > 75% Max wheelchair distance: 150    Wheelchair 50 feet with 2 turns  activity    Assist        Assist Level: Minimal Assistance - Patient > 75%   Wheelchair 150 feet activity     Assist      Assist Level: Minimal Assistance - Patient > 75%   Blood pressure 121/67, pulse 66, temperature 98.6 F (37 C), temperature source Oral, resp. rate 16, weight 99.4 kg, SpO2 91 %.  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  Impaired mobility:  -DVT/anticoagulation:SCDs -antiplatelet therapy: continue Aspirin and Plavix 75 mg daily x3 months then Plavix alone 3. Pain Management:Denies neuropathic pain: decrease Lyrica to 25 mg twice daily. Add lidocaine patch for right shoulder.  4. Mood:Provide emotional support -antipsychotic agents: N/A -add nuedexta for PBA. Discussed with pt/wife who would like to try it. 5. Neuropsych: This patientiscapable of making decisions on hisown behalf. 6. Skin/Wound Care:Routine skin checks 7. Fluids/Electrolytes/Nutrition:Routine in and outs with follow-up may d/c IV 8. Dysphagia. Dysphagia #1 nectar liquids. Follow-up speech therapy 9. CKD stage IV. Admission BUN 2.26. Follow-up chemistries- at baseline 10. Diabetes mellitus with peripheral neuropathy. Hemoglobin A1c 8.1.  -NovoLog 8 units 3 times daily -increase Lantus to 43 U -Check blood sugars before meals and at bedtime  4/17: CBGs 211-376: Cr elevated. Increase Lantus to 44U CBG (last 3)  Recent Labs    05/20/20 1624 05/20/20 2221 05/21/20 0524  GLUCAP 277* 116* 201*  increase lantus to 48U on 4/18- will increase to 50U- On glucotrol XL at home will start at  low dose 11. Hypertension. Well controlled, continue Norvasc 2.5 mg daily. Monitor with increased mobility on rehab Vitals:   05/20/20 1940 05/21/20 0526  BP: 128/65 121/67  Pulse: 65 66  Resp: 14 16  Temp: 98.5 F (36.9 C) 98.6 F (37 C)  SpO2: 96% 91%  controlled 4/22 12. Hyperlipidemia. Lipitor 13.Tobacco abuse.Counseling 14. Obesity BMI 32.36: provide counseling 15.  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

## 2020-05-21 NOTE — Progress Notes (Signed)
Physical Therapy Session Note  Patient Details  Name: Thomas Sampson MRN: [id number] Date of Birth: 02/27/1945  Today's Date: 05/21/2020 PT Individual Time: 0810-0918 PT Individual Time Calculation (min): 68 min   Short Term Goals: Week 1:  PT Short Term Goal 1 (Week 1): Pt will perform bed mobility with supervisoin assist PT Short Term Goal 2 (Week 1): Pt will ambulate 193f with min assist PT Short Term Goal 3 (Week 1): [facility] Short Term Goal 4 (Week 1): Pt will propell WC 1582fwtih supervision assist  Skilled Therapeutic Interventions/Progress Updates:   Pt received sitting in WC and agreeable to PT, but finishing breakfast and RN present for medication administration. PT returned in 10 minutes and pt able to begin PT treatment. Pt transported to rehab gym.   Standing NMR; foot tap on 6 inch step with UE support on the R. Performed 2 x 8 BLE with cues for midline and improved terminal knee extension on the LLE..   Gait training over level surface x 12044fith QC and givmohr sling to support the LUE. Dynamic gait training to step over 8 obstacles(1") on floor witrh QC. CGA throughout to improve posture and weight shift to the R and allow increased step length on the LLE.   nustep reciprocal activity training. X 8 min with BLE only. Cues for improved hip control to maintain neurtal hip ER/IR intemittently.   Oral hygiene at sink with with supevision assist for balance and min assist for set up.    Patient returned to room and [facility] and all needs met.         Therapy Documentation Precautions:  Precautions Precautions: Fall Precaution Comments: Lt inattention, Lt hemiparesis UE>LE Restrictions Weight Bearing Restrictions: No       Pain: denies   Therapy/Group: Individual Therapy  AusLorie Phenix22/2022, 7:49 PM

## 2020-05-21 NOTE — Progress Notes (Signed)
Physical Therapy Session Note  Patient Details  Name: Thomas Sampson MRN: [id number] Date of Birth: 02-Jan-1946  Today's Date: 05/21/2020 PT Individual Time: 1355-1445 PT Individual Time Calculation (min): 50 min   Short Term Goals: Week 1:  PT Short Term Goal 1 (Week 1): Pt will perform bed mobility with supervisoin assist PT Short Term Goal 2 (Week 1): Pt will ambulate 162ft with min assist PT Short Term Goal 3 (Week 1): Pt will transfer to and from [facility] with CGA PT Short Term Goal 4 (Week 1): Pt will propell WC 11ft wtih supervision assist  Skilled Therapeutic Interventions/Progress Updates: Pt presents sitting in w/c and agreeable to therapy.  Pt wheeled to small gym for time conservation.  Pt required assist to don GivMohr sling to LUE.  Pt initiates and requires assist for correct position and for L hand through.  Pt transfers multiple trials w/ CGA and verbal cues for hand placement, especially w/ stand to sit.  Pt amb multiple trials w/ SBQC up to 150' for straight distances, but 100-120' including turns to return to seat.  Noted left foot drag as fatigues, requiring increased verbal cues.  Pt amb 40-50' per trial w/ CGA w/o AD and decreasd step length and clearance.  Pt performed negotiation of 4 cone obstacle course w/o LOB and SBQC, as well as alternating toe-taps  On cones.  Pt negotiated ramp w/ SBQC and CGA , step in and out of hula hoops alternating LEs w/o AD.  Pt amb into room and transferred stand to sit to bed.  W/c brought into room and pt sidestepped to front of w/c and then stand to sit.  Seat alarm on and all needs in reach, family members present at conclusion.     Therapy Documentation Precautions:  Precautions Precautions: Fall Precaution Comments: Lt inattention, Lt hemiparesis UE>LE Restrictions Weight Bearing Restrictions: No General:   Vital Signs: Therapy Vitals Temp: 97.9 F (36.6 C) Pulse Rate: 69 Resp: 17 BP: 130/63 Patient Position (if appropriate):  Sitting Oxygen Therapy SpO2: 95 % O2 Device: Room Air Pain: no pain       Therapy/Group: Individual Therapy  Ladoris Gene 05/21/2020, 3:19 PM

## 2020-05-21 NOTE — Progress Notes (Signed)
Occupational Therapy Session Note  Patient Details  Name: Thomas Sampson MRN: [id number] Date of Birth: 1945-12-08  Today's Date: 05/21/2020 OT Individual Time: 1003-1103 OT Individual Time Calculation (min): 60 min    Short Term Goals: Week 1:  OT Short Term Goal 1 (Week 1): Pt complete LB dressing with Mod A using compensatory strategies as needed OT Short Term Goal 2 (Week 1): Pt will complete UB dressing with Mod A and hemi techniques OT Short Term Goal 3 (Week 1): Pt will complete toilet transfer with Min A and LRAD  Skilled Therapeutic Interventions/Progress Updates:    Pt received seated in w/c,  agreeable to therapy. Session focus on dynamic standing balance, activity tolerance, and LUE NMR in prep for functional return/improved bimanual ADL performance. W/c transport to and from gym 2/2 time management. Stand-pivot transfers throughout session with QC and close S. Stood to play 5 rounds of horse shoes with close S, able to keep track of points in head. Min VCs to locate horse shoes on L side, but able to self-correct remainder of session. Additionally, req seated rest break 2/2 fatigue between each round. Transitioned to supine on mat to complete 2x10 of L scapular protraction/retraction, shoulder horizontal abduction, and chest press/B shoulder flexion with 2 lb dowel rod and ace wrap to facilitate L grip. Transitioned to prone to complete 2x10 pulses on B forearms to facilitate LUE WB. Req seated rest break between each set. C/o minimal R shoulder pain 2/2 arthritis, ceases with rest.   Pt left in w/c with wife present, chair  alarm engaged, call bell in reach, and all immediate needs met.    Therapy Documentation Precautions:  Precautions Precautions: Fall Precaution Comments: Lt inattention, Lt hemiparesis UE>LE Restrictions Weight Bearing Restrictions: No Pain: see session note   ADL: See Care Tool for more details. Therapy/Group: Individual Therapy  Volanda Napoleon MS, OTR/L  05/21/2020, 12:33 PM

## 2020-05-22 DIAGNOSIS — I10 Essential (primary) hypertension: Secondary | ICD-10-CM

## 2020-05-22 DIAGNOSIS — E1169 Type 2 diabetes mellitus with other specified complication: Secondary | ICD-10-CM

## 2020-05-22 DIAGNOSIS — E669 Obesity, unspecified: Secondary | ICD-10-CM

## 2020-05-22 DIAGNOSIS — M940 Chondrocostal junction syndrome [Tietze]: Secondary | ICD-10-CM

## 2020-05-22 LAB — GLUCOSE, CAPILLARY
Glucose-Capillary: 112 mg/dL — ABNORMAL HIGH (ref 70–99)
Glucose-Capillary: 184 mg/dL — ABNORMAL HIGH (ref 70–99)
Glucose-Capillary: 187 mg/dL — ABNORMAL HIGH (ref 70–99)
Glucose-Capillary: 207 mg/dL — ABNORMAL HIGH (ref 70–99)

## 2020-05-22 NOTE — Progress Notes (Signed)
Physical Therapy Weekly Progress Note  Patient Details  Name: Thomas Sampson MRN: [id number] Date of Birth: 02/24/1945  Beginning of progress report period: May 15, 2020 End of progress report period: May 22, 2020  Today's Date: 05/22/2020 PT Individual Time: 4010-2725 PT Individual Time Calculation (min): 55 min   Patient has met 4 of 4 short term goals.  Pt is making steady progress towards LTG of supervision assist overall. Currently, pt has progressed to CGA for trnasfers and gait with QC with occassional min assist for safety in turns and for stair management. Pt continues to demonstrate mild hemiplegia, inattention to the L, and mild impulsivity, limiting increased progress to this point.   Patient continues to demonstrate the following deficits muscle weakness, muscle joint tightness and muscle paralysis, decreased cardiorespiratoy endurance, impaired timing and sequencing, abnormal tone, unbalanced muscle activation and decreased coordination, decreased midline orientation and decreased attention to left, decreased safety awareness and decreased sitting balance, decreased standing balance, decreased postural control, hemiplegia and decreased balance strategies and therefore will continue to benefit from skilled PT intervention to increase functional independence with mobility.  Patient progressing toward long term goals..  Continue plan of care.  PT Short Term Goals Week 1:  PT Short Term Goal 1 (Week 1): Pt will perform bed mobility with supervisoin assist PT Short Term Goal 1 - Progress (Week 1): Met PT Short Term Goal 2 (Week 1): Pt will ambulate 150f with min assist PT Short Term Goal 2 - Progress (Week 1): Met PT Short Term Goal 3 (Week 1): [facility] Short Term Goal 3 - Progress (Week 1): Met PT Short Term Goal 4 (Week 1): Pt will propell WC [id number] supervision assist PT Short Term Goal 4 - Progress (Week 1): Met Week 2:  PT Short  Term Goal 1 (Week 2): STG=LTG due to ELOS  Skilled Therapeutic Interventions/Progress Updates:   Pt received supine in bed and agreeable to PT. Supine>sit transfer with supervision A. PT assisted pt to don givmorh sling sitting EOB with education to son for proper use. Family present for entire session for education.   [facility] QC x 15037fith supervision assist cues for improved step width and attention to the L. PT instructed pt in step management training to simulate home environment x2  6 inch step with min assist with cues for step to gait pattern as well as cues for AD management.   Patient demonstrates increased fall risk as noted by score of  34 /56 on Berg Balance Scale.  (<36= high risk for falls, close to 100%; 37-45 significant >80%; 46-51 moderate >50%; 52-55 lower >25%)  Car transfer training with supervision assist and cues for safety. Pt then performed gait training over ramp with QC. Cues for AD management and safety over threshold with CGA-supervision assist from PT.   Patient returned to room and left sitting [facility] in reach and all needs met.         Therapy Documentation Precautions:  Precautions Precautions: Fall Precaution Comments: Lt inattention, Lt hemiparesis UE>LE Restrictions Weight Bearing Restrictions: No    Vital Signs: Therapy Vitals Temp: 97.9 F (36.6 C) Temp Source: Oral Pulse Rate: 69 Resp: 18 BP: 114/61 Patient Position (if appropriate): Sitting Oxygen Therapy SpO2: 97 % O2 Device: Room Air Pain: denies    Balance: BerFurniture conservator/restorert to Stand: Able to stand  independently using hands Standing Unsupported: Able to stand 2 minutes with supervision Sitting with Back Unsupported but Feet Supported on Floor or Stool: Able to sit safely and securely 2 minutes Stand to Sit: Controls descent by using hands Transfers: Able to transfer safely, definite need of hands Standing  Unsupported with Eyes Closed: Able to stand 10 seconds with supervision Standing Ubsupported with Feet Together: Needs help to attain position but able to stand for 30 seconds with feet together From Standing, Reach Forward with Outstretched Arm: Can reach forward >12 cm safely (5") From Standing Position, Pick up Object from Floor: Able to pick up shoe, needs supervision From Standing Position, Turn to Look Behind Over each Shoulder: Looks behind one side only/other side shows less weight shift Turn 360 Degrees: Needs close supervision or verbal cueing Standing Unsupported, Alternately Place Feet on Step/Stool: Able to complete >2 steps/needs minimal assist Standing Unsupported, One Foot in Front: Able to take small step independently and hold 30 seconds Standing on One Leg: Tries to lift leg/unable to hold 3 seconds but remains standing independently Total Score: 34   Therapy/Group: Individual Therapy  Lorie Phenix 05/22/2020, 2:46 PM

## 2020-05-22 NOTE — Progress Notes (Signed)
Speech Language Pathology Daily Session Note  Patient Details  Name: AXL RODINO MRN: [id number] Date of Birth: 1945-04-24  Today's Date: 05/22/2020 SLP Individual Time: 3976-7341 SLP Individual Time Calculation (min): 30 min  Short Term Goals: Week 2: SLP Short Term Goal 1 (Week 2): STG=LTG (ELOS: discharge 4/29)  Skilled Therapeutic Interventions: Pt seen for skilled ST with focus on dysphagia goals, wife and son present throughout. Pt and wife with questions regarding Free Water Protocol, reviewed protocol utilizing posted visual aid. Pt completing oral care prior to PO.  Pt observed with 8 oz thin liquid and Dys 2 snack. Pt with min L anterior loss of Dys 2 bolus when talking while eating, no significant L buccal stasis noted across trials. 2 episodes delayed cough with thin liquids this date, no change in vocal quality before or after. Pt independently able to recall plan for [facility] Tuesday 4/26. Pt left in chair with alarm set and family present. Cont ST POC for dysphagia management.   Pain None reported  Therapy/Group: Individual Therapy  Dewaine Conger 05/22/2020, 2:36 PM

## 2020-05-22 NOTE — Progress Notes (Signed)
Occupational Therapy Weekly Progress Note  Patient Details  Name: Thomas Sampson MRN: [id number] Date of Birth: 12/22/1945  Beginning of progress report period: May 15, 2020 End of progress report period: May 22, 2020    Patient has met 3 of 3 short term goals. Pt has made significant progress with OT this reporting period, demonstrating improved activity tolerance, carryover hemi-techniques and safety precautions, dynamic standing balance to complete functional mobility at CGA level and ADL at min to S level. LUE cont to be at a level II to III and hand at I to II. Anticipate pt will need S to [place] assist for functional mobility upon DC home.  Patient continues to demonstrate the following deficits: muscle weakness, decreased cardiorespiratoy endurance, impaired timing and sequencing, abnormal tone, unbalanced muscle activation, motor apraxia and decreased coordination and decreased standing balance, decreased postural control, hemiplegia and decreased balance strategies and therefore will continue to benefit from skilled OT intervention to enhance overall performance with BADL and Reduce care partner burden.  Patient progressing toward long term goals..  Continue plan of care.  OT Short Term Goals Week 2:  OT Short Term Goal 1 (Week 2): STG = LTG 2/2 ELOS  Therapy Documentation Precautions:  Precautions Precautions: Fall Precaution Comments: Lt inattention, Lt hemiparesis UE>LE Restrictions Weight Bearing Restrictions: No   Volanda Napoleon MS, OTR/L  05/22/2020, 3:10 PM

## 2020-05-22 NOTE — Progress Notes (Signed)
PROGRESS NOTE   Subjective/Complaints:  Has had chronic costochondritis.  Work on walker, transferring seems to have exacerbated it a bit  ROS: Patient denies fever, rash, sore throat, blurred vision, nausea, vomiting, diarrhea, cough, shortness of breath or chest pain, headache, or mood change.   .  Objective:   No results found. No results for input(s): WBC, HGB, HCT, PLT in the last 72 hours. No results for input(s): NA, K, CL, CO2, GLUCOSE, BUN, CREATININE, CALCIUM in the last 72 hours.  Intake/Output Summary (Last 24 hours) at 05/22/2020 1157 Last data filed at 05/22/2020 0715 Gross per 24 hour  Intake 390 ml  Output --  Net 390 ml        Physical Exam: Vital Signs Blood pressure 121/72, pulse 65, temperature 98.3 F (36.8 C), temperature source Oral, resp. rate 16, weight 99.4 kg, SpO2 97 %.   Constitutional: No distress . Vital signs reviewed. HEENT: EOMI, oral membranes moist Neck: supple Cardiovascular: RRR without murmur. No JVD    Respiratory/Chest: CTA Bilaterally without wheezes or rales. Normal effort    GI/Abdomen: BS +, non-tender, non-distended Ext: no clubbing, cyanosis, or edema Psych: pleasant and cooperative Skin: No evidence of breakdown, no evidence of rash  Musculoskeletal:  Comments: sternal pain with palpation and deep breaths.    Skin: General: Skin is warmand dry.  Neurological:  Mental Status: He is alert.  Comments: Patient is alert. No acute distress. He does exhibit some left inattention. Speech is dysarthric but intelligible.  Senses pain and light touch left face,arm, leg. DTR's 2+ LUE and LLE and 1+ on right. LUE: 2/5 delt, pec, biceps and 1/5 hand and wrist . LLE:  3+/5 HF, KE and ADF/PF. Psychiatric:  Comments: emotional lability better    Assessment/Plan: 1. Functional deficits which require 3+ hours per day of interdisciplinary therapy in a  comprehensive inpatient rehab setting.  Physiatrist is providing close team supervision and 24 hour management of active medical problems listed below.  Physiatrist and rehab team continue to assess barriers to discharge/monitor patient progress toward functional and medical goals  Care Tool:  Bathing    Body parts bathed by patient: Chest,Abdomen,Left arm,Face,Front perineal area,Buttocks,Right upper leg,Left upper leg,Right lower leg,Left lower leg   Body parts bathed by helper: Right arm     Bathing assist Assist Level: Minimal Assistance - Patient > 75%     Upper Body Dressing/Undressing Upper body dressing   What is the patient wearing?: Pull over shirt    Upper body assist Assist Level: Minimal Assistance - Patient > 75%    Lower Body Dressing/Undressing Lower body dressing      What is the patient wearing?: Underwear/pull up     Lower body assist Assist for lower body dressing: Minimal Assistance - Patient > 75%     Toileting Toileting    Toileting assist Assist for toileting: Minimal Assistance - Patient > 75%     Transfers Chair/bed transfer  Transfers assist     Chair/bed transfer assist level: Contact Guard/Touching assist     Locomotion Ambulation   Ambulation assist      Assist level: Contact Guard/Touching assist Assistive device: Cane-quad Max distance: 150  Walk 10 feet activity   Assist     Assist level: Contact Guard/Touching assist Assistive device: Cane-quad   Walk 50 feet activity   Assist    Assist level: Contact Guard/Touching assist Assistive device: Cane-quad    Walk 150 feet activity   Assist Walk 150 feet activity did not occur: Safety/medical concerns  Assist level: Contact Guard/Touching assist Assistive device: Cane-quad    Walk 10 feet on uneven surface  activity   Assist     Assist level: Minimal Assistance - Patient > 75% Assistive device: Tree surgeon Will  patient use wheelchair at discharge?: Yes Type of Wheelchair: Manual    Wheelchair assist level: Minimal Assistance - Patient > 75% Max wheelchair distance: 150    Wheelchair 50 feet with 2 turns activity    Assist        Assist Level: Minimal Assistance - Patient > 75%   Wheelchair 150 feet activity     Assist      Assist Level: Minimal Assistance - Patient > 75%   Blood pressure 121/72, pulse 65, temperature 98.3 F (36.8 C), temperature source Oral, resp. rate 16, weight 99.4 kg, SpO2 97 %.  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  Impaired mobility:  -DVT/anticoagulation:SCDs -antiplatelet therapy: continue Aspirin and Plavix 75 mg daily x3 months then Plavix alone 3. Pain Management:Denies neuropathic pain: decrease Lyrica to 25 mg twice daily. Add lidocaine patch for right shoulder.  4. Mood:Provide emotional support -antipsychotic agents: N/A -added nuedexta for PBA with good results 5. Neuropsych: This patientiscapable of making decisions on hisown behalf. 6. Skin/Wound Care:Routine skin checks 7. Fluids/Electrolytes/Nutrition:Routine in and outs with follow-up may d/c IV 8. Dysphagia. Dysphagia #1 nectar liquids. Follow-up speech therapy 9. CKD stage IV. Admission BUN 2.26. Follow-up chemistries- at baseline 10. Diabetes mellitus with peripheral neuropathy. Hemoglobin A1c 8.1.  -NovoLog 8 units 3 times daily -increase Lantus to 43 U -Check blood sugars before meals and at bedtime  4/17: CBGs 211-376: Cr elevated.  Increase Lantus to 44U CBG (last 3)  Recent Labs    05/21/20 2124 05/22/20 0519 05/22/20 1108  GLUCAP 127* 112* 184*  increase lantus to 48U on 4/18- will increase to 50U- On glucotrol XL at home.started at low dose -4/23 improving glucose control, observe today 11. Hypertension. Well controlled, continue Norvasc 2.5 mg daily. Monitor with increased mobility on rehab Vitals:   05/21/20 1253 05/21/20 2031  BP: 130/63 121/72  Pulse: 69 65  Resp: 17 16  Temp: 97.9 F (36.6 C) 98.3 F (36.8 C)  SpO2: 95% 97%  controlled 4/23 12. Hyperlipidemia. Lipitor 13.Tobacco abuse.Counseling 14. Obesity BMI 32.36: provide counseling 15.  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 Also with costochondritis   -ice, topical remedies including voltaren gel  LOS: 8 days A FACE TO FACE EVALUATION WAS PERFORMED  FAOLAN SPRINGFIELD 05/22/2020, 11:57 AM

## 2020-05-22 NOTE — Plan of Care (Signed)
  Problem: Consults Goal: RH STROKE PATIENT EDUCATION Description: See Patient Education module for education specifics  Outcome: Progressing Goal: Nutrition Consult-if indicated Outcome: Progressing Goal: Diabetes Guidelines if Diabetic/Glucose > 140 Description: If diabetic or lab glucose is > 140 mg/dl - Initiate Diabetes/Hyperglycemia Guidelines & Document Interventions  Outcome: Progressing   Problem: RH BOWEL ELIMINATION Goal: RH STG MANAGE BOWEL WITH ASSISTANCE Description: STG Manage Bowel with mod I Assistance. Outcome: Progressing Goal: RH STG MANAGE BOWEL W/MEDICATION W/ASSISTANCE Description: STG Manage Bowel with Medication with mod I Assistance. Outcome: Progressing   Problem: RH BLADDER ELIMINATION Goal: RH STG MANAGE BLADDER WITH ASSISTANCE Description: STG Manage Bladder With mod I Assistance Outcome: Progressing   Problem: RH SKIN INTEGRITY Goal: RH STG SKIN FREE OF INFECTION/BREAKDOWN Description: With mod I assist Outcome: Progressing Goal: RH STG MAINTAIN SKIN INTEGRITY WITH ASSISTANCE Description: STG Maintain Skin Integrity With min Assistance. Outcome: Progressing Goal: RH STG ABLE TO PERFORM INCISION/WOUND CARE W/ASSISTANCE Description: STG Able To Perform Incision/Wound Care With min Assistance. Outcome: Progressing   Problem: RH SAFETY Goal: RH STG ADHERE TO SAFETY PRECAUTIONS W/ASSISTANCE/DEVICE Description: STG Adhere to Safety Precautions With cues/reminders Assistance/Device. Outcome: Progressing Goal: RH STG DECREASED RISK OF FALL WITH ASSISTANCE Description: STG Decreased Risk of Fall With cues/reminders Assistance. Outcome: Progressing   Problem: RH COGNITION-NURSING Goal: RH STG USES MEMORY AIDS/STRATEGIES W/ASSIST TO PROBLEM SOLVE Description: STG Uses Memory Aids/Strategies With cues/reminders Assistance to Problem Solve. Outcome: Progressing Goal: RH STG ANTICIPATES NEEDS/CALLS FOR ASSIST W/ASSIST/CUES Description: STG Anticipates  Needs/Calls for Assist With cues/reminders  Assistance/Cues. Outcome: Progressing   Problem: RH PAIN MANAGEMENT Goal: RH STG PAIN MANAGED AT OR BELOW PT'S PAIN GOAL Description: <3 out of 10 on pain scale Outcome: Progressing   Problem: RH KNOWLEDGE DEFICIT Goal: RH STG INCREASE KNOWLEDGE OF DIABETES Description: Patient will be able to manage DM with medications and dietary modifications using handouts and educational tools with cues/reminders assist  Outcome: Progressing Goal: RH STG INCREASE KNOWLEDGE OF HYPERTENSION Description: Patient will be able to manage HTN with medications and dietary modifications using handouts and educational tools with cues/reminders assist  Outcome: Progressing Goal: RH STG INCREASE KNOWLEDGE OF DYSPHAGIA/FLUID INTAKE Description: Patient will be able to manage Dysphagia: nutrition and dietary modifications using handouts and educational tools with cues/reminders assist  Outcome: Progressing Goal: RH STG INCREASE KNOWLEGDE OF HYPERLIPIDEMIA Description: Patient will be able to manage HLD with medications and dietary modifications using handouts and educational tools with min assist  Outcome: Progressing Goal: RH STG INCREASE KNOWLEDGE OF STROKE PROPHYLAXIS Description: Patient will be able to manage secondary stroke risks with medications and dietary modifications using handouts and educational tools with cues/reminders assist Outcome: Progressing

## 2020-05-23 LAB — GLUCOSE, CAPILLARY
Glucose-Capillary: 181 mg/dL — ABNORMAL HIGH (ref 70–99)
Glucose-Capillary: 188 mg/dL — ABNORMAL HIGH (ref 70–99)

## 2020-05-23 NOTE — Progress Notes (Signed)
Occupational Therapy Session Note  Patient Details  Name: Thomas Sampson MRN: [id number] Date of Birth: Jul 13, 1945  Today's Date: 05/23/2020 OT Individual Time: 1300-1400 OT Individual Time Calculation (min): 60 min   Skilled Therapeutic Interventions/Progress Updates:    Pt greeted EOB with no c/o pain. ADL needs met, pt requesting to work on his Lt arm. Note that pt has some activation throughout his affected limb, proximal>distal. Started with active assist shoulder flexion/extension and circumduction exercises until reaching the point of fatigue. Transitioned to graded grasp/release with supination and then grasp/release transferring items from different surfaces. Pt needed Min facilitation for forearm guidance, Max facilitation to open palm due to flexor synergy. Pt able to squeeze, the most active isolated digit movements noted in the thumb and index finger. He was very motivated to do as much as he was able to with the Lt UE. Prolonged digit/thumb extension stretches completed to offset tone and improve his ability to open palm functionally. Family arrived at end of session and educated them on therapeutic activities they could set him up with in the room to promote further return in his affected UE. Supervision for scooting up towards [facility] with vcs to weightbear through the Lt hand. Pt able to return to flat bed without bedrail with supervision, bridging with Min A for Lt LE positioning so that we could readjust his chuck pad. Pt remained in bed at close of session, all needs within reach and bed alarm set.   Therapy Documentation Precautions:  Precautions Precautions: Fall Precaution Comments: Lt inattention, Lt hemiparesis UE>LE Restrictions Weight Bearing Restrictions: No ADL: ADL Eating: Not assessed Grooming: Moderate assistance Where Assessed-Grooming: Edge of bed Upper Body Bathing: Moderate assistance Where Assessed-Upper Body Bathing: Edge of bed Lower Body Bathing:  Moderate assistance Where Assessed-Lower Body Bathing: Edge of bed,Other (Comment) (feet washed EOB, remainder of bathing completed sit<stand from [facility]) Upper Body Dressing: Maximal assistance Where Assessed-Upper Body Dressing: Other (Comment) (sitting on BSC) Lower Body Dressing: Maximal assistance Toileting: Moderate assistance Where Assessed-Toileting: Bedside Commode Toilet Transfer: Moderate assistance Toilet Transfer Method: Stand pivot (hemi walker) Toilet Transfer Equipment: Bedside commode Tub/Shower Transfer: Not assessed      Therapy/Group: Individual Therapy  Darianny Momon A Rokia Bosket 05/23/2020, 4:05 PM

## 2020-05-24 ENCOUNTER — Encounter: Payer: Medicare Other | Admitting: Student

## 2020-05-24 LAB — GLUCOSE, CAPILLARY
Glucose-Capillary: 128 mg/dL — ABNORMAL HIGH (ref 70–99)
Glucose-Capillary: 157 mg/dL — ABNORMAL HIGH (ref 70–99)
Glucose-Capillary: 159 mg/dL — ABNORMAL HIGH (ref 70–99)
Glucose-Capillary: 194 mg/dL — ABNORMAL HIGH (ref 70–99)
Glucose-Capillary: 162 mg/dL — ABNORMAL HIGH (ref 70–99)
Glucose-Capillary: 176 mg/dL — ABNORMAL HIGH (ref 70–99)

## 2020-05-24 NOTE — Progress Notes (Signed)
Speech Language Pathology Daily Session Note  Patient Details  Name: Thomas Sampson MRN: [id number] Date of Birth: 07/28/1945  Today's Date: 05/24/2020 SLP Individual Time: 0800-0830 SLP Individual Time Calculation (min): 30 min  Short Term Goals: Week 2: SLP Short Term Goal 1 (Week 2): STG=LTG (ELOS: discharge 4/29)  Skilled Therapeutic Interventions:Skilled ST services focused on cognitive skills. SLP facilitated mildly complex medication management with TIB pill organizer, pt required supervision A verbal cues for problem solving and supervision A verbal cues to recall medication name/function/times per day with list. SLP educated pt on planned MBS tomorrow, all questions answered to satisfaction. Pt was left in room with call bell within reach and chair alarm set. SLP recommends to continue skilled services.     Pain    Therapy/Group: Individual Therapy  [name]  [facility] 05/24/2020, 7:47 AM

## 2020-05-24 NOTE — Progress Notes (Addendum)
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 Due for wound check visit Tegaderm remained in place and removed without difficulty Steri strips also removed without difficulty Wound edges are well approximated and healing well No bleeding, hematoma, no erythema edema or increased heat to the surrounding tissues.  Transmitter is at bedside Transmission from today reviewed One pause episode was implant.  No AF to date  Discussed with patient.  Out pateint follow up PRN pending any device observations  Tommye Standard, PA-C

## 2020-05-24 NOTE — Progress Notes (Addendum)
Patient ID: Thomas Sampson, male   DOB: 09/14/1945, 75 y.o.   MRN: [id number]  Pt referral faxed to [facility].  *Pt declined due to high capacity.  [place], [place]

## 2020-05-24 NOTE — Progress Notes (Signed)
PROGRESS NOTE   Subjective/Complaints: Still with swallowing issues but has repeat MBS this week  Asking to have loop recorder checked by cardiology  ROS: Patient denies fever, CP, SOB, N/V/D  .  Objective:   No results found. No results for input(s): WBC, HGB, HCT, PLT in the last 72 hours. No results for input(s): NA, K, CL, CO2, GLUCOSE, BUN, CREATININE, CALCIUM in the last 72 hours.  Intake/Output Summary (Last 24 hours) at 05/24/2020 0800 Last data filed at 05/23/2020 1740 Gross per 24 hour  Intake 278 ml  Output --  Net 278 ml        Physical Exam: Vital Signs Blood pressure 123/77, pulse 70, temperature 98.4 F (36.9 C), temperature source Oral, resp. rate 18, weight 99.4 kg, SpO2 94 %.  General: No acute distress Mood and affect are appropriate Heart: Regular rate and rhythm no rubs murmurs or extra sounds Lungs: Clear to auscultation, breathing unlabored, no rales or wheezes Abdomen: Positive bowel sounds, soft nontender to palpation, nondistended Extremities: No clubbing, cyanosis, or edema Skin: No evidence of breakdown, no evidence of rash Musculoskeletal:  Comments: sternal pain with palpation and deep breaths.    Skin: General: Skin is warmand dry.  Neurological:  Mental Status: He is alert.  Comments: Patient is alert. No acute distress.  Speech is dysarthric but intelligible.  DTR's 2+ LUE and LLE and 1+ on right. LUE: 2/5 delt, pec, biceps and 1/5 hand and wrist . LLE:  3+/5 HF, KE and ADF/PF. Psychiatric:  Commentsno lability    Assessment/Plan: 1. Functional deficits which require 3+ hours per day of interdisciplinary therapy in a comprehensive inpatient rehab setting.  Physiatrist is providing close team supervision and 24 hour management of active medical problems listed below.  Physiatrist and rehab team continue to assess barriers to discharge/monitor patient  progress toward functional and medical goals  Care Tool:  Bathing    Body parts bathed by patient: Chest,Abdomen,Left arm,Face,Front perineal area,Buttocks,Right upper leg,Left upper leg,Right lower leg,Left lower leg   Body parts bathed by helper: Right arm     Bathing assist Assist Level: Minimal Assistance - Patient > 75%     Upper Body Dressing/Undressing Upper body dressing   What is the patient wearing?: Pull over shirt    Upper body assist Assist Level: Minimal Assistance - Patient > 75%    Lower Body Dressing/Undressing Lower body dressing      What is the patient wearing?: Underwear/pull up     Lower body assist Assist for lower body dressing: Minimal Assistance - Patient > 75%     Toileting Toileting    Toileting assist Assist for toileting: Minimal Assistance - Patient > 75%     Transfers Chair/bed transfer  Transfers assist     Chair/bed transfer assist level: Contact Guard/Touching assist     Locomotion Ambulation   Ambulation assist      Assist level: Contact Guard/Touching assist Assistive device: Cane-quad Max distance: 150   Walk 10 feet activity   Assist     Assist level: Contact Guard/Touching assist Assistive device: Cane-quad   Walk 50 feet activity   Assist    Assist level: Contact Guard/Touching  assist Assistive device: Cane-quad    Walk 150 feet activity   Assist Walk 150 feet activity did not occur: Safety/medical concerns  Assist level: Contact Guard/Touching assist Assistive device: Cane-quad    Walk 10 feet on uneven surface  activity   Assist     Assist level: Minimal Assistance - Patient > 75% Assistive device: Tree surgeon Will patient use wheelchair at discharge?: Yes Type of Wheelchair: Manual    Wheelchair assist level: Minimal Assistance - Patient > 75% Max wheelchair distance: 150    Wheelchair 50 feet with 2 turns activity    Assist         Assist Level: Minimal Assistance - Patient > 75%   Wheelchair 150 feet activity     Assist      Assist Level: Minimal Assistance - Patient > 75%   Blood pressure 123/77, pulse 70, temperature 98.4 F (36.9 C), temperature source Oral, resp. rate 18, weight 99.4 kg, SpO2 94 %.  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  Impaired mobility:  -DVT/anticoagulation:SCDs -antiplatelet therapy: continue Aspirin and Plavix 75 mg daily x3 months then Plavix alone 3. Pain Management:Denies neuropathic pain: decrease Lyrica to 25 mg twice daily. Add lidocaine patch for right shoulder.  4. Mood:Provide emotional support -antipsychotic agents: N/A -added nuedexta for PBA with good results 5. Neuropsych: This patientiscapable of making decisions on hisown behalf. 6. Skin/Wound Care:Routine skin checks 7. Fluids/Electrolytes/Nutrition:Routine in and outs with follow-up may d/c IV 8. Dysphagia. Dysphagia #1 nectar liquids. Follow-up speech therapy 9. CKD stage IV. Admission BUN 2.26. Follow-up chemistries- at baseline 10. Diabetes mellitus with peripheral neuropathy. Hemoglobin A1c 8.1.  -NovoLog 8 units 3 times daily -increase Lantus to 43 U -Check blood sugars before meals and at bedtime  4/17: CBGs 211-376: Cr elevated. Increase Lantus to 44U CBG (last 3)  Recent Labs    05/23/20 0600 05/23/20 1134 05/24/20 0553  GLUCAP 181* 188* 159*  fair control on lantus 50U with novolog 11. Hypertension. Well controlled, continue Norvasc 2.5 mg daily.  Monitor with increased mobility on rehab Vitals:   05/23/20 1923 05/24/20 0552  BP: 118/65 123/77  Pulse: 72 70  Resp: 18 18  Temp: 98.1 F (36.7 C) 98.4 F (36.9 C)  SpO2: 99% 94%  controlled 4/25 12. Hyperlipidemia. Lipitor 13.Tobacco abuse.Counseling 14. Obesity BMI 32.36: provide counseling 15.  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 Also with costochondritis   -ice, topical remedies including voltaren gel  LOS: 10 days A FACE TO FACE EVALUATION WAS PERFORMED  Charlett Blake 05/24/2020, 8:00 AM

## 2020-05-24 NOTE — Progress Notes (Signed)
Physical Therapy Session Note  Patient Details  Name: Thomas Sampson MRN: [id number] Date of Birth: Dec 09, 1945  Today's Date: 05/24/2020 PT Individual Time: 1515-1600 PT Individual Time Calculation (min): 45 min   Short Term Goals: Week 2:  PT Short Term Goal 1 (Week 2): STG=LTG due to ELOS  Skilled Therapeutic Interventions/Progress Updates:  Session 1: Patient seated upright in recliner on entrance to room. PA from Electrophysiology removing steristrips from pt's chest and noting that pt is healing well. Is allowed to shower with gentle bathing but do not perform any vigorous scrubbing to site.   Patient alert and agreeable to PT session. Patient denied pain during session.  Therapeutic Activity: Transfers: Patient performed STS and SPVT transfers throughout session with supervision. Focus on ability to perform with no UE support other than to thigh for assist. Provided verbal cues for technique intermittently.  5xSTS performed with no UE support except on R thigh and completed in 18.8sec.   Gait Training:  Patient ambulated 150' x1/ 29' x1 using HW with supervision/ intermittent CGA. Demonstrated decreased step length/ height with fatigue and downward gaze to feet in order to gauge step length with each stride. Provided vc/ tc for upright posture, level gaze with no change to quality of gait.   Pt performs four 6" steps using BHR and ascending step-to gait pattern with LLE first stating that's how he "always did it". Good strength noted in ascension and completes with supervision. Steps more tentatively to descend with LLE first requiring CGA and vc for hand progression.   Neuromuscular Re-ed: NMR facilitated during session with focus on standing balance. Pt guided in static and dynamic stepping as well as clearance of hurdles. Is able to complete hurdles with prior visual and verbal demonstration. Pt is hesitant to perform but is able to clear all hurdles x6 with conscious stepping  and pause to ensure clearance. NMR performed for improvements in motor control and coordination, balance, sequencing, judgement, and self confidence/ efficacy in performing all aspects of mobility at highest level of independence.   Patient seated upright in w/c at end of session with brakes locked, belt alarm set, and all needs within reach.  Session 2: Patient seated upright in w/c on entrance to room. Wife and brother present. Patient alert and agreeable to PT session. Patient denied pain during session.  Therapeutic Activity: Bed Mobility: Patient performed supine <> sit with supervision at end of session. Able to enter bed from L side as he would at home.  VC/ tc required for technique. Transfers: Patient performed STS and SPVT transfers throughout session with focus on no UE support in order to improve BLE control.  Provided intermittent vc for hand placement.  Gait Training:  Patient ambulated 100' x1 over uneven outdoor surfaces using [facility] and supervision with w/c follow for balance/ fatigue. Pt also demonstrates good control with ambulation down 85' ramp demonstrating slow pace for controlling speed. Completes with supervision/ CGA for balance. On reaching bottom of ramp, pt is able to complete 180deg turn and immediately initiate ascending ramp. With consistent pace, he demos good step height bilaterally and reaches top of ramp for therapeutic seated rest break.   Demonstrates care when ambulating over transition in surfaces from outdoor to indoor. Completes >400 feet with SBQC and supervision with close w/c follow from front entrance and into elevators on Central wing. Brought back to room for seated rest break prior to returning to bed. Then deciding to sit up in w/c.  Patient seated  upright in w/c at end of session with brakes locked, belt alarm set, and all needs within reach. Wife and brother present.    Therapy Documentation Precautions:  Precautions Precautions:  Fall Precaution Comments: Lt inattention, Lt hemiparesis UE>LE Restrictions Weight Bearing Restrictions: No   Therapy/Group: Individual Therapy  Alger Simons PT, DPT 05/24/2020, 10:17 AM

## 2020-05-24 NOTE — Progress Notes (Signed)
Occupational Therapy Session Note  Patient Details  Name: Thomas Sampson MRN: [id number] Date of Birth: Jun 07, 1945  Today's Date: 05/24/2020 OT Individual Time: 5465-6812 OT Individual Time Calculation (min): 65 min    Short Term Goals: Week 2:  OT Short Term Goal 1 (Week 2): STG = LTG 2/2 ELOS  Skilled Therapeutic Interventions/Progress Updates:    Treatment session with focus on dynamic standing balance and LUE NMR.  Pt received upright in w/c with givmohr sling still on.  Educated pt on wear of givmohr sling with standing and ambulation and does not need to wear it at all times.  Engaged in [place] with LUE with focus on increased sustained mobility.  Pt demonstrating ability to reach 90* shoulder flexion but with difficulty maintaining against gravity.  Progressed to functional reach with LUE to erase markings on mirror with LUE.  Therapist providing hand over hand assist to maintain grasp on wash cloth and then to facilitate increased reach.  Engaged in towel glides on table with focus on increased shoulder flexion and extension.  Therapist applied e-stim to forearm to activate wrist and finger extension. Pt demonstrating increased finger extension with stimulation.  Minimal carryover with stimulation removed, however pt reports increased finger flexion/grasp.  Educated on [place] with grasp and release with use of RUE to facilitate full finger extension.  Pt returned to room and remained upright in w/c with seat belt alarm on and all needs in reach.  Ratio 1:3 Rate 35 pps Waveform- Asymmetric Ramp 1.0 Pulse 300 Intensity- 27 Duration -   8 min  Report of pain at the beginning of session - 0 Report of pain at the end of session - 0  No adverse reactions after treatment and is skin intact.    Therapy Documentation Precautions:  Precautions Precautions: Fall Precaution Comments: Lt inattention, Lt hemiparesis UE>LE Restrictions Weight Bearing Restrictions: No General:   Vital  Signs: Therapy Vitals Temp: 98.6 F (37 C) Temp Source: Oral Pulse Rate: 67 Resp: 18 BP: 133/66 Patient Position (if appropriate): Sitting Oxygen Therapy SpO2: 95 % O2 Device: Room Air Pain: Pain Assessment Pain Score: 0-No pain   Therapy/Group: Individual Therapy  Simonne Come 05/24/2020, 3:24 PM

## 2020-05-24 NOTE — Progress Notes (Signed)
Patient ID: Thomas Sampson, male   DOB: Mar 18, 1945, 75 y.o.   MRN: [id number]  Pt referral faxed to [facility] [place], [place]

## 2020-05-25 ENCOUNTER — Inpatient Hospital Stay (HOSPITAL_COMMUNITY): Payer: Medicare Other

## 2020-05-25 LAB — GLUCOSE, CAPILLARY
Glucose-Capillary: 137 mg/dL — ABNORMAL HIGH (ref 70–99)
Glucose-Capillary: 214 mg/dL — ABNORMAL HIGH (ref 70–99)
Glucose-Capillary: 130 mg/dL — ABNORMAL HIGH (ref 70–99)
Glucose-Capillary: 128 mg/dL — ABNORMAL HIGH (ref 70–99)

## 2020-05-25 NOTE — Progress Notes (Signed)
PROGRESS NOTE   Subjective/Complaints:  Had a good session with therapy  ROS: Patient denies fever, CP, SOB, N/V/D  .  Objective:   No results found. No results for input(s): WBC, HGB, HCT, PLT in the last 72 hours. No results for input(s): NA, K, CL, CO2, GLUCOSE, BUN, CREATININE, CALCIUM in the last 72 hours.  Intake/Output Summary (Last 24 hours) at 05/25/2020 0721 Last data filed at 05/25/2020 0659 Gross per 24 hour  Intake 1680 ml  Output --  Net 1680 ml        Physical Exam: Vital Signs Blood pressure 134/79, pulse 78, temperature 98.5 F (36.9 C), temperature source Oral, resp. rate 18, weight 99.4 kg, SpO2 95 %.   General: No acute distress Mood and affect are appropriate Heart: Regular rate and rhythm no rubs murmurs or extra sounds Lungs: Clear to auscultation, breathing unlabored, no rales or wheezes Abdomen: Positive bowel sounds, soft nontender to palpation, nondistended Extremities: No clubbing, cyanosis, or edema Skin: No evidence of breakdown, no evidence of rash  Musculoskeletal:  Comments: sternal pain with palpation and deep breaths.    Skin: General: Skin is warmand dry.  Neurological:  Mental Status: He is alert.  Comments: Patient is alert. No acute distress.  Speech is dysarthric but intelligible.  DTR's 2+ LUE and LLE and 1+ on right. LUE: 2/5 delt, pec, biceps and 1/5 hand and wrist . LLE:  3+/5 HF, KE and ADF/PF. Psychiatric:  Commentsno lability    Assessment/Plan: 1. Functional deficits which require 3+ hours per day of interdisciplinary therapy in a comprehensive inpatient rehab setting.  Physiatrist is providing close team supervision and 24 hour management of active medical problems listed below.  Physiatrist and rehab team continue to assess barriers to discharge/monitor patient progress toward functional and medical goals  Care Tool:  Bathing     Body parts bathed by patient: Chest,Abdomen,Left arm,Face,Front perineal area,Buttocks,Right upper leg,Left upper leg,Right lower leg,Left lower leg   Body parts bathed by helper: Right arm     Bathing assist Assist Level: Minimal Assistance - Patient > 75%     Upper Body Dressing/Undressing Upper body dressing   What is the patient wearing?: Pull over shirt    Upper body assist Assist Level: Minimal Assistance - Patient > 75%    Lower Body Dressing/Undressing Lower body dressing      What is the patient wearing?: Underwear/pull up     Lower body assist Assist for lower body dressing: Minimal Assistance - Patient > 75%     Toileting Toileting    Toileting assist Assist for toileting: Minimal Assistance - Patient > 75%     Transfers Chair/bed transfer  Transfers assist     Chair/bed transfer assist level: Contact Guard/Touching assist     Locomotion Ambulation   Ambulation assist      Assist level: Contact Guard/Touching assist Assistive device: Cane-quad Max distance: 150   Walk 10 feet activity   Assist     Assist level: Contact Guard/Touching assist Assistive device: Cane-quad   Walk 50 feet activity   Assist    Assist level: Contact Guard/Touching assist Assistive device: Cane-quad    Walk 150 feet  activity   Assist Walk 150 feet activity did not occur: Safety/medical concerns  Assist level: Contact Guard/Touching assist Assistive device: Cane-quad    Walk 10 feet on uneven surface  activity   Assist     Assist level: Minimal Assistance - Patient > 75% Assistive device: Tree surgeon Will patient use wheelchair at discharge?: Yes Type of Wheelchair: Manual    Wheelchair assist level: Minimal Assistance - Patient > 75% Max wheelchair distance: 150    Wheelchair 50 feet with 2 turns activity    Assist        Assist Level: Minimal Assistance - Patient > 75%   Wheelchair 150 feet  activity     Assist      Assist Level: Minimal Assistance - Patient > 75%   Blood pressure 134/79, pulse 78, temperature 98.5 F (36.9 C), temperature source Oral, resp. rate 18, weight 99.4 kg, SpO2 95 %.  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  Impaired mobility:  -DVT/anticoagulation:SCDs -antiplatelet therapy: continue Aspirin and Plavix 75 mg daily x3 months then Plavix alone 3. Pain Management:Denies neuropathic pain: decrease Lyrica to 25 mg twice daily. Add lidocaine patch for right shoulder.  4. Mood:Provide emotional support -antipsychotic agents: N/A -added nuedexta for PBA with good results 5. Neuropsych: This patientiscapable of making decisions on hisown behalf. 6. Skin/Wound Care:Routine skin checks 7. Fluids/Electrolytes/Nutrition:Routine in and outs with follow-up may d/c IV 8. Dysphagia. Dysphagia #1 nectar liquids. Follow-up speech therapy 9. CKD stage IV. Admission BUN 2.26. Follow-up chemistries- at baseline 10. Diabetes mellitus with peripheral neuropathy. Hemoglobin A1c 8.1.  -NovoLog 8 units 3 times daily -increase Lantus to 43 U -Check blood sugars before meals and at bedtime  4/17: CBGs 211-376: Cr elevated. Increase Lantus to 44U CBG (last 3)  Recent Labs    05/24/20 1651 05/24/20 2156 05/25/20 0504  GLUCAP 162* 176* 137*  fair control on lantus 50U with novolog 11. Hypertension. Well controlled, continue Norvasc 2.5 mg daily. Monitor with increased mobility on rehab Vitals:   05/24/20 1938 05/25/20 0450   BP: 140/67 134/79  Pulse: 83 78  Resp: 17 18  Temp: 98.7 F (37.1 C) 98.5 F (36.9 C)  SpO2: 98% 95%  controlled 4/26 12. Hyperlipidemia. Lipitor 13.Tobacco abuse.Counseling 14. Obesity BMI 32.36: provide counseling 15.  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 Also with costochondritis   -ice, topical remedies including voltaren gel  LOS: 11 days A FACE TO FACE EVALUATION WAS PERFORMED  Charlett Blake 05/25/2020, 7:21 AM

## 2020-05-25 NOTE — Progress Notes (Signed)
Patient ID: Thomas Sampson, male   DOB: [birth year], 75 y.o.   MRN: [id number]   SW received call from head of referrals at [facility], agency report they can now see patient for PT. Family requesting orders to be sent to agency.  [place], [place]

## 2020-05-25 NOTE — Progress Notes (Signed)
Physical Therapy Session Note  Patient Details  Name: Thomas Sampson MRN: [id number] Date of Birth: 03-14-1945  Today's Date: 05/25/2020 PT Individual Time: 1448-1856 and 1445-1544 PT Individual Time Calculation (min): 32 min and 59 min   Short Term Goals: Week 2:  PT Short Term Goal 1 (Week 2): STG=LTG due to ELOS  Skilled Therapeutic Interventions/Progress Updates:  Session 1: Patient seated in w/c on entrance to room. Wife and sister in law present. Patient alert and agreeable to PT session. Patient denied pain during session.  Wife relates that new Giv-Mohr sling is present on bed but was there on arrival and did not receive from anyone.   Pt also relates that he performed well on this morning's swallow test and is now allowed to drink thin liquids and is on a regular diet.   Therapeutic Activity: Transfers: Patient performed STS and SPVT transfers during session with focus on no UE use. Provided int verbal cues for technique in forward lean and anterior push to decrease BLE extensor push into w/c.   Gait Training:  Patient ambulated 200 feet using SBQC with close supervision for balance/ fatigue. Demonstrated good step height during session and Giv-Mohr sling adjusted for improved fit in standing. Provided vc/ tc for upright posture and level gaze. On return trip to room, pt stops at bar height counter for provision of ice water. Maintains good balance and stamina throughout.   Patient seated in w/c at end of session withGiv-mohr sling doffed, brakes locked, belt alarm set, and all needs within reach.  Session 2: Patient seated upright in w/c on entrance to room. Patient alert and agreeable to PT session. Patient denied pain during session. Relates that he ate a cheeseburger for lunch and did well with no current signs of pocketing. Wife relates that she was supervising and noted no difficulties with chewing/ swallowing or pocketing of burger. Also states that his arm movement  continues to improve and demonstrates increased ability to mobilize fingers and thumb of LUE. Requests to work on tasks involving UE use.  Therapeutic Activity: Transfers: Patient performed STS and SPVT transfers throughout session with supervision and intermittent need for 2nd attempt to complete.  Provided verbal cues for positioning/ technique.  Gait Training:  Patient ambulated 175 feet using HW with supervision/ CGA for balance in quick turns. Is able to ambulate to and into elevator, locates and pushes correct floor button. Requires seated rest break during elevator ride. Provided vc/ tc for attempt to increase pt's level of independence.   Neuromuscular Re-ed: NMR facilitated during session with focus on standing balance and coordinated movements of LUE during functional tasks. Pt guided in standing placement and removal of horseshoes from low height basketball goal. With vc for adjusting arm movements and finger grasp, pt is able to grasp horseshoe from R hand with Mod A for maintaining grasp, and intermittent Min A for elbow movement. Pt's shoulder movement demos increased coordination of required movement than elbow/ wrist/ fingers. Completes 7 horseshoes on and then off with seate rest break between bouts. With fatigue requires increase level of assist to complete.   Pt also guided in ambulation to 6 cones placed around therapy gym at different heights from waist to head height. Assisted in L hand placement and vc for grasp with pt able to grasp 5/ 6 cones with minimal assist. NMR performed for improvements in motor control and coordination, balance, sequencing, judgement, and self confidence/ efficacy in performing all aspects of mobility at highest level of  independence.   Patient seated upright in w/c at end of session with brakes locked, belt alarm set, and all needs within reach. Pt to call for assist when ready to return to bed.    Therapy Documentation Precautions:   Precautions Precautions: Fall Precaution Comments: Lt inattention, Lt hemiparesis UE>LE Restrictions Weight Bearing Restrictions: No   Therapy/Group: Individual Therapy  Alger Simons PT, DPT 05/25/2020, 7:20 PM

## 2020-05-25 NOTE — Progress Notes (Signed)
Patient ID: Thomas Sampson, male   DOB: 1945-07-25, 75 y.o.   MRN: [id number]   [facility]land Falling and [facility] ordered through [employer].   [place], [place]

## 2020-05-25 NOTE — Consult Note (Signed)
Neuropsychological Consultation   Patient:   Thomas Sampson   DOB:   1945-09-02  MR Number:  [id number]  Location:  [place] [id number] [address] [place] [address] Dept: [place]: [phone]           Date of Service:   05/25/2020  Start Time:   1 PM End Time:   2 PM  Provider/Observer:  Ilean Skill, Psy.D.       Clinical Neuropsychologist       Billing Code/Service: [phone]  Chief Complaint:    Thomas Sampson is a 75 year old male with history of chronic kidney disease stage IV, diabetes, hypertension, hyperlipidemia and tobacco abuse.  Patient presented on 05/10/2020 to [facility] with left-sided weakness and slurred speech with acute onset.  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  MRI as well as CT angiogram of head and neck showed occlusion of right M3 branch superior division compatible with embolus.  Admission chemistries showed elevated blood glucose 207 and hemoglobin  A1c 8.1.  Patient was referred for inpatient comprehensive rehabilitation program due to ongoing weakness, primary left-sided with primary weakness with left arm and slurred speech.  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  Below see HPI for the current admission.  Thomas Sampson is a 75 year old right-handed male with history of chronic kidney disease stage IV, diabetes mellitus, hypertension, hyperlipidemia and tobacco abuse. Per chart review patient lives with spouse independent prior to admission. 1 level home with ramped entrance. Presented 05/10/2020 to [facility] with left-sided weakness and slurred speech of acute onset. CT/MRI showed acute infarction right insula and parietal  operculum. 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 No significant carotid or vertebral artery stenosis of the neck. Admission chemistries unremarkable except glucose 207, BUN 31, creatinine 2.26 hemoglobin A1c 8.1. Echocardiogram with ejection fraction of 70 to 75% no wall motion abnormalities. 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 Lower extremity Dopplers negative for DVT. Initially maintained on Cleviprex for blood pressure control. Neurology follow-up currently maintained on aspirin 81 mg daily plus Plavix 75 mg daily for 3 months then Plavix alone. Awaiting loop recorder placement today..Pt on dysphagia #1 nectar thick liquid diet. Therapy evaluations completed due to patient's left-sided weakness and slurred speech. He was evaluated by the rehab team who felt that he would benefit from acomprehensive rehab program.   Current Status:  Patient was awake and alert sitting up in his chair when I entered the room with his sister and wife present in the room.  Patient reports that he has been doing well and is looking forward to discharge on Friday.  Patient reports that he continues to have significant motor deficits for left arm but there is been significant improvements for his left leg allowing for significant improvements in motor functioning for his left leg.  Patient reports that initially after stroke there was a lot of mood instability and that he has responded quite well to the "new medication" (Nuedexta).  Patient reports that mood has been quite stable once the medication started taking effect.  There were no indications of pseudobulbar affect during my visit today but the  patient is continuing to take Nuedexta and responding well.  Whether or not this medication will be needed going forward will need to be determined later.   Patient's cognition and mental status appear to be good and he was well oriented and appears that higher cognitive functioning remain intact.  Behavioral Observation: Thomas Sampson  presents as a 75 y.o.-year-old Right Caucasian Male who appeared his stated age. his dress was Appropriate and he was Well Groomed and his manners were Appropriate to the situation.  his participation was indicative of Appropriate and Attentive behaviors.  There were physical disabilities noted.  he displayed an appropriate level of cooperation and motivation.     Interactions:    Active Appropriate and Attentive  Attention:   within normal limits and attention span and concentration were age appropriate  Memory:   within normal limits; recent and remote memory intact  Visuo-spatial:  not examined  Speech (Volume):  normal  Speech:   normal; normal  Thought Process:  Coherent and Relevant  Though Content:  WNL; not suicidal and not homicidal  Orientation:   person, place, time/date and situation  Judgment:   Good  Planning:   Fair  Affect:    Appropriate  Mood:    Euthymic  Insight:   Good  Intelligence:   normal  Medical History:   Past Medical History:  Diagnosis Date  . Chronic kidney disease    kidney stones  . Diabetes mellitus without complication ([place])   . Hyperlipidemia   . Hypertension   . Tinea pedis          Patient Active Problem List   Diagnosis Date Noted  . 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 Stroke ([place]) 05/10/2020  . CKD (chronic kidney disease), stage IV ([place]) 05/10/2020  . Fall 05/10/2020  . GERD (gastroesophageal reflux disease) 05/10/2020  . Middle cerebral artery embolism, right 05/10/2020  . Stroke (cerebrum) ([place])   . Chronic respiratory failure with hypoxia ([place])   . Atelectasis   . Hyperlipidemia 10/15/2018  . Type II diabetes mellitus with renal manifestations ([place]) 09/21/2017  . Hypertension 09/21/2017  . CKD (chronic kidney  disease) stage 3, GFR 30-59 ml/min ([facility]) 09/21/2017  . Claudication ([place]) 09/21/2017  . Left flank pain 11/08/2016  . Porokeratosis 07/23/2015  . Bursitis of hip, right 01/15/2015    Psychiatric History:  No prior psychiatric history  Family Med/Psych History:  Family History  Problem Relation Age of Onset  . Cancer Mother   . Heart attack Father   . Heart attack Sister   . Heart disease Sister   . Diabetes Brother    Impression/DX:  Thomas Sampson is a 75 year old male with history of chronic kidney disease stage IV, diabetes, hypertension, hyperlipidemia and tobacco abuse.  Patient presented on 05/10/2020 to [facility] with left-sided weakness and slurred speech with acute onset.  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  MRI as well as CT angiogram of head and neck showed occlusion of right M3 branch superior division compatible with embolus.  Admission chemistries showed elevated blood glucose 207 and hemoglobin  A1c 8.1.  Patient was referred for inpatient comprehensive rehabilitation program due to ongoing weakness, primary left-sided with primary weakness with left arm and slurred speech.  Patient was awake and alert sitting up in his chair when I entered the room with his sister and wife present in the room.  Patient reports that he has been doing well  and is looking forward to discharge on Friday.  Patient reports that he continues to have significant motor deficits for left arm but there is been significant improvements for his left leg allowing for significant improvements in motor functioning for his left leg.  Patient reports that initially after stroke there was a lot of mood instability and that he has responded quite well to the "new medication" (Nuedexta).  Patient reports that mood has been quite stable once the medication started taking effect.  There were no indications of pseudobulbar affect during my visit today but the  patient is continuing to take Nuedexta and responding well.  Whether or not this medication will be needed going forward will need to be determined later.  Patient's cognition and mental status appear to be good and he was well oriented and appears that higher cognitive functioning remain intact.  Disposition/Plan:  Today we worked on coping and adjustment issues with extended hospital stay and recent MCA stroke (right).  Diagnosis:    Right middle cerebral artery stroke [facility]) - Plan: Ambulatory referral to Neurology  Right shoulder pain - Plan: DG Shoulder Right, DG Shoulder Right         Electronically Signed   _______________________ Ilean Skill, Psy.D. Clinical Neuropsychologist

## 2020-05-25 NOTE — Progress Notes (Signed)
Orthopedic Tech Progress Note Patient Details:  Thomas Sampson Jul 23, 1945 [id number] Called in order to [facility]  For a [facility]  Patient ID: Thomas Sampson, male   DOB: 03-03-1945, 75 y.o.   MRN: [id number]   [name] 05/25/2020, 8:21 AM

## 2020-05-25 NOTE — Progress Notes (Signed)
Patient ID: Thomas Sampson, male   DOB: 1945-09-24, 75 y.o.   MRN: [id number]   Pt accepted by [facility] for [facility]. Pt [facility] Saturday, 4/30  [place], [place]

## 2020-05-25 NOTE — Progress Notes (Signed)
Occupational Therapy Session Note  Patient Details  Name: Thomas Sampson MRN: [id number] Date of Birth: 1945-04-15  Today's Date: 05/25/2020 OT Individual Time: 0701-0806 OT Individual Time Calculation (min): 65 min    Short Term Goals: Week 2:  OT Short Term Goal 1 (Week 2): STG = LTG 2/2 ELOS  Skilled Therapeutic Interventions/Progress Updates:    Pt received in bathroom, denies pain throughout session, agreeable to therapy. Cont void of b/b, mod I seated pericare. Ambulator toilet transfer and LB clothing with close S. Amb to sink with CGA and stood to wash hands and compete oral care close S, good incorporation of LUE as stabilizer. Donned L TEDs total A, donned B shoes with min A to adjust over heels. Transported to and from gym in w/c 2/2 time management to focus on LUE NMR/WB in prep for improved bimanual ADL performance. Completed massed practice of placing and retrieving clothes pins to faciliate LUE WB. 1:1 NMES applied to L digit extensors. Overall min A to facilitate functional grasp and release on object during duration of estim. Noted improvement in active grasp this date.  Ratio 1:3 Rate 35 pps Waveform- Asymmetric Ramp 1.0 Pulse 753 Intensity- 35 clicks Duration -   7 min  No adverse reactions after treatment and is skin intact.  Denies pain.   Pt left in w/c with safety belt alarm engaged, call bell in reach, and all immediate needs met.    Therapy Documentation Precautions:  Precautions Precautions: Fall Precaution Comments: Lt inattention, Lt hemiparesis UE>LE Restrictions Weight Bearing Restrictions: No Pain:   denies ADL: See Care Tool for more details.   Therapy/Group: Individual Therapy  Volanda Napoleon MS, OTR/L  05/25/2020, 8:31 AM

## 2020-05-25 NOTE — Progress Notes (Signed)
Modified Barium Swallow Progress Note  Patient Details  Name: Thomas Sampson MRN: [id number] Date of Birth: [birth year]  Today's Date: 05/25/2020  Modified Barium Swallow completed.  Full report located under Chart Review in the Imaging Section.  Brief recommendations include the following:  Clinical Impression  Pt demonstrated dramatic improvement in oropharyngeal swallow function compared to previous instrumental swallow assessment on 05/12/2020. Oral phase was characterized by appropriate bolus containment, bolus cohesion and effective AP transport. Pharyngeal function was characterized by timely swallow initiation and effective airway protection. Swallow was initiated at the pyriform sinuses with thin liquids. Pt was challenged with consecutive sips of thin liquids via straw, consuming barium tablet with consecutive sips of thin liquids and mixed consistencies, in which swallow function continued to remain intact. No penetration or aspiration noted. Pt did demonstrate slight increase in mastication time with limited regular texture trials. Therefore, pt would benefit from 1-2 follow up ST sessions on upgraded diet, suggest over a meal to ensure appropriate oral clearance prior to d/c from dysphagia services.  Prior to this study pt demonstrated mild difficulty with left anterior containment and left buccal cavity pocketing on current diet of dys 1 textures during meals. SLP recommends a diet of regular textures and thin liquids, medication whole with thin liquids and brief intermittent supervision to observe upgraded diet tolerance.    Swallow Evaluation Recommendations       SLP Diet Recommendations: Thin liquid;Regular solids   Liquid Administration via: Straw;Cup   Medication Administration: Whole meds with liquid   Supervision: Patient able to self feed;Intermittent supervision to cue for compensatory strategies   Compensations: Minimize environmental distractions;Slow rate;Small  sips/bites;Lingual sweep for clearance of pocketing   Postural Changes: Seated upright at 90 degrees   Oral Care Recommendations: Oral care BID        Hailey Miles  [facility] 05/25/2020,3:42 PM

## 2020-05-25 NOTE — Progress Notes (Signed)
Patient has a new skin tear on left posterior hand. RN clean area and applied pressure dressing. Patient denies pain. Will continue to assess and evaluate.

## 2020-05-26 LAB — GLUCOSE, CAPILLARY
Glucose-Capillary: 125 mg/dL — ABNORMAL HIGH (ref 70–99)
Glucose-Capillary: 136 mg/dL — ABNORMAL HIGH (ref 70–99)
Glucose-Capillary: 79 mg/dL (ref 70–99)
Glucose-Capillary: 146 mg/dL — ABNORMAL HIGH (ref 70–99)

## 2020-05-26 MED ORDER — PANTOPRAZOLE SODIUM 40 MG PO TBEC
40.0000 mg | DELAYED_RELEASE_TABLET | Freq: Every day | ORAL | Status: DC
  Administered 2020-05-26 – 2020-05-28 (×3): 40 mg via ORAL
  Filled 2020-05-26 (×3): qty 1

## 2020-05-26 NOTE — Discharge Summary (Signed)
Physician Discharge Summary  Patient ID: REFORD Sampson MRN: [id number] DOB/AGE: 1945-03-10 75 y.o.  Admit date: 05/14/2020 Discharge date: 05/28/2020  Discharge Diagnoses:  Principal Problem:   Right middle cerebral artery stroke [facility]) Pain management CKD stage IV Dysphagia Diabetes mellitus Hypertension Hyperlipidemia Tobacco abuse Obesity  Discharged Condition: Stable  Significant Diagnostic Studies: DG Shoulder Right  Result Date: 05/17/2020 CLINICAL DATA:  Right shoulder pain. EXAM: RIGHT SHOULDER - 2+ VIEW COMPARISON:  None. FINDINGS: There is no evidence of fracture or dislocation. Moderate degenerative changes seen involving the right acromioclavicular joint. Soft tissues are unremarkable. 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 No acute abnormality seen in the right shoulder. Electronically Signed   By: Marijo Conception M.D.   On: 05/17/2020 09:30   CT Head Wo Contrast  Result Date: 05/11/2020 CLINICAL DATA:  75 year old male status post code stroke presentation yesterday with right M3 occlusion status post endovascular revascularization. Contrast staining, subarachnoid contrast/blood intraoperatively. Subsequent encounter. EXAM: CT HEAD WITHOUT CONTRAST TECHNIQUE: Contiguous axial images were obtained from the base of the skull through the vertex without intravenous contrast. COMPARISON:  NIR flat panel head CT 1758 hours on 05/10/2020. Presentation plain head CT 0942 hours 05/10/2020. FINDINGS: Brain: Small volume subarachnoid hemorrhage along the right sylvian fissure (series 3, image 18), new from the presentation CT yesterday but regressed compared to 1758 hours yesterday. No associated intraventricular hemorrhage or ventriculomegaly. Normal basilar cisterns. No midline shift. Roughly 4 cm area of cytotoxic edema is evident now at the right frontal operculum on series 3, image 21. No intra-axial hemorrhage identified. Stable gray-white matter  differentiation elsewhere. Vascular: Some residual intravascular contrast is suspected. Skull: No acute osseous abnormality identified. Sinuses/Orbits: Visualized paranasal sinuses and mastoids are stable and well aerated. Other: Endotracheal tube partially visible in the oral cavity. No acute orbit or scalp soft tissue finding. IMPRESSION: 1. Small volume subarachnoid hemorrhage along the right Sylvian fissure is regressed compared to 1758 hours yesterday. 2. Cytotoxic edema now visible at the right frontal operculum. But no malignant hemorrhagic transformation. No significant intracranial mass effect. 3. Preliminary report of the above discussed by telephone with Dr. Quinn Axe on 05/11/2020 at 0552 hours. Electronically Signed   By: Genevie Ann M.D.   On: 05/11/2020 06:05   CT HEAD WO CONTRAST  Result Date: 05/10/2020 CLINICAL DATA:  Discoordination and slurred speech with fall EXAM: CT HEAD WITHOUT CONTRAST TECHNIQUE: Contiguous axial images were obtained from the base of the skull through the vertex without intravenous contrast. COMPARISON:  None. FINDINGS: Brain: There is mild diffuse atrophy. There is no appreciable intracranial mass, hemorrhage, extra-axial fluid collection, or midline shift. The brain parenchyma appears unremarkable. No acute infarct is demonstrable on this study. Vascular: There is no hyperdense vessel. There is slight calcification in the carotid siphon regions bilaterally. Skull: Bony calvarium appears intact. Sinuses/Orbits: There is mucosal thickening in the medial superior left maxillary antrum. There is slight mucosal thickening in several ethmoid air cells. Orbits appear symmetric bilaterally. Other: Visualized mastoid air cells clear. IMPRESSION: Mild atrophy. Normal appearing brain parenchyma. No acute infarct demonstrated by CT. No mass or hemorrhage. Slight arterial vascular calcification noted. Areas of paranasal sinus mucosal thickening noted. Electronically Signed   By: Lowella Grip III M.D.   On: 05/10/2020 10:07   MR ANGIO HEAD WO CONTRAST  Result Date: 05/11/2020 CLINICAL DATA:  Stroke, follow-up. EXAM: MRI HEAD WITHOUT CONTRAST MRA HEAD WITHOUT CONTRAST TECHNIQUE: Multiplanar, multiecho pulse sequences of the brain and surrounding  structures were obtained without intravenous contrast. Angiographic images of the head were obtained using MRA technique without contrast. COMPARISON:  Head CT 05/11/2020. Images from thrombectomy 05/10/2020. Noncontrast head CT, CT angiogram head/neck and CT perfusion 05/10/2020. MRI/MRA head 05/10/2020. Small chronic infarct within the right cerebellar hemisphere. FINDINGS: MRI HEAD FINDINGS Brain: Mild-to-moderate cerebral atrophy. Comparatively mild cerebellar atrophy. Acute cortical/subcortical infarct within the right MCA vascular territory. The dominant component of the infarct is present within the right frontal operculum and right insula and now measures 4.7 x 3.9 x 4.2 cm. However, there are additional cortical/subcortical acute infarcts more superiorly and posteriorly within the right frontal lobe and right parietal lobe (with involvement of the pre and postcentral gyri). Additional small acute infarcts are also present within the right caudate nucleus and right frontal lobe more anteriorly. SWI signal loss along the right sylvian fissure, likely reflecting a combination of petechial hemorrhage and the previously demonstrated small-volume acute subarachnoid hemorrhage at this site. Additional petechial hemorrhage within the right caudate nucleus infarct. Additional petechial hemorrhage versus chronic microhemorrhages within the right frontal operculum and right frontal lobe white matter. There is no significant mass effect at this time. No midline shift. Small acute cortically based infarct within the right occipital lobe (PCA vascular territory). Punctate acute infarct within the left occipital lobe (PCA vascular territory) (series 5,  image 70). Background mild multifocal T2/FLAIR hyperintensity within the cerebral white matter is nonspecific, but compatible with chronic small vessel ischemic disease. Redemonstrated small chronic infarct within the right cerebellar hemisphere. No evidence of intracranial mass. No extra-axial fluid collection. Vascular: Expected proximal arterial flow voids. Skull and upper cervical spine: No focal marrow lesion. Sinuses/Orbits: Visualized orbits show no acute finding. Trace scattered paranasal sinus mucosal thickening at the imaged levels. MRA HEAD FINDINGS: The intracranial internal carotid arteries are patent. The M1 middle cerebral arteries are patent. High-grade focal stenosis with possible short-segment flow gap at site of previously demonstrated mid to distal right M2 MCA branch occlusion (series 10, images 125-134) (series 1081, image 3). The anterior cerebral arteries are patent. The intracranial vertebral arteries are patent. The basilar artery is patent. The posterior cerebral arteries are patent. A right posterior communicating artery is present. The left posterior communicating artery is hypoplastic or absent. No intracranial aneurysm is identified. IMPRESSION: MRI brain: 1. Acute right MCA vascular territory infarct as described. This has significantly increased in extent as compared to the brain MRI of 05/10/2020, and may have subtly increased in extent as compared to the head CT performed earlier today. No significant mass effect at this time 2. SWI signal loss along the right sylvian fissure which likely reflects a combination of petechial hemorrhage and the small-volume acute subarachnoid hemorrhage previously demonstrated at this site. 3. Small acute cortically-based infarct within the right occipital lobe (PCA vascular territory). 4. Punctate acute cortical infarct within the left occipital lobe (PCA vascular territory). 5. Background chronic findings without interval change, as described. MRA  head: 1. High-grade focal stenosis with possible short-segment flow gap at site of previously demonstrated mid-to-distal right M2 MCA branch occlusion. 2. Elsewhere, no intracranial large vessel occlusion or proximal high-grade arterial stenosis is identified. Electronically Signed   By: Kellie Simmering DO   On: 05/11/2020 18:10   MR ANGIO HEAD WO CONTRAST  Result Date: 05/10/2020 CLINICAL DATA:  TIA. Dysarthria and slurred speech. Mildly weak on the left. EXAM: MRI HEAD WITHOUT CONTRAST MRA HEAD WITHOUT CONTRAST TECHNIQUE: Multiplanar, multiecho pulse sequences of the brain and surrounding structures  were obtained without intravenous contrast. Angiographic images of the head were obtained using MRA technique without contrast. COMPARISON:  CT head 05/10/2020 FINDINGS: MRI HEAD FINDINGS Brain: Acute infarct in the right insular cortex and right parietal operculum. There is FLAIR hyperintensity in the right MCA branches in this area due to slow flow or thrombosis. There is susceptibility in the right sylvian fissure compatible with thrombus in the right MCA branch. Mild atrophy. Mild chronic microvascular ischemic change in the white matter. 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 Negative for mass lesion. Vascular: Normal arterial flow voids at the base of brain. Susceptibility in the right sylvian fissure with FLAIR hyperintensity in the right MCA vessels compatible with slow flow or occlusion Skull and upper cervical spine: Negative Sinuses/Orbits: Mild mucosal edema paranasal sinuses. Bilateral cataract extraction Other: None MRA HEAD FINDINGS Internal carotid artery widely patent bilaterally without stenosis. Anterior cerebral arteries widely patent with mild irregularity. Left middle cerebral artery widely patent with mild irregularity. Right M1 segment patent. Occlusion of the superior division of the right M3 vessel. Inferior division right M2 widely patent. Posterior circulation normal without  significant stenosis. Fetal origin right posterior cerebral artery. IMPRESSION: 1. 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 2. Mild chronic microvascular ischemic change in the white matter 3. 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   On: 05/10/2020 13:52   MR BRAIN WO CONTRAST  Result Date: 05/11/2020 CLINICAL DATA:  Stroke, follow-up. EXAM: MRI HEAD WITHOUT CONTRAST MRA HEAD WITHOUT CONTRAST TECHNIQUE: Multiplanar, multiecho pulse sequences of the brain and surrounding structures were obtained without intravenous contrast. Angiographic images of the head were obtained using MRA technique without contrast. COMPARISON:  Head CT 05/11/2020. Images from thrombectomy 05/10/2020. Noncontrast head CT, CT angiogram head/neck and CT perfusion 05/10/2020. MRI/MRA head 05/10/2020. Small chronic infarct within the right cerebellar hemisphere. FINDINGS: MRI HEAD FINDINGS Brain: Mild-to-moderate cerebral atrophy. Comparatively mild cerebellar atrophy. Acute cortical/subcortical infarct within the right MCA vascular territory. The dominant component of the infarct is present within the right frontal operculum and right insula and now measures 4.7 x 3.9 x 4.2 cm. However, there are additional cortical/subcortical acute infarcts more superiorly and posteriorly within the right frontal lobe and right parietal lobe (with involvement of the pre and postcentral gyri). Additional small acute infarcts are also present within the right caudate nucleus and right frontal lobe more anteriorly. SWI signal loss along the right sylvian fissure, likely reflecting a combination of petechial hemorrhage and the previously demonstrated small-volume acute subarachnoid hemorrhage at this site. Additional petechial hemorrhage within the right caudate nucleus infarct. Additional petechial hemorrhage versus chronic microhemorrhages within the right frontal operculum and right  frontal lobe white matter. There is no significant mass effect at this time. No midline shift. Small acute cortically based infarct within the right occipital lobe (PCA vascular territory). Punctate acute infarct within the left occipital lobe (PCA vascular territory) (series 5, image 70). Background mild multifocal T2/FLAIR hyperintensity within the cerebral white matter is nonspecific, but compatible with chronic small vessel ischemic disease. Redemonstrated small chronic infarct within the right cerebellar hemisphere. No evidence of intracranial mass. No extra-axial fluid collection. Vascular: Expected proximal arterial flow voids. Skull and upper cervical spine: No focal marrow lesion. Sinuses/Orbits: Visualized orbits show no acute finding. Trace scattered paranasal sinus mucosal thickening at the imaged levels. MRA HEAD FINDINGS: The intracranial internal carotid arteries are patent. The M1 middle cerebral arteries are patent. High-grade focal  stenosis with possible short-segment flow gap at site of previously demonstrated mid to distal right M2 MCA branch occlusion (series 10, images 125-134) (series 1081, image 3). The anterior cerebral arteries are patent. The intracranial vertebral arteries are patent. The basilar artery is patent. The posterior cerebral arteries are patent. A right posterior communicating artery is present. The left posterior communicating artery is hypoplastic or absent. No intracranial aneurysm is identified. IMPRESSION: MRI brain: 1. Acute right MCA vascular territory infarct as described. This has significantly increased in extent as compared to the brain MRI of 05/10/2020, and may have subtly increased in extent as compared to the head CT performed earlier today. No significant mass effect at this time 2. SWI signal loss along the right sylvian fissure which likely reflects a combination of petechial hemorrhage and the small-volume acute subarachnoid hemorrhage previously  demonstrated at this site. 3. Small acute cortically-based infarct within the right occipital lobe (PCA vascular territory). 4. Punctate acute cortical infarct within the left occipital lobe (PCA vascular territory). 5. Background chronic findings without interval change, as described. MRA head: 1. High-grade focal stenosis with possible short-segment flow gap at site of previously demonstrated mid-to-distal right M2 MCA branch occlusion. 2. Elsewhere, no intracranial large vessel occlusion or proximal high-grade arterial stenosis is identified. Electronically Signed   By: Kellie Simmering DO   On: 05/11/2020 18:10   MR BRAIN WO CONTRAST  Result Date: 05/10/2020 CLINICAL DATA:  TIA. Dysarthria and slurred speech. Mildly weak on the left. EXAM: MRI HEAD WITHOUT CONTRAST MRA HEAD WITHOUT CONTRAST TECHNIQUE: Multiplanar, multiecho pulse sequences of the brain and surrounding structures were obtained without intravenous contrast. Angiographic images of the head were obtained using MRA technique without contrast. COMPARISON:  CT head 05/10/2020 FINDINGS: MRI HEAD FINDINGS Brain: Acute infarct in the right insular cortex and right parietal operculum. There is FLAIR hyperintensity in the right MCA branches in this area due to slow flow or thrombosis. There is susceptibility in the right sylvian fissure compatible with thrombus in the right MCA branch. Mild atrophy. Mild chronic microvascular ischemic change in the white matter. 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 Negative for mass lesion. Vascular: Normal arterial flow voids at the base of brain. Susceptibility in the right sylvian fissure with FLAIR hyperintensity in the right MCA vessels compatible with slow flow or occlusion Skull and upper cervical spine: Negative Sinuses/Orbits: Mild mucosal edema paranasal sinuses. Bilateral cataract extraction Other: None MRA HEAD FINDINGS Internal carotid artery widely patent bilaterally without stenosis. Anterior  cerebral arteries widely patent with mild irregularity. Left middle cerebral artery widely patent with mild irregularity. Right M1 segment patent. Occlusion of the superior division of the right M3 vessel. Inferior division right M2 widely patent. Posterior circulation normal without significant stenosis. Fetal origin right posterior cerebral artery. IMPRESSION: 1. 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 2. Mild chronic microvascular ischemic change in the white matter 3. 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   On: 05/10/2020 13:52   EP PPM/ICD IMPLANT  Result Date: 05/14/2020 SURGEON:  Allegra Lai, MD   PREPROCEDURE DIAGNOSIS:  Cryptogenic Stroke   POSTPROCEDURE DIAGNOSIS:  Cryptogenic Stroke    PROCEDURES:  1. Implantable loop recorder implantation   INTRODUCTION:  GRAESON NOURI is a 74 y.o. male with a history of unexplained stroke who presents today for implantable loop implantation.  The patient has had a cryptogenic stroke.  Despite an extensive workup by neurology, no  reversible causes have been identified.  he has worn telemetry during which he did not have arrhythmias.  There is significant concern for possible atrial fibrillation as the cause for the patients stroke.  The patient therefore presents today for implantable loop implantation.   DESCRIPTION OF PROCEDURE:  Informed written consent was obtained, and the patient was brought to the electrophysiology lab in a fasting state.  The patient required no sedation for the procedure today.  Mapping over the patient's chest was performed by the EP lab staff to identify the area where electrograms were most prominent for ILR recording.  This area was found to be the left parasternal region over the 3rd-4th intercostal space. The patients left chest was therefore prepped and draped in the usual sterile fashion by the EP lab staff. The skin overlying the left parasternal region was  infiltrated with lidocaine for local analgesia.  A 0.5-cm incision was made over the left parasternal region over the 3rd intercostal space.  A subcutaneous ILR pocket was fashioned using a combination of sharp and blunt dissection.  A Medtronic Reveal Linq model [place] [id number] implantable loop recorder was then placed into the pocket  R waves were very prominent and measured 0.61m. EBL<1 ml.  Steri- Strips and a sterile dressing were then applied.  There were no early apparent complications.   CONCLUSIONS:  1. Successful implantation of a Medtronic Reveal LINQ implantable loop recorder for cryptogenic stroke  2. No early apparent complications.   IR CT Head Ltd  Result Date: 05/12/2020 INDICATION: New onset of right gaze deviation and left sided weakness arm greater than leg. 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 EXAM: 1. EMERGENT LARGE VESSEL OCCLUSION THROMBOLYSIS (anterior CIRCULATION) COMPARISON:  CT angiogram of head and neck of May 10, 2020. MEDICATIONS: Ancef 2 g IV antibiotic was administered within 1 hour of the procedure. ANESTHESIA/SEDATION: General anesthesia CONTRAST:  Isovue 300 approximately 100 mL. FLUOROSCOPY TIME:  Fluoroscopy Time: 79 minutes 24 seconds (2346 mGy). COMPLICATIONS: None immediate. TECHNIQUE: Following a full explanation of the procedure along with the potential associated complications, an informed witnessed consent was obtained. The risks of intracranial hemorrhage of 10%, worsening neurological deficit, ventilator dependency, death and inability to revascularize were all reviewed in detail with the patient's spouse. The patient was then put under general anesthesia by the Department of Anesthesiology at [facility] The right groin was prepped and draped in the usual sterile fashion. Thereafter using modified Seldinger technique, transfemoral access into the right common femoral artery was obtained without  difficulty. Over a 0.035 inch guidewire an 8 [place] 25 cm sheath was inserted. Through this, and also over a 0.035 inch guidewire a combination of a 5.5 French Simmons 2 catheter inside of the 087 balloon guide catheter was advanced over a 0.035 inch Roadrunner guidewire and positioned without difficulty into the right common carotid artery. The guidewire was removed. Good aspiration was obtained from the hub of the balloon guide catheter. FINDINGS: A gentle control arteriogram performed through the balloon guide catheter demonstrated no evidence of spasms, dissections or of intraluminal filling defects. The right common carotid arteriogram demonstrates wide patency of the right external carotid artery and its major branches. The right internal carotid artery at the bulb to the cranial skull base demonstrates wide patency. The petrous, the cavernous and supraclinoid segments are widely patent. The right middle cerebral artery and the right anterior cerebral artery are seen to opacify into the capillary and  venous phases. 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 Right posterior communicating artery is seen opacifying the right posterior cerebral artery distribution. PROCEDURE: Through the 087 balloon guide catheter in the right common carotid artery, a combination of an 055 136 Zoom aspiration catheter with an 035 Zoom aspiration catheter was advanced over a 0.014 inch standard Synchro micro guidewire with a J configuration to the right M1 segment. Using a torque device, access was obtained to just proximal to the occluded superior division M2 segment. Access to the distal M3 M4 inferior division branch was obtained just distal to the occluded site. However, advancement of the 035 Zoom aspiration catheter was met with significant resistance with change in configuration distally. The micro guidewire, and the 035 Zoom  aspiration catheter were removed. The 055 Zoom aspiration was advanced to be just distal to the occluded site of the superior division. Constant aspiration was then performed via a Penumbra aspiration device for approximately 2 minutes. Thereafter, the 055 Zoom aspiration was retrieved and removed. A control arteriogram performed through the balloon guide in the distal right ICA continued to demonstrate no change in the occluded superior division prominent branch. No clot was noted in the aspirate or within the 055 aspiration catheter. Another pass was then made again using the 055 aspiration catheter with a 162 021 microcatheter over a 0.014 inch standard Synchro micro guidewire. This combination was again advanced as described earlier and positioned at the site of the occlusion. The micro guidewire was replaced with an 016 Fathom micro guidewire. This advanced easily through the occluded superior division occluded branch into the M4 region. This was then followed by the microcatheter. The guidewire was removed. Good aspiration obtained from the hub of the microcatheter. A gentle control arteriogram performed through the microcatheter demonstrated safe position of the tip of the microcatheter which was then connected to continuous heparinized saline infusion. A 3 mm x 40 mm Solitaire X retrieval device was then advanced to the distal end of the microcatheter and deployed in the usual manner without difficulty. The microcatheter was retrieved more proximally into the right internal carotid artery. The 055 Zoom aspiration was then advanced abutting against the occluded superior division M2 segment. With proximal flow arrest in the right internal carotid artery, and continuous aspiration with a 20 mL syringe at the hub of the balloon guide catheter, and constant aspiration applied at the hub of the 055 Zoom aspiration catheter for approximately 2-1/2 minutes, the combination of the retrieval device, the microcatheter  and the Zoom 055 aspiration catheter were retrieved and removed. Thick sticky clot was seen entangled within the retrieval device, and also in the hub of the Tuohy Sinclair. A control arteriogram performed following reversal of flow arrest in the right internal carotid artery demonstrated now complete revascularization of 2 of the 3 main branches emanating from the superior division. However, there continued be a distal M3 occlusion of an anterior perisylvian branch leading on up to the hyper perfused subcortical frontal perisylvian region seen on the CT perfusion mapping studies. A TICI 2C revascularization was achieved. 200 mcg of nitroglycerin was then infused through the balloon guide catheter in the right internal carotid artery to obviate mild to moderate spasm at the proximal superior division of the right MCA. A final control arteriogram performed through the balloon guide in the right common carotid artery continued to demonstrate patency of the extracranial, and intracranial right ICA,  a TICI 2C revascularization of the right middle cerebral artery distribution. The right anterior cerebral artery and right posterior communicating artery demonstrate wide patency unchanged. The balloon guide was removed. An 8 French Angio-Seal closure device was applied for hemostasis at the right groin puncture site. Distal pulses were Dopplerable in both feet unchanged. A CT of the brain demonstrated focal area of hyperattenuation in the putamen on the right side associated with moderate hyperattenuation in the posterior right perisylvian subarachnoid space. No evidence of mass or midline shift was noted. The patient was left intubated for airway protection. He was then transferred to the neuro ICU for post embolization management. IMPRESSION: Status post endovascular near complete revascularization of occluded superior division of the right middle cerebral artery with 1 pass with contact aspiration, and 1 pass with contact  aspiration with a 3 mm x 40 mm Solitaire retrieval device achieving a TICI 2C revascularization. PLAN: Follow-up with the referring MD. Electronically Signed   By: Luanne Bras M.D.   On: 05/11/2020 10:58   CT CEREBRAL PERFUSION W CONTRAST  Addendum Date: 05/10/2020   ADDENDUM REPORT: 05/10/2020 15:20 ADDENDUM: These results were called by telephone at the time of interpretation on 05/10/2020 at 3:20 pm to provider [facility] , who verbally acknowledged these results. Electronically Signed   By: Franchot Gallo M.D.   On: 05/10/2020 15:20   Result Date: 05/10/2020 CLINICAL DATA:  Stroke. EXAM: CT ANGIOGRAPHY HEAD AND NECK CT PERFUSION BRAIN TECHNIQUE: Multidetector CT imaging of the head and neck was performed using the standard protocol during bolus administration of intravenous contrast. Multiplanar CT image reconstructions and MIPs were obtained to evaluate the vascular anatomy. Carotid stenosis measurements (when applicable) are obtained utilizing NASCET criteria, using the distal internal carotid diameter as the denominator. Multiphase CT imaging of the brain was performed following IV bolus contrast injection. Subsequent parametric perfusion maps were calculated using RAPID software. CONTRAST:  137m OMNIPAQUE IOHEXOL 350 MG/ML SOLN COMPARISON:  CT head 05/10/2020.  MRI and MRA head 05/10/2020 FINDINGS: CTA NECK FINDINGS Aortic arch: Standard branching. Imaged portion shows no evidence of aneurysm or dissection. No significant stenosis of the major arch vessel origins. Right carotid system: Mild atherosclerotic calcification right carotid bifurcation without significant stenosis. No irregularity or thrombus in the right carotid. Left carotid system: Mild atherosclerotic disease left carotid without stenosis or irregularity. Vertebral arteries: Both vertebral arteries widely patent. Skeleton: Disc degeneration and spurring. Foraminal narrowing due to spurring bilaterally at C5-6 and C6-7. No  acute skeletal abnormality. Other neck: No mass or edema identified. Upper chest: Lung apices clear bilaterally. Review of the MIP images confirms the above findings CTA HEAD FINDINGS Anterior circulation: Cavernous carotid widely patent bilaterally. Anterior cerebral arteries widely patent. Left middle cerebral artery widely patent. Right M1 segment widely patent. There is occlusion of a large right M3 branch superior division right MCA as noted on prior MRA. This appears acute. Posterior circulation: Normal posterior circulation. No stenosis or large vessel occlusion. Venous sinuses: Normal venous enhancement. Anatomic variants: None Review of the MIP images confirms the above findings CT Brain Perfusion Findings: ASPECTS: 10 CBF (<30%) Volume: 211mPerfusion (Tmax>6.0s) volume: 624mismatch Volume: 72m23mfarction Location:Right insula and right operculum. IMPRESSION: 1. Abnormal CT perfusion. 29 mm core infarct with additional 33 mL of surrounding penumbra in the right insula and operculum corresponding to restricted diffusion on MRI. 2. Occluded right superior branch of M3 which is a relatively large vessel compatible with an embolus. 3. No significant carotid  or vertebral artery stenosis in the neck. Electronically Signed: By: Franchot Gallo M.D. On: 05/10/2020 15:15   DG CHEST PORT 1 VIEW  Result Date: 05/12/2020 CLINICAL DATA:  Aspiration EXAM: PORTABLE CHEST 1 VIEW COMPARISON:  05/11/2020 FINDINGS: Nasogastric tube extends into the upper abdomen. Mild elevation of the right hemidiaphragm is unchanged with associated right basilar atelectasis. Lungs are otherwise clear. No pneumothorax or pleural effusion. Cardiac size is within normal limits. Apparent mediastinal widening is likely artifactual and related to semi-erect positioning. Pulmonary vascularity is normal. IMPRESSION: Stable mild elevation of the right hemidiaphragm with associated right basilar atelectasis. Electronically Signed   By: Fidela Salisbury MD   On: 05/12/2020 06:11   DG CHEST PORT 1 VIEW  Result Date: 05/11/2020 CLINICAL DATA:  Hypoxia EXAM: PORTABLE CHEST 1 VIEW COMPARISON:  05/10/2020 FINDINGS: Cardiac shadow is stable. Gastric catheter is noted in satisfactory position. Previously seen endotracheal tube is no longer identified. Bibasilar atelectatic changes are noted slightly increased when compared with the prior exam likely accentuated by poor inspiratory effort. No acute bony abnormality is seen. IMPRESSION: Increasing bibasilar atelectasis. Electronically Signed   By: Inez Catalina M.D.   On: 05/11/2020 12:48   DG CHEST PORT 1 VIEW  Result Date: 05/10/2020 CLINICAL DATA:  Endotracheal tube adjustment EXAM: PORTABLE CHEST 1 VIEW COMPARISON:  05/10/2020 FINDINGS: Single frontal view of the chest demonstrates endotracheal tube overlying tracheal air column, tip remains approximately 8.7 cm above carina. Recommend advancing 5-6 cm. Cardiac silhouette is stable. Hypoventilatory changes at the lung bases. No effusion or pneumothorax. IMPRESSION: 1. Endotracheal tube remains well above carina, recommend advancing 5-6 cm. 2. Bilateral lower lobe hypoventilatory changes. Electronically Signed   By: Randa Ngo M.D.   On: 05/10/2020 20:18   DG CHEST PORT 1 VIEW  Result Date: 05/10/2020 CLINICAL DATA:  Endotracheal tube present. EXAM: PORTABLE CHEST 1 VIEW COMPARISON:  Earlier this day. FINDINGS: The endotracheal tube is been retracted, the tip is now 9.7 cm above the carina. Re-expansion of the right upper lobe in the interim. Low lung volumes with patchy bibasilar opacities, left greater than right. No visualized pneumothorax. Stable heart size and mediastinal contours. IMPRESSION: 1. Endotracheal tube retracted, tip now 9.7 cm above the carina. Advancement of 5-6 cm would place the endotracheal tube tip at the level of the clavicular heads. 2. Re-expansion of the right upper lobe. 3. Low lung volumes with patchy bibasilar  opacities, left greater than right, likely atelectasis. Electronically Signed   By: Keith Rake M.D.   On: 05/10/2020 20:06   Portable Chest x-ray  Result Date: 05/10/2020 CLINICAL DATA:  Intubated, stroke EXAM: PORTABLE CHEST 1 VIEW COMPARISON:  None. FINDINGS: Single frontal view of the chest demonstrates endotracheal tube overlying tracheal air column, tip approximately 1.5 cm above carina. There is dense right upper lobe consolidation with volume loss consistent with atelectasis. This could be due to mucous plugging. Hypoventilatory changes are seen at the left lung base. No effusion or pneumothorax. No acute bony abnormalities. IMPRESSION: 1. Endotracheal tube approximately 1.5 cm above carina. 2. Dense right upper lobe consolidation and volume loss consistent with atelectasis. This could be due to mucous plugging. 3. Hypoventilatory changes at the left lung base. These results were called by telephone at the time of interpretation on 05/10/2020 at 7:18 pm to provider DR Lucile Shutters , who verbally acknowledged these results. Electronically Signed   By: Randa Ngo M.D.   On: 05/10/2020 19:31   DG Abd Portable 1V  Result Date: 05/11/2020 CLINICAL DATA:  Check gastric catheter placement EXAM: PORTABLE ABDOMEN - 1 VIEW COMPARISON:  None. FINDINGS: Gastric catheter is noted within the stomach. No obstructive changes are seen. No free air is noted. IMPRESSION: Gastric catheter within the stomach. Electronically Signed   By: Inez Catalina M.D.   On: 05/11/2020 12:58   DG Swallowing Func-Speech Pathology  Result Date: 05/12/2020 Objective Swallowing Evaluation: Type of Study: MBS-Modified Barium Swallow Study  Patient Details Name: Thomas Sampson MRN: [id number] Date of Birth: 01/15/1946 Today's Date: 05/12/2020 Time: SLP Start Time (ACUTE ONLY): 1403 -SLP Stop Time (ACUTE ONLY): 1424 SLP Time Calculation (min) (ACUTE ONLY): 21 min Past Medical History: Past Medical History: Diagnosis Date . Chronic kidney  disease   kidney stones . Diabetes mellitus without complication ([place])  . Hyperlipidemia  . Hypertension  . Tinea pedis  Past Surgical History: Past Surgical History: Procedure Laterality Date . COLON SURGERY   . COLONOSCOPY WITH PROPOFOL N/A 09/18/2016  Procedure: COLONOSCOPY WITH PROPOFOL;  Surgeon: Lollie Sails, MD;  Location: [facility] ENDOSCOPY;  Service: Endoscopy;  Laterality: N/A; . EYE SURGERY    cataract extraction . IR CT HEAD LTD  05/10/2020 . IR PERCUTANEOUS ART THROMBECTOMY/INFUSION INTRACRANIAL INC DIAG ANGIO  05/10/2020 . RADIOLOGY WITH ANESTHESIA N/A 05/10/2020  Procedure: IR WITH ANESTHESIA;  Surgeon: Radiologist, Medication, MD;  Location: [facility];  Service: Radiology;  Laterality: N/A; HPI: 75 year-old male presented to [facility] on 4/11 with new left-sided weakness and slurred speech. 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  On 4/11 he was emergently underwent revascularization of occluded superior division right MCA through mechanical thrombectomy.  ETT 4/11-/12.  No data recorded Assessment / Plan / Recommendation CHL IP CLINICAL IMPRESSIONS 05/12/2020 Clinical Impression Oropharyngeal dysphagia exhibited by lingual pumping, decreased cohesion, delayed transit and mistimed sequlae of swallow. Delayed and effortful bolus propulsion with bolus on posterior base of tongue while pt attempting to retract tongue and initiate swallow. There was left anterior spill and stasis with thinner consistencies. Thin barium penetrated vestibule and was aspirated into before protective mechanisms were initiated followed by reflexive cough. A chin tuck did not eliminate or reduce aspiration which occured silently. Nectar thick boluses consistently reached his pyriform sinuses for 4-5 seconds prior to onset. Minimal valleculuar residue. Challenged with nectar with larger sips via straw filling  pyriform sinuses which did not enter vestibule. Recommend pt initiate Dys 1 (pureee), nectar, crush pills, check left side oral cavity for pocketing and full assist for strategies. SLP Visit Diagnosis Dysphagia, oropharyngeal phase (R13.12) Attention and concentration deficit following -- Frontal lobe and executive function deficit following -- Impact on safety and function Moderate aspiration risk   CHL IP TREATMENT RECOMMENDATION 05/12/2020 Treatment Recommendations Therapy as outlined in treatment plan below   Prognosis 05/12/2020 Prognosis for Safe Diet Advancement Good Barriers to Reach Goals -- Barriers/Prognosis Comment -- CHL IP DIET RECOMMENDATION 05/12/2020 SLP Diet Recommendations Dysphagia 1 (Puree) solids;Nectar thick liquid Liquid Administration via Cup;Straw Medication Administration Crushed with puree Compensations Minimize environmental distractions;Slow rate;Small sips/bites;Lingual sweep for clearance of pocketing Postural Changes Seated upright at 90 degrees   CHL IP OTHER RECOMMENDATIONS 05/12/2020 Recommended Consults -- Oral Care Recommendations Oral care BID Other Recommendations Order thickener from pharmacy   CHL IP FOLLOW UP RECOMMENDATIONS 05/12/2020 Follow up Recommendations Inpatient Rehab   CHL IP FREQUENCY AND DURATION 05/12/2020 Speech Therapy Frequency (ACUTE ONLY)  min 2x/week Treatment Duration 2 weeks      CHL IP ORAL PHASE 05/12/2020 Oral Phase Impaired Oral - Pudding Teaspoon -- Oral - Pudding Cup -- Oral - Honey Teaspoon -- Oral - Honey Cup -- Oral - Nectar Teaspoon -- Oral - Nectar Cup Lingual pumping;Delayed oral transit;Weak lingual manipulation;Reduced posterior propulsion Oral - Nectar Straw -- Oral - Thin Teaspoon -- Oral - Thin Cup Left anterior bolus loss;Decreased bolus cohesion;Left pocketing in lateral sulci Oral - Thin Straw -- Oral - Puree WFL Oral - Mech Soft -- Oral - Regular Delayed oral transit;Weak lingual manipulation;Impaired mastication Oral - Multi-Consistency --  Oral - Pill -- Oral Phase - Comment --  CHL IP PHARYNGEAL PHASE 05/12/2020 Pharyngeal Phase Impaired Pharyngeal- Pudding Teaspoon -- Pharyngeal -- Pharyngeal- Pudding Cup -- Pharyngeal -- Pharyngeal- Honey Teaspoon -- Pharyngeal -- Pharyngeal- Honey Cup -- Pharyngeal -- Pharyngeal- Nectar Teaspoon -- Pharyngeal -- Pharyngeal- Nectar Cup Delayed swallow initiation-pyriform sinuses;Delayed swallow initiation-vallecula;Pharyngeal residue - valleculae Pharyngeal -- Pharyngeal- Nectar Straw -- Pharyngeal -- Pharyngeal- Thin Teaspoon -- Pharyngeal -- Pharyngeal- Thin Cup Penetration/Aspiration before swallow;Compensatory strategies attempted (with notebox) Pharyngeal Material enters airway, passes BELOW cords without attempt by patient to eject out (silent aspiration);Material enters airway, passes BELOW cords and not ejected out despite cough attempt by patient Pharyngeal- Thin Straw -- Pharyngeal -- Pharyngeal- Puree WFL Pharyngeal -- Pharyngeal- Mechanical Soft -- Pharyngeal -- Pharyngeal- Regular WFL Pharyngeal -- Pharyngeal- Multi-consistency -- Pharyngeal -- Pharyngeal- Pill -- Pharyngeal -- Pharyngeal Comment --  CHL IP CERVICAL ESOPHAGEAL PHASE 05/12/2020 Cervical Esophageal Phase WFL Pudding Teaspoon -- Pudding Cup -- Honey Teaspoon -- Honey Cup -- Nectar Teaspoon -- Nectar Cup -- Nectar Straw -- Thin Teaspoon -- Thin Cup -- Thin Straw -- Puree -- Mechanical Soft -- Regular -- Multi-consistency -- Pill -- Cervical Esophageal Comment -- [place] Siren 05/12/2020, 4:48 PM  Orbie Pyo Litaker M.Ed Actor Pager [phone] Office [phone]             ECHOCARDIOGRAM COMPLETE  Result Date: 05/12/2020    ECHOCARDIOGRAM REPORT   Patient Name:   RION CATALA Date of Exam: 05/12/2020 Medical Rec #:  [id number]       Height:       69.0 in Accession #:    [id number]      Weight:       220.2 lb Date of Birth:  February 21, 1945      BSA:          2.152 m Patient Age:    38 years        BP:            106/56 mmHg Patient Gender: M               HR:           74 bpm. Exam Location:  Inpatient Procedure: 2D Echo, Cardiac Doppler and Color Doppler Indications:    CVA  History:        Patient has no prior history of Echocardiogram examinations.                 Signs/Symptoms:CKD; Risk Factors:Hypertension, Dyslipidemia,                 Diabetes and Former Smoker.  Sonographer:    Dustin Flock Referring Phys: [place]  1. Left ventricular ejection fraction, by estimation, is 70 to 75%. The left ventricle has hyperdynamic function. The left ventricle has no regional wall  motion abnormalities. There is mild concentric left ventricular hypertrophy. Left ventricular diastolic parameters were normal.  2. Right ventricular systolic function is normal. The right ventricular size is normal.  3. The mitral valve is normal in structure. Trivial mitral valve regurgitation.  4. The aortic valve is tricuspid. There is mild thickening of the aortic valve. Aortic valve regurgitation is not visualized. No aortic stenosis is present.  5. Aortic dilatation noted. There is mild dilatation of the aortic root, measuring 40 mm. Comparison(s): No prior Echocardiogram. Conclusion(s)/Recommendation(s): No intracardiac source of embolism detected on this transthoracic study. A transesophageal echocardiogram is recommended to exclude cardiac source of embolism if clinically indicated. FINDINGS  Left Ventricle: Left ventricular ejection fraction, by estimation, is 70 to 75%. The left ventricle has hyperdynamic function. The left ventricle has no regional wall motion abnormalities. The left ventricular internal cavity size was normal in size. There is mild concentric left ventricular hypertrophy. Left ventricular diastolic parameters were normal. Right Ventricle: The right ventricular size is normal. Right vetricular wall thickness was not well visualized. Right ventricular systolic function is normal. Left Atrium:  Left atrial size was normal in size. Right Atrium: Right atrial size was normal in size. Pericardium: There is no evidence of pericardial effusion. Mitral Valve: The mitral valve is normal in structure. Trivial mitral valve regurgitation. Tricuspid Valve: The tricuspid valve is normal in structure. Tricuspid valve regurgitation is trivial. Aortic Valve: The aortic valve is tricuspid. There is mild thickening of the aortic valve. Aortic valve regurgitation is not visualized. No aortic stenosis is present. Pulmonic Valve: The pulmonic valve was normal in structure. Pulmonic valve regurgitation is trivial. Aorta: Aortic dilatation noted. There is mild dilatation of the aortic root, measuring 40 mm. IAS/Shunts: No atrial level shunt detected by color flow Doppler.  LEFT VENTRICLE PLAX 2D LVIDd:         4.25 cm  Diastology LVIDs:         2.00 cm  LV e' medial:    7.18 cm/s LV PW:         0.90 cm  LV E/e' medial:  10.3 LV IVS:        1.05 cm  LV e' lateral:   9.57 cm/s LVOT diam:     2.00 cm  LV E/e' lateral: 7.7 LV SV:         71 LV SV Index:   33 LVOT Area:     3.14 cm  RIGHT VENTRICLE RV Basal diam:  3.40 cm RV S prime:     17.90 cm/s TAPSE (M-mode): 3.3 cm LEFT ATRIUM             Index       RIGHT ATRIUM           Index LA diam:        3.50 cm 1.63 cm/m  RA Area:     16.00 cm LA Vol (A2C):   47.9 ml 22.25 ml/m RA Volume:   42.30 ml  19.65 ml/m LA Vol (A4C):   33.7 ml 15.66 ml/m LA Biplane Vol: 41.9 ml 19.47 ml/m  AORTIC VALVE LVOT Vmax:   127.00 cm/s LVOT Vmean:  82.500 cm/s LVOT VTI:    0.225 m  AORTA Ao Root diam: 3.50 cm MITRAL VALVE MV Area (PHT): 4.21 cm    SHUNTS MV Decel Time: 180 msec    Systemic VTI:  0.22 m MV E velocity: 74.10 cm/s  Systemic Diam: 2.00 cm MV A velocity: 75.00 cm/s MV E/A  ratio:  0.99 Gwyndolyn Kaufman MD Electronically signed by Gwyndolyn Kaufman MD Signature Date/Time: 05/12/2020/12:59:11 PM    Final    IR PERCUTANEOUS ART THROMBECTOMY/INFUSION INTRACRANIAL INC DIAG  ANGIO  Result Date: 05/12/2020 INDICATION: New onset of right gaze deviation and left sided weakness arm greater than leg. 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 EXAM: 1. EMERGENT LARGE VESSEL OCCLUSION THROMBOLYSIS (anterior CIRCULATION) COMPARISON:  CT angiogram of head and neck of May 10, 2020. MEDICATIONS: Ancef 2 g IV antibiotic was administered within 1 hour of the procedure. ANESTHESIA/SEDATION: General anesthesia CONTRAST:  Isovue 300 approximately 100 mL. FLUOROSCOPY TIME:  Fluoroscopy Time: 79 minutes 24 seconds (2346 mGy). COMPLICATIONS: None immediate. TECHNIQUE: Following a full explanation of the procedure along with the potential associated complications, an informed witnessed consent was obtained. The risks of intracranial hemorrhage of 10%, worsening neurological deficit, ventilator dependency, death and inability to revascularize were all reviewed in detail with the patient's spouse. The patient was then put under general anesthesia by the Department of Anesthesiology at [facility]. The right groin was prepped and draped in the usual sterile fashion. Thereafter using modified Seldinger technique, transfemoral access into the right common femoral artery was obtained without difficulty. Over a 0.035 inch guidewire an 8 [place] Pinnacle 25 cm sheath was inserted. Through this, and also over a 0.035 inch guidewire a combination of a 5.5 French Simmons 2 catheter inside of the 087 balloon guide catheter was advanced over a 0.035 inch Roadrunner guidewire and positioned without difficulty into the right common carotid artery. The guidewire was removed. Good aspiration was obtained from the hub of the balloon guide catheter. FINDINGS: A gentle control arteriogram performed through the balloon guide catheter demonstrated no evidence of spasms, dissections or of intraluminal filling defects. The right common carotid arteriogram demonstrates wide  patency of the right external carotid artery and its major branches. The right internal carotid artery at the bulb to the cranial skull base demonstrates wide patency. The petrous, the cavernous and supraclinoid segments are widely patent. The right middle cerebral artery and the right anterior cerebral artery are seen to opacify into the capillary and venous phases. 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 Right posterior communicating artery is seen opacifying the right posterior cerebral artery distribution. PROCEDURE: Through the 087 balloon guide catheter in the right common carotid artery, a combination of an 055 136 Zoom aspiration catheter with an 035 Zoom aspiration catheter was advanced over a 0.014 inch standard Synchro micro guidewire with a J configuration to the right M1 segment. Using a torque device, access was obtained to just proximal to the occluded superior division M2 segment. Access to the distal M3 M4 inferior division branch was obtained just distal to the occluded site. However, advancement of the 035 Zoom aspiration catheter was met with significant resistance with change in configuration distally. The micro guidewire, and the 035 Zoom aspiration catheter were removed. The 055 Zoom aspiration was advanced to be just distal to the occluded site of the superior division. Constant aspiration was then performed via a Penumbra aspiration device for approximately 2 minutes. Thereafter, the 055 Zoom aspiration was retrieved and removed. A control arteriogram performed through the balloon guide in the distal right ICA continued to demonstrate no change in the occluded superior division prominent branch. No clot was noted in the aspirate or within the 055 aspiration catheter. Another pass  was then made again using the 055 aspiration catheter with a 162 021 microcatheter over a 0.014 inch standard Synchro micro  guidewire. This combination was again advanced as described earlier and positioned at the site of the occlusion. The micro guidewire was replaced with an 016 Fathom micro guidewire. This advanced easily through the occluded superior division occluded branch into the M4 region. This was then followed by the microcatheter. The guidewire was removed. Good aspiration obtained from the hub of the microcatheter. A gentle control arteriogram performed through the microcatheter demonstrated safe position of the tip of the microcatheter which was then connected to continuous heparinized saline infusion. A 3 mm x 40 mm Solitaire X retrieval device was then advanced to the distal end of the microcatheter and deployed in the usual manner without difficulty. The microcatheter was retrieved more proximally into the right internal carotid artery. The 055 Zoom aspiration was then advanced abutting against the occluded superior division M2 segment. With proximal flow arrest in the right internal carotid artery, and continuous aspiration with a 20 mL syringe at the hub of the balloon guide catheter, and constant aspiration applied at the hub of the 055 Zoom aspiration catheter for approximately 2-1/2 minutes, the combination of the retrieval device, the microcatheter and the Zoom 055 aspiration catheter were retrieved and removed. Thick sticky clot was seen entangled within the retrieval device, and also in the hub of the [employer] [place]. A control arteriogram performed following reversal of flow arrest in the right internal carotid artery demonstrated now complete revascularization of 2 of the 3 main branches emanating from the superior division. However, there continued be a distal M3 occlusion of an anterior perisylvian branch leading on up to the hyper perfused subcortical frontal perisylvian region seen on the CT perfusion mapping studies. A TICI 2C revascularization was achieved. 200 mcg of nitroglycerin was then infused through  the balloon guide catheter in the right internal carotid artery to obviate mild to moderate spasm at the proximal superior division of the right MCA. A final control arteriogram performed through the balloon guide in the right common carotid artery continued to demonstrate patency of the extracranial, and intracranial right ICA, a TICI 2C revascularization of the right middle cerebral artery distribution. The right anterior cerebral artery and right posterior communicating artery demonstrate wide patency unchanged. The balloon guide was removed. An 8 French Angio-Seal closure device was applied for hemostasis at the right groin puncture site. Distal pulses were Dopplerable in both feet unchanged. A CT of the brain demonstrated focal area of hyperattenuation in the putamen on the right side associated with moderate hyperattenuation in the posterior right perisylvian subarachnoid space. No evidence of mass or midline shift was noted. The patient was left intubated for airway protection. He was then transferred to the neuro ICU for post embolization management. IMPRESSION: Status post endovascular near complete revascularization of occluded superior division of the right middle cerebral artery with 1 pass with contact aspiration, and 1 pass with contact aspiration with a 3 mm x 40 mm Solitaire retrieval device achieving a TICI 2C revascularization. PLAN: Follow-up with the referring MD. Electronically Signed   By: Luanne Bras M.D.   On: 05/11/2020 10:58   VAS [place] LOWER EXTREMITY VENOUS (DVT)  Result Date: 05/12/2020  Lower Venous DVT Study Indications: Swelling and erythema LT>RT, s/p stroke.  Limitations: S/P RT cath. Comparison Study: No prior studies. Performing Technologist: Darlin Coco RDMS,RVT  Examination Guidelines: A complete evaluation includes B-mode imaging, spectral Doppler, color Doppler,  and power Doppler as needed of all accessible portions of each vessel. Bilateral testing is considered an  integral part of a complete examination. Limited examinations for reoccurring indications may be performed as noted. The reflux portion of the exam is performed with the patient in reverse Trendelenburg.  +---------+---------------+---------+-----------+----------+-------------------+ RIGHT    CompressibilityPhasicitySpontaneityPropertiesThrombus Aging      +---------+---------------+---------+-----------+----------+-------------------+ CFV      Full           Yes      Yes                                      +---------+---------------+---------+-----------+----------+-------------------+ SFJ                                                   Limited visibility                                                        due to recent cath  +---------+---------------+---------+-----------+----------+-------------------+ FV Prox  Full                                                             +---------+---------------+---------+-----------+----------+-------------------+ FV Mid   Full                                                             +---------+---------------+---------+-----------+----------+-------------------+ FV DistalFull                                                             +---------+---------------+---------+-----------+----------+-------------------+ PFV      Full                                                             +---------+---------------+---------+-----------+----------+-------------------+ POP      Full           Yes      Yes                                      +---------+---------------+---------+-----------+----------+-------------------+ PTV      Full                                                             +---------+---------------+---------+-----------+----------+-------------------+  PERO     Full                                                              +---------+---------------+---------+-----------+----------+-------------------+   +---------+---------------+---------+-----------+----------+--------------+ LEFT     CompressibilityPhasicitySpontaneityPropertiesThrombus Aging +---------+---------------+---------+-----------+----------+--------------+ CFV      Full           Yes      Yes                                 +---------+---------------+---------+-----------+----------+--------------+ SFJ      Full                                                        +---------+---------------+---------+-----------+----------+--------------+ FV Prox  Full                                                        +---------+---------------+---------+-----------+----------+--------------+ FV Mid   Full                                                        +---------+---------------+---------+-----------+----------+--------------+ FV DistalFull                                                        +---------+---------------+---------+-----------+----------+--------------+ PFV      Full                                                        +---------+---------------+---------+-----------+----------+--------------+ POP      Full           Yes      Yes                                 +---------+---------------+---------+-----------+----------+--------------+ PTV      Full                                                        +---------+---------------+---------+-----------+----------+--------------+ PERO     Full                                                        +---------+---------------+---------+-----------+----------+--------------+  Summary: RIGHT: - There is no evidence of deep vein thrombosis in the lower extremity.  - No cystic structure found in the popliteal fossa.  LEFT: - There is no evidence of deep vein thrombosis in the lower extremity.  - No cystic structure found in the popliteal  fossa.  *See table(s) above for measurements and observations. Electronically signed by Monica Martinez MD on 05/12/2020 at 2:16:36 PM.    Final    CT ANGIO HEAD CODE STROKE  Addendum Date: 05/10/2020   ADDENDUM REPORT: 05/10/2020 15:20 ADDENDUM: These results were called by telephone at the time of interpretation on 05/10/2020 at 3:20 pm to provider [facility] , who verbally acknowledged these results. Electronically Signed   By: Franchot Gallo M.D.   On: 05/10/2020 15:20   Result Date: 05/10/2020 CLINICAL DATA:  Stroke. EXAM: CT ANGIOGRAPHY HEAD AND NECK CT PERFUSION BRAIN TECHNIQUE: Multidetector CT imaging of the head and neck was performed using the standard protocol during bolus administration of intravenous contrast. Multiplanar CT image reconstructions and MIPs were obtained to evaluate the vascular anatomy. Carotid stenosis measurements (when applicable) are obtained utilizing NASCET criteria, using the distal internal carotid diameter as the denominator. Multiphase CT imaging of the brain was performed following IV bolus contrast injection. Subsequent parametric perfusion maps were calculated using RAPID software. CONTRAST:  162m OMNIPAQUE IOHEXOL 350 MG/ML SOLN COMPARISON:  CT head 05/10/2020.  MRI and MRA head 05/10/2020 FINDINGS: CTA NECK FINDINGS Aortic arch: Standard branching. Imaged portion shows no evidence of aneurysm or dissection. No significant stenosis of the major arch vessel origins. Right carotid system: Mild atherosclerotic calcification right carotid bifurcation without significant stenosis. No irregularity or thrombus in the right carotid. Left carotid system: Mild atherosclerotic disease left carotid without stenosis or irregularity. Vertebral arteries: Both vertebral arteries widely patent. Skeleton: Disc degeneration and spurring. Foraminal narrowing due to spurring bilaterally at C5-6 and C6-7. No acute skeletal abnormality. Other neck: No mass or edema identified.  Upper chest: Lung apices clear bilaterally. Review of the MIP images confirms the above findings CTA HEAD FINDINGS Anterior circulation: Cavernous carotid widely patent bilaterally. Anterior cerebral arteries widely patent. Left middle cerebral artery widely patent. Right M1 segment widely patent. There is occlusion of a large right M3 branch superior division right MCA as noted on prior MRA. This appears acute. Posterior circulation: Normal posterior circulation. No stenosis or large vessel occlusion. Venous sinuses: Normal venous enhancement. Anatomic variants: None Review of the MIP images confirms the above findings CT Brain Perfusion Findings: ASPECTS: 10 CBF (<30%) Volume: 210mPerfusion (Tmax>6.0s) volume: 6234mismatch Volume: 23m58mfarction Location:Right insula and right operculum. IMPRESSION: 1. Abnormal CT perfusion. 29 mm core infarct with additional 33 mL of surrounding penumbra in the right insula and operculum corresponding to restricted diffusion on MRI. 2. Occluded right superior branch of M3 which is a relatively large vessel compatible with an embolus. 3. No significant carotid or vertebral artery stenosis in the neck. Electronically Signed: By: CharFranchot Gallo. On: 05/10/2020 15:15   CT ANGIO NECK CODE STROKE  Addendum Date: 05/10/2020   ADDENDUM REPORT: 05/10/2020 15:20 ADDENDUM: These results were called by telephone at the time of interpretation on 05/10/2020 at 3:20 pm [facility]. Electronically Signed   By: CharFranchot Gallo.   On: 05/10/2020 15:20   Result Date: 05/10/2020 CLINICAL DATA:  Stroke. EXAM: CT ANGIOGRAPHY HEAD AND NECK CT PERFUSION BRAIN TECHNIQUE: Multidetector CT imaging of the head and  neck was performed using the standard protocol during bolus administration of intravenous contrast. Multiplanar CT image reconstructions and MIPs were obtained to evaluate the vascular anatomy. Carotid stenosis measurements  (when applicable) are obtained utilizing NASCET criteria, using the distal internal carotid diameter as the denominator. Multiphase CT imaging of the brain was performed following IV bolus contrast injection. Subsequent parametric perfusion maps were calculated using RAPID software. CONTRAST:  175m OMNIPAQUE IOHEXOL 350 MG/ML SOLN COMPARISON:  CT head 05/10/2020.  MRI and MRA head 05/10/2020 FINDINGS: CTA NECK FINDINGS Aortic arch: Standard branching. Imaged portion shows no evidence of aneurysm or dissection. No significant stenosis of the major arch vessel origins. Right carotid system: Mild atherosclerotic calcification right carotid bifurcation without significant stenosis. No irregularity or thrombus in the right carotid. Left carotid system: Mild atherosclerotic disease left carotid without stenosis or irregularity. Vertebral arteries: Both vertebral arteries widely patent. Skeleton: Disc degeneration and spurring. Foraminal narrowing due to spurring bilaterally at C5-6 and C6-7. No acute skeletal abnormality. Other neck: No mass or edema identified. Upper chest: Lung apices clear bilaterally. Review of the MIP images confirms the above findings CTA HEAD FINDINGS Anterior circulation: Cavernous carotid widely patent bilaterally. Anterior cerebral arteries widely patent. Left middle cerebral artery widely patent. Right M1 segment widely patent. There is occlusion of a large right M3 branch superior division right MCA as noted on prior MRA. This appears acute. Posterior circulation: Normal posterior circulation. No stenosis or large vessel occlusion. Venous sinuses: Normal venous enhancement. Anatomic variants: None Review of the MIP images confirms the above findings CT Brain Perfusion Findings: ASPECTS: 10 CBF (<30%) Volume: 236mPerfusion (Tmax>6.0s) volume: 6243mismatch Volume: 43m72mfarction Location:Right insula and right operculum. IMPRESSION: 1. Abnormal CT perfusion. 29 mm core infarct with  additional 33 mL of surrounding penumbra in the right insula and operculum corresponding to restricted diffusion on MRI. 2. Occluded right superior branch of M3 which is a relatively large vessel compatible with an embolus. 3. No significant carotid or vertebral artery stenosis in the neck. Electronically Signed: By: CharFranchot Gallo. On: 05/10/2020 15:15    Labs:  Basic Metabolic Panel: No results for input(s): NA, K, CL, CO2, GLUCOSE, BUN, CREATININE, CALCIUM, MG, PHOS in the last 168 hours.  CBC: No results for input(s): WBC, NEUTROABS, HGB, HCT, MCV, PLT in the last 168 hours.  CBG: Recent Labs  Lab 05/26/20 2106 05/27/20 0606 05/27/20 1206 05/27/20 1645 05/27/20 2103  GLUCAP 146* 152* 170* 110* 128*   Family history.  Mother with cancer Father with myocardial infarction Sister with CAD Brother with diabetes.  Denies any colon cancer esophageal cancer or rectal cancer  Brief HPI:   Thomas Sampson 74 y75. right-handed male with history of chronic kidney disease stage IV diabetes mellitus hypertension hyperlipidemia and tobacco abuse.  Patient lives with spouse independent prior to admission.  1 level home with ramped entrance.  [facility] and slurred speech of acute onset.  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  No significant carotid or vertebral artery stenosis of the neck.  Admission chemistries unremarkable except glucose 207 BUN 31 creatinine 2.26 hemoglobin A1c 8.1.  Echocardiogram with ejection fraction of 70 to 75% no wall motion abnormalities.  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  Lower extremity Dopplers negative.  Maintained on Cleviprex for blood pressure control.  Neurology  follow-up recommendations of aspirin 81 mg daily and Plavix 75 mg daily for 3 months then Plavix alone.  Dysphagia #1 nectar thick liquid diet.  Therapy evaluations completed due to patient's left-sided weakness and slurred speech was admitted for a comprehensive rehab program.   Hospital Course: QUANTRELL SPLITT was admitted to rehab 05/14/2020 for inpatient therapies to consist of PT, ST and OT at least three hours five days a week. Past admission physiatrist, therapy team and rehab RN have worked together to provide customized collaborative inpatient rehab.  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  He will continue on aspirin and Plavix x3 months then Plavix alone.  Mood stabilization with the initiation of Nuedexta and follow-up per neuropsychology.  Pain management with the use of Lyrica 25 mg twice daily as well as Lidoderm patch for chronic shoulder pain.  His diet has been advanced to regular consistency.  Permissive blood pressure controlled on Norvasc and follow-up outpatient.  CKD stage IV latest creatinine 1.97.  Blood sugars monitored hemoglobin A1c 8.1 maintained on NovoLog as well as Lantus insulin Glucotrol 2.5 mg in the a.m.  Full diabetic teaching completed and patient again would need outpatient follow-up.  Bouts of insomnia improved with the use of trazodone.  Lipitor ongoing for hyperlipidemia.  Patient did have a history of tobacco abuse received counsel regards to cessation of nicotine products it was questionable if he would be compliant with these request.   Blood pressures were monitored on TID basis and controlled  Diabetes has been monitored with ac/hs CBG checks and SSI was use prn for tighter BS control.    Rehab course: During patient's stay in rehab weekly team conferences were held to monitor patient's progress, set goals and discuss barriers to  discharge. At admission, patient required minimal assist stand pivot transfers minimal assist 5 feet to person hand-held assist minimal assist supine to sit.  Moderate assist upper body bathing max is lower body bathing moderate assist upper body dressing max is lower body dressing  Physical exam.  Blood pressure 126/70 pulse 61 temperature 98.4 respirations 18 oxygen saturations 92% room air Constitutional.  No acute distress HEENT Head.  Normocephalic and atraumatic Eyes.  Pupils round and reactive to light no discharge without nystagmus Neck.  Supple nontender no JVD without thyromegaly Cardiac regular rate rhythm without extra sounds or murmur heard Abdomen.  Soft nontender positive bowel sounds without rebound Respiratory effort normal no respiratory distress without wheeze Musculoskeletal.  Mild right and left shoulder tenderness with active passive range of motion. Skin.  Warm and dry Neurologic.  Alert no acute distress exhibit some left inattention speech dysarthric but intelligible.  He follows commands.  Left central 7 and tongue deviation.  Senses pain and light touch left face arm and leg.  DTRs 2+.  Left upper and left lower extremity are 1+ on right.  Left upper extremity 1/5 deltoid, pec, biceps and 0/5 elsewhere.  Left lower extremity 3-3+/5 hip flexors, knee extension ankle dorsi plantarflexion  He/She  has had improvement in activity tolerance, balance, postural control as well as ability to compensate for deficits. He/She has had improvement in functional use RUE/LUE  and RLE/LLE as well as improvement in awareness.  Patient ambulates 200 feet using SBQC with close supervision for balance.  Demonstrated good step height during  sessions.  Patient performed STS and SPV T transfers throughout session with supervision.  He was able to navigate through obstacles with supervision.  Ambulation to the toilet lower body clothing with supervision ambulates to the sink contact-guard assist.   Donned his support hose in bilateral shoes with minimal assist.  Patient was able to communicate his needs.  Full family teaching completed plan discharged to home       Disposition: Discharged to home Discharge disposition: 01-Home or Self Care        Diet: Diabetic diet  Special Instructions: No driving smoking or alcohol  Plan is for aspirin 81 mg daily and Plavix 75 mg daily x3 months total then Plavix alone  Medications at discharge 1.  Tylenol as needed 2.  Norvasc 5 mg p.o. daily 3.  Aspirin 325 mg p.o. daily 4.  Lipitor 40 mg p.o. daily 5.  Plavix 75 mg p.o. daily 6.  Nuedexta 20-10 mg 1 tablet daily 7.  Voltaren gel 2 g 4 times daily 8.  Glucotrol 2.5 mg p.o. daily 9.  NovoLog 12 units 3 times daily with meals 10.  Lantus insulin 50 units daily 11.  Lidoderm patch change as directed 12.  Naphcon ophthalmic solution 1 drop left eye 4 times daily as needed 13.  Protonix 40 mg p.o. daily 14.  Lyrica 25 mg p.o. twice daily 15.  Trazodone 50 mg p.o. nightly 16.  Rocaltrol 0.25 mcg p.o. daily 17.Colestid 1 g p.o. twice daily 18.Cyanocobalmin 1000 mcg p.o. daily  30-35 minutes were spent completing discharge summary and discharge planning  Discharge Instructions    Ambulatory referral to Neurology   Complete by: As directed    An appointment is requested in approximately 4 weeks right MCA infarction   Ambulatory referral to Physical Medicine Rehab   Complete by: As directed    Moderate complexity follow-up 1 to 2 weeks right MCA infarction       Follow-up Information    Jillian Warth, Luanna Salk, MD Follow up.   Specialty: Physical Medicine and Rehabilitation Why: Office to call for appointment Contact information: [address] [address] [phone]               Signed: Cathlyn Parsons 05/28/2020, 5:09 AM

## 2020-05-26 NOTE — Progress Notes (Signed)
Patient ID: Thomas Sampson, male   DOB: [birth year], 74 y.o.   MRN: [id number] Team Conference Report to Patient/Family  Team Conference discussion was reviewed with the patient and caregiver, including goals, any changes in plan of care and target discharge date.  Patient and caregiver express understanding and are in agreement.  The patient has a target discharge date of 05/28/20.  SW met with pt and spouse to provide updates. Pt medically ready for d/c. Pt has all DME, spouse questioning it pt will require WC, sw will follow up with team  Erlene Quan, [place]  Dyanne Iha 05/26/2020, 12:34 PM

## 2020-05-26 NOTE — Progress Notes (Addendum)
PROGRESS NOTE   Subjective/Complaints:   No new issues, BPs stabilizing   ROS: Patient denies fever, CP, SOB, N/V/D  .  Objective:   No results found. No results for input(s): WBC, HGB, HCT, PLT in the last 72 hours. No results for input(s): NA, K, CL, CO2, GLUCOSE, BUN, CREATININE, CALCIUM in the last 72 hours.  Intake/Output Summary (Last 24 hours) at 05/26/2020 0814 Last data filed at 05/26/2020 8588 Gross per 24 hour  Intake 718 ml  Output --  Net 718 ml        Physical Exam: Vital Signs Blood pressure (!) 157/98, pulse 76, temperature 97.8 F (36.6 C), temperature source Oral, resp. rate 18, weight 99.4 kg, SpO2 95 %.   General: No acute distress Mood and affect are appropriate Heart: Regular rate and rhythm no rubs murmurs or extra sounds Lungs: Clear to auscultation, breathing unlabored, no rales or wheezes Abdomen: Positive bowel sounds, soft nontender to palpation, nondistended Extremities: No clubbing, cyanosis, or edema Skin: No evidence of breakdown, no evidence of rash  Musculoskeletal:  Comments: sternal pain with palpation and deep breaths.    Skin: General: Skin is warmand dry.  Neurological:  Mental Status: He is alert.  Comments: Patient is alert. No acute distress.  Speech is dysarthric but intelligible.  DTR's 2+ LUE and LLE and 1+ on right. LUE: 2/5 delt, pec, biceps and 1/5 hand and wrist . LLE:  3+/5 HF, KE and ADF/PF. Psychiatric:  Commentsno lability    Assessment/Plan: 1. Functional deficits which require 3+ hours per day of interdisciplinary therapy in a comprehensive inpatient rehab setting.  Physiatrist is providing close team supervision and 24 hour management of active medical problems listed below.  Physiatrist and rehab team continue to assess barriers to discharge/monitor patient progress toward functional and medical goals  Care Tool:  Bathing     Body parts bathed by patient: Chest,Abdomen,Left arm,Face,Front perineal area,Buttocks,Right upper leg,Left upper leg,Right lower leg,Left lower leg   Body parts bathed by helper: Right arm     Bathing assist Assist Level: Minimal Assistance - Patient > 75%     Upper Body Dressing/Undressing Upper body dressing   What is the patient wearing?: Pull over shirt    Upper body assist Assist Level: Minimal Assistance - Patient > 75%    Lower Body Dressing/Undressing Lower body dressing      What is the patient wearing?: Underwear/pull up     Lower body assist Assist for lower body dressing: Minimal Assistance - Patient > 75%     Toileting Toileting    Toileting assist Assist for toileting: Independent with assistive device     Transfers Chair/bed transfer  Transfers assist     Chair/bed transfer assist level: Contact Guard/Touching assist     Locomotion Ambulation   Ambulation assist      Assist level: Contact Guard/Touching assist Assistive device: Cane-quad Max distance: 150   Walk 10 feet activity   Assist     Assist level: Contact Guard/Touching assist Assistive device: Cane-quad   Walk 50 feet activity   Assist    Assist level: Contact Guard/Touching assist Assistive device: Cane-quad    Walk 150 feet  activity   Assist Walk 150 feet activity did not occur: Safety/medical concerns  Assist level: Contact Guard/Touching assist Assistive device: Cane-quad    Walk 10 feet on uneven surface  activity   Assist     Assist level: Minimal Assistance - Patient > 75% Assistive device: Tree surgeon Will patient use wheelchair at discharge?: Yes Type of Wheelchair: Manual    Wheelchair assist level: Minimal Assistance - Patient > 75% Max wheelchair distance: 150    Wheelchair 50 feet with 2 turns activity    Assist        Assist Level: Minimal Assistance - Patient > 75%   Wheelchair 150 feet  activity     Assist      Assist Level: Minimal Assistance - Patient > 75%   Blood pressure (!) 157/98, pulse 76, temperature 97.8 F (36.6 C), temperature source Oral, resp. rate 18, weight 99.4 kg, SpO2 95 %.  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 Formulized individual treatment plan based on medical history, underlying problem and comorbidities.  2.  Impaired mobility:  -DVT/anticoagulation:SCDs -antiplatelet therapy: continue Aspirin and Plavix 75 mg daily x3 months then Plavix alone 3. Pain Management:Denies neuropathic pain: decrease Lyrica to 25 mg twice daily. Add lidocaine patch for right shoulder.  4. Mood:Provide emotional support -antipsychotic agents: N/A -added nuedexta for PBA with good results 5. Neuropsych: This patientiscapable of making decisions on hisown behalf. 6. Skin/Wound Care:Routine skin checks 7. Fluids/Electrolytes/Nutrition:Routine in and outs with follow-up may d/c IV 8. Dysphagia. Dysphagia #1 nectar liquids. Follow-up speech therapy 9. CKD stage IV. Admission BUN 2.26. Follow-up chemistries- at baseline 10. Diabetes mellitus with peripheral neuropathy. Hemoglobin A1c 8.1.  -NovoLog 8 units 3 times daily -increase Lantus to 43 U -Check blood sugars before meals and at bedtime  4/17: CBGs 211-376: Cr elevated. Increase Lantus to 44U CBG (last 3)  Recent Labs    05/25/20 1630  05/25/20 2055 05/26/20 0521  GLUCAP 130* 128* 125*  goodcontrol on lantus 50U with novolog 11. Hypertension. Well controlled, continue Norvasc 2.5 mg daily. Monitor with increased mobility on rehab Vitals:   05/26/20 0523 05/26/20 0740  BP: 135/78 (!) 157/98  Pulse: 76   Resp: 18   Temp: 97.8 F (36.6 C)   SpO2: 95%   some lability will monitor prior to any amlodipine dosage change 12. Hyperlipidemia. Lipitor 13.Tobacco abuse.Counseling 14. Obesity BMI 32.36: provide counseling 15.  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 Also with costochondritis   -ice, topical remedies including voltaren gel  LOS: 12 days A FACE TO FACE EVALUATION WAS PERFORMED  Charlett Blake 05/26/2020, 8:14 AM

## 2020-05-26 NOTE — Patient Care Conference (Addendum)
Inpatient RehabilitationTeam Conference and Plan of Care Update Date: 05/26/2020   Time: 10:17 AM    Patient Name: Thomas Sampson      Medical Record Number: [id number]  Date of Birth: 1946-01-04 Sex: Male         Room/Bed: 5C08C/5C08C-01 Payor Info: Payor: MEDICARE / Plan: MEDICARE PART A AND B / Product Type: *No Product type* /    Admit Date/Time:  05/14/2020  4:07 PM  Primary Diagnosis:  Right middle cerebral artery stroke [facility])  Hospital Problems: Principal Problem:   Right middle cerebral artery stroke [facility])    Expected Discharge Date: Expected Discharge Date: 05/28/20  Team Members Present: Physician leading conference: Dr. Alysia Penna Care Coodinator Present: Dorien Chihuahua, RN, BSN, CRRN;Christina [place], [place] Nurse Present: Dorien Chihuahua, RN PT Present: Barrie Folk, PT OT Present: Other (comment) Providence Lanius, OT) SLP Present: Weston Anna, SLP PPS Coordinator present : Ileana Ladd, PT     Current Status/Progress Goal Weekly Team Focus  Bowel/Bladder             Swallow/Nutrition/ Hydration   4/26 upgraded to regular textures and thin liquids, min-Supervision A  Supervision A  tolerance of upgraded diet, education and carryover of swallow strategies   ADL's   CGA to close S ambulatory toilet/TTB transfers with cane, S for LBD but min A for shoes, min A UBB, S UBD, S for seated grooming, Brunstrum level III LUE, II hand  S to min A  LUE NMR, activity tolerance, balance/functional transfer/self-care retrianing, pt/family/DME/AE education   Mobility             Communication   Min-Supervision A conversation  Supervision A  education and carryover of intelligibility strategies   Safety/Cognition/ Behavioral Observations  Min-Supervision A  Supervision A  education, money and time management (problem solving) and recall strategies   Pain             Skin               Discharge Planning:  pt d/c home with [facility]   Team Discussion: DM and  HTN controlled. Diet advanced to Regular/Thin diet.  Patient on target to meet rehab goals: yes, currently supervision for toileting using a cane. Supervision for lower body dressing and upper body care. Supervision - min assist goals set for discharge  *See Care Plan and progress notes for long and short-term goals.   Revisions to Treatment Plan:  Working on balance and safety issues  Teaching Needs: Reminders for positioning of hand during transfers, secondary stroke risk management, dietary modifications, medications, etc.  Current Barriers to Discharge: Decreased caregivers, environment   Possible Resolutions to Barriers: Family education    Medical Summary Current Status: BPs adn CBGs improving no emotional lability  Barriers to Discharge: Medical stability   Possible Resolutions to Barriers/Weekly Focus: discharge planning   Continued Need for Acute Rehabilitation Level of Care: The patient requires daily medical management by a physician with specialized training in physical medicine and rehabilitation for the following reasons: Direction of a multidisciplinary physical rehabilitation program to maximize functional independence : Yes Medical management of patient stability for increased activity during participation in an intensive rehabilitation regime.: Yes Analysis of laboratory values and/or radiology reports with any subsequent need for medication adjustment and/or medical intervention. : Yes   I attest that I was present, lead the team conference, and concur with the assessment and plan of the team.   Dorien Chihuahua B 05/26/2020, 3:57 PM

## 2020-05-26 NOTE — Progress Notes (Signed)
Physical Therapy Session Note  Patient Details  Name: Thomas Sampson MRN: [id number] Date of Birth: 15-Apr-1945  Today's Date: 05/26/2020 PT Individual Time: 3154-0086 PT Individual Time Calculation (min): 60 min   Short Term Goals: Week 1:  PT Short Term Goal 1 (Week 1): Pt will perform bed mobility with supervisoin assist PT Short Term Goal 1 - Progress (Week 1): Met PT Short Term Goal 2 (Week 1): Pt will ambulate 135f with min assist PT Short Term Goal 2 - Progress (Week 1): Met PT Short Term Goal 3 (Week 1): [facility] Short Term Goal 3 - Progress (Week 1): Met PT Short Term Goal 4 (Week 1): Pt will propell WC [id number] supervision assist PT Short Term Goal 4 - Progress (Week 1): Met Week 2:  PT Short Term Goal 1 (Week 2): STG=LTG due to ELOS  Skilled Therapeutic Interventions/Progress Updates:   Pt received sitting in WC and agreeable to PT. Pt performed ambulatory transfer to toilet. Able to void bladder sitting on toilet. [facility]. PT then adjusted G[place]morh sling with pt in standing. Supervision assist for balance throughout.   [facility] training with QC x [id number] supervision assist. Multiple mild LOB to the L, but able to correct without assist from PT; min instruction for wide BOS to improve balance and support.   dyanmic balance training on blue foam pad while engaged in giant connect 4. With supervision assist from Pt to balance and min cues for problem solving intermittently. Use of RUE to obtain all game pieces. Standing on Blue wedge to performed LUE arches x 12 each direction with mod assist for ROM and coordination on the LE and supervision assist from PT to prevent posterior LOB.   Patient returned to room and performed stand pivot to recliner with QC and supervision assist. Pt left sitting in recliner with call bell in reach and all needs met.            Therapy Documentation Precautions:  Precautions Precautions: Fall Precaution Comments: Lt inattention, Lt hemiparesis UE>LE Restrictions Weight Bearing Restrictions: No    Vital Signs: Therapy Vitals Temp: 97.7 F (36.5 C) Temp Source: Oral Pulse Rate: 74 Resp: 17 BP: (!) 158/73 Patient Position (if appropriate): Sitting Oxygen Therapy SpO2: 96 % O2 Device: Room Air Pain: denies Therapy/Group: Individual Therapy  AusLorie Phenix27/2022, 4:18 PM

## 2020-05-26 NOTE — Progress Notes (Signed)
Occupational Therapy Session Note  Patient Details  Name: Thomas Sampson MRN: [id number] Date of Birth: [birth year]  Today's Date: 05/26/2020 OT Individual Time: 0703-0822 OT Individual Time Calculation (min): 79 min    Short Term Goals: Week 2:  OT Short Term Goal 1 (Week 2): STG = LTG 2/2 ELOS  Skilled Therapeutic Interventions/Progress Updates:     Pt received in w/c, agreeable to therapy. STS and amb to toilet close S with QC, cont void of bladder, close S for standing LB clothing management. Transitioned to shower CGA via TTB. Bathed full-body min A to bathe RUE, provided hand mit for L hand later in session, pt able to simulate bathing RUE. Small LOB exiting shower, req min A to regain balance. Completed full-body dressing with close S and min VCs for hemi technique, req total A to don TEDS, min A to don B shoes. Stood to complete oral and hair care close S. Transported to and from gym in w/c 2/2 time management. Attempted to transition to quadruped position, pt unable to achieve at this time. Completed 2x10 of modified pushups to faciliate LUE WB. Finally, 1:1 NMES applied to L digit/wrist extensors. Faciliate functional grasp and release of cup and facilitated guided movement to mouth. Req overall min to mod A to guide movement smoothly. Ratio 1:3 Rate 35 pps Waveform- Asymmetric Ramp 1.0 Pulse 562 Intensity- 35 clicks Duration -   8 min  No adverse reactions after treatment and is skin intact. Denies pain.  Pt left in recliner with chair alarm engaged, call bell in reach, and all immediate needs met.    Therapy Documentation Precautions:  Precautions Precautions: Fall Precaution Comments: Lt inattention, Lt hemiparesis UE>LE Restrictions Weight Bearing Restrictions: No Pain: Pain Assessment Pain Scale: 0-10 Pain Score: 0-No pain Faces Pain Scale: No hurt ADL: See Care Tool for more details.   Therapy/Group: Individual Therapy  Volanda Napoleon MS,  OTR/L  05/26/2020, 6:44 AM

## 2020-05-26 NOTE — Discharge Instructions (Signed)
STROKE/TIA DISCHARGE INSTRUCTIONS SMOKING Cigarette smoking nearly doubles your risk of having a stroke & is the single most alterable risk factor  If you smoke or have smoked in the last 12 months, you are advised to quit smoking for your health.  Most of the excess cardiovascular risk related to smoking disappears within a year of stopping.  Ask you doctor about anti-smoking medications  [place] Quit Line: 1-800-QUIT NOW  Free Smoking Cessation Classes [phone]  CHOLESTEROL Know your levels; limit fat & cholesterol in your diet  Lipid Panel     Component Value Date/Time   CHOL 166 05/11/2020 0405   TRIG 208 (H) 05/11/2020 0405   TRIG 204 (H) 05/11/2020 0405   HDL 33 (L) 05/11/2020 0405   CHOLHDL 5.0 05/11/2020 0405   VLDL 42 (H) 05/11/2020 0405   LDLCALC 91 05/11/2020 0405      Many patients benefit from treatment even if their cholesterol is at goal.  Goal: Total Cholesterol (CHOL) less than 160  Goal:  Triglycerides (TRIG) less than 150  Goal:  HDL greater than 40  Goal:  LDL (LDLCALC) less than 100   BLOOD PRESSURE American Stroke Association blood pressure target is less that 120/80 mm/Hg  Your discharge blood pressure is:  BP: 137/72  Monitor your blood pressure  Limit your salt and alcohol intake  Many individuals will require more than one medication for high blood pressure  DIABETES (A1c is a blood sugar average for last 3 months) Goal HGBA1c is under 7% (HBGA1c is blood sugar average for last 3 months)  Diabetes:    Lab Results  Component Value Date   HGBA1C 8.2 (H) 05/11/2020     Your HGBA1c can be lowered with medications, healthy diet, and exercise.  Check your blood sugar as directed by your physician  Call your physician if you experience unexplained or low blood sugars.  PHYSICAL ACTIVITY/REHABILITATION Goal is 30 minutes at least 4 days per week  Activity: Increase activity slowly, Therapies: Physical Therapy: Home Health Return to work:    Activity decreases your risk of heart attack and stroke and makes your heart stronger.  It helps control your weight and blood pressure; helps you relax and can improve your mood.  Participate in a regular exercise program.  Talk with your doctor about the best form of exercise for you (dancing, walking, swimming, cycling).  DIET/WEIGHT Goal is to maintain a healthy weight  Your discharge diet is:  Diet Order            DIET - DYS 1 Room service appropriate? No; Fluid consistency: Nectar Thick  Diet effective now                 liquids Your height is:    Your current weight is: Weight: 99.4 kg Your Body Mass Index (BMI) is:  BMI (Calculated): 32.35  Following the type of diet specifically designed for you will help prevent another stroke.  Your goal weight range is:    Your goal Body Mass Index (BMI) is 19-24.  Healthy food habits can help reduce 3 risk factors for stroke:  High cholesterol, hypertension, and excess weight.  RESOURCES Stroke/Support Group:  Call [phone]   STROKE EDUCATION PROVIDED/REVIEWED AND GIVEN TO PATIENT Stroke warning signs and symptoms How to activate emergency medical system (call 911). Medications prescribed at discharge. Need for follow-up after discharge. Personal risk factors for stroke. Pneumonia vaccine given:  Flu vaccine given:  My questions have been answered, the  writing is legible, and I understand these instructions.  I will adhere to these goals & educational materials that have been provided to me after my discharge from the hospital.   Inpatient Rehab Discharge Instructions  Thomas Sampson Discharge date and time: No discharge date for patient encounter.   Activities/Precautions/ Functional Status: Activity: As tolerated Diet: Diabetic diet Wound Care: Routine skin checks Functional status:  ___ No restrictions     ___ Walk up steps independently ___ 24/7 supervision/assistance   ___ Walk up steps with assistance ___  Intermittent supervision/assistance  ___ Bathe/dress independently ___ Walk with walker     _x__ Bathe/dress with assistance ___ Walk Independently    ___ Shower independently ___ Walk with assistance    ___ Shower with assistance ___ No alcohol     ___ Return to work/school ________  COMMUNITY REFERRALS UPON DISCHARGE:    Home Health:   PT     OT     ST      SNA                       Agency: [facility]  Phone: [phone]   Medical Equipment/Items Ordered: [place] Falling, Bedside Commode                                                 Agency/Supplier: [employer]   Special Instructions:  No driving smoking or alcohol  Continue aspirin 81 mg daily and Plavix 75 mg daily x3 months total then Plavix alone  My questions have been answered and I understand these instructions. I will adhere to these goals and the provided educational materials after my discharge from the hospital.  Patient/Caregiver Signature _______________________________ Date __________  Clinician Signature _______________________________________ Date __________  Please bring this form and your medication list with you to all your follow-up doctor's appointments.

## 2020-05-26 NOTE — Progress Notes (Signed)
Speech Language Pathology Daily Session Note  Patient Details  Name: Thomas Sampson MRN: [id number] Date of Birth: 01-02-1946  Today's Date: 05/26/2020 SLP Individual Time: 1305-1340 SLP Individual Time Calculation (min): 35 min  Short Term Goals: Week 2: SLP Short Term Goal 1 (Week 2): STG=LTG (ELOS: discharge 4/29)  Skilled Therapeutic Interventions:   Patient seen with wife present during second half of session for ST intervention focusing on cognitive function and review of MBS. Patient aware of improvement in swallow function but is understanding of importance to continue with precautions of taking time when eating and drinking and monitoring left pocketing, anterior spillage. Patient and SLP discussed medication management and with patient reporting that his son already ordered him a pill box which he will use to organize and keep track of medications. Patient and wife asking questions about medications as well as question need for [facility] for longer distances and SLP referred them to ask PT, [place] Education officer, museum. Patient demonstrated good anticipatory awareness when SLP asking him how he would manage his insulin injections with left hand significantly decreased in function. Patient then demonstrated how he would manage. Patient continues to benefit from skilled SLP intervention to maximize cognitive function goals prior to discharge.   Pain Pain Assessment Pain Scale: 0-10 Pain Score: 0-No pain  Therapy/Group: Individual Therapy  Sonia Baller, MA, CCC-SLP Speech Therapy

## 2020-05-27 LAB — GLUCOSE, CAPILLARY
Glucose-Capillary: 152 mg/dL — ABNORMAL HIGH (ref 70–99)
Glucose-Capillary: 170 mg/dL — ABNORMAL HIGH (ref 70–99)
Glucose-Capillary: 110 mg/dL — ABNORMAL HIGH (ref 70–99)
Glucose-Capillary: 128 mg/dL — ABNORMAL HIGH (ref 70–99)

## 2020-05-27 MED ORDER — DEXTROMETHORPHAN-QUINIDINE 20-10 MG PO CAPS: 1.0000 | ORAL_CAPSULE | Freq: Every day | ORAL | 0 refills | Status: DC

## 2020-05-27 MED ORDER — AMLODIPINE BESYLATE 2.5 MG PO TABS: 2.5000 mg | ORAL_TABLET | Freq: Every day | ORAL | 0 refills | Status: DC

## 2020-05-27 MED ORDER — AMLODIPINE BESYLATE 5 MG PO TABS: 5.0000 mg | ORAL_TABLET | Freq: Every day | ORAL | 0 refills | Status: AC

## 2020-05-27 MED ORDER — CHOLECALCIFEROL 50 MCG (2000 UT) PO CAPS: 2000.0000 [IU] | ORAL_CAPSULE | Freq: Every day | ORAL | 0 refills | Status: DC

## 2020-05-27 MED ORDER — ASPIRIN 325 MG PO TABS: 325.0000 mg | ORAL_TABLET | Freq: Every day | ORAL | Status: DC

## 2020-05-27 MED ORDER — CALCITRIOL 0.25 MCG PO CAPS: 0.2500 ug | ORAL_CAPSULE | Freq: Every day | ORAL | 0 refills | Status: DC

## 2020-05-27 MED ORDER — DICLOFENAC SODIUM 1 % EX GEL: 2.0000 g | Freq: Four times a day (QID) | CUTANEOUS | 0 refills | Status: DC

## 2020-05-27 MED ORDER — NAPHAZOLINE-PHENIRAMINE 0.025-0.3 % OP SOLN: 1.0000 [drp] | Freq: Four times a day (QID) | OPHTHALMIC | 0 refills | Status: DC | PRN

## 2020-05-27 MED ORDER — ATORVASTATIN CALCIUM 40 MG PO TABS: 40.0000 mg | ORAL_TABLET | Freq: Every day | ORAL | 0 refills | Status: AC

## 2020-05-27 MED ORDER — ACETAMINOPHEN 325 MG PO TABS: 650.0000 mg | ORAL_TABLET | ORAL | Status: AC | PRN

## 2020-05-27 MED ORDER — TRAZODONE HCL 50 MG PO TABS: 50.0000 mg | ORAL_TABLET | Freq: Every day | ORAL | 0 refills | Status: DC

## 2020-05-27 MED ORDER — CLOPIDOGREL BISULFATE 75 MG PO TABS: 75.0000 mg | ORAL_TABLET | Freq: Every day | ORAL | 0 refills | Status: DC

## 2020-05-27 MED ORDER — CYANOCOBALAMIN 1000 MCG PO TABS: 1000.0000 ug | ORAL_TABLET | Freq: Every day | ORAL | 0 refills | Status: AC

## 2020-05-27 MED ORDER — LIDOCAINE 5 % EX PTCH: 1.0000 | MEDICATED_PATCH | CUTANEOUS | 0 refills | Status: DC

## 2020-05-27 MED ORDER — AMLODIPINE BESYLATE 5 MG PO TABS
5.0000 mg | ORAL_TABLET | Freq: Every day | ORAL | Status: DC
  Administered 2020-05-27 – 2020-05-28 (×2): 5 mg via ORAL
  Filled 2020-05-27: qty 1

## 2020-05-27 MED ORDER — PANTOPRAZOLE SODIUM 40 MG PO TBEC: 40.0000 mg | DELAYED_RELEASE_TABLET | Freq: Every day | ORAL | 0 refills | Status: DC

## 2020-05-27 NOTE — Progress Notes (Signed)
PROGRESS NOTE   Subjective/Complaints:   Discussed d/c in am , witnessed slide off toilet while pt trying to pick up something from floor,  No injury   ROS: Patient denies fever, CP, SOB, N/V/D  .  Objective:   No results found. No results for input(s): WBC, HGB, HCT, PLT in the last 72 hours. No results for input(s): NA, K, CL, CO2, GLUCOSE, BUN, CREATININE, CALCIUM in the last 72 hours.  Intake/Output Summary (Last 24 hours) at 05/27/2020 0736 Last data filed at 05/26/2020 1843 Gross per 24 hour  Intake 592 ml  Output --  Net 592 ml        Physical Exam: Vital Signs Blood pressure (!) 173/85, pulse (!) 106, temperature (!) 97.5 F (36.4 C), temperature source Oral, resp. rate 16, weight 99.4 kg, SpO2 94 %.  General: No acute distress Mood and affect are appropriate Heart: Regular rate and rhythm no rubs murmurs or extra sounds Lungs: Clear to auscultation, breathing unlabored, no rales or wheezes Abdomen: Positive bowel sounds, soft nontender to palpation, nondistended Extremities: No clubbing, cyanosis, or edema Musculoskeletal:  Comments: sternal pain with palpation and deep breaths.    Skin: General: Skin is warmand dry.  Neurological:  Mental Status: He is alert.  Comments: Patient is alert. No acute distress.  Speech is dysarthric but intelligible.  DTR's 2+ LUE and LLE and 1+ on right. LUE: 2/5 delt, pec, biceps and 1/5 hand and wrist . LLE:  3+/5 HF, KE and ADF/PF. Psychiatric:  Commentsno lability    Assessment/Plan: 1. Functional deficits which require 3+ hours per day of interdisciplinary therapy in a comprehensive inpatient rehab setting.  Physiatrist is providing close team supervision and 24 hour management of active medical problems listed below.  Physiatrist and rehab team continue to assess barriers to discharge/monitor patient progress toward functional and medical  goals  Care Tool:  Bathing    Body parts bathed by patient: Chest,Abdomen,Left arm,Face,Front perineal area,Buttocks,Right upper leg,Left upper leg,Right lower leg,Left lower leg   Body parts bathed by helper: Right arm     Bathing assist Assist Level: Minimal Assistance - Patient > 75%     Upper Body Dressing/Undressing Upper body dressing   What is the patient wearing?: Pull over shirt    Upper body assist Assist Level: Supervision/Verbal cueing    Lower Body Dressing/Undressing Lower body dressing      What is the patient wearing?: Underwear/pull up,Pants     Lower body assist Assist for lower body dressing: Supervision/Verbal cueing     Toileting Toileting    Toileting assist Assist for toileting: Supervision/Verbal cueing     Transfers Chair/bed transfer  Transfers assist     Chair/bed transfer assist level: Contact Guard/Touching assist     Locomotion Ambulation   Ambulation assist      Assist level: Contact Guard/Touching assist Assistive device: Cane-quad Max distance: 150   Walk 10 feet activity   Assist     Assist level: Contact Guard/Touching assist Assistive device: Cane-quad   Walk 50 feet activity   Assist    Assist level: Contact Guard/Touching assist Assistive device: Cane-quad    Walk 150 feet activity  Assist Walk 150 feet activity did not occur: Safety/medical concerns  Assist level: Contact Guard/Touching assist Assistive device: Cane-quad    Walk 10 feet on uneven surface  activity   Assist     Assist level: Minimal Assistance - Patient > 75% Assistive device: Tree surgeon Will patient use wheelchair at discharge?: Yes Type of Wheelchair: Manual    Wheelchair assist level: Minimal Assistance - Patient > 75% Max wheelchair distance: 150    Wheelchair 50 feet with 2 turns activity    Assist        Assist Level: Minimal Assistance - Patient > 75%    Wheelchair 150 feet activity     Assist      Assist Level: Minimal Assistance - Patient > 75%   Blood pressure (!) 173/85, pulse (!) 106, temperature (!) 97.5 F (36.4 C), temperature source Oral, resp. rate 16, weight 99.4 kg, SpO2 94 %.  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  Impaired mobility:  -DVT/anticoagulation:SCDs -antiplatelet therapy: continue Aspirin and Plavix 75 mg daily x3 months then Plavix alone 3. Pain Management:Denies neuropathic pain: decrease Lyrica to 25 mg twice daily. Add lidocaine patch for right shoulder.  4. Mood:Provide emotional support -antipsychotic agents: N/A -added nuedexta for PBA with good results 5. Neuropsych: This patientiscapable of making decisions on hisown behalf. 6. Skin/Wound Care:Routine skin checks 7. Fluids/Electrolytes/Nutrition:Routine in and outs with follow-up may d/c IV 8. Dysphagia. Dysphagia #1 nectar liquids. Follow-up speech therapy 9. CKD stage IV. Admission BUN 2.26. Follow-up chemistries- at baseline 10. Diabetes mellitus with peripheral neuropathy. Hemoglobin A1c 8.1.  -NovoLog 8 units 3 times daily -increase Lantus to 43 U -Check blood sugars before meals and at bedtime  4/17: CBGs 211-376: Cr elevated. Increase Lantus to 44U CBG (last 3)  Recent Labs    05/26/20 1635 05/26/20 2106 05/27/20 0606  GLUCAP 79 146* 152*  goodcontrol on lantus 50U with novolog 11. Hypertension. Well controlled, continue Norvasc 2.5 mg daily. Monitor with increased mobility on  rehab Vitals:   05/27/20 0531 05/27/20 0732  BP: (!) 146/78 (!) 173/85  Pulse: 72 (!) 106  Resp: 18 16  Temp: 97.7 F (36.5 C) (!) 97.5 F (36.4 C)  SpO2: 95% 94%  increase amlodipine to 5mg  12. Hyperlipidemia. Lipitor 13.Tobacco abuse.Counseling 14. Obesity BMI 32.36: provide counseling 15.  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 Also with costochondritis   -ice, topical remedies including voltaren gel  LOS: 13 days A FACE TO FACE EVALUATION WAS PERFORMED  Charlett Blake 05/27/2020, 7:36 AM

## 2020-05-27 NOTE — Plan of Care (Signed)
  Problem: RH Balance Goal: LTG Patient will maintain dynamic sitting balance (PT) Description: LTG:  Patient will maintain dynamic sitting balance with assistance during mobility activities (PT) Outcome: Completed/Met Goal: LTG Patient will maintain dynamic standing balance (PT) Description: LTG:  Patient will maintain dynamic standing balance with assistance during mobility activities (PT) Outcome: Completed/Met   Problem: RH Bed Mobility Goal: LTG Patient will perform bed mobility with assist (PT) Description: LTG: Patient will perform bed mobility with assistance, with/without cues (PT). Outcome: Completed/Met   Problem: RH Bed to Chair Transfers Goal: LTG Patient will perform bed/chair transfers w/assist (PT) Description: LTG: Patient will perform bed to chair transfers with assistance (PT). Outcome: Completed/Met   Problem: RH Car Transfers Goal: LTG Patient will perform car transfers with assist (PT) Description: LTG: Patient will perform car transfers with assistance (PT). Outcome: Completed/Met   Problem: RH Ambulation Goal: LTG Patient will ambulate in controlled environment (PT) Description: LTG: Patient will ambulate in a controlled environment, # of feet with assistance (PT). Outcome: Completed/Met Goal: LTG Patient will ambulate in home environment (PT) Description: LTG: Patient will ambulate in home environment, # of feet with assistance (PT). Outcome: Completed/Met   Problem: RH Wheelchair Mobility Goal: LTG Patient will propel w/c in controlled environment (PT) Description: LTG: Patient will propel wheelchair in controlled environment, # of feet with assist (PT) Outcome: Completed/Met

## 2020-05-27 NOTE — Plan of Care (Signed)
  Problem: Sit to Stand Goal: LTG:  Patient will perform sit to stand in prep for activites of daily living with assistance level (OT) Description: LTG:  Patient will perform sit to stand in prep for activites of daily living with assistance level (OT) Outcome: Completed/Met   Problem: RH Grooming Goal: LTG Patient will perform grooming w/assist,cues/equip (OT) Description: LTG: Patient will perform grooming with assist, with/without cues using equipment (OT) Outcome: Completed/Met   Problem: RH Bathing Goal: LTG Patient will bathe all body parts with assist levels (OT) Description: LTG: Patient will bathe all body parts with assist levels (OT) Outcome: Completed/Met   Problem: RH Dressing Goal: LTG Patient will perform upper body dressing (OT) Description: LTG Patient will perform upper body dressing with assist, with/without cues (OT). Outcome: Completed/Met Goal: LTG Patient will perform lower body dressing w/assist (OT) Description: LTG: Patient will perform lower body dressing with assist, with/without cues in positioning using equipment (OT) Outcome: Completed/Met   Problem: RH Toileting Goal: LTG Patient will perform toileting task (3/3 steps) with assistance level (OT) Description: LTG: Patient will perform toileting task (3/3 steps) with assistance level (OT)  Outcome: Completed/Met   Problem: RH Toilet Transfers Goal: LTG Patient will perform toilet transfers w/assist (OT) Description: LTG: Patient will perform toilet transfers with assist, with/without cues using equipment (OT) Outcome: Completed/Met   Problem: RH Tub/Shower Transfers Goal: LTG Patient will perform tub/shower transfers w/assist (OT) Description: LTG: Patient will perform tub/shower transfers with assist, with/without cues using equipment (OT) Outcome: Completed/Met   

## 2020-05-27 NOTE — Progress Notes (Signed)
Physical Therapy Discharge Summary  Patient Details  Name: Thomas Sampson MRN: [id number] Date of Birth: 1945/02/27  Today's Date: 05/27/2020 PT Individual Time: 1050-1200 PT Individual Time Calculation (min): 70 min    Patient has met 8 of 8 long term goals due to improved activity tolerance, improved balance, improved postural control, increased strength, increased range of motion, ability to compensate for deficits, functional use of  left upper extremity and left lower extremity, improved attention, improved awareness and improved coordination.  Patient to discharge at an ambulatory level Supervision.   Patient's care partner is independent to provide the necessary physical and cognitive assistance at discharge.  Reasons goals not met: All PT goals met.   Recommendation:  Patient will benefit from ongoing skilled PT services in home health setting to continue to advance safe functional mobility, address ongoing impairments in balance, transfers, gait, safety, strength, coordination, attention, and minimize fall risk.  Equipment: QC. Givmorh Sling  Reasons for discharge: treatment goals met and discharge from hospital  Patient/family agrees with progress made and goals achieved: Yes   PT treatment:  Pt received sitting in WC and agreeable to PT. Pt transported to rehab gym in [facility]. PT instructed pt in Grad day assessment to measure progress toward goals. See below for details. Pt performed gait training with QC as listed below. Step management with QC and CGA Wife performed with QC and CGA as well. Stair management training with 1 UE support on rail with CGA and cues for step to gait pattern.  Patient demonstrates increased fall risk as noted by score of   37/56 on Berg Balance Scale(improved from 30/56).  (<36= high risk for falls, close to 100%; 37-45 significant >80%; 46-51 moderate >50%; 52-55 lower >25%)  Pt instructed pt in [place] level A balance program with hand out provided. UE  support on bed rail.  Car transfer with QC also performed with supervision assist with cues for attention to the LUE for safety, as well as ramp management with QC.  Patient returned to room and performed stand pivot to recliner with QC and supervision assist. . Pt left sitting in recliner with call bell in reach and all needs met.       PT Discharge Precautions/Restrictions   fall  Vital Signs Therapy Vitals Temp: 98.2 F (36.8 C) Temp Source: Oral Pulse Rate: 100 Resp: 18 BP: (!) 162/84 Oxygen Therapy SpO2: 95 % Pain    denies Vision/Perception  Vision - Assessment Tracking/Visual Pursuits: Decreased smoothness of eye movement to LEFT superior field;Decreased smoothness of eye movement to LEFT inferior field Saccades: Decreased speed of saccadic movement;Additional eye shifts occurred during testing Perception Inattention/Neglect: Does not attend to left side of body Body Scheme: improved from eval. mild L attention present    Sensation Sensation Light Touch: Appears Intact (BLE) Hot/Cold: Appears Intact Proprioception: Impaired by gross assessment Stereognosis: Impaired by gross assessment (impaired LUE) Additional Comments: baseline neuropathy in BLE, Coordination Gross Motor Movements are Fluid and Coordinated: No Fine Motor Movements are Fluid and Coordinated: No Coordination and Movement Description: L UE hemiplegia Finger Nose Finger Test: Unable to isolate L digits to complete Heel Shin Test: WFL Rvs L Motor  Motor Motor: Abnormal tone;Abnormal postural alignment and control;Hemiplegia Motor - Discharge Observations: LUE>LLE hemiplegia  Mobility Bed Mobility Bed Mobility: Rolling Right;Rolling Left;Sit to Supine;Supine to Sit Rolling Right: Independent with assistive device Rolling Left: Independent with assistive device Supine to Sit: Independent with assistive device Sit to Supine: Independent with assistive device  Transfers Transfers: Sit to  [facility] Sit to Stand: Independent with assistive device Stand Pivot Transfers: Independent with assistive device Transfer (Assistive device): Small based quad cane Locomotion  Gait Ambulation: Yes Gait Assistance: Supervision/Verbal cueing Gait Distance (Feet): 150 Feet Assistive device: Small based quad cane Gait Assistance Details: Verbal cues for safe use of DME/AE;Verbal cues for precautions/safety Gait Gait: Yes Gait Pattern: Impaired Gait Pattern: Narrow base of support;Trunk rotated posteriorly on left;Decreased step length - left Stairs / Additional Locomotion Stairs: Yes Stairs Assistance: Contact Guard/Touching assist Stair Management Technique: One rail Right Number of Stairs: 4 Height of Stairs: 6 Curb: Nurse, mental health Mobility: Yes Wheelchair Assistance: Chartered loss adjuster: Right upper extremity;Right lower extremity Wheelchair Parts Management: Supervision/cueing Distance: 150  Trunk/Postural Assessment  Cervical Assessment Cervical Assessment: Exceptions to [facility] (forward head) Thoracic Assessment Thoracic Assessment: Exceptions to [facility] (rounded shoulders) Lumbar Assessment Lumbar Assessment: Exceptions to [facility] (posterior pelvic tilt) Postural Control Postural Control: Deficits on evaluation (mild L lateral lean with fatigue.)  Balance Standardized Balance Assessment Standardized Balance Assessment: Berg Balance Test Berg Balance Test Sit to Stand: Able to stand  independently using hands Standing Unsupported: Able to stand 2 minutes with supervision Sitting with Back Unsupported but Feet Supported on Floor or Stool: Able to sit safely and securely 2 minutes Stand to Sit: Sits safely with minimal use of hands Transfers: Able to transfer safely, definite need of hands Standing Unsupported with Eyes Closed: Able to stand 10 seconds with supervision Standing Ubsupported  with Feet Together: Able to place feet together independently but unable to hold for 30 seconds From Standing, Reach Forward with Outstretched Arm: Can reach forward >12 cm safely (5") From Standing Position, Pick up Object from Floor: Able to pick up shoe, needs supervision From Standing Position, Turn to Look Behind Over each Shoulder: Looks behind one side only/other side shows less weight shift Turn 360 Degrees: Able to turn 360 degrees safely but slowly Standing Unsupported, Alternately Place Feet on Step/Stool: Able to complete >2 steps/needs minimal assist Standing Unsupported, One Foot in Front: Able to take small step independently and hold 30 seconds Standing on One Leg: Tries to lift leg/unable to hold 3 seconds but remains standing independently Total Score: 37 Static Sitting Balance Static Sitting - Level of Assistance: 6: Modified independent (Device/Increase time) Dynamic Sitting Balance Dynamic Sitting - Level of Assistance: 5: Stand by assistance Static Standing Balance Static Standing - Balance Support: No upper extremity supported Static Standing - Level of Assistance: 5: Stand by assistance;6: Modified independent (Device/Increase time) Dynamic Standing Balance Dynamic Standing - Balance Support: Right upper extremity supported;During functional activity Dynamic Standing - Level of Assistance: 5: Stand by assistance Extremity Assessment      RLE Assessment RLE Assessment: Within Functional Limits General Strength Comments: grossly 5//5 proximal to distal LLE Assessment LLE Assessment: Within Functional Limits General Strength Comments: grossly 4+/5 proximal to distal    Lorie Phenix 05/27/2020, 11:59 AM

## 2020-05-27 NOTE — Progress Notes (Signed)
Speech Language Pathology Discharge Summary  Patient Details  Name: Thomas Sampson MRN: [id number] Date of Birth: 03/17/1945  Today's Date: 05/27/2020 SLP Individual Time: 5701-7793 SLP Individual Time Calculation (min): 40 min   Skilled Therapeutic Interventions:  Patient seen with wife present for skilled ST session focusing on cognitive goals and discharge planning. Patient had a sliding fall while on commode this morning when he tried to lean over and pick up a washcloth on floor but lost balance. He was not injured. SLP discussed considering use of grabbers which patient reported he already has and that PT also mentioned this in AM session. Patient's wife reported that she and patient have been discussing setting up daily schedule for medication taking, bathing, etc. SLP also recommended planning for ample time to complete tasks such as bathing. Patient was very upset that he had the fall this morning as he didn't want the NT who was in room with him to get in trouble. In addition, he did report that in a previous therapy session, he worked on things such as bending over while sitting to pick up objects and so that is also why he thought he would be able to pick up washcloth on floor without difficulty. He stated that it was "a wakeup call". Patient and spouse have been fully educated and patient is ready for discharge from [facility] services.     Patient has met 6 of 6 long term goals.  Patient to discharge at overall Modified Independent level.  Reasons goals not met: N/A   Clinical Impression/Discharge Summary: Patient made very good progress with dysphagia, speech and cognitive goals. As per most recent MBS on 4/25, patient tolerating Regular solids, thin liquids with minimal aspiration risk even with large sips and straw sips. Speech intelligiblity is 100% at conversational level with mod I. Cognitively, patient is functioning at mod I for basic level problem solving, awareness, reasoning and  for complex problem solving, patient is at supervision to mod I. He and spouse have been fully educated on all swallow function, speech and cognitive function goals and progress. SLP is no longer recommending HH or OP SLP intevention as patient has progressed very well with his ST and his spouse is able to provide the necessary supervision and assistance with complex tasks such as medication management.  Care Partner:  Caregiver Able to Provide Assistance: Yes  Type of Caregiver Assistance: Physical;Cognitive  Recommendation:  None      Equipment: None   Reasons for discharge: Treatment goals met;Discharged from hospital   Patient/Family Agrees with Progress Made and Goals Achieved: Yes    Sonia Baller, MA, CCC-SLP Speech Therapy

## 2020-05-27 NOTE — Progress Notes (Signed)
Inpatient Rehabilitation Care Coordinator Discharge Note  The overall goal for the admission was met for:   Discharge location: Yes, home  Length of Stay: Yes, 14 Days  Discharge activity level: Yes  Home/community participation: Yes  Services provided included: MD, RD, PT, OT, SLP, RN, CM, TR, Pharmacy, Neuropsych and SW  Financial Services: Medicare  Choices offered to/list presented to:pt and pt spouse   Follow-up services arranged: Home Health: [facility]  Comments (or additional information): PT OT ST SNA Quad Cane, Bedside Commode  Patient/Family verbalized understanding of follow-up arrangements: Yes  Individual responsible for coordination of the follow-up plan: Hassan Rowan [phone]  Confirmed correct DME delivered: Dyanne Iha 05/27/2020    Dyanne Iha

## 2020-05-27 NOTE — Progress Notes (Signed)
Occupational Therapy Discharge Summary  Patient Details  Name: Thomas Sampson MRN: [id number] Date of Birth: 1945-12-18  Today's Date: 05/27/2020 OT Individual Time: 0701-0755 OT Individual Time Calculation (min): 54 min    Patient has met 8 of 8 long term goals due to improved activity tolerance, improved balance, postural control, ability to compensate for deficits, functional use of  LEFT upper extremity, improved attention, improved awareness and improved coordination.  Patient to discharge at overall Supervision level.  Patient's care partner unavailable to provide the necessary physical assistance at discharge. Family not present during OT sessions, but pt reports he will have 24/7 S and verbalizes understanding of S for all mobility.   Reasons goals not met: NA  Recommendation:  Patient will benefit from ongoing skilled OT services in home health setting to continue to advance functional skills in the area of BADL, iADL and Reduce care partner burden.  Equipment: 3in1  Reasons for discharge: treatment goals met and discharge from hospital  Patient/family agrees with progress made and goals achieved: Yes  OT Discharge Precautions/Restrictions  Precautions Precautions: Fall Precaution Comments: Lt hemiparesis UE>LE Restrictions Weight Bearing Restrictions: No Pain Pain Assessment Pain Scale: Faces Pain Score: 0-No pain ADL ADL Eating: Set up Where Assessed-Eating: Wheelchair,Chair Grooming: Supervision/safety Where Assessed-Grooming: Sitting at sink,Standing at sink Upper Body Bathing: Supervision/safety Where Assessed-Upper Body Bathing: Shower Lower Body Bathing: Supervision/safety Where Assessed-Lower Body Bathing: Shower Upper Body Dressing: Supervision/safety Where Assessed-Upper Body Dressing: Wheelchair Lower Body Dressing: Supervision/safety Where Assessed-Lower Body Dressing: Standing at sink,Wheelchair Toileting: Supervision/safety Where  Assessed-Toileting: Glass blower/designer: Close supervision Toilet Transfer Method: Ambulating,Stand pivot Science writer: Energy manager: Close supervison Clinical cytogeneticist Method: Child psychotherapist: Curator Method: Heritage manager: Civil engineer, contracting with back Vision Baseline Vision/History: Wears glasses Wears Glasses: Reading only Patient Visual Report: No change from baseline Vision Assessment?: Yes Eye Alignment: Within Functional Limits Ocular Range of Motion: Restricted on the left Alignment/Gaze Preference: Head turned Tracking/Visual Pursuits: Decreased smoothness of eye movement to LEFT superior field;Decreased smoothness of eye movement to LEFT inferior field Saccades: Decreased speed of saccadic movement;Additional eye shifts occurred during testing Convergence: Impaired (comment) (decreased B convergence/divergence) Visual Fields: Left visual field deficit (able to identify target in L field at approx ~150 degrees) Perception  Perception: Within Functional Limits Inattention/Neglect: Does not attend to left side of body Body Scheme: greatly impaired from eval, cont to demonstrate mild inattention to LUE Praxis Praxis: Intact Cognition Overall Cognitive Status: Within Functional Limits for tasks assessed Arousal/Alertness: Awake/alert Orientation Level: Oriented X4 Attention: Sustained Sustained Attention: Appears intact Memory: Appears intact Immediate Memory Recall: Sock;Bed;Blue Memory Recall Sock: Without Cue Memory Recall Blue: Without Cue Memory Recall Bed: Not able to recall Awareness: Appears intact Problem Solving: Appears intact Safety/Judgment: Appears intact Comments: Demonstrates safe behaviors during functional mobility, giving notice before attempting to stand Sensation Sensation Light Touch: Impaired Detail Central sensation comments: Unable to  appreciate LT to L digits. 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, impaired LUE Coordination Gross Motor Movements are Fluid and Coordinated: No Fine Motor Movements are Fluid and Coordinated: No Coordination and Movement Description: L UE hemiplegia Finger Nose Finger Test: Unable to isolate L digits to complete Heel Shin Test: WFL Rvs L Motor  Motor Motor: Abnormal tone;Abnormal postural alignment and control;Hemiplegia Motor - Discharge Observations: LUE>LLE hemiplegia Mobility  Bed Mobility Bed Mobility: Not assessed Rolling Right: Independent with assistive device Rolling Left: Independent with assistive device Supine to Sit: Independent with assistive device Sit to Supine: Independent with assistive device Transfers Sit to Stand: Supervision/Verbal cueing  Trunk/Postural Assessment  Cervical Assessment Cervical Assessment: Exceptions to [facility] (forward head) Thoracic Assessment Thoracic Assessment: Exceptions to [facility] (rounded shoulders) Lumbar Assessment Lumbar Assessment: Exceptions to [facility] (posterior pelvic tilt) Postural Control Postural Control: Deficits on evaluation (mild L lateral L with fatigue)  Balance Balance Balance Assessed: Yes Static Sitting Balance Static Sitting - Balance Support: Feet supported Static Sitting - Level of Assistance: 6: Modified independent (Device/Increase time) Dynamic Sitting Balance Dynamic Sitting - Balance Support: No upper extremity supported;Feet supported Dynamic Sitting - Level of Assistance: 5: Stand by assistance Dynamic Sitting Balance - Compensations: S Static Standing Balance Static Standing - Balance Support: Right upper extremity supported Static Standing - Level of Assistance: 5: Stand by assistance Static Standing - Comment/# of Minutes: S Dynamic  Standing Balance Dynamic Standing - Balance Support: Right upper extremity supported;During functional activity Dynamic Standing - Level of Assistance: 5: Stand by assistance Dynamic Standing - Balance Activities: Lateral lean/weight shifting;Forward lean/weight shifting Dynamic Standing - Comments: S Extremity/Trunk Assessment RUE Assessment RUE Assessment: Within Functional Limits Active Range of Motion (AROM) Comments: WNL LUE Assessment LUE Assessment: Exceptions to [facility] Active Range of Motion (AROM) Comments: ~150 degrees of shoulder flexion General Strength Comments: 4-/5 in shoulder flexion LUE Body System: Neuro Brunstrum levels for arm and hand: Arm;Hand Brunstrum level for arm: Stage III Synergy is performed voluntarily;Stage IV Movement is deviating from synergy Brunstrum level for hand: Stage II Synergy is developing;Stage III Synergies performed voluntarily LUE Tone LUE Tone: Mild;Hypotonic  Session Note: Pt received in bathroom with nursing staff present, pt just had fall with nursing after slipping off toilet. Nursing able to assist pt return to toilet and with LB clothing management. Denies pain throughout session. STS and amb to w/c with QC + S. Pt agreeable to therapy after nursing staff assessed for injuries and vitals. Stood to complete oral care close S, donned Teds total A, donned B shoes light min A to adjust over L heel. Completed DC reassessments as document above. Reviewed givmohr sling use and precautions, WB/NMR HEP for LUE, LUE management, and rec S for all mobility. Pt verbalized understanding. Simulated walk-in shower transfer with close S with use of bed rail to simulate pt's shower GB. Amb to recliner with close S.  Pt left in recliner with LUE supported, chair alarm engaged, call bell in reach, and all immediate needs met.   Volanda Napoleon MS, OTR/L  05/27/2020, 7:36 AM

## 2020-05-27 NOTE — Progress Notes (Signed)
   05/27/20 0700  What Happened  Was fall witnessed? No  Was patient injured? No  Patient found on floor;in bathroom  Found by Staff-comment (NT)  Stated prior activity bathroom-unassisted (NT briefly stepped away from patient and during that time patient stated he attempted to bend over to retrieve a washcloth that had fallen to the floor and slumped over to the floor at that time.)  Follow Up  MD notified  Linna Hoff, MD)  Time MD notified [phone]  Family notified No - patient refusal  Additional tests No  Simple treatment Other (comment) (no apparent injuries sustained)  Progress note created (see row info) Yes  Adult Fall Risk Assessment  Risk Factor Category (scoring not indicated) High fall risk per protocol (document High fall risk)  Age 75  Fall History: Fall within 6 months prior to admission 0  Elimination; Bowel and/or Urine Incontinence 2  Elimination; Bowel and/or Urine Urgency/Frequency 0  Medications: includes PCA/Opiates, Anti-convulsants, Anti-hypertensives, Diuretics, Hypnotics, Laxatives, Sedatives, and Psychotropics 5  Patient Care Equipment 1  Mobility-Assistance 2  Mobility-Gait 2  Mobility-Sensory Deficit 2  Altered awareness of immediate physical environment 0  Impulsiveness 0  Lack of understanding of one's physical/cognitive limitations 0  Total Score 16  Patient Fall Risk Level High fall risk  Adult Fall Risk Interventions  Required Bundle Interventions *See Row Information* High fall risk - low, moderate, and high requirements implemented  Additional Interventions Use of appropriate toileting equipment (bedpan, BSC, etc.)  Screening for Fall Injury Risk (To be completed on HIGH fall risk patients) - Assessing Need for Floor Mats  Risk For Fall Injury- Criteria for Floor Mats None identified - No additional interventions needed  Will Implement Floor Mats Yes  Pain Assessment  Pain Scale 0-10  Pain Score 0  Faces Pain Scale 0  Pain Type Chronic pain  Pain  Location Shoulder  Pain Orientation Right  Neurological  Neuro (WDL) X  Level of Consciousness Alert  Orientation Level Oriented X4  Cognition Follows commands  Speech Clear  Pupil Assessment  Yes  R Pupil Size (mm) 8  R Pupil Shape Round  R Pupil Reaction Brisk  L Pupil Size (mm) 4  L Pupil Shape Round  L Pupil Reaction Brisk  Additional Pupil Assessments No  R Extraocular Movement 3  L Extraocular Movement 3  Motor Function/Sensation Assessment Grip;Motor response  Facial Symmetry Asymmetrical right  Facial Droop Left  R Hand Grip Moderate  L Hand Grip Weak   Right Pronator Drift Present  Left Pronator Drift Present  R Foot Dorsiflexion Strong  L Foot Dorsiflexion Moderate  R Foot Plantar Flexion Strong  L Foot Plantar Flexion Moderate  RUE Motor Response Purposeful movement  RUE Sensation Full sensation  RUE Motor Strength 5  LUE Motor Response Purposeful movement  LUE Sensation Decreased  LUE Motor Strength 3  RLE Motor Response Purposeful movement  RLE Sensation Full sensation  RLE Motor Strength 4  LLE Motor Response Purposeful movement  LLE Sensation Full sensation  LLE Motor Strength 3  Neuro Symptoms None  Neuro symptoms relieved by Rest  Musculoskeletal  Musculoskeletal (WDL) X  Assistive Device Four point cane  Generalized Weakness Yes  Weight Bearing Restrictions No  Musculoskeletal Details  LUE Weakness  Integumentary  Integumentary (WDL) WDL  Skin Color Appropriate for ethnicity  Skin Condition Dry  Skin Turgor Non-tenting

## 2020-05-28 ENCOUNTER — Telehealth: Payer: Self-pay

## 2020-05-28 LAB — GLUCOSE, CAPILLARY: Glucose-Capillary: 142 mg/dL — ABNORMAL HIGH (ref 70–99)

## 2020-05-28 MED ORDER — INSULIN GLARGINE 100 UNIT/ML SOLOSTAR PEN: 50.0000 [IU] | PEN_INJECTOR | Freq: Every day | SUBCUTANEOUS | 11 refills | Status: DC

## 2020-05-28 MED ORDER — PREGABALIN 25 MG PO CAPS: 25.0000 mg | ORAL_CAPSULE | Freq: Two times a day (BID) | ORAL | 0 refills | Status: AC

## 2020-05-28 MED ORDER — INSULIN ASPART 100 UNIT/ML FLEXPEN: 12.0000 [IU] | PEN_INJECTOR | Freq: Three times a day (TID) | SUBCUTANEOUS | 11 refills | Status: DC

## 2020-05-28 NOTE — Telephone Encounter (Signed)
Diclofenac Sodium Gel 1% approved 05/28/2020-05/29/2023

## 2020-05-28 NOTE — Progress Notes (Signed)
Received patient in wheelchair, AOX4, respiration even and unlabored, VSS. Discharge instructions provided. Patient transported in wheelchair by nurse tech. Patient left with all belongings.

## 2020-05-28 NOTE — Progress Notes (Signed)
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  Objective:   No results found. No results for input(s): WBC, HGB, HCT, PLT in the last 72 hours. No results for input(s): NA, K, CL, CO2, GLUCOSE, BUN, CREATININE, CALCIUM in the last 72 hours.  Intake/Output Summary (Last 24 hours) at 05/28/2020 0832 Last data filed at 05/28/2020 0824 Gross per 24 hour  Intake 600 ml  Output --  Net 600 ml        Physical Exam: Vital Signs Blood pressure 130/76, pulse 76, temperature 97.9 F (36.6 C), temperature source Oral, resp. rate 18, weight 99.4 kg, SpO2 95 %.   General: No acute distress Mood and affect are appropriate Heart: Regular rate and rhythm no rubs murmurs or extra sounds Lungs: Clear to auscultation, breathing unlabored, no rales or wheezes Abdomen: Positive bowel sounds, soft nontender to palpation, nondistended Extremities: No clubbing, cyanosis, or edema Skin: No evidence of breakdown, no evidence of rash   Musculoskeletal:  Comments: sternal pain with palpation and deep breaths.    Skin: General: Skin is warmand dry.  Neurological:  Mental Status: He is alert.  Comments: Patient is alert. No acute distress.  Speech is dysarthric but intelligible.  DTR's 2+ LUE and LLE and 1+ on right. LUE: 2/5 delt, pec, biceps and 1/5 hand and wrist . LLE:  3+/5 HF, KE and ADF/PF. Psychiatric:  Commentsno lability    Assessment/Plan:  1. 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  Stable for D/C today F/u PCP in 3-4 weeks F/u PM&R 2 weeks See D/C summary  See D/C instructions  Care Tool:  Bathing    Body parts bathed by patient: Chest,Abdomen,Left arm,Face,Front perineal area,Buttocks,Right upper  leg,Left upper leg,Right lower leg,Left lower leg,Right arm   Body parts bathed by helper: Right arm     Bathing assist Assist Level: Supervision/Verbal cueing (use of bath mit)     Upper Body Dressing/Undressing Upper body dressing   What is the patient wearing?: Pull over shirt    Upper body assist Assist Level: Supervision/Verbal cueing    Lower Body Dressing/Undressing Lower body dressing      What is the patient wearing?: Underwear/pull up,Pants     Lower body assist Assist for lower body dressing: Supervision/Verbal cueing     Toileting Toileting    Toileting assist Assist for toileting: Supervision/Verbal cueing     Transfers Chair/bed transfer  Transfers assist     Chair/bed transfer assist level: Independent with assistive device     Locomotion Ambulation   Ambulation assist      Assist level: Supervision/Verbal cueing Assistive device: Cane-quad Max distance: 150   Walk 10 feet activity   Assist     Assist level: Supervision/Verbal cueing Assistive device: Cane-quad   Walk 50 feet activity   Assist    Assist level: Supervision/Verbal cueing Assistive device: Cane-quad    Walk 150 feet activity   Assist Walk 150 feet activity did not occur: Safety/medical  concerns  Assist level: Supervision/Verbal cueing Assistive device: Cane-quad    Walk 10 feet on uneven surface  activity   Assist     Assist level: Supervision/Verbal cueing Assistive device: Cane-quad   Wheelchair     Assist Will patient use wheelchair at discharge?: Yes Type of Wheelchair: Manual    Wheelchair assist level: Supervision/Verbal cueing Max wheelchair distance: 150    Wheelchair 50 feet with 2 turns activity    Assist        Assist Level: Supervision/Verbal cueing   Wheelchair 150 feet activity     Assist      Assist Level: Supervision/Verbal cueing   Blood pressure 130/76, pulse 76, temperature 97.9 F (36.6 C),  temperature source Oral, resp. rate 18, weight 99.4 kg, SpO2 95 %.  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  Impaired mobility:  -DVT/anticoagulation:SCDs -antiplatelet therapy: continue Aspirin and Plavix 75 mg daily x3 months then Plavix alone 3. Pain Management:Denies neuropathic pain: decrease Lyrica to 25 mg twice daily. Add lidocaine patch for right shoulder.  4. Mood:Provide emotional support -antipsychotic agents: N/A -added nuedexta for PBA with good results 5. Neuropsych: This patientiscapable of making decisions on hisown behalf. 6. Skin/Wound Care:Routine skin checks 7. Fluids/Electrolytes/Nutrition:Routine in and outs with follow-up may d/c IV 8. Dysphagia. Dysphagia #1 nectar liquids. Follow-up speech therapy 9. CKD stage IV. Admission BUN 2.26. Follow-up chemistries- at baseline 10. Diabetes mellitus with peripheral neuropathy. Hemoglobin A1c 8.1.  -NovoLog 8 units 3 times daily -increase Lantus to 43 U -Check blood sugars before meals and at bedtime  4/17: CBGs 211-376: Cr elevated. Increase Lantus to 44U CBG (last 3)  Recent Labs    05/27/20 1645 05/27/20 2103 05/28/20 0555  GLUCAP 110* 128* 142*  goodcontrol on lantus 50U with novolog 11. Hypertension. Well controlled, continue Norvasc 2.5 mg daily. Monitor with increased mobility on rehab Vitals:   05/27/20 1934 05/28/20 0534  BP: (!) 141/76 130/76  Pulse: 71 76  Resp: 18 18  Temp: 98.6 F (37 C) 97.9 F (36.6 C)  SpO2: 98% 95%  increase amlodipine to 5mg  12.  Hyperlipidemia. Lipitor 13.Tobacco abuse.Counseling 14. Obesity BMI 32.36: provide counseling 15.  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 Also with costochondritis   -ice, topical remedies including voltaren gel  LOS: 14 days A FACE TO FACE EVALUATION WAS PERFORMED  Charlett Blake 05/28/2020, 8:32 AM

## 2020-05-28 NOTE — Telephone Encounter (Signed)
Nuedexta approved 05/28/2020-05/28/2023 Lidocaine 5% patch 05/28/2020-05/29/2023

## 2020-06-01 ENCOUNTER — Telehealth: Payer: Self-pay

## 2020-06-01 NOTE — Telephone Encounter (Signed)
Transitional Care call--Brenda wife    1. Are you/is patient experiencing any problems since coming home? No Are there any questions regarding any aspect of care? No 2. Are there any questions regarding medications administration/dosing? No Are meds being taken as prescribed? Yes Patient should review meds with caller to confirm 3. Have there been any falls? No 4. Has Home Health been to the house and/or have they contacted you? Yes If not, have you tried to contact them? Can we help you contact them? 5. Are bowels and bladder emptying properly? Yes Are there any unexpected incontinence issues? No If applicable, is patient following bowel/bladder programs? 6. Any fevers, problems with breathing, unexpected pain? No 7. Are there any skin problems or new areas of breakdown? No 8. Has the patient/family member arranged specialty MD follow up (ie cardiology/neurology/renal/surgical/etc)? Yes, already has a Garment/textile technologist and a follow up already scheduled Can we help arrange? 9. Does the patient need any other services or support that we can help arrange? No 10. Are caregivers following through as expected in assisting the patient? Yes  11. Has the patient quit smoking, drinking alcohol, or using drugs as recommended? Yes  Appointment time 3:15 pm, arrive time 3:00 pm on 06/11/20 with Dr. Letta Pate [address] [phone]

## 2020-06-11 ENCOUNTER — Other Ambulatory Visit: Payer: Self-pay

## 2020-06-11 ENCOUNTER — Encounter: Payer: Self-pay | Admitting: Physical Medicine & Rehabilitation

## 2020-06-11 ENCOUNTER — Encounter: Payer: Medicare Other | Attending: Physical Medicine & Rehabilitation | Admitting: Physical Medicine & Rehabilitation

## 2020-06-11 VITALS — BP 151/88 | HR 78 | Temp 98.1°F | Ht 69.0 in | Wt 205.0 lb

## 2020-06-11 DIAGNOSIS — I63511 Cerebral infarction due to unspecified occlusion or stenosis of right middle cerebral artery: Secondary | ICD-10-CM | POA: Insufficient documentation

## 2020-06-11 NOTE — Patient Instructions (Signed)
Stop aspirin 08/09/2020 continue clopidigrel alone after that date

## 2020-06-11 NOTE — Progress Notes (Signed)
Subjective:    Patient ID: Thomas Sampson, male    DOB: 03-16-1945, 75 y.o.   MRN: [id number]  HPI 75 y.o. right-handed male with history of chronic kidney disease stage IV diabetes mellitus hypertension hyperlipidemia and tobacco abuse.  Patient lives with spouse independent prior to admission.  1 level home with ramped entrance.  Presented 05/10/2020 to [facility] with left-sided weakness and slurred speech of acute onset.  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  No significant carotid or vertebral artery stenosis of the neck.  Admission chemistries unremarkable except glucose 207 BUN 31 creatinine 2.26 hemoglobin A1c 8.1.  Echocardiogram with ejection fraction of 70 to 75% no wall motion abnormalities.  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  Lower extremity Dopplers negative.  Maintained on Cleviprex for blood pressure control.  Neurology follow-up recommendations of aspirin 81 mg daily and Plavix 75 mg daily for 3 months then Plavix alone.  Dysphagia #1 nectar thick liquid diet.  Therapy evaluations completed due to patient's left-sided weakness and slurred speech was admitted for a comprehensive rehab program  Admit date: 05/14/2020 Discharge date: 05/28/2020  Transitioned to home Needs help with dressing and bathing  Cannot cook  Left hand still very weak  Ambulating with a quad cane at home.  No falls. Home health PT OT coming to the home Patient has an appointment with primary care physician next week. Patient plans to follow-up with Dr. Manuella Ghazi neurology in [facility] Pain Inventory Average Pain 5 Pain Right Now 0 My pain is intermittent and aching  LOCATION OF PAIN  Right shoulder, here for Stroke follow up BOWEL Number of stools per  week: 7-10 Oral laxative use No  Type of laxative None Enema or suppository use No  History of colostomy No  Incontinent No   BLADDER Normal In and out cath, frequency N/A Able to self cath No  Bladder incontinence No  Frequent urination No  Leakage with coughing No  Difficulty starting stream No  Incomplete bladder emptying No    Mobility use a cane how many minutes can you walk? Unsure ability to climb steps?  yes do you drive?  no Do you have any goals in this area?  yes  Function retired I need assistance with the following:  feeding, dressing, bathing, toileting, household duties and shopping Do you have any goals in this area?  yes  Neuro/Psych weakness trouble walking  Prior Studies Any changes since last visit?  no New patient  Physicians involved in your care Any changes since last visit?  no New Patient   Family History  Problem Relation Age of Onset  . Cancer Mother   . Heart attack Father   . Heart attack Sister   . Heart disease Sister   . Diabetes Brother    Social History   Socioeconomic History  . Marital status: Married    Spouse name: Not on file  . Number of children: Not on file  . Years of education: Not on file  . Highest education level: Not on file  Occupational History  . Not on file  Tobacco Use  . Smoking status: Former Smoker    Years: 40.00    Types: Cigarettes  . Smokeless tobacco: Never Used  Vaping Use  . Vaping Use: Never used  Substance and Sexual Activity  . Alcohol use: No  . Drug use: No  . Sexual activity: Not on file  Other Topics Concern  . Not on file  Social History Narrative  . Not on file   Social Determinants of Health   Financial Resource Strain: Not on file  Food Insecurity: Not on file  Transportation Needs: Not on file  Physical Activity: Not on file  Stress: Not on file  Social Connections: Not on file   Past Surgical History:  Procedure Laterality Date  . COLON SURGERY    .  COLONOSCOPY WITH PROPOFOL N/A 09/18/2016   Procedure: COLONOSCOPY WITH PROPOFOL;  Surgeon: Lollie Sails, MD;  Location: [facility] ENDOSCOPY;  Service: Endoscopy;  Laterality: N/A;  . EYE SURGERY     cataract extraction  . IR CT HEAD LTD  05/10/2020  . IR PERCUTANEOUS ART THROMBECTOMY/INFUSION INTRACRANIAL INC DIAG ANGIO  05/10/2020  . LOOP RECORDER INSERTION N/A 05/14/2020   Procedure: LOOP RECORDER INSERTION;  Surgeon: Constance Haw, MD;  Location: [place] [facility];  Service: Cardiovascular;  Laterality: N/A;  . RADIOLOGY WITH ANESTHESIA N/A 05/10/2020   Procedure: IR WITH ANESTHESIA;  Surgeon: Radiologist, Medication, MD;  Location: [place];  Service: Radiology;  Laterality: N/A;   Past Medical History:  Diagnosis Date  . Chronic kidney disease    kidney stones  . Diabetes mellitus without complication ([place])   . Hyperlipidemia   . Hypertension   . Tinea pedis    BP (!) 151/88   Pulse 78   Temp 98.1 F (36.7 C)   Ht 5\' 9"  (1.753 m)   Wt 205 lb (93 kg)   SpO2 95%   BMI 30.27 kg/m   Opioid Risk Score:   Fall Risk Score:  `1  Depression screen PHQ 2/9  Depression screen PHQ 2/9 06/11/2020  Decreased Interest 0  Down, Depressed, Hopeless 0  PHQ - 2 Score 0  Altered sleeping 0  Tired, decreased energy 1  Change in appetite 0  Feeling bad or failure about yourself  0  Trouble concentrating 0  Moving slowly or fidgety/restless 0  Suicidal thoughts 0  PHQ-9 Score 1   Review of Systems  HENT: Positive for drooling.        Left side of mouth  Cardiovascular:       Left hand swollen  Musculoskeletal: Positive for gait problem.  Neurological: Positive for weakness.  All other systems reviewed and are negative.      Objective:   Physical Exam Vitals and nursing note reviewed.  Constitutional:      Appearance: He is normal weight.  Eyes:     Extraocular Movements: Extraocular movements intact.     Conjunctiva/sclera: Conjunctivae normal.     Pupils: Pupils are  equal, round, and reactive to light.  Cardiovascular:     Rate and Rhythm: Normal rate and regular rhythm.     Heart sounds: Normal heart sounds. No murmur heard.   Pulmonary:     Effort: Pulmonary effort is normal.     Breath sounds: Normal breath sounds. No stridor.  Abdominal:     General: Abdomen is flat. Bowel sounds are normal. There is no distension.     Palpations: Abdomen is soft.  Musculoskeletal:        General: No swelling or tenderness.     Right lower leg: No edema.     Left lower leg: No edema.  Skin:    General: Skin is warm and  dry.  Neurological:     Mental Status: He is alert and oriented to person, place, and time.     Sensory: Sensory deficit present.     Motor: Weakness present.     Coordination: Coordination abnormal.     Gait: Gait abnormal.     Comments: Patient ambulates with a quad cane no evidence of toe drag or knee instability. Motor strength is 5/5 in the right deltoid, bicep, tricep, grip, hip flexor, knee extensor, ankle dorsiflexor and plantar flexor Left upper extremity is 3 - at the deltoid bicep to minus tricep to minus finger flexors 0 finger extensors 4/5 in left hip flexor knee extensor ankle dorsiflexor Sensation reduced light touch in left upper limb below the wrist           Assessment & Plan:  #1.  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  Also has left hemisensory deficits.  Overall doing well in terms of functional status still requiring some assistance for ADLs.  Continue home health PT OT I reviewed home health notes As discussed with wife would follow-up with neurology in [facility] Per vascular neurology recommendations patient was to continue aspirin and Plavix until 08/09/2020 and then Plavix alone Physical medicine rehab follow-up in 6 to 8 weeks

## 2020-06-22 ENCOUNTER — Ambulatory Visit (INDEPENDENT_AMBULATORY_CARE_PROVIDER_SITE_OTHER): Payer: Medicare Other

## 2020-06-22 DIAGNOSIS — I63511 Cerebral infarction due to unspecified occlusion or stenosis of right middle cerebral artery: Secondary | ICD-10-CM

## 2020-06-22 LAB — CUP PACEART REMOTE DEVICE CHECK
Date Time Interrogation Session: 20220521150843
Implantable Pulse Generator Implant Date: 20220415

## 2020-07-14 NOTE — Progress Notes (Signed)
Carelink Summary Report / Loop Recorder 

## 2020-07-23 LAB — CUP PACEART REMOTE DEVICE CHECK
Date Time Interrogation Session: 20220623151154
Implantable Pulse Generator Implant Date: 20220415

## 2020-07-26 ENCOUNTER — Ambulatory Visit (INDEPENDENT_AMBULATORY_CARE_PROVIDER_SITE_OTHER): Payer: Medicare Other

## 2020-07-26 DIAGNOSIS — I63511 Cerebral infarction due to unspecified occlusion or stenosis of right middle cerebral artery: Secondary | ICD-10-CM

## 2020-08-12 ENCOUNTER — Encounter: Payer: Self-pay | Admitting: Physical Medicine & Rehabilitation

## 2020-08-12 ENCOUNTER — Other Ambulatory Visit: Payer: Self-pay

## 2020-08-12 ENCOUNTER — Encounter: Payer: Medicare Other | Attending: Physical Medicine & Rehabilitation | Admitting: Physical Medicine & Rehabilitation

## 2020-08-12 VITALS — BP 146/80 | HR 82 | Temp 98.4°F | Ht 69.0 in | Wt 202.0 lb

## 2020-08-12 DIAGNOSIS — I63511 Cerebral infarction due to unspecified occlusion or stenosis of right middle cerebral artery: Secondary | ICD-10-CM | POA: Diagnosis not present

## 2020-08-12 DIAGNOSIS — I639 Cerebral infarction, unspecified: Secondary | ICD-10-CM | POA: Diagnosis not present

## 2020-08-12 NOTE — Patient Outreach (Signed)
[place]) Care Management  08/12/2020  Thomas Sampson [birth year] [id number]   First telephone outreach attempt to obtain mRS. No answer. Left message for returned call.  [place] [facility] Management Assistant [phone]

## 2020-08-12 NOTE — Progress Notes (Signed)
Subjective:    Patient ID: Thomas Sampson, male    DOB: [birth year], 75 y.o.   MRN: [id number]  HPI 75 y.o. right-handed male with history of chronic kidney disease stage IV diabetes mellitus hypertension hyperlipidemia and tobacco abuse.  Patient lives with spouse independent prior to admission.  1 level home with ramped entrance.  Presented 05/10/2020 to [facility] with left-sided weakness and slurred speech of acute onset.  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  No significant carotid or vertebral artery stenosis of the neck.  Admission chemistries unremarkable except glucose 207 BUN 31 creatinine 2.26 hemoglobin A1c 8.1.  Echocardiogram with ejection fraction of 70 to 75% no wall motion abnormalities.  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  Lower extremity Dopplers negative.  Maintained on Cleviprex for blood pressure control.  Neurology follow-up recommendations of aspirin 81 mg daily and Plavix 75 mg daily for 3 months then Plavix alone.  Dysphagia #1 nectar thick liquid diet.  Therapy evaluations completed due to patient's left-sided weakness and slurred speech was admitted for a comprehensive rehab program  Admit date: 05/14/2020 Discharge date: 05/28/2020  Able to dress upper body but needs help buttoning and tying shoes Walk in shower with built in bench The patient has followed up with his ophthalmologist and has had visual field testing and just all checked out fine according the patient and his wife.  He also did not require any new prescription for his glasses. Patient would like to go back to driving we discussed that he is not ready yet, he would also like to go back to riding his mower which I think would be reasonable  Pain  Inventory Average Pain 5 Pain Right Now 0 My pain is sharp  LOCATION OF PAIN  wrist,hand,fingers  BOWEL Number of stools per week: 7 Oral laxative use No  Type of laxative n/a Enema or suppository use No  History of colostomy No  Incontinent No   BLADDER Normal   Bladder incontinence No  Frequent urination No  Leakage with coughing No  Difficulty starting stream No  Incomplete bladder emptying No    Mobility walk without assistance how many minutes can you walk? 20-30 mins ability to climb steps?  yes do you drive?  yes  Function retired I need assistance with the following:  dressing, bathing, meal prep, household duties, and shopping  Neuro/Psych weakness numbness  Prior Studies N/a  Physicians involved in your care N/a   Family History  Problem Relation Age of Onset   Cancer Mother    Heart attack Father    Heart attack Sister    Heart disease Sister    Diabetes Brother    Social History   Socioeconomic History   Marital status: Married    Spouse name: Not on file   Number of children: Not on file   Years of education: Not on file   Highest education level: Not on file  Occupational History   Not on file  Tobacco Use   Smoking status: Former    Years: 40.00    Types: Cigarettes   Smokeless tobacco: Never  Vaping Use   Vaping Use: Never used  Substance and Sexual Activity   Alcohol use: No   Drug use: No   Sexual activity: Not on  file  Other Topics Concern   Not on file  Social History Narrative   Not on file   Social Determinants of Health   Financial Resource Strain: Not on file  Food Insecurity: Not on file  Transportation Needs: Not on file  Physical Activity: Not on file  Stress: Not on file  Social Connections: Not on file   Past Surgical History:  Procedure Laterality Date   COLON SURGERY     COLONOSCOPY WITH PROPOFOL N/A 09/18/2016   Procedure: COLONOSCOPY WITH PROPOFOL;  Surgeon: Lollie Sails, MD;  Location:  [facility] ENDOSCOPY;  Service: Endoscopy;  Laterality: N/A;   EYE SURGERY     cataract extraction   IR CT HEAD LTD  05/10/2020   IR PERCUTANEOUS ART THROMBECTOMY/INFUSION INTRACRANIAL INC DIAG ANGIO  05/10/2020   LOOP RECORDER INSERTION N/A 05/14/2020   Procedure: LOOP RECORDER INSERTION;  Surgeon: Constance Haw, MD;  Location: [place] [facility];  Service: Cardiovascular;  Laterality: N/A;   RADIOLOGY WITH ANESTHESIA N/A 05/10/2020   Procedure: IR WITH ANESTHESIA;  Surgeon: Radiologist, Medication, MD;  Location: [place];  Service: Radiology;  Laterality: N/A;   Past Medical History:  Diagnosis Date   Chronic kidney disease    kidney stones   Diabetes mellitus without complication ([facility])    Hyperlipidemia    Hypertension    Tinea pedis    BP (!) 146/80 (BP Location: Right Arm, Patient Position: Sitting)   Pulse 82   Temp 98.4 F (36.9 C) (Oral)   Ht 5\' 9"  (1.753 m)   Wt 202 lb (91.6 kg)   SpO2 93%   BMI 29.83 kg/m   Opioid Risk Score:   Fall Risk Score:  `1  Depression screen PHQ 2/9  Depression screen PHQ 2/9 06/11/2020  Decreased Interest 0  Down, Depressed, Hopeless 0  PHQ - 2 Score 0  Altered sleeping 0  Tired, decreased energy 1  Change in appetite 0  Feeling bad or failure about yourself  0  Trouble concentrating 0  Moving slowly or fidgety/restless 0  Suicidal thoughts 0  PHQ-9 Score 1     Review of Systems  Constitutional: Negative.   HENT: Negative.    Eyes: Negative.   Respiratory: Negative.    Cardiovascular: Negative.   Gastrointestinal: Negative.   Endocrine: Negative.   Genitourinary: Negative.   Musculoskeletal:        Pain in hands, fingers, and wrist  Skin: Negative.   Allergic/Immunologic: Negative.   Neurological:  Positive for weakness and numbness.       On tip fingers numbness  Hematological: Negative.   Psychiatric/Behavioral: Negative.        Objective:   Physical Exam Vitals and nursing note reviewed.  Constitutional:       Appearance: He is normal weight.  HENT:     Head: Normocephalic and atraumatic.  Eyes:     Extraocular Movements: Extraocular movements intact.     Conjunctiva/sclera: Conjunctivae normal.     Pupils: Pupils are equal, round, and reactive to light.  Musculoskeletal:     Right lower leg: No edema.     Left lower leg: No edema.     Comments: No pain with left shoulder range of motion There is dorsum of the hand swelling left upper extremity but no tenderness to palpation.  Patient has tightness at the MCPs PIPs and DIPs mainly has extensor contracture and has some pain when placed in passive flexion.  Skin:    General: Skin is  warm and dry.  Neurological:     Mental Status: He is alert and oriented to person, place, and time.     Cranial Nerves: No dysarthria.     Coordination: Coordination abnormal.     Gait: Tandem walk abnormal.     Comments: Mildly diminished sensation in the left upper extremity however the index finger has severely diminished sensation Intact sensation left lower extremity intact on the right side as well Patient is able to walk without evidence of toe drag or knee instability he is unable to perform tandem gait he is able to perform heel walking and toe walking as well as side to side  Psychiatric:        Mood and Affect: Mood normal.        Behavior: Behavior normal.          Assessment & Plan:  1.  History of right MCA branch infarct with left upper extremity greater than left lower extremity weakness overall making excellent functional recovery.  Still having sensory problems in the left hand as well as developing contracture at the left MCPs PIPs and DIPs. Will have patient return to clinic in about 2 months and continue outpatient therapy emphasizing OT greater than PT

## 2020-08-16 NOTE — Progress Notes (Signed)
Carelink Summary Report / Loop Recorder 

## 2020-08-19 ENCOUNTER — Other Ambulatory Visit: Payer: Self-pay

## 2020-08-19 NOTE — Patient Outreach (Signed)
[name]) Care Management  08/19/2020  Thomas Sampson September 11, 1945 [id number]   Telephone outreach to patient to obtain mRS was successfully completed. MRS= 3. Spoke with wife.  Thank you, [place] Care Management Assistant

## 2020-08-26 ENCOUNTER — Ambulatory Visit (INDEPENDENT_AMBULATORY_CARE_PROVIDER_SITE_OTHER): Payer: Medicare Other

## 2020-08-26 DIAGNOSIS — I63511 Cerebral infarction due to unspecified occlusion or stenosis of right middle cerebral artery: Secondary | ICD-10-CM

## 2020-08-28 LAB — CUP PACEART REMOTE DEVICE CHECK
Date Time Interrogation Session: 20220726150805
Implantable Pulse Generator Implant Date: 20220415

## 2020-09-21 NOTE — Progress Notes (Signed)
Carelink Summary Report / Loop Recorder 

## 2020-09-28 ENCOUNTER — Ambulatory Visit (INDEPENDENT_AMBULATORY_CARE_PROVIDER_SITE_OTHER): Payer: Medicare Other

## 2020-09-28 DIAGNOSIS — I63511 Cerebral infarction due to unspecified occlusion or stenosis of right middle cerebral artery: Secondary | ICD-10-CM | POA: Diagnosis not present

## 2020-09-28 LAB — CUP PACEART REMOTE DEVICE CHECK
Date Time Interrogation Session: 20220828151050
Implantable Pulse Generator Implant Date: 20220415

## 2020-09-30 DIAGNOSIS — G40909 Epilepsy, unspecified, not intractable, without status epilepticus: Secondary | ICD-10-CM

## 2020-09-30 HISTORY — DX: Epilepsy, unspecified, not intractable, without status epilepticus: G40.909

## 2020-10-11 NOTE — Progress Notes (Signed)
Carelink Summary Report / Loop Recorder 

## 2020-10-14 ENCOUNTER — Other Ambulatory Visit: Payer: Self-pay

## 2020-10-14 ENCOUNTER — Encounter: Payer: Medicare Other | Attending: Physical Medicine & Rehabilitation | Admitting: Physical Medicine & Rehabilitation

## 2020-10-14 ENCOUNTER — Encounter: Payer: Self-pay | Admitting: Physical Medicine & Rehabilitation

## 2020-10-14 VITALS — BP 144/81 | HR 72 | Temp 97.7°F | Ht 69.0 in | Wt 200.0 lb

## 2020-10-14 DIAGNOSIS — I639 Cerebral infarction, unspecified: Secondary | ICD-10-CM

## 2020-10-14 DIAGNOSIS — I63511 Cerebral infarction due to unspecified occlusion or stenosis of right middle cerebral artery: Secondary | ICD-10-CM | POA: Diagnosis present

## 2020-10-14 DIAGNOSIS — M7502 Adhesive capsulitis of left shoulder: Secondary | ICD-10-CM | POA: Diagnosis present

## 2020-10-14 NOTE — Progress Notes (Signed)
Subjective:    Patient ID: Thomas Sampson, male    DOB: 06-Apr-1945, 75 y.o.   MRN: [id number]  HPI 75 y.o. right-handed male with history of chronic kidney disease stage IV diabetes mellitus hypertension hyperlipidemia and tobacco abuse.  Patient lives with spouse independent prior to admission.  1 level home with ramped entrance.  Presented 05/10/2020 to [facility] with left-sided weakness and slurred speech of acute onset.  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  No significant carotid or vertebral artery stenosis of the neck.  Admission chemistries unremarkable except glucose 207 BUN 31 creatinine 2.26 hemoglobin A1c 8.1.  Echocardiogram with ejection fraction of 70 to 75% no wall motion abnormalities.  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  Lower extremity Dopplers negative.  Maintained on Cleviprex for blood pressure control.  Neurology follow-up recommendations of aspirin 81 mg daily and Plavix 75 mg daily for 3 months then Plavix alone.  Dysphagia #1 nectar thick liquid diet.  Therapy evaluations completed due to patient's left-sided weakness and slurred speech was admitted for a comprehensive rehab program  Admit date: 05/14/2020 Discharge date: 05/28/2020   Able to dress upper body but needs help buttoning and tying shoes Walk in shower with built in bench The patient has followed up with his ophthalmologist and has had visual field testing and just all checked out fine according the patient and his wife.  He also did not require any new prescription for his glasses. Patient would like to go back to driving , he is back to riding his mower   Still doing OT in [place] Average Pain 5 Pain Right Now 3 My pain is  bursitis in  left shoulder  In the last 24 hours, has pain interfered with the following? General activity 1 Relation with others 0 Enjoyment of life 0 What TIME of day is your pain at its worst? night Sleep (in general) Good  Pain is worse with: some activites Pain improves with: medication Relief from Meds: 9  Family History  Problem Relation Age of Onset   Cancer Mother    Heart attack Father    Heart attack Sister    Heart disease Sister    Diabetes Brother    Social History   Socioeconomic History   Marital status: Married    Spouse name: Not on file   Number of children: Not on file   Years of education: Not on file   Highest education level: Not on file  Occupational History   Not on file  Tobacco Use   Smoking status: Former    Years: 40.00    Types: Cigarettes   Smokeless tobacco: Never  Vaping Use   Vaping Use: Never used  Substance and Sexual Activity   Alcohol use: No   Drug use: No   Sexual activity: Not on file  Other Topics Concern   Not on file  Social History Narrative   Not on file   Social Determinants of Health   Financial Resource Strain: Not on file  Food Insecurity: Not on file  Transportation Needs: Not on file  Physical Activity: Not on file  Stress: Not on file  Social Connections: Not on file   Past Surgical History:  Procedure Laterality Date   COLON  SURGERY     COLONOSCOPY WITH PROPOFOL N/A 09/18/2016   Procedure: COLONOSCOPY WITH PROPOFOL;  Surgeon: Lollie Sails, MD;  Location: [facility] ENDOSCOPY;  Service: Endoscopy;  Laterality: N/A;   EYE SURGERY     cataract extraction   IR CT HEAD LTD  05/10/2020   IR PERCUTANEOUS ART THROMBECTOMY/INFUSION INTRACRANIAL INC DIAG ANGIO  05/10/2020   LOOP RECORDER INSERTION N/A 05/14/2020   Procedure: LOOP RECORDER INSERTION;  Surgeon: Constance Haw, MD;  Location: [place] [facility];  Service: Cardiovascular;  Laterality: N/A;   RADIOLOGY WITH ANESTHESIA N/A 05/10/2020   Procedure: IR WITH  ANESTHESIA;  Surgeon: Radiologist, Medication, MD;  Location: [place];  Service: Radiology;  Laterality: N/A;   Past Surgical History:  Procedure Laterality Date   COLON SURGERY     COLONOSCOPY WITH PROPOFOL N/A 09/18/2016   Procedure: COLONOSCOPY WITH PROPOFOL;  Surgeon: Lollie Sails, MD;  Location: [facility] ENDOSCOPY;  Service: Endoscopy;  Laterality: N/A;   EYE SURGERY     cataract extraction   IR CT HEAD LTD  05/10/2020   IR PERCUTANEOUS ART THROMBECTOMY/INFUSION INTRACRANIAL INC DIAG ANGIO  05/10/2020   LOOP RECORDER INSERTION N/A 05/14/2020   Procedure: LOOP RECORDER INSERTION;  Surgeon: Constance Haw, MD;  Location: [place] [facility];  Service: Cardiovascular;  Laterality: N/A;   RADIOLOGY WITH ANESTHESIA N/A 05/10/2020   Procedure: IR WITH ANESTHESIA;  Surgeon: Radiologist, Medication, MD;  Location: [place];  Service: Radiology;  Laterality: N/A;   Past Medical History:  Diagnosis Date   Chronic kidney disease    kidney stones   Diabetes mellitus without complication (HCC)    Hyperlipidemia    Hypertension    Tinea pedis    BP (!) 144/81   Pulse 72   Temp 97.7 F (36.5 C)   Ht 5\' 9"  (1.753 m)   Wt 200 lb (90.7 kg)   SpO2 95%   BMI 29.53 kg/m   Opioid Risk Score:   Fall Risk Score:  `1  Depression screen PHQ 2/9  Depression screen [facility] 2/9 10/14/2020 08/12/2020 06/11/2020  Decreased Interest 0 0 0  Down, Depressed, Hopeless 0 0 0  PHQ - 2 Score 0 0 0  Altered sleeping - 0 0  Tired, decreased energy - - 1  Change in appetite - - 0  Feeling bad or failure about yourself  - - 0  Trouble concentrating - - 0  Moving slowly or fidgety/restless - - 0  Suicidal thoughts - - 0  PHQ-9 Score - 0 1     Review of Systems  Constitutional: Negative.   HENT: Negative.    Eyes: Negative.   Respiratory: Negative.    Cardiovascular: Negative.   Gastrointestinal: Negative.   Endocrine: Negative.   Genitourinary: Negative.   Musculoskeletal:        Left shoulder pain   Skin: Negative.   Allergic/Immunologic: Negative.   Neurological: Negative.   Hematological:  Bruises/bleeds easily.       Plavix  Psychiatric/Behavioral: Negative.    All other systems reviewed and are negative.     Objective:   Physical Exam Vitals and nursing note reviewed.  Constitutional:      Appearance: He is normal weight.  HENT:     Head: Normocephalic and atraumatic.     Mouth/Throat:     Mouth: Mucous membranes are moist.  Eyes:     Extraocular Movements: Extraocular movements intact.     Conjunctiva/sclera: Conjunctivae normal.     Pupils:  Pupils are equal, round, and reactive to light.  Musculoskeletal:     Comments: Positive impingement sign, Michel Bickers at 90 degrees on left side. No pain with wrist or elbow range of motion no pain with lower extremity range of motion  Skin:    General: Skin is warm and dry.  Neurological:     Mental Status: He is alert and oriented to person, place, and time.     Comments: Motor strength is 5/5 in the right deltoid, bicep, tricep, grip, hip flexor, knee extensor, ankle dorsiflexor plantar flexor 3+ in the left deltoid, bicep, tricep, grip 4 at the left hip flexor 5 at the left knee extensor 5 at the left ankle dorsiflexor Gait no assistive device able to toe walk and heel walk has mild difficulty with tandem gait. Tone is normal left upper and left lower limb Visual fields are intact  Psychiatric:        Mood and Affect: Mood normal.        Behavior: Behavior normal.          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  He has been previously cleared by ophthalmology with his visual fields.  I do think he is okay to go back to driving on a graduated fashion as below Graduated return to driving instructions were provided. It is recommended that  the patient first drives with another licensed driver in an empty parking lot. If the patient does well with this, and they can drive on a quiet street with the licensed driver. If the patient does well with this they can drive on a busy street with a licensed driver. If the patient does well with this, the next time out they can go by himself. For the first month after resuming driving, I recommend no nighttime or Interstate driving.     Left shoulder adhesive capsulitis Shoulder injection, glenohumeral Palpation guided  Indication: Left shoulder pain not relieved by medication management and other conservative care.  Informed consent was obtained after describing risks and benefits of the procedure with the patient, this includes bleeding, bruising, infection and medication side effects. The patient wishes to proceed and has given written consent. Patient was placed in a seated position. The left shoulder was marked and prepped with betadine in the subacromial area. A 25-gauge 1-1/2 inch needle was inserted into the subacromial area. After negative draw back for blood, a solution containing 1 mL of 6 mg per ML betamethasone and 4 mL of 1% lidocaine was injected. A band aid was applied. The patient tolerated the procedure well. Post procedure instructions were given.

## 2020-10-14 NOTE — Patient Instructions (Signed)

## 2020-11-01 ENCOUNTER — Ambulatory Visit (INDEPENDENT_AMBULATORY_CARE_PROVIDER_SITE_OTHER): Payer: Medicare Other

## 2020-11-01 DIAGNOSIS — I63511 Cerebral infarction due to unspecified occlusion or stenosis of right middle cerebral artery: Secondary | ICD-10-CM | POA: Diagnosis not present

## 2020-11-01 LAB — CUP PACEART REMOTE DEVICE CHECK
Date Time Interrogation Session: 20220930151123
Implantable Pulse Generator Implant Date: 20220415

## 2020-11-08 NOTE — Progress Notes (Signed)
Carelink Summary Report / Loop Recorder 

## 2020-12-02 LAB — CUP PACEART REMOTE DEVICE CHECK
Date Time Interrogation Session: 20221102150852
Implantable Pulse Generator Implant Date: 20220415

## 2020-12-06 ENCOUNTER — Ambulatory Visit (INDEPENDENT_AMBULATORY_CARE_PROVIDER_SITE_OTHER): Payer: Medicare Other

## 2020-12-06 DIAGNOSIS — I63511 Cerebral infarction due to unspecified occlusion or stenosis of right middle cerebral artery: Secondary | ICD-10-CM

## 2020-12-07 ENCOUNTER — Ambulatory Visit: Payer: Medicare Other | Admitting: Physical Medicine & Rehabilitation

## 2020-12-08 NOTE — Progress Notes (Signed)
Carelink Summary Report / Loop Recorder 

## 2021-01-10 ENCOUNTER — Ambulatory Visit (INDEPENDENT_AMBULATORY_CARE_PROVIDER_SITE_OTHER): Payer: Medicare Other

## 2021-01-10 DIAGNOSIS — I63511 Cerebral infarction due to unspecified occlusion or stenosis of right middle cerebral artery: Secondary | ICD-10-CM | POA: Diagnosis not present

## 2021-01-11 LAB — CUP PACEART REMOTE DEVICE CHECK
Date Time Interrogation Session: 20221205151019
Implantable Pulse Generator Implant Date: 20220415

## 2021-01-17 NOTE — Progress Notes (Signed)
Carelink Summary Report / Loop Recorder 

## 2021-02-07 ENCOUNTER — Ambulatory Visit (INDEPENDENT_AMBULATORY_CARE_PROVIDER_SITE_OTHER): Payer: Medicare Other

## 2021-02-07 DIAGNOSIS — I63511 Cerebral infarction due to unspecified occlusion or stenosis of right middle cerebral artery: Secondary | ICD-10-CM

## 2021-02-07 LAB — CUP PACEART REMOTE DEVICE CHECK
Date Time Interrogation Session: 20230109000409
Implantable Pulse Generator Implant Date: 20220415

## 2021-02-16 NOTE — Progress Notes (Signed)
Carelink Summary Report / Loop Recorder 

## 2021-02-18 ENCOUNTER — Telehealth: Payer: Self-pay | Admitting: Cardiology

## 2021-02-18 NOTE — Telephone Encounter (Signed)
°  1. Has your device fired? no  2. Is you device beeping? no  3. Are you experiencing draining or swelling at device site? no  4. Are you calling to see if we received your device transmission? no  5. Have you passed out? no   Patient's wife states she saw on the transmission on MyChart that it said he had a right atrial stroke, she is wondering if that is his old stroke or something new.  Please route to [place]

## 2021-02-21 NOTE — Telephone Encounter (Signed)
Spoke with patient's wife informed her that this was not a new diagnosis, patients wife voiced understanding appreciative of call back

## 2021-03-12 LAB — CUP PACEART REMOTE DEVICE CHECK
Date Time Interrogation Session: 20230211000453
Implantable Pulse Generator Implant Date: 20220415

## 2021-03-14 ENCOUNTER — Ambulatory Visit (INDEPENDENT_AMBULATORY_CARE_PROVIDER_SITE_OTHER): Payer: Medicare Other

## 2021-03-14 DIAGNOSIS — I63511 Cerebral infarction due to unspecified occlusion or stenosis of right middle cerebral artery: Secondary | ICD-10-CM

## 2021-03-16 NOTE — Progress Notes (Signed)
Carelink Summary Report / Loop Recorder 

## 2021-04-18 ENCOUNTER — Ambulatory Visit (INDEPENDENT_AMBULATORY_CARE_PROVIDER_SITE_OTHER): Payer: Medicare Other

## 2021-04-18 DIAGNOSIS — I63511 Cerebral infarction due to unspecified occlusion or stenosis of right middle cerebral artery: Secondary | ICD-10-CM

## 2021-04-19 LAB — CUP PACEART REMOTE DEVICE CHECK
Date Time Interrogation Session: 20230320000642
Implantable Pulse Generator Implant Date: 20220415

## 2021-05-03 NOTE — Progress Notes (Signed)
Carelink Summary Report / Loop Recorder 

## 2021-05-23 ENCOUNTER — Ambulatory Visit (INDEPENDENT_AMBULATORY_CARE_PROVIDER_SITE_OTHER): Payer: Medicare Other

## 2021-05-23 DIAGNOSIS — I63511 Cerebral infarction due to unspecified occlusion or stenosis of right middle cerebral artery: Secondary | ICD-10-CM | POA: Diagnosis not present

## 2021-05-23 LAB — CUP PACEART REMOTE DEVICE CHECK
Date Time Interrogation Session: 20230422000441
Implantable Pulse Generator Implant Date: 20220415

## 2021-06-07 NOTE — Progress Notes (Signed)
Carelink Summary Report / Loop Recorder 

## 2021-06-24 ENCOUNTER — Ambulatory Visit (INDEPENDENT_AMBULATORY_CARE_PROVIDER_SITE_OTHER): Payer: Medicare Other

## 2021-06-24 DIAGNOSIS — I639 Cerebral infarction, unspecified: Secondary | ICD-10-CM | POA: Diagnosis not present

## 2021-06-27 LAB — CUP PACEART REMOTE DEVICE CHECK
Date Time Interrogation Session: 20230525000640
Implantable Pulse Generator Implant Date: 20220415

## 2021-07-04 NOTE — Progress Notes (Signed)
Carelink Summary Report / Loop Recorder 

## 2021-07-27 ENCOUNTER — Ambulatory Visit (INDEPENDENT_AMBULATORY_CARE_PROVIDER_SITE_OTHER): Payer: Medicare Other

## 2021-07-27 DIAGNOSIS — I639 Cerebral infarction, unspecified: Secondary | ICD-10-CM | POA: Diagnosis not present

## 2021-07-28 LAB — CUP PACEART REMOTE DEVICE CHECK
Date Time Interrogation Session: 20230627000640
Implantable Pulse Generator Implant Date: 20220415

## 2021-08-16 NOTE — Progress Notes (Signed)
Carelink Summary Report / Loop Recorder 

## 2021-08-29 ENCOUNTER — Ambulatory Visit (INDEPENDENT_AMBULATORY_CARE_PROVIDER_SITE_OTHER): Payer: Medicare Other

## 2021-08-29 DIAGNOSIS — I639 Cerebral infarction, unspecified: Secondary | ICD-10-CM

## 2021-08-29 LAB — CUP PACEART REMOTE DEVICE CHECK
Date Time Interrogation Session: 20230730000427
Implantable Pulse Generator Implant Date: 20220415

## 2021-10-02 NOTE — Progress Notes (Signed)
Carelink Summary Report / Loop Recorder 

## 2021-10-03 LAB — CUP PACEART REMOTE DEVICE CHECK
Date Time Interrogation Session: 20230901000851
Implantable Pulse Generator Implant Date: 20220415

## 2021-10-04 ENCOUNTER — Ambulatory Visit (INDEPENDENT_AMBULATORY_CARE_PROVIDER_SITE_OTHER): Payer: Medicare Other

## 2021-10-04 DIAGNOSIS — I639 Cerebral infarction, unspecified: Secondary | ICD-10-CM

## 2021-10-28 NOTE — Progress Notes (Signed)
Carelink Summary Report / Loop Recorder 

## 2021-11-02 LAB — CUP PACEART REMOTE DEVICE CHECK
Date Time Interrogation Session: 20231004000754
Implantable Pulse Generator Implant Date: 20220415

## 2021-11-07 ENCOUNTER — Ambulatory Visit (INDEPENDENT_AMBULATORY_CARE_PROVIDER_SITE_OTHER): Payer: Medicare Other

## 2021-11-07 DIAGNOSIS — I639 Cerebral infarction, unspecified: Secondary | ICD-10-CM | POA: Diagnosis not present

## 2021-11-16 NOTE — Progress Notes (Signed)
Carelink Summary Report / Loop Recorder 

## 2021-12-12 ENCOUNTER — Ambulatory Visit (INDEPENDENT_AMBULATORY_CARE_PROVIDER_SITE_OTHER): Payer: Medicare Other

## 2021-12-12 DIAGNOSIS — I639 Cerebral infarction, unspecified: Secondary | ICD-10-CM

## 2021-12-13 LAB — CUP PACEART REMOTE DEVICE CHECK
Date Time Interrogation Session: 20231113000127
Implantable Pulse Generator Implant Date: 20220415

## 2022-01-16 ENCOUNTER — Ambulatory Visit (INDEPENDENT_AMBULATORY_CARE_PROVIDER_SITE_OTHER): Payer: Medicare Other

## 2022-01-16 DIAGNOSIS — I639 Cerebral infarction, unspecified: Secondary | ICD-10-CM | POA: Diagnosis not present

## 2022-01-17 LAB — CUP PACEART REMOTE DEVICE CHECK
Date Time Interrogation Session: 20231218001016
Implantable Pulse Generator Implant Date: 20220415

## 2022-01-26 NOTE — Progress Notes (Signed)
Carelink Summary Report / Loop Recorder 

## 2022-02-20 ENCOUNTER — Ambulatory Visit: Payer: Medicare Other | Attending: Cardiology

## 2022-02-20 DIAGNOSIS — I639 Cerebral infarction, unspecified: Secondary | ICD-10-CM

## 2022-02-22 LAB — CUP PACEART REMOTE DEVICE CHECK
Date Time Interrogation Session: 20240120000756
Implantable Pulse Generator Implant Date: 20220415

## 2022-02-24 NOTE — Progress Notes (Signed)
Carelink Summary Report / Loop Recorder

## 2022-03-22 ENCOUNTER — Ambulatory Visit (INDEPENDENT_AMBULATORY_CARE_PROVIDER_SITE_OTHER): Payer: Medicare Other

## 2022-03-22 DIAGNOSIS — I639 Cerebral infarction, unspecified: Secondary | ICD-10-CM

## 2022-03-23 LAB — CUP PACEART REMOTE DEVICE CHECK
Date Time Interrogation Session: 20240222000150
Implantable Pulse Generator Implant Date: 20220415

## 2022-04-10 NOTE — Progress Notes (Signed)
Carelink Summary Report / Loop Recorder 

## 2022-04-18 NOTE — Progress Notes (Signed)
Carelink Summary Report / Loop Recorder 

## 2022-04-24 ENCOUNTER — Ambulatory Visit (INDEPENDENT_AMBULATORY_CARE_PROVIDER_SITE_OTHER): Payer: Medicare Other

## 2022-04-24 DIAGNOSIS — I639 Cerebral infarction, unspecified: Secondary | ICD-10-CM | POA: Diagnosis not present

## 2022-04-25 LAB — CUP PACEART REMOTE DEVICE CHECK
Date Time Interrogation Session: 20240326001030
Implantable Pulse Generator Implant Date: 20220415

## 2022-05-29 ENCOUNTER — Ambulatory Visit (INDEPENDENT_AMBULATORY_CARE_PROVIDER_SITE_OTHER): Payer: Medicare Other

## 2022-05-29 DIAGNOSIS — I639 Cerebral infarction, unspecified: Secondary | ICD-10-CM

## 2022-05-29 LAB — CUP PACEART REMOTE DEVICE CHECK
Date Time Interrogation Session: 20240428000428
Implantable Pulse Generator Implant Date: 20220415

## 2022-05-31 ENCOUNTER — Other Ambulatory Visit: Payer: Self-pay | Admitting: Gastroenterology

## 2022-05-31 DIAGNOSIS — Z86010 Personal history of colonic polyps: Secondary | ICD-10-CM

## 2022-06-06 NOTE — Progress Notes (Signed)
Carelink Summary Report / Loop Recorder 

## 2022-06-30 NOTE — Progress Notes (Signed)
Carelink Summary Report / Loop Recorder 

## 2022-07-03 ENCOUNTER — Ambulatory Visit (INDEPENDENT_AMBULATORY_CARE_PROVIDER_SITE_OTHER): Payer: Medicare Other

## 2022-07-03 DIAGNOSIS — I639 Cerebral infarction, unspecified: Secondary | ICD-10-CM

## 2022-07-04 ENCOUNTER — Ambulatory Visit: Payer: Self-pay | Admitting: Surgery

## 2022-07-04 LAB — CUP PACEART REMOTE DEVICE CHECK
Date Time Interrogation Session: 20240603000505
Implantable Pulse Generator Implant Date: 20220415

## 2022-07-04 NOTE — H&P (View-Only) (Signed)
Subjective:   CC: Carbuncle [L02.93]  HPI:  Thomas Sampson is a 77 y.o. male consulted by [facility] presents for evaluation of above. First noted 2 weeks ago.  S/p I&D, [facility].  Still reports TTP, swelling, discharge, redness, so presents to our clinic for further evaluation.   Past Medical History:  has a past medical history of Arthritis, Diverticulosis (09/18/2016), History of stroke, Hyperlipidemia, Hypertension, Kidney stone, Tinea pedis, Type 2 diabetes mellitus (CMS/HHS-HCC), and Type II or unspecified type diabetes mellitus without mention of complication, not stated as uncontrolled (CMS/HHS-HCC) (1995).  Past Surgical History:  has a past surgical history that includes Colectomy Partial Abdominal & Transanal; Cataract extraction (09/2009); Cataract extraction (05/2010); Colonoscopy (03/01/01; 06/07/06; 02/25/09; 04/17/11); and Colonoscopy (09/18/2016).  Family History: family history includes Breast cancer in his mother and sister; Cancer in his mother and sister; Diabetes in his brother; Diabetes type II in his brother; Heart disease in his brother and father; Myocardial Infarction (Heart attack) in his brother and father; No Known Problems in his maternal grandfather, maternal grandmother, paternal grandfather, paternal grandmother, and son.  Social History:  reports that he quit smoking about 49 years ago. His smoking use included cigarettes. He has never used smokeless tobacco. He reports that he does not drink alcohol and does not use drugs.  Current Medications: has a current medication list which includes the following prescription(s): acetaminophen, alpha lipoic acid, amlodipine, atorvastatin, calcium carb/vitamin d3/vit k1, cholecalciferol, clopidogrel, cyanocobalamin, doxycycline, econazole, insulin aspart, insulin glargine, jardiance, lidocaine, lifitegrast, losartan, multivitamin, pantoprazole, pregabalin, albuterol mdi (proventil, ventolin, proair) hfa, blood  glucose meter, levetiracetam, oxcarbazepine, and sertraline.  Allergies:  Allergies  Allergen Reactions   Codeine Vomiting   Meperidine Nausea    Upset stomach    ROS:  A 15 point review of systems was performed and pertinent positives and negatives noted in HPI   Objective:     BP (!) 143/71   Pulse 86   Ht 175.3 cm (5' 9.02")   Wt 91.8 kg (202 lb 6.1 oz)   BMI 29.87 kg/m   Constitutional :  No distress, cooperative, alert  Lymphatics/Throat:  Supple with no lymphadenopathy  Respiratory:  Clear to auscultation bilaterally  Cardiovascular:  Regular rate and rhythm  Gastrointestinal: Soft, non-tender, non-distended, no organomegaly.  Musculoskeletal: Steady gait and movement  Skin: Cool and moist, obviously infected carbuncle on midline back in between scapula.  Redness, purulent discharge, some TTP.  Area measures roughly 7cm x 6cm.  Psychiatric: Normal affect, non-agitated, not confused         LABS:  N/a   RADS: N/a  Assessment:      Carbuncle [L02.93]- pt on plavix.  Due to size and use of plavix, as well as persistent nature despite I&D last week, recommend formal I&D, debridement in OR.  Plan:     1. Carbuncle [L02.93]   Alternatives include continued observation with antibiotics.  Benefits include possible symptom relief, pathologic evaluation, improved cosmesis.  Discussed the risk of surgery including recurrence, chronic pain, post-op infxn, poor cosmesis, poor/delayed wound healing, and possible re-operation to address said risks. The risks of general anesthetic, if used, includes MI, CVA, sudden death or even reaction to anesthetic medications also discussed.  Typical post-op recovery time of 3-5 days with possible activity restrictions were also discussed.  The patient verbalized understanding and all questions were answered to the patient's satisfaction.  2. Patient has elected to proceed with surgical treatment. Procedure will be  scheduled.    labs/images/medications/previous chart entries reviewed personally and relevant changes/updates noted above.  

## 2022-07-04 NOTE — H&P (Signed)
Subjective:   CC: Carbuncle [L02.93]  HPI:  Thomas Sampson is a 77 y.o. male consulted by Lenise Arena presents for evaluation of above. First noted 2 weeks ago.  S/p I&D, placed on doxycycline at [facility].  Still reports TTP, swelling, discharge, redness, so presents to our clinic for further evaluation.   Past Medical History:  has a past medical history of Arthritis, Diverticulosis (09/18/2016), History of stroke, Hyperlipidemia, Hypertension, Kidney stone, Tinea pedis, Type 2 diabetes mellitus (CMS/HHS-HCC), and Type II or unspecified type diabetes mellitus without mention of complication, not stated as uncontrolled (CMS/HHS-HCC) (1995).  Past Surgical History:  has a past surgical history that includes Colectomy Partial Abdominal & Transanal; Cataract extraction (09/2009); Cataract extraction (05/2010); Colonoscopy (03/01/01; 06/07/06; 02/25/09; 04/17/11); and Colonoscopy (09/18/2016).  Family History: family history includes Breast cancer in his mother and sister; Cancer in his mother and sister; Diabetes in his brother; Diabetes type II in his brother; Heart disease in his brother and father; Myocardial Infarction (Heart attack) in his brother and father; No Known Problems in his maternal grandfather, maternal grandmother, paternal grandfather, paternal grandmother, and son.  Social History:  reports that he quit smoking about 49 years ago. His smoking use included cigarettes. He has never used smokeless tobacco. He reports that he does not drink alcohol and does not use drugs.  Current Medications: has a current medication list which includes the following prescription(s): acetaminophen, alpha lipoic acid, amlodipine, atorvastatin, calcium carb/vitamin d3/vit k1, cholecalciferol, clopidogrel, cyanocobalamin, doxycycline, econazole, insulin aspart, insulin glargine, jardiance, lidocaine, lifitegrast, losartan, multivitamin, pantoprazole, pregabalin, albuterol mdi (proventil, ventolin, proair) hfa, blood  glucose meter, levetiracetam, oxcarbazepine, and sertraline.  Allergies:  Allergies  Allergen Reactions   Codeine Vomiting   Meperidine Nausea    Upset stomach    ROS:  A 15 point review of systems was performed and pertinent positives and negatives noted in HPI   Objective:     BP (!) 143/71   Pulse 86   Ht 175.3 cm (5' 9.02")   Wt 91.8 kg (202 lb 6.1 oz)   BMI 29.87 kg/m   Constitutional :  No distress, cooperative, alert  Lymphatics/Throat:  Supple with no lymphadenopathy  Respiratory:  Clear to auscultation bilaterally  Cardiovascular:  Regular rate and rhythm  Gastrointestinal: Soft, non-tender, non-distended, no organomegaly.  Musculoskeletal: Steady gait and movement  Skin: Cool and moist, obviously infected carbuncle on midline back in between scapula.  Redness, purulent discharge, some TTP.  Area measures roughly 7cm x 6cm.  Psychiatric: Normal affect, non-agitated, not confused         LABS:  N/a   RADS: N/a  Assessment:      Carbuncle [L02.93]- pt on plavix.  Due to size and use of plavix, as well as persistent nature despite I&D last week, recommend formal I&D, debridement in OR.  Plan:     1. Carbuncle [L02.93]   Alternatives include continued observation with antibiotics.  Benefits include possible symptom relief, pathologic evaluation, improved cosmesis.  Discussed the risk of surgery including recurrence, chronic pain, post-op infxn, poor cosmesis, poor/delayed wound healing, and possible re-operation to address said risks. The risks of general anesthetic, if used, includes MI, CVA, sudden death or even reaction to anesthetic medications also discussed.  Typical post-op recovery time of 3-5 days with possible activity restrictions were also discussed.  The patient verbalized understanding and all questions were answered to the patient's satisfaction.  2. Patient has elected to proceed with surgical treatment. Procedure will be  scheduled.  labs/images/medications/previous chart entries reviewed personally and relevant changes/updates noted above.

## 2022-07-06 ENCOUNTER — Encounter
Admission: RE | Admit: 2022-07-06 | Discharge: 2022-07-06 | Disposition: A | Discharge status: Home / Self Care | Payer: Medicare Other | Source: Ambulatory Visit | Attending: Surgery | Admitting: Surgery

## 2022-07-06 VITALS — Ht 69.0 in | Wt 204.0 lb

## 2022-07-06 DIAGNOSIS — Z01818 Encounter for other preprocedural examination: Secondary | ICD-10-CM | POA: Insufficient documentation

## 2022-07-06 DIAGNOSIS — I129 Hypertensive chronic kidney disease with stage 1 through stage 4 chronic kidney disease, or unspecified chronic kidney disease: Secondary | ICD-10-CM | POA: Diagnosis not present

## 2022-07-06 DIAGNOSIS — I10 Essential (primary) hypertension: Secondary | ICD-10-CM

## 2022-07-06 DIAGNOSIS — N184 Chronic kidney disease, stage 4 (severe): Secondary | ICD-10-CM | POA: Diagnosis not present

## 2022-07-06 DIAGNOSIS — Z01812 Encounter for preprocedural laboratory examination: Secondary | ICD-10-CM

## 2022-07-06 DIAGNOSIS — Z01810 Encounter for preprocedural cardiovascular examination: Secondary | ICD-10-CM

## 2022-07-06 HISTORY — DX: Atelectasis: J98.11

## 2022-07-06 HISTORY — DX: Cortical age-related cataract, bilateral: H25.013

## 2022-07-06 HISTORY — DX: Personal history of urinary calculi: Z87.442

## 2022-07-06 HISTORY — DX: Basal cell carcinoma of skin of nose: C44.311

## 2022-07-06 HISTORY — DX: Cutaneous abscess of back (any part, except buttock and flank): L02.212

## 2022-07-06 HISTORY — DX: Unspecified osteoarthritis, unspecified site: M19.90

## 2022-07-06 HISTORY — DX: Chronic kidney disease, stage 4 (severe): N18.4

## 2022-07-06 HISTORY — DX: Type 2 diabetes mellitus without complications: E11.9

## 2022-07-06 HISTORY — DX: Pneumonia, unspecified organism: J18.9

## 2022-07-06 HISTORY — DX: Gastro-esophageal reflux disease without esophagitis: K21.9

## 2022-07-06 HISTORY — DX: Diverticulosis of intestine, part unspecified, without perforation or abscess without bleeding: K57.90

## 2022-07-06 LAB — BASIC METABOLIC PANEL
Anion gap: 9 (ref 5–15)
BUN: 26 mg/dL — ABNORMAL HIGH (ref 8–23)
CO2: 25 mmol/L (ref 22–32)
Calcium: 8.7 mg/dL — ABNORMAL LOW (ref 8.9–10.3)
Chloride: 106 mmol/L (ref 98–111)
Creatinine, Ser: 1.38 mg/dL — ABNORMAL HIGH (ref 0.61–1.24)
GFR, Estimated: 53 mL/min — ABNORMAL LOW (ref >60)
Glucose, Bld: 93 mg/dL (ref 70–99)
Potassium: 3.7 mmol/L (ref 3.5–5.1)
Sodium: 140 mmol/L (ref 135–145)

## 2022-07-06 NOTE — Patient Instructions (Addendum)
Your procedure is scheduled on: Monday, June 10 Report to the Registration Desk on the 1st floor of the [facility] To find out your arrival time, please call [phone] between 1PM - 3PM on: Friday, June 7 If your arrival time is 6:00 am, do not arrive before that time as the [facility] entrance doors do not open until 6:00 am.  REMEMBER: Instructions that are not followed completely may result in serious medical risk, up to and including death; or upon the discretion of your surgeon and anesthesiologist your surgery may need to be rescheduled.  Do not eat food after midnight the night before surgery.  No gum chewing or hard candies.  You may however, drink water up to 2 hours before you are scheduled to arrive for your surgery. Do not drink anything within 2 hours of your scheduled arrival time.  One week prior to surgery: starting today, June 6 Stop Anti-inflammatories (NSAIDS) such as Advil, Aleve, Ibuprofen, Motrin, Naproxen, Naprosyn and Aspirin based products such as Excedrin, Goody's Powder, BC Powder. Stop ANY OVER THE COUNTER supplements until after surgery. Stop alpha lipoic acid, calcium vitamin D, multiple vitamins You may however, continue to take Tylenol if needed for pain up until the day of surgery.  Continue taking all prescribed medications with the exception of the following:  Clopidogrel (Plavix) - hold for 2 days before surgery. Last day to take is Friday, June 7. Resume AFTER surgery per surgeon instructions.  Lantus - only take half of your regular dose the night before surgery. Only take 15 units Lantus on Sunday night, June 9.  Do NOT take ANY insulin on the morning of surgery.  TAKE ONLY THESE MEDICATIONS THE MORNING OF SURGERY WITH A SIP OF WATER:  Amlodipine Atorvastatin Levetiracetam (Keppra) Oxcarbazepine (Trileptal) Pantoprazole (Protonix) Pregabalin (Lyrica) Sertraline (Zoloft)  No Alcohol for 24 hours before or after surgery.  No Smoking  including e-cigarettes for 24 hours before surgery.  No chewable tobacco products for at least 6 hours before surgery.  No nicotine patches on the day of surgery.  Do not use any "recreational" drugs for at least a week (preferably 2 weeks) before your surgery.  Please be advised that the combination of cocaine and anesthesia may have negative outcomes, up to and including death. If you test positive for cocaine, your surgery will be cancelled.  On the morning of surgery brush your teeth with toothpaste and water, you may rinse your mouth with mouthwash if you wish. Do not swallow any toothpaste or mouthwash.  Use CHG Soap as directed on instruction sheet.  Do not wear jewelry, make-up, hairpins, clips or nail polish.  Do not wear lotions, powders, or perfumes.   Do not shave body hair from the neck down 48 hours before surgery.  Contact lenses, hearing aids and dentures may not be worn into surgery.  Do not bring valuables to the hospital. [facility] is not responsible for any missing/lost belongings or valuables.   Notify your doctor if there is any change in your medical condition (cold, fever, infection).  Wear comfortable clothing (specific to your surgery type) to the hospital.  After surgery, you can help prevent lung complications by doing breathing exercises.  Take deep breaths and cough every 1-2 hours. Your doctor may order a device called an Incentive Spirometer to help you take deep breaths.  If you are being discharged the day of surgery, you will not be allowed to drive home. You will need a responsible individual  to drive you home and stay with you for 24 hours after surgery.   If you are taking public transportation, you will need to have a responsible individual with you.  Please call the Pre-admissions Testing Dept. at [phone] if you have any questions about these instructions.  Surgery Visitation Policy:  Patients having surgery or a procedure may  have two visitors.  Children under the age of 2 must have an adult with them who is not the patient.     Preparing for Surgery with CHLORHEXIDINE GLUCONATE (CHG) Soap  Chlorhexidine Gluconate (CHG) Soap  o An antiseptic cleaner that kills germs and bonds with the skin to continue killing germs even after washing  o Used for showering the night before surgery and morning of surgery  Before surgery, you can play an important role by reducing the number of germs on your skin.  CHG (Chlorhexidine gluconate) soap is an antiseptic cleanser which kills germs and bonds with the skin to continue killing germs even after washing.  Please do not use if you have an allergy to CHG or antibacterial soaps. If your skin becomes reddened/irritated stop using the CHG.  1. Shower the NIGHT BEFORE SURGERY and the MORNING OF SURGERY with CHG soap.  2. If you choose to wash your hair, wash your hair first as usual with your normal shampoo.  3. After shampooing, rinse your hair and body thoroughly to remove the shampoo.  4. Use CHG as you would any other liquid soap. You can apply CHG directly to the skin and wash gently with a scrungie or a clean washcloth.  5. Apply the CHG soap to your body only from the neck down. Do not use on open wounds or open sores. Avoid contact with your eyes, ears, mouth, and genitals (private parts). Wash face and genitals (private parts) with your normal soap.  6. Wash thoroughly, paying special attention to the area where your surgery will be performed.  7. Thoroughly rinse your body with warm water.  8. Do not shower/wash with your normal soap after using and rinsing off the CHG soap.  9. Pat yourself dry with a clean towel.  10. Wear clean pajamas to bed the night before surgery.  12. Place clean sheets on your bed the night of your first shower and do not sleep with pets.  13. Shower again with the CHG soap on the day of surgery prior to arriving at the  hospital.  14. Do not apply any deodorants/lotions/powders.  15. Please wear clean clothes to the hospital.

## 2022-07-06 NOTE — Pre-Procedure Instructions (Signed)
Copy and pasted from care everywhere:  Sung Amabile, DO - 07/04/2022 2:15 PM EDT  Formatting of this note might be different from the original. [place] L02.93- will proceed with [id number]  Please hold plavix 2 days prior to surgery date if ok with neurology Electronically signed by Sung Amabile, DO at 07/04/2022 2:33 PM EDT   de Cordelia Pen, MD - 05/03/2022 12:45 PM EDT  Formatting of this note might be different from the original. It was a pleasure seeing you in clinic.  As we discussed, we are happy to hear you have been doing well from a seizure and stroke perspective. We agree with holding your Plavix up to 7 days for important invasive procedures as colonoscopy hemorrhoid surgery, ask your providers to please restart as soon as possible to try to minimize days off Plvix as much as possible considering the bleeding risk with your providers.  Follow up in 1 year or earlier if any new symptoms Electronically signed by Con Memos, MD at 05/03/2022 1:38 PM EDT

## 2022-07-09 MED ORDER — CHLORHEXIDINE GLUCONATE CLOTH 2 % EX PADS
6.0000 | MEDICATED_PAD | Freq: Once | CUTANEOUS | Status: AC
  Administered 2022-07-10: 6 via TOPICAL

## 2022-07-09 MED ORDER — ORAL CARE MOUTH RINSE: 15.0000 mL | Freq: Once | OROMUCOSAL | Status: AC

## 2022-07-09 MED ORDER — CEFAZOLIN SODIUM-DEXTROSE 2-4 GM/100ML-% IV SOLN
2.0000 g | INTRAVENOUS | Status: AC
  Administered 2022-07-10: 2 g via INTRAVENOUS

## 2022-07-09 MED ORDER — SODIUM CHLORIDE 0.9 % IV SOLN: INTRAVENOUS | Status: DC

## 2022-07-09 MED ORDER — CHLORHEXIDINE GLUCONATE 0.12 % MT SOLN
15.0000 mL | Freq: Once | OROMUCOSAL | Status: AC
  Administered 2022-07-10: 15 mL via OROMUCOSAL

## 2022-07-10 ENCOUNTER — Ambulatory Visit
Admission: RE | Admit: 2022-07-10 | Discharge: 2022-07-10 | Disposition: A | Discharge status: Home / Self Care | Payer: Medicare Other | Attending: Surgery | Admitting: Surgery

## 2022-07-10 ENCOUNTER — Encounter: Payer: Self-pay | Admitting: Surgery

## 2022-07-10 ENCOUNTER — Ambulatory Visit: Payer: Medicare Other | Admitting: Urgent Care

## 2022-07-10 ENCOUNTER — Encounter
Admission: RE | Disposition: A | Discharge status: Home / Self Care | Payer: Self-pay | Source: Home / Self Care | Attending: Surgery

## 2022-07-10 ENCOUNTER — Other Ambulatory Visit: Payer: Self-pay

## 2022-07-10 ENCOUNTER — Ambulatory Visit: Payer: Medicare Other | Admitting: Anesthesiology

## 2022-07-10 DIAGNOSIS — R569 Unspecified convulsions: Secondary | ICD-10-CM | POA: Diagnosis not present

## 2022-07-10 DIAGNOSIS — I69354 Hemiplegia and hemiparesis following cerebral infarction affecting left non-dominant side: Secondary | ICD-10-CM | POA: Insufficient documentation

## 2022-07-10 DIAGNOSIS — I129 Hypertensive chronic kidney disease with stage 1 through stage 4 chronic kidney disease, or unspecified chronic kidney disease: Secondary | ICD-10-CM | POA: Insufficient documentation

## 2022-07-10 DIAGNOSIS — Z794 Long term (current) use of insulin: Secondary | ICD-10-CM | POA: Insufficient documentation

## 2022-07-10 DIAGNOSIS — Z87891 Personal history of nicotine dependence: Secondary | ICD-10-CM | POA: Insufficient documentation

## 2022-07-10 DIAGNOSIS — N184 Chronic kidney disease, stage 4 (severe): Secondary | ICD-10-CM | POA: Diagnosis not present

## 2022-07-10 DIAGNOSIS — Z01812 Encounter for preprocedural laboratory examination: Secondary | ICD-10-CM

## 2022-07-10 DIAGNOSIS — E1122 Type 2 diabetes mellitus with diabetic chronic kidney disease: Secondary | ICD-10-CM | POA: Insufficient documentation

## 2022-07-10 DIAGNOSIS — L02212 Cutaneous abscess of back [any part, except buttock]: Secondary | ICD-10-CM | POA: Diagnosis present

## 2022-07-10 DIAGNOSIS — K219 Gastro-esophageal reflux disease without esophagitis: Secondary | ICD-10-CM | POA: Diagnosis not present

## 2022-07-10 DIAGNOSIS — L729 Follicular cyst of the skin and subcutaneous tissue, unspecified: Secondary | ICD-10-CM | POA: Insufficient documentation

## 2022-07-10 HISTORY — PX: INCISION AND DRAINAGE ABSCESS: SHX5864

## 2022-07-10 LAB — AEROBIC/ANAEROBIC CULTURE W GRAM STAIN (SURGICAL/DEEP WOUND): Gram Stain: NONE SEEN

## 2022-07-10 LAB — GLUCOSE, CAPILLARY
Glucose-Capillary: 161 mg/dL — ABNORMAL HIGH (ref 70–99)
Glucose-Capillary: 187 mg/dL — ABNORMAL HIGH (ref 70–99)

## 2022-07-10 SURGERY — INCISION AND DRAINAGE, ABSCESS
Anesthesia: General | Site: Back

## 2022-07-10 MED ORDER — LIDOCAINE HCL (PF) 1 % IJ SOLN
INTRAMUSCULAR | Status: AC
  Filled 2022-07-10: qty 30

## 2022-07-10 MED ORDER — LIDOCAINE HCL (PF) 2 % IJ SOLN
INTRAMUSCULAR | Status: AC
  Filled 2022-07-10: qty 5

## 2022-07-10 MED ORDER — PHENYLEPHRINE 80 MCG/ML (10ML) SYRINGE FOR IV PUSH (FOR BLOOD PRESSURE SUPPORT)
PREFILLED_SYRINGE | INTRAVENOUS | Status: DC | PRN
  Administered 2022-07-10 (×2): 160 ug via INTRAVENOUS
  Administered 2022-07-10 (×2): 80 ug via INTRAVENOUS

## 2022-07-10 MED ORDER — PROPOFOL 10 MG/ML IV BOLUS
INTRAVENOUS | Status: AC
  Filled 2022-07-10: qty 20

## 2022-07-10 MED ORDER — CHLORHEXIDINE GLUCONATE 0.12 % MT SOLN
OROMUCOSAL | Status: AC
  Filled 2022-07-10: qty 15

## 2022-07-10 MED ORDER — INSULIN ASPART 100 UNIT/ML FLEXPEN: 8.0000 [IU] | PEN_INJECTOR | Freq: Three times a day (TID) | SUBCUTANEOUS | Status: AC

## 2022-07-10 MED ORDER — PROPOFOL 10 MG/ML IV BOLUS
INTRAVENOUS | Status: DC | PRN
  Administered 2022-07-10: 150 mg via INTRAVENOUS

## 2022-07-10 MED ORDER — TRAMADOL HCL 50 MG PO TABS: 50.0000 mg | ORAL_TABLET | Freq: Four times a day (QID) | ORAL | 0 refills | Status: AC | PRN

## 2022-07-10 MED ORDER — LIDOCAINE 5 % EX PTCH: 1.0000 | MEDICATED_PATCH | Freq: Every day | CUTANEOUS | Status: AC | PRN

## 2022-07-10 MED ORDER — BUPIVACAINE HCL (PF) 0.25 % IJ SOLN
INTRAMUSCULAR | Status: AC
  Filled 2022-07-10: qty 30

## 2022-07-10 MED ORDER — SUCCINYLCHOLINE CHLORIDE 200 MG/10ML IV SOSY
PREFILLED_SYRINGE | INTRAVENOUS | Status: DC | PRN
  Administered 2022-07-10: 100 mg via INTRAVENOUS

## 2022-07-10 MED ORDER — FENTANYL CITRATE (PF) 100 MCG/2ML IJ SOLN
INTRAMUSCULAR | Status: AC
  Filled 2022-07-10: qty 2

## 2022-07-10 MED ORDER — DICLOFENAC SODIUM 1 % EX GEL: 2.0000 g | Freq: Four times a day (QID) | CUTANEOUS | Status: AC | PRN

## 2022-07-10 MED ORDER — ACETAMINOPHEN 500 MG PO TABS
ORAL_TABLET | ORAL | Status: AC
  Filled 2022-07-10: qty 2

## 2022-07-10 MED ORDER — BUPIVACAINE-EPINEPHRINE 0.25% -1:200000 IJ SOLN
INTRAMUSCULAR | Status: DC | PRN
  Administered 2022-07-10: 10 mL

## 2022-07-10 MED ORDER — ACETAMINOPHEN 500 MG PO TABS
1000.0000 mg | ORAL_TABLET | Freq: Once | ORAL | Status: AC
  Administered 2022-07-10: 1000 mg via ORAL

## 2022-07-10 MED ORDER — CEFAZOLIN SODIUM-DEXTROSE 2-4 GM/100ML-% IV SOLN
INTRAVENOUS | Status: AC
  Filled 2022-07-10: qty 100

## 2022-07-10 MED ORDER — ONDANSETRON HCL 4 MG/2ML IJ SOLN
INTRAMUSCULAR | Status: AC
  Filled 2022-07-10: qty 2

## 2022-07-10 MED ORDER — PHENYLEPHRINE 80 MCG/ML (10ML) SYRINGE FOR IV PUSH (FOR BLOOD PRESSURE SUPPORT)
PREFILLED_SYRINGE | INTRAVENOUS | Status: AC
  Filled 2022-07-10: qty 10

## 2022-07-10 MED ORDER — ONDANSETRON HCL 4 MG/2ML IJ SOLN
INTRAMUSCULAR | Status: DC | PRN
  Administered 2022-07-10: 4 mg via INTRAVENOUS

## 2022-07-10 MED ORDER — 0.9 % SODIUM CHLORIDE (POUR BTL) OPTIME
TOPICAL | Status: DC | PRN
  Administered 2022-07-10: 500 mL

## 2022-07-10 MED ORDER — PANTOPRAZOLE SODIUM 40 MG PO TBEC: 40.0000 mg | DELAYED_RELEASE_TABLET | Freq: Every day | ORAL | Status: AC | PRN

## 2022-07-10 MED ORDER — EPINEPHRINE PF 1 MG/ML IJ SOLN
INTRAMUSCULAR | Status: AC
  Filled 2022-07-10: qty 1

## 2022-07-10 MED ORDER — LIDOCAINE HCL (CARDIAC) PF 100 MG/5ML IV SOSY
PREFILLED_SYRINGE | INTRAVENOUS | Status: DC | PRN
  Administered 2022-07-10: 100 mg via INTRAVENOUS

## 2022-07-10 MED ORDER — FENTANYL CITRATE (PF) 100 MCG/2ML IJ SOLN
INTRAMUSCULAR | Status: DC | PRN
  Administered 2022-07-10: 50 ug via INTRAVENOUS

## 2022-07-10 SURGICAL SUPPLY — 31 items
APL PRP STRL LF DISP 70% ISPRP (MISCELLANEOUS) ×1
BLADE SURG 15 STRL LF DISP TIS (BLADE) ×1 IMPLANT
BLADE SURG 15 STRL SS (BLADE) ×1
CHLORAPREP W/TINT 26 (MISCELLANEOUS) ×1 IMPLANT
DRAPE LAPAROTOMY 100X77 ABD (DRAPES) ×1 IMPLANT
ELECT REM PT RETURN 9FT ADLT (ELECTROSURGICAL) ×1
ELECTRODE REM PT RTRN 9FT ADLT (ELECTROSURGICAL) ×1 IMPLANT
GAUZE 4X4 16PLY ~~LOC~~+RFID DBL (SPONGE) ×1 IMPLANT
GAUZE SPONGE 4X4 12PLY STRL (GAUZE/BANDAGES/DRESSINGS) ×1 IMPLANT
GLOVE BIOGEL PI IND STRL 7.0 (GLOVE) ×1 IMPLANT
GLOVE SURG SYN 6.5 ES PF (GLOVE) ×2 IMPLANT
GLOVE SURG SYN 6.5 PF PI (GLOVE) ×2 IMPLANT
GOWN STRL REUS W/ TWL LRG LVL3 (GOWN DISPOSABLE) ×2 IMPLANT
GOWN STRL REUS W/TWL LRG LVL3 (GOWN DISPOSABLE) ×2
LABEL OR SOLS (LABEL) ×1 IMPLANT
MANIFOLD NEPTUNE II (INSTRUMENTS) ×1 IMPLANT
NDL HYPO 22X1.5 SAFETY MO (MISCELLANEOUS) ×1 IMPLANT
NEEDLE HYPO 22X1.5 SAFETY MO (MISCELLANEOUS) ×1 IMPLANT
NS IRRIG 500ML POUR BTL (IV SOLUTION) ×1 IMPLANT
PACK BASIN MINOR ARMC (MISCELLANEOUS) ×1 IMPLANT
PACKING GAUZE IODOFORM 1INX5YD (GAUZE/BANDAGES/DRESSINGS) IMPLANT
SUT MNCRL 4-0 (SUTURE) ×1
SUT MNCRL 4-0 27XMFL (SUTURE) ×1
SUT VIC AB 2-0 CT2 27 (SUTURE) ×1 IMPLANT
SUT VIC AB 3-0 SH 27 (SUTURE) ×1
SUT VIC AB 3-0 SH 27X BRD (SUTURE) ×1 IMPLANT
SUTURE MNCRL 4-0 27XMF (SUTURE) ×1 IMPLANT
SYR 10ML LL (SYRINGE) ×1 IMPLANT
TOWEL OR 17X26 4PK STRL BLUE (TOWEL DISPOSABLE) ×1 IMPLANT
TRAP FLUID SMOKE EVACUATOR (MISCELLANEOUS) ×1 IMPLANT
WATER STERILE IRR 500ML POUR (IV SOLUTION) ×1 IMPLANT

## 2022-07-10 NOTE — Transfer of Care (Signed)
Immediate Anesthesia Transfer of Care Note  Patient: Thomas Sampson  Procedure(s) Performed: INCISION AND DRAINAGE ABSCESS furuncle (Back)  Patient Location: PACU  Anesthesia Type:General  Level of Consciousness: awake, alert , and oriented  Airway & Oxygen Therapy: Patient Spontanous Breathing and Patient connected to face mask oxygen  Post-op Assessment: Report given to RN and Post -op Vital signs reviewed and stable  Post vital signs: Reviewed and stable  Last Vitals:  Vitals Value Taken Time  BP    Temp    Pulse 82 07/10/22 1254  Resp 24 07/10/22 1254  SpO2 97 % 07/10/22 1254  Vitals shown include unvalidated device data.  Last Pain:  Vitals:   07/10/22 1056  TempSrc: Temporal  PainSc: 0-No pain         Complications: No notable events documented.

## 2022-07-10 NOTE — Interval H&P Note (Signed)
No change. OK to proceed.

## 2022-07-10 NOTE — Progress Notes (Signed)
   07/10/22 1124  Spiritual Encounters  Type of Visit Initial  Care provided to: Pt and family  Conversation partners present during encounter Nurse  Referral source Chaplain assessment  Reason for visit Routine spiritual support  OnCall Visit No  Spiritual Framework  Presenting Themes Meaning/purpose/sources of inspiration  Community/Connection Family  Interventions  Spiritual Care Interventions Made Established relationship of care and support;Compassionate presence;Reflective listening;Prayer;Encouragement  Intervention Outcomes  Outcomes Connection to spiritual care  Spiritual Care Plan  Spiritual Care Issues Still Outstanding No further spiritual care needs at this time (see row info)  Advance Directives (For Healthcare)  Does Patient Have a Medical Advance Directive? Yes  Does patient want to make changes to medical advance directive? No - Patient declined  Type of Estate agent of [place];Living will  Copy of Healthcare Power of Attorney in Chart? No - copy requested   Chaplain visited with patient and family prior to surgery.  Chaplain showed compassion and empathy. Provided prayer as requested. Chaplain available for follow up as need arises.

## 2022-07-10 NOTE — Discharge Instructions (Addendum)
Removal, Care After This sheet gives you information about how to care for yourself after your procedure. Your health care provider may also give you more specific instructions. If you have problems or questions, contact your health care provider. What can I expect after the procedure? After the procedure, it is common to have: Soreness. Bruising. Itching. Follow these instructions at home: site care Follow instructions from your health care provider about how to take care of your site. Make sure you: Wash your hands with soap and water before and after you change your bandage (dressing). If soap and water are not available, use hand sanitizer. Leave stitches (sutures), skin glue, or adhesive strips in place. These skin closures may need to stay in place for 2 weeks or longer. If adhesive strip edges start to loosen and curl up, you may trim the loose edges. Do not remove adhesive strips completely unless your health care provider tells you to do that. If the area bleeds or bruises, apply gentle pressure for 10 minutes. OK TO SHOWER IN 24HRS  Check your site every day for signs of infection. Check for: Redness, swelling, or pain. Fluid or blood. Warmth. Pus or a bad smell.  General instructions Rest and then return to your normal activities as told by your health care provider.  tylenol as needed for discomfort.    Use narcotics, if prescribed, only when tylenol is not enough to control pain.  325-650mg  every 8hrs to max of 3000mg /24hrs (including the 325mg  in every norco dose) for the tylenol.    DO NOT TAKE PLAVIX UNTIL SEEN IN OFFICE KEEP DRESSING INTACT Keep all follow-up visits as told by your health care provider. This is important. Contact a health care provider if: You have redness, swelling, or pain around your site. You have fluid or blood coming from your site. Your site feels warm to the touch. You have pus or a bad smell coming from your site. You have a fever. Your  sutures, skin glue, or adhesive strips loosen or come off sooner than expected. Get help right away if: You have bleeding that does not stop with pressure or a dressing. Summary After the procedure, it is common to have some soreness, bruising, and itching at the site. Follow instructions from your health care provider about how to take care of your site. Check your site every day for signs of infection. Contact a health care provider if you have redness, swelling, or pain around your site, or your site feels warm to the touch. Keep all follow-up visits as told by your health care provider. This is important. This information is not intended to replace advice given to you by your health care provider. Make sure you discuss any questions you have with your health care provider. Document Released: 02/12/2015 Document Revised: 07/16/2017 Document Reviewed: 07/16/2017 Elsevier Interactive Patient Education  2019 [employer]    AMBULATORY SURGERY  DISCHARGE INSTRUCTIONS   The drugs that you were given will stay in your system until tomorrow so for the next 24 hours you should not:  Drive an automobile Make any legal decisions Drink any alcoholic beverage   You may resume regular meals tomorrow.  Today it is better to start with liquids and gradually work up to solid foods.  You may eat anything you prefer, but it is better to start with liquids, then soup and crackers, and gradually work up to solid foods.   Please notify your doctor immediately if you have any unusual bleeding,  trouble breathing, redness and pain at the surgery site, drainage, fever, or pain not relieved by medication.    Additional Instructions:   Please contact your physician with any problems or Same Day Surgery at [phone], Monday through Friday 6 am to 4 pm, or [place] [facility] number at [phone].

## 2022-07-10 NOTE — Op Note (Signed)
Pre-Op Dx: Back abscess Post-Op Dx: Same Anesthesia: GETA EBL: 20 mL Complications:  none apparent Specimen: Back abscess Procedure: excisional biopsy back abscess Surgeon: Tonna Boehringer  Indications for procedure: See H&P  Description of Procedure:  Consent obtained, time out performed.  Patient placed in right lateral position.  Area sterilized and draped in usual position.  Local infused to area previously marked.  8-1/2 cm elliptical incision made through dermis, around area with active purulent drainage with 15blade and back abscess stemming from infected carbuncle noted in subcutaneous layer.  The 5.7cm x 2cm x 2.3cm deep carbuncle then removed from surrounding tissue completely using electrocautery, passed off field pending pathology.  Wound hemostasis noted, then irrigated, packed with 1 inch iodoform packing, cover with 4 x 4's and secured with paper tape.  Pt tolerated procedure well, and transferred to PACU in stable condition. Sponge and instrument count correct at end of procedure.

## 2022-07-10 NOTE — Anesthesia Procedure Notes (Signed)
Procedure Name: Intubation Date/Time: 07/10/2022 12:04 PM  Performed by: Hezzie Bump, CRNAPre-anesthesia Checklist: Patient identified, Patient being monitored, Timeout performed, Emergency Drugs available and Suction available Patient Re-evaluated:Patient Re-evaluated prior to induction Oxygen Delivery Method: Circle system utilized Preoxygenation: Pre-oxygenation with 100% oxygen Induction Type: IV induction Ventilation: Mask ventilation without difficulty Laryngoscope Size: McGraph and 4 Grade View: Grade I Tube type: Oral Tube size: 7.5 mm Number of attempts: 1 Airway Equipment and Method: Stylet Placement Confirmation: ETT inserted through vocal cords under direct vision, positive ETCO2 and breath sounds checked- equal and bilateral Secured at: 21 cm Tube secured with: Tape Dental Injury: Teeth and Oropharynx as per pre-operative assessment

## 2022-07-10 NOTE — Anesthesia Preprocedure Evaluation (Addendum)
Anesthesia Evaluation  Patient identified by MRN, date of birth, ID band Patient awake    Reviewed: Allergy & Precautions, H&P , NPO status , Patient's Chart, lab work & pertinent test results  Airway Mallampati: II  TM Distance: >3 FB Neck ROM: full    Dental no notable dental hx.    Pulmonary former smoker   Pulmonary exam normal        Cardiovascular Exercise Tolerance: Good hypertension, Normal cardiovascular exam  ECHO  4/23:  1. Left ventricular ejection fraction, by estimation, is 70 to 75%. The  left ventricle has hyperdynamic function. The left ventricle has no  regional wall motion abnormalities. There is mild concentric left  ventricular hypertrophy. Left ventricular  diastolic parameters were normal.   2. Right ventricular systolic function is normal. The right ventricular  size is normal.   3. The mitral valve is normal in structure. Trivial mitral valve  regurgitation.   4. The aortic valve is tricuspid. There is mild thickening of the aortic  valve. Aortic valve regurgitation is not visualized. No aortic stenosis is  present.   5. Aortic dilatation noted. There is mild dilatation of the aortic root,  measuring 40 mm.     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), Residual Symptoms    GI/Hepatic Neg liver ROS,GERD  Controlled,,  Endo/Other  diabetes, Well Controlled, Type 2, Insulin Dependent    Renal/GU Renal InsufficiencyRenal disease     Musculoskeletal  (+) Arthritis ,    Abdominal Normal abdominal exam  (+)   Peds  Hematology negative hematology ROS (+)   Anesthesia Other Findings Past Medical History: 05/10/2020: Acute right MCA stroke (HCC) No date: Arthritis No date: Atelectasis No date: Back abscess No date: Basal cell carcinoma of nose No date: Cataract  cortical, senile, bilateral No date: Chronic kidney disease, stage 4 (severe) (HCC) No date: Diverticulosis No date: GERD (gastroesophageal reflux disease) No date: History of kidney stones No date: Hyperlipidemia No date: Hypertension No date: Pneumonia 09/2020: Seizure disorder (HCC) No date: Tinea pedis No date: Type 2 diabetes mellitus without complication [facility])  Past Surgical History: 2008: APPENDECTOMY     Comment:  with bowel surgery 09/2009: CATARACT EXTRACTION; Left 05/2010: CATARACT EXTRACTION; Right No date: COLONOSCOPY     Comment:  2003, 2008, 2011, 2013 09/18/2016: COLONOSCOPY WITH PROPOFOL; N/A     Comment:  Procedure: COLONOSCOPY WITH PROPOFOL;  Surgeon:               Christena Deem, MD;  Location: [facility] ENDOSCOPY;                Service: Endoscopy;  Laterality: N/A; 05/10/2020: IR CT HEAD LTD 05/10/2020: IR PERCUTANEOUS ART THROMBECTOMY/INFUSION INTRACRANIAL  INC DIAG ANGIO 05/14/2020: LOOP RECORDER INSERTION; N/A     Comment:  Procedure: LOOP RECORDER INSERTION;  Surgeon: Regan Lemming, MD;  Location: [facility] INVASIVE CV LAB;  Service:              Cardiovascular;  Laterality: N/A; 2008: PARTIAL COLECTOMY 1970: PILONIDAL CYST EXCISION 05/10/2020: RADIOLOGY WITH ANESTHESIA; N/A     Comment:  Procedure: IR WITH ANESTHESIA;  Surgeon: Radiologist,               Medication, MD;  Location: [facility] OR;  Service: Radiology;  Laterality: N/A; No date: VASECTOMY  BMI    Body Mass Index: 30.13 kg/m      Reproductive/Obstetrics negative OB ROS                              Anesthesia Physical Anesthesia Plan  ASA: 3  Anesthesia Plan: General ETT   Post-op Pain Management: Tylenol PO (pre-op)* and Minimal or no pain anticipated   Induction: Intravenous  PONV Risk Score and Plan: 2 and Ondansetron and Dexamethasone  Airway Management Planned: Oral ETT  Additional Equipment:   Intra-op Plan:    Post-operative Plan: Extubation in OR  Informed Consent: I have reviewed the patients History and Physical, chart, labs and discussed the procedure including the risks, benefits and alternatives for the proposed anesthesia with the patient or authorized representative who has indicated his/her understanding and acceptance.     Dental Advisory Given  Plan Discussed with: CRNA and Surgeon  Anesthesia Plan Comments:          Anesthesia Quick Evaluation

## 2022-07-11 ENCOUNTER — Encounter: Payer: Self-pay | Admitting: Surgery

## 2022-07-11 LAB — AEROBIC/ANAEROBIC CULTURE W GRAM STAIN (SURGICAL/DEEP WOUND)

## 2022-07-11 NOTE — Anesthesia Postprocedure Evaluation (Signed)
Anesthesia Post Note  Patient: Thomas Sampson  Procedure(s) Performed: INCISION AND DRAINAGE ABSCESS furuncle (Back)  Patient location during evaluation: PACU Anesthesia Type: General Level of consciousness: awake and alert Pain management: pain level controlled Vital Signs Assessment: post-procedure vital signs reviewed and stable Respiratory status: spontaneous breathing, nonlabored ventilation, respiratory function stable and patient connected to nasal cannula oxygen Cardiovascular status: blood pressure returned to baseline and stable Postop Assessment: no apparent nausea or vomiting Anesthetic complications: no   No notable events documented.   Last Vitals:  Vitals:   07/10/22 1335 07/10/22 1340  BP:  (!) 151/73  Pulse: 63 71  Resp: 15 16  Temp:  (!) 36.2 C  SpO2: 93% 97%    Last Pain:  Vitals:   07/11/22 1146  TempSrc:   PainSc: 3                  Cleda Mccreedy Zyairah Wacha

## 2022-07-12 LAB — AEROBIC/ANAEROBIC CULTURE W GRAM STAIN (SURGICAL/DEEP WOUND)

## 2022-07-13 ENCOUNTER — Ambulatory Visit: Payer: Medicare Other

## 2022-07-14 LAB — AEROBIC/ANAEROBIC CULTURE W GRAM STAIN (SURGICAL/DEEP WOUND): Gram Stain: NONE SEEN

## 2022-07-17 LAB — AEROBIC/ANAEROBIC CULTURE W GRAM STAIN (SURGICAL/DEEP WOUND)

## 2022-07-18 LAB — MISC LABCORP TEST (SEND OUT): Labcorp test code: 182345

## 2022-07-20 LAB — MISC LABCORP TEST (SEND OUT): Labcorp test code: 182345

## 2022-07-21 LAB — BACTERIAL ORGANISM REFLEX

## 2022-07-21 LAB — AEROBIC/ANAEROBIC CULTURE W GRAM STAIN (SURGICAL/DEEP WOUND)

## 2022-07-26 NOTE — Progress Notes (Signed)
Carelink Summary Report / Loop Recorder 

## 2022-07-31 LAB — MISC LABCORP TEST (SEND OUT)
LabCorp test name: 182345
Labcorp test code: 182345

## 2022-08-05 LAB — CUP PACEART REMOTE DEVICE CHECK
Date Time Interrogation Session: 20240706000222
Implantable Pulse Generator Implant Date: 20220415

## 2022-08-07 ENCOUNTER — Ambulatory Visit (INDEPENDENT_AMBULATORY_CARE_PROVIDER_SITE_OTHER): Payer: Medicare Other

## 2022-08-07 DIAGNOSIS — I639 Cerebral infarction, unspecified: Secondary | ICD-10-CM | POA: Diagnosis not present

## 2022-08-24 NOTE — Progress Notes (Signed)
Carelink Summary Report / Loop Recorder 

## 2022-09-11 ENCOUNTER — Ambulatory Visit (INDEPENDENT_AMBULATORY_CARE_PROVIDER_SITE_OTHER): Payer: Medicare Other

## 2022-09-11 DIAGNOSIS — I639 Cerebral infarction, unspecified: Secondary | ICD-10-CM

## 2022-09-11 LAB — CUP PACEART REMOTE DEVICE CHECK
Date Time Interrogation Session: 20240812000443
Implantable Pulse Generator Implant Date: 20220415

## 2022-09-26 NOTE — Progress Notes (Signed)
Carelink Summary Report / Loop Recorder 

## 2022-10-16 ENCOUNTER — Ambulatory Visit (INDEPENDENT_AMBULATORY_CARE_PROVIDER_SITE_OTHER): Payer: Medicare Other

## 2022-10-16 DIAGNOSIS — I639 Cerebral infarction, unspecified: Secondary | ICD-10-CM | POA: Diagnosis not present

## 2022-10-17 LAB — CUP PACEART REMOTE DEVICE CHECK
Date Time Interrogation Session: 20240914000544
Implantable Pulse Generator Implant Date: 20220415

## 2022-11-01 NOTE — Progress Notes (Signed)
Carelink Summary Report / Loop Recorder 

## 2022-11-09 LAB — ORGANISM ID, BACTERIA

## 2022-11-20 ENCOUNTER — Ambulatory Visit: Payer: Medicare Other

## 2022-11-20 DIAGNOSIS — I639 Cerebral infarction, unspecified: Secondary | ICD-10-CM | POA: Diagnosis not present

## 2022-11-20 LAB — CUP PACEART REMOTE DEVICE CHECK
Date Time Interrogation Session: 20241021000346
Implantable Pulse Generator Implant Date: 20220415

## 2022-12-07 NOTE — Progress Notes (Signed)
Carelink Summary Report / Loop Recorder 

## 2022-12-25 ENCOUNTER — Ambulatory Visit (INDEPENDENT_AMBULATORY_CARE_PROVIDER_SITE_OTHER): Payer: Medicare Other

## 2022-12-25 DIAGNOSIS — I639 Cerebral infarction, unspecified: Secondary | ICD-10-CM

## 2022-12-25 LAB — CUP PACEART REMOTE DEVICE CHECK
Date Time Interrogation Session: 20241123000014
Implantable Pulse Generator Implant Date: 20220415

## 2023-01-22 NOTE — Progress Notes (Signed)
Carelink Summary Report / Loop Recorder 

## 2023-01-29 ENCOUNTER — Ambulatory Visit (INDEPENDENT_AMBULATORY_CARE_PROVIDER_SITE_OTHER): Payer: Medicare Other

## 2023-01-29 DIAGNOSIS — I639 Cerebral infarction, unspecified: Secondary | ICD-10-CM

## 2023-01-29 LAB — CUP PACEART REMOTE DEVICE CHECK
Date Time Interrogation Session: 20241228000203
Implantable Pulse Generator Implant Date: 20220415

## 2023-03-05 ENCOUNTER — Ambulatory Visit (INDEPENDENT_AMBULATORY_CARE_PROVIDER_SITE_OTHER): Payer: Medicare Other

## 2023-03-05 DIAGNOSIS — I639 Cerebral infarction, unspecified: Secondary | ICD-10-CM

## 2023-03-05 LAB — CUP PACEART REMOTE DEVICE CHECK
Date Time Interrogation Session: 20250203000519
Implantable Pulse Generator Implant Date: 20220415

## 2023-04-09 ENCOUNTER — Ambulatory Visit (INDEPENDENT_AMBULATORY_CARE_PROVIDER_SITE_OTHER): Payer: Medicare Other

## 2023-04-09 DIAGNOSIS — I639 Cerebral infarction, unspecified: Secondary | ICD-10-CM

## 2023-04-09 LAB — CUP PACEART REMOTE DEVICE CHECK
Date Time Interrogation Session: 20250310000335
Implantable Pulse Generator Implant Date: 20220415

## 2023-04-12 NOTE — Progress Notes (Signed)
 [facility] Summary Report / Loop Recorder

## 2023-05-14 ENCOUNTER — Ambulatory Visit (INDEPENDENT_AMBULATORY_CARE_PROVIDER_SITE_OTHER): Payer: Medicare Other

## 2023-05-14 DIAGNOSIS — I639 Cerebral infarction, unspecified: Secondary | ICD-10-CM | POA: Diagnosis not present

## 2023-05-14 LAB — CUP PACEART REMOTE DEVICE CHECK
Date Time Interrogation Session: 20250414000310
Implantable Pulse Generator Implant Date: 20220415

## 2023-05-29 NOTE — Progress Notes (Signed)
 [facility] Summary Report / Loop Recorder

## 2023-06-18 ENCOUNTER — Ambulatory Visit (INDEPENDENT_AMBULATORY_CARE_PROVIDER_SITE_OTHER): Payer: Medicare Other

## 2023-06-18 DIAGNOSIS — I639 Cerebral infarction, unspecified: Secondary | ICD-10-CM | POA: Diagnosis not present

## 2023-06-19 LAB — CUP PACEART REMOTE DEVICE CHECK
Date Time Interrogation Session: 20250519000517
Implantable Pulse Generator Implant Date: 20220415

## 2023-06-20 ENCOUNTER — Ambulatory Visit: Payer: Self-pay | Admitting: Cardiology

## 2023-07-05 NOTE — Progress Notes (Signed)
 [facility] Summary Report / Loop Recorder

## 2023-07-19 ENCOUNTER — Ambulatory Visit: Payer: Self-pay | Admitting: Cardiology

## 2023-07-19 ENCOUNTER — Ambulatory Visit (INDEPENDENT_AMBULATORY_CARE_PROVIDER_SITE_OTHER): Payer: Self-pay

## 2023-07-19 DIAGNOSIS — I639 Cerebral infarction, unspecified: Secondary | ICD-10-CM | POA: Diagnosis not present

## 2023-07-19 LAB — CUP PACEART REMOTE DEVICE CHECK
Date Time Interrogation Session: 20250619000457
Implantable Pulse Generator Implant Date: 20220415

## 2023-08-10 NOTE — Progress Notes (Signed)
 [facility] Summary Report / Loop Recorder

## 2023-08-10 NOTE — Addendum Note (Signed)
 Addended by: TAWNI DRILLING D on: 08/10/2023 11:58 AM   Modules accepted: Orders

## 2023-08-20 ENCOUNTER — Ambulatory Visit: Payer: Self-pay

## 2023-08-20 DIAGNOSIS — I639 Cerebral infarction, unspecified: Secondary | ICD-10-CM

## 2023-08-21 LAB — CUP PACEART REMOTE DEVICE CHECK
Date Time Interrogation Session: 20250721000814
Implantable Pulse Generator Implant Date: 20220415

## 2023-08-26 NOTE — H&P (Signed)
 Pre-Procedure H&P   Patient ID: Thomas Sampson is a 78 y.o. male.  Gastroenterology Provider: [name], DO  Referring Provider: Romero Antigua, PA PCP: Rudolpho Norleen BIRCH, MD  Date: 08/27/2023  HPI [name] is a 78 y.o. male who presents today for Colonoscopy for Personal history of colon polyps; constipation .  Patient with a history of advanced polyps-3 cm TVA in 2008 status post partial colectomy.  Anastomosis noted in the transverse colon on the last colonoscopy  His last colonoscopy was August 2018 with left-sided diverticulosis.  Biopsies negative for microscopic colitis in setting of fair prep.  Grade 2 hemorrhoids also noted.  Notes some constipation.  No melena or hematochezia.  Occasional bright red blood with wiping.  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  Hemoglobin 15.2 MCV 95.8 platelets 225,000 creatinine 1.4  Plavix  held for procedure   Past Medical History:  Diagnosis Date   Acute right MCA stroke (HCC) 05/10/2020   Arthritis    Atelectasis    Back abscess    Basal cell carcinoma of nose    Cataract cortical, senile, bilateral    Chronic kidney disease, stage 4 (severe) (HCC)    Diverticulosis    GERD (gastroesophageal reflux disease)    History of kidney stones    Hyperlipidemia    Hypertension    Pneumonia    Seizure disorder (HCC) 09/2020   Tinea pedis    Type 2 diabetes mellitus without complication [facility])     Past Surgical History:  Procedure Laterality Date   APPENDECTOMY  2008   with bowel surgery   CATARACT EXTRACTION Left 09/2009   CATARACT EXTRACTION Right 05/2010   COLONOSCOPY     2003, 2008, 2011, 2013   COLONOSCOPY WITH PROPOFOL  N/A 09/18/2016   Procedure: COLONOSCOPY WITH PROPOFOL ;  Surgeon: Gaylyn Gladis PENNER, MD;  Location: [facility] ENDOSCOPY;  Service: Endoscopy;  Laterality: N/A;   INCISION AND DRAINAGE ABSCESS N/A 07/10/2022   Procedure: INCISION AND DRAINAGE ABSCESS furuncle;  Surgeon: Tye Millet, DO;  Location: [facility] ORS;  Service: General;  Laterality: N/A;   IR CT HEAD LTD  05/10/2020   IR PERCUTANEOUS ART THROMBECTOMY/INFUSION INTRACRANIAL INC DIAG ANGIO  05/10/2020   LOOP RECORDER INSERTION N/A 05/14/2020   Procedure: LOOP RECORDER INSERTION;  Surgeon: Inocencio Soyla Gladis, MD;  Location: [facility] INVASIVE CV LAB;  Service: Cardiovascular;  Laterality: N/A;   PARTIAL COLECTOMY  2008   PILONIDAL CYST EXCISION  1970   RADIOLOGY WITH ANESTHESIA N/A 05/10/2020   Procedure: IR WITH ANESTHESIA;  Surgeon: Radiologist, Medication, MD;  Location: [facility] OR;  Service: Radiology;  Laterality: N/A;   VASECTOMY      Family History No h/o GI disease or malignancy  Review of Systems  Constitutional:  Negative for activity change, appetite change, chills, diaphoresis, fatigue, fever and unexpected weight change.  HENT:  Negative for trouble swallowing and voice change.   Respiratory:  Negative for shortness of breath and wheezing.   Cardiovascular:  Negative for chest pain, palpitations and leg swelling.  Gastrointestinal:  Positive for constipation. Negative for abdominal distention, abdominal pain, anal bleeding, blood in stool, diarrhea, nausea and vomiting.  Musculoskeletal:  Negative for arthralgias and myalgias.  Skin:  Negative for color change and pallor.  Neurological:  Positive for weakness (residual L hemiparesis). Negative for dizziness and syncope.  Psychiatric/Behavioral:  Negative for confusion. The patient is not nervous/anxious.   All other systems reviewed and are negative.    Medications No current  facility-administered medications on file prior to encounter.   Current Outpatient Medications on File Prior to Encounter  Medication Sig Dispense Refill   acetaminophen  (TYLENOL ) 325 MG tablet Take 2 tablets (650 mg total) by mouth every 4 (four) hours as needed for mild pain (or temp > 37.5 C (99.5 F)).     Alpha-Lipoic Acid 300 MG TABS Take 1 tablet by mouth 2 (two) times  daily.     amLODipine  (NORVASC ) 5 MG tablet Take 1 tablet (5 mg total) by mouth daily. 30 tablet 0   atorvastatin  (LIPITOR) 40 MG tablet Take 1 tablet (40 mg total) by mouth daily. 30 tablet 0   Calcium -Vitamin D-Vitamin K (CALCIUM  SOFT CHEWS) 500-500-40 MG-UNT-MCG CHEW Chew 1 tablet by mouth at bedtime.     Cholecalciferol  50 MCG (2000 UT) CAPS Take 1 capsule by mouth daily.     cyanocobalamin  1000 MCG tablet Take 1 tablet (1,000 mcg total) by mouth daily. 30 tablet 0   diclofenac  Sodium (VOLTAREN ) 1 % GEL Apply 2 g topically 4 (four) times daily as needed.     econazole nitrate 1 % cream Apply 1 Application topically daily as needed.     insulin  aspart (NOVOLOG ) 100 UNIT/ML FlexPen Inject 8 Units into the skin 3 (three) times daily with meals.     insulin  glargine (LANTUS ) 100 UNIT/ML injection Inject 30 Units into the skin at bedtime.     levETIRAcetam (KEPPRA) 750 MG tablet Take 750 mg by mouth 2 (two) times daily.     lidocaine  (LIDODERM ) 5 % Place 1 patch onto the skin daily as needed. Remove & Discard patch within 12 hours or as directed by MD     Lifitegrast (XIIDRA) 5 % SOLN Place 1 drop into both eyes 2 (two) times daily as needed.     losartan (COZAAR) 25 MG tablet Take 25 mg by mouth daily.     MULTIPLE VITAMIN PO Take 1 tablet by mouth daily.     oxcarbazepine (TRILEPTAL) 600 MG tablet Take 600 mg by mouth 2 (two) times daily.     pantoprazole  (PROTONIX ) 40 MG tablet Take 1 tablet (40 mg total) by mouth daily as needed.     pregabalin  (LYRICA ) 25 MG capsule Take 1 capsule (25 mg total) by mouth 2 (two) times daily. 60 capsule 0   sertraline (ZOLOFT) 25 MG tablet Take 25 mg by mouth daily.      Pertinent medications related to GI and procedure were reviewed by me with the patient prior to the procedure   Current Facility-Administered Medications:    0.9 %  sodium chloride  infusion, , Intravenous, Continuous, Onita Elspeth Sharper, DO, Last Rate: 20 mL/hr at 08/27/23 0700, 20  mL/hr at 08/27/23 0700  sodium chloride  20 mL/hr (08/27/23 0700)       Allergies  Allergen Reactions   Codeine Nausea Only   Meperidine Nausea Only    Upset stomach   Allergies were reviewed by me prior to the procedure  Objective   Body mass index is 28.97 kg/m. Vitals:   08/27/23 0645  BP: 136/67  Pulse: 67  Resp: 20  Temp: (!) 96.9 F (36.1 C)  TempSrc: Temporal  SpO2: 96%  Weight: 89 kg  Height: 5' 9 (1.753 m)     Physical Exam Vitals and nursing note reviewed.  Constitutional:      General: He is not in acute distress.    Appearance: Normal appearance. He is not ill-appearing, toxic-appearing or diaphoretic.  HENT:  Head: Normocephalic and atraumatic.     Nose: Nose normal.     Mouth/Throat:     Mouth: Mucous membranes are moist.     Pharynx: Oropharynx is clear.  Eyes:     General: No scleral icterus.    Extraocular Movements: Extraocular movements intact.  Cardiovascular:     Rate and Rhythm: Normal rate and regular rhythm.     Heart sounds: Normal heart sounds. No murmur heard.    No friction rub. No gallop.     Comments: Loop recorder Pulmonary:     Effort: Pulmonary effort is normal. No respiratory distress.     Breath sounds: Normal breath sounds. No wheezing, rhonchi or rales.  Abdominal:     General: Bowel sounds are normal. There is no distension.     Palpations: Abdomen is soft.     Tenderness: There is no abdominal tenderness. There is no guarding or rebound.  Musculoskeletal:     Cervical back: Neck supple.     Right lower leg: No edema.     Left lower leg: No edema.  Skin:    General: Skin is warm and dry.     Coloration: Skin is not jaundiced or pale.  Neurological:     Mental Status: He is alert and oriented to person, place, and time. Mental status is at baseline.     Comments: Left-sided hemiparesis  Psychiatric:        Mood and Affect: Mood normal.        Behavior: Behavior normal.        Thought Content: Thought content  normal.        Judgment: Judgment normal.      Assessment:  Mr. PEARCE LITTLEFIELD is a 78 y.o. male  who presents today for Colonoscopy for Personal history of colon polyps; constipation .  Plan:  Colonoscopy with possible intervention today  Colonoscopy with possible biopsy, control of bleeding, polypectomy, and interventions as necessary has been discussed with the patient/patient representative. Informed consent was obtained from the patient/patient representative after explaining the indication, nature, and risks of the procedure including but not limited to death, bleeding, perforation, missed neoplasm/lesions, cardiorespiratory compromise, and reaction to medications. Opportunity for questions was given and appropriate answers were provided. Patient/patient representative has verbalized understanding is amenable to undergoing the procedure.   [name], DO  [facility]  Portions of the record may have been created with voice recognition software. Occasional wrong-word or 'sound-a-like' substitutions may have occurred due to the inherent limitations of voice recognition software.  Read the chart carefully and recognize, using context, where substitutions may have occurred.

## 2023-08-27 ENCOUNTER — Ambulatory Visit: Payer: Self-pay | Admitting: Cardiology

## 2023-08-27 ENCOUNTER — Ambulatory Visit: Admitting: Registered Nurse

## 2023-08-27 ENCOUNTER — Ambulatory Visit
Admission: RE | Admit: 2023-08-27 | Discharge: 2023-08-27 | Disposition: A | Discharge status: Home / Self Care | Attending: Gastroenterology | Admitting: Gastroenterology

## 2023-08-27 ENCOUNTER — Encounter: Payer: Self-pay | Admitting: Gastroenterology

## 2023-08-27 ENCOUNTER — Encounter
Admission: RE | Disposition: A | Discharge status: Home / Self Care | Payer: Self-pay | Source: Home / Self Care | Attending: Gastroenterology

## 2023-08-27 DIAGNOSIS — E1122 Type 2 diabetes mellitus with diabetic chronic kidney disease: Secondary | ICD-10-CM | POA: Diagnosis not present

## 2023-08-27 DIAGNOSIS — Z9049 Acquired absence of other specified parts of digestive tract: Secondary | ICD-10-CM | POA: Insufficient documentation

## 2023-08-27 DIAGNOSIS — Z1211 Encounter for screening for malignant neoplasm of colon: Secondary | ICD-10-CM | POA: Insufficient documentation

## 2023-08-27 DIAGNOSIS — Z794 Long term (current) use of insulin: Secondary | ICD-10-CM | POA: Diagnosis not present

## 2023-08-27 DIAGNOSIS — I69354 Hemiplegia and hemiparesis following cerebral infarction affecting left non-dominant side: Secondary | ICD-10-CM | POA: Insufficient documentation

## 2023-08-27 DIAGNOSIS — Z7902 Long term (current) use of antithrombotics/antiplatelets: Secondary | ICD-10-CM | POA: Diagnosis not present

## 2023-08-27 DIAGNOSIS — G40909 Epilepsy, unspecified, not intractable, without status epilepticus: Secondary | ICD-10-CM | POA: Diagnosis not present

## 2023-08-27 DIAGNOSIS — K5900 Constipation, unspecified: Secondary | ICD-10-CM | POA: Insufficient documentation

## 2023-08-27 DIAGNOSIS — K635 Polyp of colon: Secondary | ICD-10-CM | POA: Insufficient documentation

## 2023-08-27 DIAGNOSIS — K641 Second degree hemorrhoids: Secondary | ICD-10-CM | POA: Insufficient documentation

## 2023-08-27 DIAGNOSIS — K5730 Diverticulosis of large intestine without perforation or abscess without bleeding: Secondary | ICD-10-CM | POA: Insufficient documentation

## 2023-08-27 DIAGNOSIS — D124 Benign neoplasm of descending colon: Secondary | ICD-10-CM | POA: Diagnosis not present

## 2023-08-27 DIAGNOSIS — Z980 Intestinal bypass and anastomosis status: Secondary | ICD-10-CM | POA: Insufficient documentation

## 2023-08-27 DIAGNOSIS — K219 Gastro-esophageal reflux disease without esophagitis: Secondary | ICD-10-CM | POA: Insufficient documentation

## 2023-08-27 DIAGNOSIS — N184 Chronic kidney disease, stage 4 (severe): Secondary | ICD-10-CM | POA: Insufficient documentation

## 2023-08-27 DIAGNOSIS — I129 Hypertensive chronic kidney disease with stage 1 through stage 4 chronic kidney disease, or unspecified chronic kidney disease: Secondary | ICD-10-CM | POA: Diagnosis not present

## 2023-08-27 DIAGNOSIS — Z87891 Personal history of nicotine dependence: Secondary | ICD-10-CM | POA: Insufficient documentation

## 2023-08-27 HISTORY — PX: POLYPECTOMY: SHX149

## 2023-08-27 HISTORY — PX: COLONOSCOPY: SHX5424

## 2023-08-27 LAB — GLUCOSE, CAPILLARY: Glucose-Capillary: 140 mg/dL — ABNORMAL HIGH (ref 70–99)

## 2023-08-27 SURGERY — COLONOSCOPY
Anesthesia: General

## 2023-08-27 MED ORDER — PROPOFOL 10 MG/ML IV BOLUS
INTRAVENOUS | Status: DC | PRN
  Administered 2023-08-27: 100 ug/kg/min via INTRAVENOUS
  Administered 2023-08-27: 50 mg via INTRAVENOUS

## 2023-08-27 MED ORDER — PROPOFOL 1000 MG/100ML IV EMUL
INTRAVENOUS | Status: AC
  Filled 2023-08-27: qty 100

## 2023-08-27 MED ORDER — SODIUM CHLORIDE 0.9 % IV SOLN
INTRAVENOUS | Status: DC
  Administered 2023-08-27: 20 mL/h via INTRAVENOUS

## 2023-08-27 MED ORDER — LIDOCAINE HCL (PF) 2 % IJ SOLN
INTRAMUSCULAR | Status: AC
  Filled 2023-08-27: qty 5

## 2023-08-27 MED ORDER — LIDOCAINE HCL (CARDIAC) PF 100 MG/5ML IV SOSY
PREFILLED_SYRINGE | INTRAVENOUS | Status: DC | PRN
  Administered 2023-08-27: 60 mg via INTRAVENOUS

## 2023-08-27 NOTE — Op Note (Signed)
 [facility]astroenterology Patient Name: Thomas Sampson Procedure Date: 08/27/2023 7:48 AM MRN: [id number] Account #: [id number] Date of Birth: 1945-11-23 Admit Type: Outpatient Age: 78 Room: [facility] ENDO ROOM 1 Gender: Male Note Status: Supervisor Override Instrument Name: Arvis [id number] Procedure:             Colonoscopy Indications:           High risk colon cancer surveillance: Personal history                         of adenoma (10 mm or greater in size), High risk colon                         cancer surveillance: Personal history of adenoma with                         villous component, Constipation Providers:             Elspeth Ozell Onita ROSALEA, DO Referring MD:          Norleen RONAL Louder, MD (Referring MD) Medicines:             Monitored Anesthesia Care Complications:         No immediate complications. Estimated blood loss:                         Minimal. Procedure:             Pre-Anesthesia Assessment:                        - Prior to the procedure, a History and Physical was                         performed, and patient medications and allergies were                         reviewed. The patient is competent. The risks and                         benefits of the procedure and the sedation options and                         risks were discussed with the patient. All questions                         were answered and informed consent was obtained.                         Patient identification and proposed procedure were                         verified by the physician, the nurse, the anesthetist                         and the technician in the endoscopy suite. Mental                         Status Examination: alert and oriented. Airway  Examination: normal oropharyngeal airway and neck                         mobility. Respiratory Examination: clear to                         auscultation. CV Examination: RRR, no  murmurs, no S3                         or S4. Prophylactic Antibiotics: The patient does not                         require prophylactic antibiotics. Prior                         Anticoagulants: The patient has taken Plavix                          (clopidogrel ), last dose was 7 days prior to                         procedure. ASA Grade Assessment: III - A patient with                         severe systemic disease. After reviewing the risks and                         benefits, the patient was deemed in satisfactory                         condition to undergo the procedure. The anesthesia                         plan was to use monitored anesthesia care (MAC).                         Immediately prior to administration of medications,                         the patient was re-assessed for adequacy to receive                         sedatives. The heart rate, respiratory rate, oxygen                         saturations, blood pressure, adequacy of pulmonary                         ventilation, and response to care were monitored                         throughout the procedure. The physical status of the                         patient was re-assessed after the procedure.                        After obtaining informed consent, the colonoscope was  passed under direct vision. Throughout the procedure,                         the patient's blood pressure, pulse, and oxygen                         saturations were monitored continuously. The                         Colonoscope was introduced through the anus and                         advanced to the the ileocolonic anastomosis. The                         colonoscopy was performed without difficulty. The                         patient tolerated the procedure well. The quality of                         the bowel preparation was adequate. The rectum,                         iliocoonic anastamosis, neo terminal  ileum were                         photographed. Findings:      The perianal and digital rectal examinations were normal. Pertinent       negatives include normal sphincter tone.      The neo-terminal ileum appeared normal. Estimated blood loss: none.      There was evidence of a prior end-to-side ileo-colonic anastomosis in       the transverse colon. This was patent and was characterized by healthy       appearing mucosa. The anastomosis was traversed. Estimated blood loss:       none.      Two sessile polyps were found in the sigmoid colon and descending colon.       The polyps were 3 to 4 mm in size. These polyps were removed with a cold       snare. Resection and retrieval were complete. Estimated blood loss was       minimal.      A 1 to 2 mm polyp was found in the sigmoid colon. The polyp was sessile.       The polyp was removed with a jumbo cold forceps. Resection and retrieval       were complete. Estimated blood loss was minimal.      Multiple small-mouthed diverticula were found in the left colon.       Estimated blood loss: none.      Non-bleeding internal hemorrhoids were found during retroflexion. The       hemorrhoids were Grade II (internal hemorrhoids that prolapse but reduce       spontaneously). Estimated blood loss: none. Impression:            - The examined portion of the ileum was normal.                        - Patent end-to-side ileo-colonic anastomosis,  characterized by healthy appearing mucosa.                        - Two 3 to 4 mm polyps in the sigmoid colon and in the                         descending colon, removed with a cold snare. Resected                         and retrieved.                        - One 1 to 2 mm polyp in the sigmoid colon, removed                         with a jumbo cold forceps. Resected and retrieved.                        - Diverticulosis in the left colon.                        - Non-bleeding  internal hemorrhoids. Recommendation:        - Patient has a contact number available for                         emergencies. The signs and symptoms of potential                         delayed complications were discussed with the patient.                         Return to normal activities tomorrow. Written                         discharge instructions were provided to the patient.                        - Discharge patient to home.                        - Resume previous diet.                        - Continue present medications.                        - No ibuprofen, naproxen, or other non-steroidal                         anti-inflammatory drugs for 5 days after polyp removal.                        - Resume Plavix  (clopidogrel ) at prior dose tomorrow.                         Refer to managing physician for further adjustment of                         therapy.                        -  Await pathology results.                        - No repeat colonoscopy indicated secondary to age                         (next woudl be due in 5 years)                        - Return to referring physician as previously                         scheduled.                        - The findings and recommendations were discussed with                         the patient.                        - The findings and recommendations were discussed with                         the patient's family. Procedure Code(s):     --- Professional ---                        [phone], Colonoscopy, flexible; with removal of                         tumor(s), polyp(s), or other lesion(s) by snare                         technique                        45380, 59, Colonoscopy, flexible; with biopsy, single                         or multiple Diagnosis Code(s):     --- Professional ---                        K64.1, Second degree hemorrhoids                        Z98.0, Intestinal bypass and anastomosis status                         D12.5, Benign neoplasm of sigmoid colon                        D12.4, Benign neoplasm of descending colon                        Z86.010, Personal history of colonic polyps                        K57.30, Diverticulosis of large intestine without                         perforation or abscess without bleeding CPT copyright 2022 American  Medical Association. All rights reserved. The codes documented in this report are preliminary and upon coder review may  be revised to meet current compliance requirements. Attending Participation:      I personally performed the entire procedure. Elspeth Jungling, DO [name] DO, DO 08/27/2023 8:23:10 AM This report has been signed electronically. Number of Addenda: 0 Note Initiated On: 08/27/2023 7:48 AM Scope Withdrawal Time: 0 hours 16 minutes 39 seconds  Total Procedure Duration: 0 hours 19 minutes 15 seconds  Estimated Blood Loss:  Estimated blood loss was minimal.      [facility]

## 2023-08-27 NOTE — Transfer of Care (Signed)
 Immediate Anesthesia Transfer of Care Note  Patient: Thomas Sampson  Procedure(s) Performed: COLONOSCOPY POLYPECTOMY, INTESTINE  Patient Location: Endoscopy Unit  Anesthesia Type:General  Level of Consciousness: drowsy and patient cooperative  Airway & Oxygen Therapy: Patient Spontanous Breathing  Post-op Assessment: Report given to RN and Post -op Vital signs reviewed and stable  Post vital signs: Reviewed and stable  Last Vitals:  Vitals Value Taken Time  BP 110/67 08/27/23 08:19  Temp    Pulse 59 08/27/23 08:19  Resp 19 08/27/23 08:19  SpO2 98 % 08/27/23 08:19    Last Pain:  Vitals:   08/27/23 0819  TempSrc:   PainSc: 0-No pain         Complications: No notable events documented.

## 2023-08-27 NOTE — Interval H&P Note (Signed)
 History and Physical Interval Note: Preprocedure H&P from 08/27/23  was reviewed and there was no interval change after seeing and examining the patient.  Written consent was obtained from the patient after discussion of risks, benefits, and alternatives. Patient has consented to proceed with Colonoscopy with possible intervention   08/27/2023 7:41 AM  Thomas Sampson  has presented today for surgery, with the diagnosis of History of colon polyps (Z86.0100) Antiplatelet or antithrombotic long-term use (Z79.02) Constipation, unspecified constipation type (K59.00).  The various methods of treatment have been discussed with the patient and family. After consideration of risks, benefits and other options for treatment, the patient has consented to  Procedure(s): COLONOSCOPY (N/A) as a surgical intervention.  The patient's history has been reviewed, patient examined, no change in status, stable for surgery.  I have reviewed the patient's chart and labs.  Questions were answered to the patient's satisfaction.     [name]

## 2023-08-27 NOTE — Anesthesia Preprocedure Evaluation (Addendum)
 Anesthesia Evaluation  Patient identified by MRN, date of birth, ID band Patient awake    Reviewed: Allergy & Precautions, H&P , NPO status , Patient's Chart, lab work & pertinent test results  Airway Mallampati: III  TM Distance: >3 FB Neck ROM: full    Dental no notable dental hx.    Pulmonary former smoker   Pulmonary exam normal        Cardiovascular Exercise Tolerance: Good hypertension, Normal cardiovascular exam  ECHO  4/23:  1. Left ventricular ejection fraction, by estimation, is 70 to 75%. The  left ventricle has hyperdynamic function. The left ventricle has no  regional wall motion abnormalities. There is mild concentric left  ventricular hypertrophy. Left ventricular  diastolic parameters were normal.   2. Right ventricular systolic function is normal. The right ventricular  size is normal.   3. The mitral valve is normal in structure. Trivial mitral valve  regurgitation.   4. The aortic valve is tricuspid. There is mild thickening of the aortic  valve. Aortic valve regurgitation is not visualized. No aortic stenosis is  present.   5. Aortic dilatation noted. There is mild dilatation of the aortic root,  measuring 40 mm.     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), Residual Symptoms    GI/Hepatic Neg liver ROS,GERD  Controlled,,  Endo/Other  diabetes, Well Controlled, Type 2, Insulin  Dependent    Renal/GU Renal InsufficiencyRenal disease     Musculoskeletal  (+) Arthritis ,    Abdominal Normal abdominal exam  (+)   Peds  Hematology negative hematology ROS (+)   Anesthesia Other Findings Past Medical History: 05/10/2020: Acute right MCA stroke (HCC) No date: Arthritis No date: Atelectasis No date: Back abscess No date: Basal cell carcinoma of nose No date:  Cataract cortical, senile, bilateral No date: Chronic kidney disease, stage 4 (severe) (HCC) No date: Diverticulosis No date: GERD (gastroesophageal reflux disease) No date: History of kidney stones No date: Hyperlipidemia No date: Hypertension No date: Pneumonia 09/2020: Seizure disorder (HCC) No date: Tinea pedis No date: Type 2 diabetes mellitus without complication [facility])  Past Surgical History: 2008: APPENDECTOMY     Comment:  with bowel surgery 09/2009: CATARACT EXTRACTION; Left 05/2010: CATARACT EXTRACTION; Right No date: COLONOSCOPY     Comment:  2003, 2008, 2011, 2013 09/18/2016: COLONOSCOPY WITH PROPOFOL ; N/A     Comment:  Procedure: COLONOSCOPY WITH PROPOFOL ;  Surgeon:               Gaylyn Gladis PENNER, MD;  Location: [facility] ENDOSCOPY;                Service: Endoscopy;  Laterality: N/A; 05/10/2020: IR CT HEAD LTD 05/10/2020: IR PERCUTANEOUS ART THROMBECTOMY/INFUSION INTRACRANIAL  INC DIAG ANGIO 05/14/2020: LOOP RECORDER INSERTION; N/A     Comment:  Procedure: LOOP RECORDER INSERTION;  Surgeon: Inocencio Soyla Gladis, MD;  Location: [facility] INVASIVE CV LAB;  Service:              Cardiovascular;  Laterality: N/A; 2008: PARTIAL COLECTOMY 1970: PILONIDAL CYST EXCISION 05/10/2020: RADIOLOGY WITH ANESTHESIA; N/A     Comment:  Procedure: IR WITH ANESTHESIA;  Surgeon: Radiologist,               Medication, MD;  Location: [facility] OR;  Service: Radiology;  Laterality: N/A; No date: VASECTOMY  BMI    Body Mass Index: 30.13 kg/m      Reproductive/Obstetrics negative OB ROS                              Anesthesia Physical Anesthesia Plan  ASA: 3  Anesthesia Plan: General   Post-op Pain Management: Tylenol  PO (pre-op)* and Minimal or no pain anticipated   Induction: Intravenous  PONV Risk Score and Plan: 2 and Propofol  infusion and TIVA  Airway Management Planned: Natural Airway  Additional Equipment:   Intra-op Plan:    Post-operative Plan:   Informed Consent: I have reviewed the patients History and Physical, chart, labs and discussed the procedure including the risks, benefits and alternatives for the proposed anesthesia with the patient or authorized representative who has indicated his/her understanding and acceptance.     Dental Advisory Given  Plan Discussed with: CRNA and Surgeon  Anesthesia Plan Comments:          Anesthesia Quick Evaluation

## 2023-08-27 NOTE — Anesthesia Postprocedure Evaluation (Signed)
 Anesthesia Post Note  Patient: [name]  Procedure(s) Performed: COLONOSCOPY POLYPECTOMY, INTESTINE  Patient location during evaluation: PACU Anesthesia Type: General Level of consciousness: awake and alert Pain management: pain level controlled Vital Signs Assessment: post-procedure vital signs reviewed and stable Respiratory status: spontaneous breathing, nonlabored ventilation and respiratory function stable Cardiovascular status: blood pressure returned to baseline and stable Postop Assessment: no apparent nausea or vomiting Anesthetic complications: no   No notable events documented.   Last Vitals:  Vitals:   08/27/23 0828 08/27/23 0838  BP: 122/86 138/70  Pulse: 70 60  Resp: (!) 24 (!) 21  Temp:    SpO2: 97% 96%    Last Pain:  Vitals:   08/27/23 0819  TempSrc: Temporal  PainSc: 0-No pain                 Camellia Merilee Louder

## 2023-08-27 NOTE — Anesthesia Procedure Notes (Signed)
 Procedure Name: MAC Date/Time: 08/27/2023 7:55 AM  Performed by: Lorrene Camelia LABOR, CRNAPre-anesthesia Checklist: Patient identified, Emergency Drugs available, Suction available and Patient being monitored Patient Re-evaluated:Patient Re-evaluated prior to induction Oxygen Delivery Method: Simple face mask Preoxygenation: Pre-oxygenation with 100% oxygen Induction Type: IV induction

## 2023-08-28 ENCOUNTER — Encounter: Payer: Self-pay | Admitting: Gastroenterology

## 2023-08-28 LAB — SURGICAL PATHOLOGY

## 2023-09-18 NOTE — Progress Notes (Signed)
 [facility] Summary Report / Loop Recorder

## 2023-09-20 ENCOUNTER — Ambulatory Visit: Payer: Self-pay | Admitting: Cardiology

## 2023-09-20 ENCOUNTER — Ambulatory Visit (INDEPENDENT_AMBULATORY_CARE_PROVIDER_SITE_OTHER): Payer: Self-pay

## 2023-09-20 DIAGNOSIS — I639 Cerebral infarction, unspecified: Secondary | ICD-10-CM

## 2023-09-20 LAB — CUP PACEART REMOTE DEVICE CHECK
Date Time Interrogation Session: 20250821000543
Implantable Pulse Generator Implant Date: 20220415

## 2023-10-22 ENCOUNTER — Ambulatory Visit (INDEPENDENT_AMBULATORY_CARE_PROVIDER_SITE_OTHER): Payer: Self-pay

## 2023-10-22 ENCOUNTER — Ambulatory Visit: Payer: Self-pay | Admitting: Cardiology

## 2023-10-22 DIAGNOSIS — I639 Cerebral infarction, unspecified: Secondary | ICD-10-CM

## 2023-10-22 LAB — CUP PACEART REMOTE DEVICE CHECK
Date Time Interrogation Session: 20250922000907
Implantable Pulse Generator Implant Date: 20220415

## 2023-10-23 NOTE — Progress Notes (Signed)
 Remote Loop Recorder Transmission

## 2023-11-05 NOTE — Progress Notes (Signed)
 Remote Loop Recorder Transmission

## 2023-11-23 ENCOUNTER — Ambulatory Visit: Attending: Cardiology

## 2023-11-23 DIAGNOSIS — I639 Cerebral infarction, unspecified: Secondary | ICD-10-CM

## 2023-11-24 LAB — CUP PACEART REMOTE DEVICE CHECK
Date Time Interrogation Session: 20251024000247
Implantable Pulse Generator Implant Date: 20220415

## 2023-11-27 ENCOUNTER — Encounter: Payer: Self-pay | Admitting: Urology

## 2023-11-27 ENCOUNTER — Ambulatory Visit: Admitting: Urology

## 2023-11-27 VITALS — BP 138/75 | HR 65 | Ht 69.0 in | Wt 204.0 lb

## 2023-11-27 DIAGNOSIS — R972 Elevated prostate specific antigen [PSA]: Secondary | ICD-10-CM

## 2023-11-27 NOTE — Progress Notes (Signed)
 Remote Loop Recorder Transmission

## 2023-11-28 ENCOUNTER — Ambulatory Visit: Payer: Self-pay | Admitting: Cardiology

## 2023-11-28 LAB — PSA: Prostate Specific Ag, Serum: 4.6 ng/mL — ABNORMAL HIGH (ref 0.0–4.0)

## 2023-11-30 ENCOUNTER — Ambulatory Visit: Payer: Self-pay | Admitting: Urology

## 2023-11-30 ENCOUNTER — Encounter: Payer: Self-pay | Admitting: Urology

## 2023-11-30 NOTE — Progress Notes (Signed)
 11/27/2023 7:54 PM   Thomas Sampson Dec 29, 1945 [id number]  Referring provider: Rudolpho Norleen BIRCH, MD [address] [facility] [place],  [place] [address]  Chief Complaint  Patient presents with   Elevated PSA    HPI: Thomas Sampson is a 77 y.o. male referred for evaluation of an elevated PSA.  PSA levels: 5.27 on 10/17/2023 Baseline PSA level: Mid 4 range Lower urinary tract symptoms: None Family history prostate cancer first-degree relatives: Negative Past urologic history: Negative  PSA trend    Prostate Specific Ag, Serum  Latest Ref Rng 0.0 - 4.0 ng/mL  09/20/2020 4.31  09/21/2021 4.68  10/03/2022 4.56  10/17/2023 5.27    PMH: Past Medical History:  Diagnosis Date   Acute right MCA stroke (HCC) 05/10/2020   Arthritis    Atelectasis    Back abscess    Basal cell carcinoma of nose    Cataract cortical, senile, bilateral    Chronic kidney disease, stage 4 (severe) (HCC)    Diverticulosis    GERD (gastroesophageal reflux disease)    History of kidney stones    Hyperlipidemia    Hypertension    Pneumonia    Seizure disorder (HCC) 09/2020   Tinea pedis    Type 2 diabetes mellitus without complication     Surgical History: Past Surgical History:  Procedure Laterality Date   APPENDECTOMY  2008   with bowel surgery   CATARACT EXTRACTION Left 09/2009   CATARACT EXTRACTION Right 05/2010   COLONOSCOPY     2003, 2008, 2011, 2013   COLONOSCOPY N/A 08/27/2023   Procedure: COLONOSCOPY;  Surgeon: Onita Elspeth Sharper, DO;  Location: [facility] ENDOSCOPY;  Service: Gastroenterology;  Laterality: N/A;   COLONOSCOPY WITH PROPOFOL  N/A 09/18/2016   Procedure: COLONOSCOPY WITH PROPOFOL ;  Surgeon: Gaylyn Gladis PENNER, MD;  Location: [facility] ENDOSCOPY;  Service: Endoscopy;  Laterality: N/A;   INCISION AND DRAINAGE ABSCESS N/A 07/10/2022   Procedure: INCISION AND DRAINAGE ABSCESS furuncle;  Surgeon: Tye Millet, DO;  Location: [facility] ORS;  Service: General;  Laterality: N/A;    IR CT HEAD LTD  05/10/2020   IR PERCUTANEOUS ART THROMBECTOMY/INFUSION INTRACRANIAL INC DIAG ANGIO  05/10/2020   LOOP RECORDER INSERTION N/A 05/14/2020   Procedure: LOOP RECORDER INSERTION;  Surgeon: Inocencio Soyla Gladis, MD;  Location: [facility] INVASIVE CV LAB;  Service: Cardiovascular;  Laterality: N/A;   PARTIAL COLECTOMY  2008   PILONIDAL CYST EXCISION  1970   POLYPECTOMY  08/27/2023   Procedure: POLYPECTOMY, INTESTINE;  Surgeon: Onita Elspeth Sharper, DO;  [facility] ENDOSCOPY;  Service: Gastroenterology;;   RADIOLOGY WITH ANESTHESIA N/A 05/10/2020   Procedure: IR WITH ANESTHESIA;  Surgeon: Radiologist, Medication, MD;  Location: [facility] OR;  Service: Radiology;  Laterality: N/A;   VASECTOMY      Home Medications:  Allergies as of 11/27/2023       Reactions   Codeine Nausea Only   Meperidine Nausea Only   Upset stomach        Medication List        Accurate as of November 27, 2023 11:59 PM. If you have any questions, ask your nurse or doctor.          acetaminophen  325 MG tablet Commonly known as: TYLENOL  Take 2 tablets (650 mg total) by mouth every 4 (four) hours as needed for mild pain (or temp > 37.5 C (99.5 F)).   Alpha-Lipoic Acid 300 MG Tabs Take 1 tablet by mouth 2 (two) times daily.   amLODipine  5 MG  tablet Commonly known as: NORVASC  Take 1 tablet (5 mg total) by mouth daily.   atorvastatin  40 MG tablet Commonly known as: LIPITOR Take 1 tablet (40 mg total) by mouth daily.   Calcium  Soft Chews 500-500-40 MG-UNT-MCG Chew Generic drug: Calcium -Vitamin D-Vitamin K Chew 1 tablet by mouth at bedtime.   Cholecalciferol  50 MCG (2000 UT) Caps Take 1 capsule by mouth daily.   cyanocobalamin  1000 MCG tablet Take 1 tablet (1,000 mcg total) by mouth daily.   diclofenac  Sodium 1 % Gel Commonly known as: VOLTAREN  Apply 2 g topically 4 (four) times daily as needed.   econazole nitrate 1 % cream Apply 1 Application topically daily as needed.   insulin  aspart  100 UNIT/ML FlexPen Commonly known as: NOVOLOG  Inject 8 Units into the skin 3 (three) times daily with meals.   insulin  glargine 100 UNIT/ML injection Commonly known as: LANTUS  Inject 30 Units into the skin at bedtime.   levETIRAcetam 750 MG tablet Commonly known as: KEPPRA Take 750 mg by mouth 2 (two) times daily.   lidocaine  5 % Commonly known as: LIDODERM  Place 1 patch onto the skin daily as needed. Remove & Discard patch within 12 hours or as directed by MD   losartan 25 MG tablet Commonly known as: COZAAR Take 25 mg by mouth daily.   MULTIPLE VITAMIN PO Take 1 tablet by mouth daily.   oxcarbazepine 600 MG tablet Commonly known as: TRILEPTAL Take 600 mg by mouth 2 (two) times daily.   pantoprazole  40 MG tablet Commonly known as: PROTONIX  Take 1 tablet (40 mg total) by mouth daily as needed.   pregabalin  25 MG capsule Commonly known as: LYRICA  Take 1 capsule (25 mg total) by mouth 2 (two) times daily.   sertraline 25 MG tablet Commonly known as: ZOLOFT Take 25 mg by mouth daily.   Xiidra 5 % Soln Generic drug: Lifitegrast Place 1 drop into both eyes 2 (two) times daily as needed.        Allergies:  Allergies  Allergen Reactions   Codeine Nausea Only   Meperidine Nausea Only    Upset stomach    Family History: Family History  Problem Relation Age of Onset   Cancer Mother    Heart attack Father    Heart attack Sister    Heart disease Sister    Diabetes Brother     Social History:  reports that he has quit smoking. His smoking use included cigarettes. He has never used smokeless tobacco. He reports that he does not drink alcohol and does not use drugs.   Physical Exam: BP 138/75   Pulse 65   Ht 5' 9 (1.753 m)   Wt 204 lb (92.5 kg)   BMI 30.13 kg/m   Constitutional:  Alert and oriented, No acute distress. HEENT: [place] AT Respiratory: Normal respiratory effort, no increased work of breathing. GU: Prostate 50 g smooth without  nodules Psychiatric: Normal mood and affect.   Assessment & Plan:    1.  Elevated PSA We discussed the American Urological Association does not recommend prostate cancer screening for men after age 58 routinely but can be extended age 67 for healthier patients.  We also discussed there are no organizations or societies that recommend prostate cancer screening after age 56 due to the low incidence of high-grade prostate cancer after age 53 DRE is benign and his PSA is normal by age-specific guidelines. Most recent PSA is above his baseline though an abnormal PSA velocity is based on 3  successive readings. Recommend repeating his PSA today and if back to baseline would discontinue prostate cancer screening If his PSA remains elevated above baseline we discussed prostate MRI versus surveillance.  He will think over these options   Glendia JAYSON Barba, MD  [facility] [address], [address] [place], [place] [address] [phone]

## 2023-11-30 NOTE — Patient Instructions (Signed)
Yeah!!!!

## 2023-12-24 ENCOUNTER — Ambulatory Visit

## 2023-12-24 DIAGNOSIS — I639 Cerebral infarction, unspecified: Secondary | ICD-10-CM

## 2023-12-25 LAB — CUP PACEART REMOTE DEVICE CHECK
Date Time Interrogation Session: 20251124000622
Implantable Pulse Generator Implant Date: 20220415

## 2023-12-25 NOTE — Progress Notes (Signed)
 Remote Loop Recorder Transmission

## 2023-12-26 ENCOUNTER — Ambulatory Visit: Payer: Self-pay | Admitting: Cardiology

## 2024-01-24 ENCOUNTER — Ambulatory Visit

## 2024-01-24 DIAGNOSIS — I639 Cerebral infarction, unspecified: Secondary | ICD-10-CM | POA: Diagnosis not present

## 2024-01-24 LAB — CUP PACEART REMOTE DEVICE CHECK
Date Time Interrogation Session: 20251225000156
Implantable Pulse Generator Implant Date: 20220415

## 2024-01-25 NOTE — Progress Notes (Signed)
 Remote Loop Recorder Transmission

## 2024-01-30 NOTE — Progress Notes (Signed)
 " History of Present Illness:   Thomas Sampson is a 78 y.o. male here for concern for a cyst over his left upper back.  He reports that it has occurred over the last 3 to 5 days.  He has had a similar symptom in the past, where he had to have Dr. Tye in general surgery excise it.  This was noticed because he was to have a joint injection in his left shoulder at [facility], but the provider aborted the procedure because of concern for infection.  It is not painful.  However, it is draining spontaneously.   Past Medical History:   Past Medical History:  Diagnosis Date   Arthritis    Chronic respiratory failure with hypoxia (CMS/HHS-HCC) 06/08/2021   Diverticulosis 09/18/2016   History of stroke    Hyperlipidemia    Hypertension    Kidney stone    Tinea pedis    Type 2 diabetes mellitus (CMS/HHS-HCC)    Type II or unspecified type diabetes mellitus without mention of complication, not stated as uncontrolled (CMS/HHS-HCC) 1995   last eye exam [facility] 04/2013    Past Surgical History:   Past Surgical History:  Procedure Laterality Date   CATARACT EXTRACTION  09/2009   Left eye   CATARACT EXTRACTION  05/2010   Right eye   COLONOSCOPY  09/18/2016   Diverticulosis/Negative for colitis/PHx CP/Repeat 3yrs/MUS  (08/25/2021 recall letter returned.awb)   I&D abscess -- furuncle  07/10/2022   Dr. Henriette Tye   [email or web address]   08/27/2023   TA/HP/NoRpt/SMR   COLECTOMY PARTIAL ABDOMINAL & TRANSANAL     COLONOSCOPY  03/01/01; 06/07/06; 02/25/09; 04/17/11   Hyperplastic    Allergies:   Allergies  Allergen Reactions   Codeine Vomiting and Nausea   Meperidine Nausea    Upset stomach    Current Medications:   Prior to Admission medications  Medication Sig Taking? Last Dose  alpha lipoic acid 300 mg capsule Take 600 mg by mouth 2 (two) times daily Yes Taking  amLODIPine  (NORVASC ) 5 MG tablet Take 1 tablet (5 mg total) by mouth once daily Yes Taking  atorvastatin   (LIPITOR) 40 MG tablet Take 1 tablet (40 mg total) by mouth once daily Yes Taking  calcium  carb/vitamin D3/vit K1 (SOFT CHEWS CALCIUM  ORAL) Take by mouth Yes Taking  cholecalciferol  (VITAMIN D3) 2,000 unit capsule Take 2,000 Units by mouth once daily. Yes Taking  clopidogreL  (PLAVIX ) 75 mg tablet Take 1 tablet (75 mg total) by mouth once daily Yes Taking  cyanocobalamin  (VITAMIN B12) 1000 MCG tablet Take 1,000 mcg by mouth once daily    Yes Taking  insulin  ASPART (NOVOLOG  FLEXPEN U-100 INSULIN ) pen injector (concentration 100 units/mL) Inject up to 50 units daily as directed Yes Taking  insulin  GLARGINE (LANTUS  SOLOSTAR U-100 INSULIN ) pen injector (concentration 100 units/mL) Inject up to 50 units daily as directed Yes Taking  JARDIANCE 10 mg tablet  Yes Taking  lidocaine  (LIDODERM ) 5 % patch Place 1 patch onto the skin once daily Apply patch to the most painful area for up to 12 hours in a 24 hour period. Yes Taking  losartan (COZAAR) 25 MG tablet  Yes Taking  losartan (COZAAR) 25 MG tablet Take 25 mg by mouth once daily Yes Taking  multivitamin tablet Take 1 tablet by mouth once daily Yes Taking  pantoprazole  (PROTONIX ) 40 MG DR tablet Take 1 tablet (40 mg total) by mouth once daily Yes Taking  pregabalin  (LYRICA ) 25 MG capsule Take 1 capsule (25  mg total) by mouth 2 (two) times daily Yes Taking  sertraline (ZOLOFT) 25 MG tablet Take 1 tablet (25 mg total) by mouth once daily Yes Taking  BD ULTRA-FINE NANO PEN NEEDLE 32 gauge x 5/32 Ndle Use 1 each four times daily. Patient not taking: Reported on 01/30/2024  Not Taking  blood glucose diagnostic (ONETOUCH ULTRA TEST) test strip 1 each (1 strip total) 3 (three) times daily Use as instructed. Patient not taking: Reported on 08/21/2023  Not Taking  blood glucose meter kit as directed Patient not taking: Reported on 12/12/2021    doxycycline (VIBRAMYCIN) 100 MG capsule Take 1 capsule (100 mg total) by mouth 2 (two) times daily for 7 days     levETIRAcetam (KEPPRA) 750 MG tablet Take 750 mg by mouth 2 (two) times daily    OXcarbazepine (TRILEPTAL) 600 MG tablet Take 600 mg by mouth 2 (two) times daily    sodium, potassium, and magnesium  (SUPREP) oral solution Take 1 Bottle by mouth as directed One kit contains 2 bottles.  Take both bottles at the times instructed by your provider. Patient not taking: Reported on 08/21/2023  Not Taking    Family History:   Family History  Problem Relation Name Age of Onset   Breast cancer Mother     Cancer Mother     Heart disease Father     Myocardial Infarction (Heart attack) Father     Breast cancer Sister     Cancer Sister     Diabetes type II Brother     Heart disease Brother     Diabetes Brother     Myocardial Infarction (Heart attack) Brother     No Known Problems Maternal Grandmother     No Known Problems Maternal Grandfather     No Known Problems Paternal Grandmother     No Known Problems Paternal Grandfather     No Known Problems Son      Social History:   Social History   Socioeconomic History   Marital status: Married    Spouse name: Erminio   Number of children: 1   Years of education: 12  Occupational History   Occupation: Retired & works part time  Tobacco Use   Smoking status: Former    Current packs/day: 0.00    Types: Cigarettes    Quit date: 1975    Years since quitting: 51.0   Smokeless tobacco: Never  Vaping Use   Vaping status: Never Used  Substance and Sexual Activity   Alcohol use: No    Alcohol/week: 0.0 standard drinks of alcohol   Drug use: No   Sexual activity: Defer    Partners: Female   Social Drivers of Corporate Investment Banker Strain: Low Risk (10/29/2020)   Received from [facility]   Overall Financial Resource Strain (CARDIA)    Difficulty of Paying Living Expenses: Not very hard  Food Insecurity: No Food Insecurity (10/29/2020)   Received from [facility]   Hunger Vital Sign    Within the  past 12 months, you worried that your food would run out before you got the money to buy more.: Never true    Within the past 12 months, the food you bought just didn't last and you didn't have money to get more.: Never true  Transportation Needs: No Transportation Needs (11/19/2020)   Received from [facility]   PRAPARE - Transportation    Lack of Transportation (Medical): No    Lack of Transportation (Non-Medical):  No  Housing Stability: Unknown (10/17/2023)   Housing Stability Vital Sign    Homeless in the Last Year: No    Review of Systems:   Review of Systems  Constitutional:  Negative for chills, fatigue, fever and unexpected weight change.  HENT:  Negative for ear pain, rhinorrhea and sore throat.   Eyes:  Negative for pain and visual disturbance.  Respiratory:  Negative for cough, chest tightness and shortness of breath.   Cardiovascular:  Negative for chest pain, palpitations and leg swelling.  Gastrointestinal:  Negative for abdominal pain, constipation and diarrhea.  Genitourinary:  Negative for frequency and hematuria.  Musculoskeletal:  Negative for arthralgias, joint swelling and myalgias.  Skin:  Positive for wound. Negative for rash.  Neurological:  Negative for headaches.  All other systems reviewed and are negative.   Vitals:   Vitals:   01/30/24 1043  BP: (!) 160/82  Pulse: 75  Temp: 36.7 C (98 F)  TempSrc: Oral  SpO2: 96%  Weight: 93.9 kg (207 lb)  Height: 175.3 cm (5' 9)     Body mass index is 30.57 kg/m.  Physical Exam:   Physical Exam Vitals and nursing note reviewed.  Constitutional:      Appearance: He is well-developed.  HENT:     Head: Atraumatic.  Eyes:     Conjunctiva/sclera: Conjunctivae normal.     Pupils: Pupils are equal, round, and reactive to light.  Cardiovascular:     Rate and Rhythm: Normal rate and regular rhythm.     Heart sounds: Normal heart sounds.  Pulmonary:     Effort: Pulmonary effort is normal.      Breath sounds: Normal breath sounds.  Abdominal:     Comments: Gastrointestinal system examined as above.  Musculoskeletal:     Left shoulder: Swelling and tenderness present.       Arms:     Comments:    Skin:    General: Skin is warm and dry.     Findings: Abscess present.         Comments: Inflamed/infected sebaceous cyst over the left upper back just above the shoulder blade.  Spontaneously draining purulent fluid and sebum.  More sebum expressed with manual manipulation.  Neurological:     Mental Status: He is alert and oriented to person, place, and time.  Psychiatric:        Behavior: Behavior normal.     Assessment and Plan:  No results found for this visit on 01/30/24.  Diagnoses and all orders for this visit:  Infected sebaceous cyst  Other orders -     doxycycline (VIBRAMYCIN) 100 MG capsule; Take 1 capsule (100 mg total) by mouth 2 (two) times daily for 7 days    Patient Instructions  As we discussed, this is an infected sebaceous cyst.  This is an infection of a hair follicle gland. Take medications as directed. Return to care should your symptoms not improve, or please present to the nearest Emergency Department should your symptoms change or worsen in any way.  May apply mild heat.   Portions of this note were created using dictation software and may contain typographical errors.   Patient received an After Visit Summary    "

## 2024-02-02 ENCOUNTER — Ambulatory Visit: Payer: Self-pay | Admitting: Cardiology

## 2024-02-18 NOTE — Progress Notes (Signed)
 Cc:  Epidermal cyst [L72.0]  HPI:  Pt returns today for Epidermal cyst [L72.0].  Risks/benefits/alternatives discussed again and pt agrees to proceed.  ROS:  A 15 point review of systems was performed and was negative except as noted in HPI   Objective:     There were no vitals taken for this visit.  Constitutional :  alert, appears stated age, cooperative, and no distress  Musculoskeletal: Steady gait and movement  Skin: Epidermal cyst, back, no change  Psychiatric: Normal affect, non-agitated, not confused    Assessment:       Epidermal cyst [L72.0] Plan:     1) Epidermal cyst [L72.0]  procedure noted as below.   Pre-Op Dx: Epidermal cyst [L72.0] Post-Op Dx: same Anesthesia: local EBL: minimal Complications:  none apparent  Procedure: incision and drainage of back Epidermal cyst [L72.0]  Description of Procedure:  Consent obtained, time out performed.  Patient placed in prone position.  Area sterilized and draped in usual position.  Local infused to area previously marked.  3 cm incision made through dermis with 15blade and abscess cavity noted in subcutaneous layer. Small portion of possible capsule like structure removed but overall cavity more consistent with an abscess cavity.  The 2cm x 2cm x 2cm cavity Wound hemostasis noted, then packed with corner of 4x4, secured with tape. Pt tolerated procedure well.  F/u one week for wound check.  Packing changes in the meantime.  Restart plavix  in 2 days

## 2024-02-24 ENCOUNTER — Ambulatory Visit: Attending: Cardiology

## 2024-02-24 DIAGNOSIS — I639 Cerebral infarction, unspecified: Secondary | ICD-10-CM | POA: Diagnosis not present

## 2024-02-25 LAB — CUP PACEART REMOTE DEVICE CHECK
Date Time Interrogation Session: 20260125000549
Implantable Pulse Generator Implant Date: 20220415

## 2024-02-26 ENCOUNTER — Ambulatory Visit: Payer: Self-pay | Admitting: Cardiology

## 2024-02-28 NOTE — Progress Notes (Signed)
 Remote Loop Recorder Transmission

## 2024-03-26 ENCOUNTER — Ambulatory Visit

## 2024-04-26 ENCOUNTER — Ambulatory Visit
